# Patient Record
Sex: Female | Born: 1955 | Race: White | Hispanic: No | Smoking: Never smoker
Health system: Southern US, Community
[De-identification: ages and names within clinical notes are randomized; demographics above are authoritative.]

## PROBLEM LIST (undated history)

## (undated) DIAGNOSIS — R519 Headache, unspecified: Secondary | ICD-10-CM

## (undated) DIAGNOSIS — C569 Malignant neoplasm of unspecified ovary: Secondary | ICD-10-CM

## (undated) DIAGNOSIS — H33031 Retinal detachment with giant retinal tear, right eye: Secondary | ICD-10-CM

## (undated) DIAGNOSIS — Z8041 Family history of malignant neoplasm of ovary: Secondary | ICD-10-CM

## (undated) DIAGNOSIS — H269 Unspecified cataract: Secondary | ICD-10-CM

## (undated) DIAGNOSIS — T7840XA Allergy, unspecified, initial encounter: Secondary | ICD-10-CM

## (undated) DIAGNOSIS — G43909 Migraine, unspecified, not intractable, without status migrainosus: Secondary | ICD-10-CM

## (undated) DIAGNOSIS — E0590 Thyrotoxicosis, unspecified without thyrotoxic crisis or storm: Secondary | ICD-10-CM

## (undated) DIAGNOSIS — R188 Other ascites: Secondary | ICD-10-CM

## (undated) DIAGNOSIS — R51 Headache: Secondary | ICD-10-CM

## (undated) DIAGNOSIS — Z5189 Encounter for other specified aftercare: Secondary | ICD-10-CM

## (undated) DIAGNOSIS — C44622 Squamous cell carcinoma of skin of right upper limb, including shoulder: Secondary | ICD-10-CM

## (undated) HISTORY — DX: Malignant neoplasm of unspecified ovary: C56.9

## (undated) HISTORY — DX: Family history of malignant neoplasm of ovary: Z80.41

## (undated) HISTORY — DX: Squamous cell carcinoma of skin of right upper limb, including shoulder: C44.622

## (undated) HISTORY — DX: Migraine, unspecified, not intractable, without status migrainosus: G43.909

## (undated) HISTORY — DX: Allergy, unspecified, initial encounter: T78.40XA

## (undated) HISTORY — DX: Encounter for other specified aftercare: Z51.89

## (undated) HISTORY — PX: OTHER SURGICAL HISTORY: SHX169

## (undated) HISTORY — DX: Unspecified cataract: H26.9

## (undated) HISTORY — DX: Retinal detachment with giant retinal tear, right eye: H33.031

---

## 1998-03-27 HISTORY — PX: BREAST ENHANCEMENT SURGERY: SHX7

## 1999-03-28 HISTORY — PX: AUGMENTATION MAMMAPLASTY: SUR837

## 2004-03-27 DIAGNOSIS — C44622 Squamous cell carcinoma of skin of right upper limb, including shoulder: Secondary | ICD-10-CM

## 2004-03-27 HISTORY — DX: Squamous cell carcinoma of skin of right upper limb, including shoulder: C44.622

## 2008-03-27 DIAGNOSIS — H33031 Retinal detachment with giant retinal tear, right eye: Secondary | ICD-10-CM

## 2008-03-27 HISTORY — DX: Retinal detachment with giant retinal tear, right eye: H33.031

## 2012-03-04 ENCOUNTER — Ambulatory Visit: Payer: BC Managed Care – PPO | Admitting: Family Medicine

## 2012-03-04 ENCOUNTER — Ambulatory Visit: Payer: BC Managed Care – PPO

## 2012-03-04 VITALS — BP 106/71 | HR 103 | Temp 97.9°F | Resp 16 | Ht 68.0 in | Wt 144.0 lb

## 2012-03-04 DIAGNOSIS — R0600 Dyspnea, unspecified: Secondary | ICD-10-CM

## 2012-03-04 DIAGNOSIS — R059 Cough, unspecified: Secondary | ICD-10-CM

## 2012-03-04 DIAGNOSIS — J45909 Unspecified asthma, uncomplicated: Secondary | ICD-10-CM

## 2012-03-04 DIAGNOSIS — R0609 Other forms of dyspnea: Secondary | ICD-10-CM

## 2012-03-04 DIAGNOSIS — R0989 Other specified symptoms and signs involving the circulatory and respiratory systems: Secondary | ICD-10-CM

## 2012-03-04 DIAGNOSIS — R05 Cough: Secondary | ICD-10-CM

## 2012-03-04 MED ORDER — AZITHROMYCIN 250 MG PO TABS: ORAL_TABLET | ORAL | Status: DC

## 2012-03-04 MED ORDER — PREDNISONE 20 MG PO TABS: ORAL_TABLET | ORAL | Status: DC

## 2012-03-04 MED ORDER — ALBUTEROL SULFATE HFA 108 (90 BASE) MCG/ACT IN AERS: INHALATION_SPRAY | RESPIRATORY_TRACT | Status: DC

## 2012-03-04 MED ORDER — ALBUTEROL SULFATE (2.5 MG/3ML) 0.083% IN NEBU
2.5000 mg | INHALATION_SOLUTION | Freq: Once | RESPIRATORY_TRACT | Status: AC
  Administered 2012-03-04: 2.5 mg via RESPIRATORY_TRACT

## 2012-03-04 NOTE — Patient Instructions (Signed)
Drink plenty of fluids  Avoid smoky testing environments  Use the inhaler 2 puffs every 4-6 hours as directed. Try to remember how instructed you to use it.  Take the prednisone in the mornings in a tapered dose fashion as ordered  If abruptly worse at anytime go to the emergency room or return here

## 2012-03-04 NOTE — Progress Notes (Addendum)
Subjective: 56 year old lady with a history of a respiratory problem over the last 2 months. She's had a persistent cough and shortness of breath. She seems to do a little bit better than gets worse again. She's not been able to be very physically because of the shortness of breath. She coughs up phlegm, but it is usually not purulent looking. She does ride a bicycle, but has not been able to do so because she is not feeling well. Her roommate has not been ill. Her coworkers have had the problem have gotten over it. She has not. She is a nonsmoker, and has otherwise been pretty healthy.  Objective: O2 sat was 94%. She does keep coughing while she is sitting here in the exam room. Her TMs are normal. Throat clear. Neck supple without significant nodes. Chest has somewhat diminished air exchange. There is in expiratory wheeze. Heart was regular without any murmurs. No rhonchi or rales were noted.  Assessment: Asthmatic bronchitis  Plan: Peak flow on her and she did to 290, 300, 290. Albuterol treatment Chest x-ray  The nebulizer helped. She agreed to a peak flow of 350. Her chest is not wheezing.  Will treat for the asthmatic bronchitis.   03/07/12 Failed to view xray yesterday when I treated her.  [facility] reading (PRIMARY) by  Dr. Alwyn Ren Bilateral pleural effusions.  Await radiology reading..  I tried to call the patient several times tonight, and left a message on his machine. SHe needs to her about the abnormal chest x-ray. I think she needs to be referred to a pulmonologist.  .

## 2012-03-06 ENCOUNTER — Ambulatory Visit (INDEPENDENT_AMBULATORY_CARE_PROVIDER_SITE_OTHER): Payer: BC Managed Care – PPO | Admitting: Family Medicine

## 2012-03-06 ENCOUNTER — Ambulatory Visit: Payer: BC Managed Care – PPO

## 2012-03-06 VITALS — BP 108/76 | HR 92 | Temp 97.4°F | Resp 18 | Ht 68.0 in | Wt 143.6 lb

## 2012-03-06 DIAGNOSIS — J90 Pleural effusion, not elsewhere classified: Secondary | ICD-10-CM

## 2012-03-06 DIAGNOSIS — J40 Bronchitis, not specified as acute or chronic: Secondary | ICD-10-CM

## 2012-03-06 NOTE — Patient Instructions (Addendum)
See Dr. Sherene Sires tomorrow at his office as scheduled.

## 2012-03-06 NOTE — Progress Notes (Signed)
Subjective: Patient is in here for a followup from her visit 2 days ago. She came in with shortness of breath gradually worsening over the past 2 months. She is normally been a very healthy lady. Not any other major illnesses. No major surgery with bilateral breast implants. She is a nonsmoker, though she has lived with smokers. She's not had any chest pain, but when she bends forward she feels more short of breath. She feels like she's had a lot of mucus. I called her to inform her that a chest x-ray showed bilateral pleural effusions. I was tied up with an emergency at the time that I saw her 2 days ago, and has failed to look at her chest x-ray at the time that it was taken. She's felt a little bit better on the medications, which include Zithromax, prednisone, and albuterol inhaler.  Objective: Pleasant healthy-appearing constantly clearing her throat. Chest is not woozy today but has diminished breath sounds at the bases. Heart regular without murmurs.  [facility] reading (PRIMARY) by  Dr. Alwyn Ren Bilateral pleural effusion  .  Assessment: Bilateral pleural effusion etiology unclear  Plan: Spoke with Dr. Delford Field, pulmonologist, and he will arrange an appointment for her to see Dr. Sherene Sires tomorrow.

## 2012-03-07 ENCOUNTER — Ambulatory Visit (INDEPENDENT_AMBULATORY_CARE_PROVIDER_SITE_OTHER): Payer: BC Managed Care – PPO | Admitting: Internal Medicine

## 2012-03-07 ENCOUNTER — Other Ambulatory Visit (INDEPENDENT_AMBULATORY_CARE_PROVIDER_SITE_OTHER): Payer: BC Managed Care – PPO

## 2012-03-07 ENCOUNTER — Encounter: Payer: Self-pay | Admitting: Internal Medicine

## 2012-03-07 VITALS — BP 98/60 | HR 91 | Temp 98.0°F | Ht 67.0 in | Wt 148.0 lb

## 2012-03-07 DIAGNOSIS — J90 Pleural effusion, not elsewhere classified: Secondary | ICD-10-CM

## 2012-03-07 DIAGNOSIS — E039 Hypothyroidism, unspecified: Secondary | ICD-10-CM | POA: Insufficient documentation

## 2012-03-07 LAB — BASIC METABOLIC PANEL
BUN: 20 mg/dL (ref 6–23)
Chloride: 103 mEq/L (ref 96–112)
Glucose, Bld: 85 mg/dL (ref 70–99)
Potassium: 4.5 mEq/L (ref 3.5–5.1)

## 2012-03-07 LAB — CBC WITH DIFFERENTIAL/PLATELET
Basophils Absolute: 0 10*3/uL (ref 0.0–0.1)
Eosinophils Absolute: 0 10*3/uL (ref 0.0–0.7)
HCT: 38.8 % (ref 36.0–46.0)
Hemoglobin: 13.1 g/dL (ref 12.0–15.0)
Lymphs Abs: 2 10*3/uL (ref 0.7–4.0)
MCHC: 33.7 g/dL (ref 30.0–36.0)
Neutro Abs: 6.5 10*3/uL (ref 1.4–7.7)
RDW: 14.6 % (ref 11.5–14.6)

## 2012-03-07 LAB — HEPATIC FUNCTION PANEL
AST: 22 U/L (ref 0–37)
Albumin: 4.1 g/dL (ref 3.5–5.2)

## 2012-03-07 LAB — TSH: TSH: 5.96 u[IU]/mL — ABNORMAL HIGH (ref 0.35–5.50)

## 2012-03-07 MED ORDER — TRAMADOL HCL 50 MG PO TABS: ORAL_TABLET | ORAL | Status: DC

## 2012-03-07 NOTE — Assessment & Plan Note (Signed)
TSH 5.96 03/07/2012 > start synthroid 50 mcg daily

## 2012-03-07 NOTE — Patient Instructions (Addendum)
Take delsym two tsp every 12 hours and supplement if needed with  tramadol 50 mg up to 1 every 4 hours to suppress the urge to cough. Swallowing water or using ice chips/non mint and menthol containing candies (such as lifesavers or sugarless jolly ranchers) are also effective.  You should rest your voice and avoid activities that you know make you cough.  Once you have eliminated the cough for 3 straight days try reducing the tramadol first,  then the delsym as tolerated.    Please remember to go to the lab and x-ray department downstairs for your tests - we will call you with the results when they are available.     Late add tsh 5.96 > rx synthroid 50 mcg per day

## 2012-03-07 NOTE — Assessment & Plan Note (Signed)
-   ESR 15/ bnp 75/TSH 5.96 > doubt she is hypothyroid enough to cause bilateral effusions but it is in the ddx  Discussed in detail all the  indications, usual  risks and alternatives  relative to the benefits with patient who agrees to proceed with tap the larger side and in meantime start synthroid 50 mcg daily

## 2012-03-07 NOTE — Progress Notes (Signed)
  Subjective:    Patient ID: Shelley Sanchez, female    DOB: [birth year]  MRN: [id number]  HPI  72 yowf never smoker with excellent baseline activity tolerance able to cross biking in Sept of 2013  referred to pulmonary clinic 03/07/2012 by Dr Alwyn Ren at [facility] for eval of cough and abn cxr with bilateral effusions  03/07/2012 1st pulmonary eval cc new onset around 10th oct 2013  fatigue,  Severe cough > clear mucus then back and R side cp.  Symptoms have progressed minimally. No classic orthopnea or pnd or any leg swelling, min doe though not very active.  rx with albuterol, zmax and prednisone x 2 days prior to OV  > no better  Sleeping ok without nocturnal  or early am exacerbation  of respiratory  c/o's or need for noct saba. Also denies any obvious fluctuation of symptoms with weather or environmental changes or other aggravating or alleviating factors except as outlined above     Review of Systems  Constitutional: Negative for fever, chills and unexpected weight change.  HENT: Negative for ear pain, nosebleeds, congestion, sore throat, rhinorrhea, sneezing, trouble swallowing, dental problem, voice change, postnasal drip and sinus pressure.   Eyes: Negative for visual disturbance.  Respiratory: Positive for cough and shortness of breath. Negative for choking.   Cardiovascular: Positive for chest pain. Negative for leg swelling.  Gastrointestinal: Negative for vomiting, abdominal pain and diarrhea.  Genitourinary: Negative for difficulty urinating.  Musculoskeletal: Negative for arthralgias.  Skin: Negative for rash.  Neurological: Negative for tremors, syncope and headaches.  Hematological: Does not bruise/bleed easily.       Objective:   Physical Exam  amb wf nad   Wt Readings from Last 3 Encounters:  03/07/12 148 lb (67.132 kg)  03/06/12 143 lb 9.6 oz (65.137 kg)  03/04/12 144 lb (65.318 kg)    HEENT: nl dentition, turbinates, and orophanx. Nl external ear canals without cough  reflex   NECK :  without JVD/Nodes/TM/ nl carotid upstrokes bilaterally   LUNGS: no acc muscle use, clear to A and P bilaterally without cough on insp or exp maneuvers, min dullness in both bases, no wheeze at all   CV:  RRR  no s3 or murmur or increase in P2, no edema   ABD:  soft and nontender with nl excursion in the supine position. No bruits or organomegaly, bowel sounds nl  MS:  warm without deformities, calf tenderness, cyanosis or clubbing  SKIN: warm and dry without lesions    NEURO:  alert, approp, no deficits   cxr 03/06/12  ? Bilateral effusions     Assessment & Plan:

## 2012-03-08 ENCOUNTER — Other Ambulatory Visit: Payer: Self-pay | Admitting: Internal Medicine

## 2012-03-08 ENCOUNTER — Telehealth: Payer: Self-pay | Admitting: Internal Medicine

## 2012-03-08 DIAGNOSIS — J90 Pleural effusion, not elsewhere classified: Secondary | ICD-10-CM

## 2012-03-08 MED ORDER — LEVOTHYROXINE SODIUM 50 MCG PO TABS: 50.0000 ug | ORAL_TABLET | Freq: Every day | ORAL | Status: DC

## 2012-03-08 NOTE — Telephone Encounter (Signed)
Per 12.12.13 lab results:  Result Notes     Notes Recorded by Christen Butter, CMA on 03/07/2012 at 5:10 PM [facility] ------  Notes Recorded by Nyoka Cowden, MD on 03/07/2012 at 5:01 PM Call patient : Studies are unremarkable x tsh high so thyroid not adequate > rx synthroid 50 mcg per day and recheck in one month Also turned in request for IR to tap the larger of the two sides for relief of sob and to shed further light on cause of fluid   Called spoke with patient, advised of lab results/recs as stated by MW.  Pt okay with these recommendations and verbalized her understanding.  synthroid sent to verified pharmacy.  Pt also asked about her thoracentesis on 12.16.13 > where to check, arrival time, etc > PCCs unavailable to ask.  Per pt can call back and leave detailed message.  Spoke with Bjorn Loser [facility] > check in at [facility] dept; arrive at least 15 mins prior, may drive herself and can eat a light breakfast.  Detailed message left as per pt's request and advised her to please call if anything further is needed.

## 2012-03-11 ENCOUNTER — Ambulatory Visit (HOSPITAL_COMMUNITY)
Admission: RE | Admit: 2012-03-11 | Discharge: 2012-03-11 | Disposition: A | Discharge status: Home / Self Care | Payer: BC Managed Care – PPO | Source: Ambulatory Visit | Attending: Internal Medicine | Admitting: Internal Medicine

## 2012-03-11 ENCOUNTER — Encounter: Payer: Self-pay | Admitting: Internal Medicine

## 2012-03-11 ENCOUNTER — Ambulatory Visit (HOSPITAL_COMMUNITY)
Admission: RE | Admit: 2012-03-11 | Discharge: 2012-03-11 | Disposition: A | Discharge status: Home / Self Care | Payer: BC Managed Care – PPO | Source: Ambulatory Visit | Attending: Radiology | Admitting: Radiology

## 2012-03-11 VITALS — BP 97/68

## 2012-03-11 DIAGNOSIS — R0989 Other specified symptoms and signs involving the circulatory and respiratory systems: Secondary | ICD-10-CM | POA: Insufficient documentation

## 2012-03-11 DIAGNOSIS — J90 Pleural effusion, not elsewhere classified: Secondary | ICD-10-CM

## 2012-03-11 DIAGNOSIS — R0609 Other forms of dyspnea: Secondary | ICD-10-CM | POA: Insufficient documentation

## 2012-03-11 LAB — BODY FLUID CELL COUNT WITH DIFFERENTIAL
Eos, Fluid: 0 %
Lymphs, Fluid: 63 %
Neutrophil Count, Fluid: 4 % (ref 0–25)
Other Cells, Fluid: 0 %

## 2012-03-11 LAB — GLUCOSE, SEROUS FLUID: Glucose, Fluid: 108 mg/dL

## 2012-03-11 LAB — PROTEIN, BODY FLUID: Total protein, fluid: 5 g/dL

## 2012-03-11 NOTE — Procedures (Signed)
US guided diagnostic/therapeutic right thoracentesis performed yielding 1.6 liters turbid, amber fluid. A portion of the fluid was sent to the lab for preordered studies. F/u CXR pending. No immediate complications.  

## 2012-03-12 ENCOUNTER — Encounter: Payer: Self-pay | Admitting: Internal Medicine

## 2012-03-18 ENCOUNTER — Ambulatory Visit (INDEPENDENT_AMBULATORY_CARE_PROVIDER_SITE_OTHER)
Admission: RE | Admit: 2012-03-18 | Discharge: 2012-03-18 | Disposition: A | Discharge status: Home / Self Care | Payer: BC Managed Care – PPO | Source: Ambulatory Visit | Attending: Internal Medicine | Admitting: Internal Medicine

## 2012-03-18 ENCOUNTER — Ambulatory Visit (INDEPENDENT_AMBULATORY_CARE_PROVIDER_SITE_OTHER): Payer: BC Managed Care – PPO | Admitting: Internal Medicine

## 2012-03-18 ENCOUNTER — Encounter: Payer: Self-pay | Admitting: Internal Medicine

## 2012-03-18 VITALS — BP 100/60 | HR 92 | Temp 97.3°F | Ht 67.0 in | Wt 145.0 lb

## 2012-03-18 DIAGNOSIS — J90 Pleural effusion, not elsewhere classified: Secondary | ICD-10-CM

## 2012-03-18 DIAGNOSIS — E039 Hypothyroidism, unspecified: Secondary | ICD-10-CM

## 2012-03-18 NOTE — Patient Instructions (Addendum)
Please schedule a follow up office visit in 4 weeks, sooner if needed with cxr and recheck TSH - if breathing gets worse in meantime we can arrange for you to have the fluid in the right lung removed

## 2012-03-18 NOTE — Progress Notes (Signed)
  Subjective:    Patient ID: Shelley Sanchez, female    DOB: Apr 29, 1955  MRN: [id number]  HPI  24 yowf never smoker with excellent baseline activity tolerance able to cross biking in Sept of 2013  referred to pulmonary clinic 03/07/2012 by Dr Alwyn Ren at [facility] for eval of cough and abn cxr with bilateral effusions  03/07/2012 1st pulmonary eval cc new onset around 10th oct 2013  fatigue,  Severe cough > clear mucus then back and R side cp.  Symptoms have progressed minimally. No classic orthopnea or pnd or any leg swelling, min doe though not very active.  rx with albuterol, zmax and prednisone x 2 days prior to OV  > no better  rec R thoracentesis > 1.6 liter exudate/ monos/ neg cyt/ tsh slt elevated > rx synthoid 50 mcg per day  03/18/2012 f/u ov/Korion Cuevas cc still fatigue,  Cough much better, mostly in ams   Sleeping ok without nocturnal  or early am exacerbation  of respiratory  c/o's or need for noct saba. Also denies any obvious fluctuation of symptoms with weather or environmental changes or other aggravating or alleviating factors except as outlined above          Objective:   Physical Exam  amb wf nad   Wt 145 03/18/2012  Wt Readings from Last 3 Encounters:  03/07/12 148 lb (67.132 kg)  03/06/12 143 lb 9.6 oz (65.137 kg)  03/04/12 144 lb (65.318 kg)    HEENT: nl dentition, turbinates, and orophanx. Nl external ear canals without cough reflex   NECK :  without JVD/Nodes/TM/ nl carotid upstrokes bilaterally   LUNGS: no acc muscle use, clear to A and P bilaterally without cough on insp or exp maneuvers, min dullness in both bases, no wheeze at all   CV:  RRR  no s3 or murmur or increase in P2, no edema   ABD:  soft and nontender with nl excursion in the supine position. No bruits or organomegaly, bowel sounds nl  MS:  warm without deformities, calf tenderness, cyanosis or clubbing  SKIN: warm and dry without lesions    NEURO:  alert, approp, no deficits     CXR   03/18/2012 : Persistent bilateral pleural effusions with the left effusion  having decreased somewhat in volume. Bibasilar atelectasis.        Assessment & Plan:

## 2012-03-19 NOTE — Assessment & Plan Note (Signed)
TSH 5.96 03/07/2012 > started synthroid 50 mcg daily   Recheck due in 4 weeks

## 2012-03-19 NOTE — Assessment & Plan Note (Signed)
-   ESR 15/ bnp 75 03/07/2012  - Thoracentesis  R 03/07/2012 > 1.6 liters of turbid,amber fluid/ exudative/monos> cytology neg  cxr clearly improved even on L side with hypothyroidism all that turned up ? Related   Discussed in detail all the  indications, usual  risks and alternatives  relative to the benefits with patient who agrees to proceed with conservative f/u.

## 2012-03-27 DIAGNOSIS — R188 Other ascites: Secondary | ICD-10-CM

## 2012-03-27 HISTORY — DX: Other ascites: R18.8

## 2012-03-28 ENCOUNTER — Encounter: Payer: Self-pay | Admitting: Pulmonary Disease

## 2012-03-28 ENCOUNTER — Other Ambulatory Visit (INDEPENDENT_AMBULATORY_CARE_PROVIDER_SITE_OTHER): Payer: BC Managed Care – PPO

## 2012-03-28 ENCOUNTER — Ambulatory Visit (INDEPENDENT_AMBULATORY_CARE_PROVIDER_SITE_OTHER)
Admission: RE | Admit: 2012-03-28 | Discharge: 2012-03-28 | Disposition: A | Discharge status: Home / Self Care | Payer: BC Managed Care – PPO | Source: Ambulatory Visit | Attending: Pulmonary Disease | Admitting: Pulmonary Disease

## 2012-03-28 ENCOUNTER — Ambulatory Visit (INDEPENDENT_AMBULATORY_CARE_PROVIDER_SITE_OTHER): Payer: BC Managed Care – PPO | Admitting: Pulmonary Disease

## 2012-03-28 ENCOUNTER — Telehealth: Payer: Self-pay | Admitting: Internal Medicine

## 2012-03-28 VITALS — BP 110/62 | HR 124 | Temp 97.2°F | Ht 67.0 in | Wt 146.4 lb

## 2012-03-28 DIAGNOSIS — J90 Pleural effusion, not elsewhere classified: Secondary | ICD-10-CM

## 2012-03-28 LAB — COMPREHENSIVE METABOLIC PANEL
ALT: 10 U/L (ref 0–35)
CO2: 29 mEq/L (ref 19–32)
Creatinine, Ser: 0.8 mg/dL (ref 0.4–1.2)
GFR: 80.84 mL/min (ref >60.00)
Total Bilirubin: 0.7 mg/dL (ref 0.3–1.2)

## 2012-03-28 NOTE — Telephone Encounter (Signed)
Spoke with pt She reports having increased SOB and chest congestion since last visit Was unable to work today b/c so SOB OV with RA this am at 12 noon

## 2012-03-28 NOTE — Patient Instructions (Signed)
CT chest & abdomen Blood work

## 2012-03-28 NOTE — Assessment & Plan Note (Signed)
Recurrent BL effusions - lymphocytic exudate, no evidence of heart failure or systemic inflammation DD includes Serositis & ovarian tumor given her vague abdominal complaints. Will pursue CT chest/ abdomen-pelvis with PO contrast  & inflammatory markers ANA, RA factor - note no arthritis or ski rash to suggest lupus here. Imaging will also help to assess for pericarditis & ovarian pathology if any. FU in a few days or sooner,  if symptoms get worse

## 2012-03-28 NOTE — Progress Notes (Signed)
  Subjective:    Patient ID: Shelley Sanchez, female    DOB: 09-24-1955, 57 y.o.   MRN: [id number]  HPI 61 yowf never smoker with excellent baseline activity tolerance able to cross biking in Sept of 2013 referred to pulmonary clinic 03/07/2012 by Dr Alwyn Ren at [facility] for eval of cough and abn cxr with bilateral effusions  12/16 R thoracentesis > 1.6 liter exudate/ monos/ neg cyt/ tsh slt elevated > rx synthoid 50 mcg per day   03/28/2012 She reports having increased SOB and chest congestion since last visit 1 wk ago   c/o increase SOB, cough w/ white-clear phlem, chest congestion, back pain, "mucus" in bowel movements.  Clear mucus Stomach protrusion & distended Incontinent with occasional loose stools - no abdominal pain or fevers CXR shows recurrent effusion on rt & unchanged left   Past Medical History  Diagnosis Date  . Skin cancer     Review of Systems neg for any significant sore throat, dysphagia, itching, sneezing, nasal congestion or excess/ purulent secretions, fever, chills, sweats, unintended wt loss, pleuritic or exertional cp, hempoptysis, orthopnea pnd or change in chronic leg swelling. Also denies presyncope, palpitations, heartburn, abdominal pain, nausea, vomiting, diarrhea or change in bowel or urinary habits, dysuria,hematuria, rash, arthralgias, visual complaints, headache, numbness weakness or ataxia.     Objective:   Physical Exam  Gen. Pleasant, well-nourished, in no distress, normal affect ENT - no lesions, no post nasal drip Neck: No JVD, no thyromegaly, no carotid bruits Lungs: no use of accessory muscles, no dullness to percussion, decreased BL without rales or rhonchi  Cardiovascular: Rhythm regular, heart sounds  normal, no murmurs or gallops, no peripheral edema Abdomen: soft and non-tender,protuberant, tympanic, no hepatosplenomegaly, BS normal. Musculoskeletal: No deformities, no cyanosis or clubbing Neuro:  alert, non focal       Assessment & Plan:

## 2012-03-28 NOTE — Telephone Encounter (Signed)
[email or web address]  

## 2012-04-01 ENCOUNTER — Ambulatory Visit (INDEPENDENT_AMBULATORY_CARE_PROVIDER_SITE_OTHER)
Admission: RE | Admit: 2012-04-01 | Discharge: 2012-04-01 | Disposition: A | Discharge status: Home / Self Care | Payer: BC Managed Care – PPO | Source: Ambulatory Visit | Attending: Pulmonary Disease | Admitting: Pulmonary Disease

## 2012-04-01 ENCOUNTER — Encounter: Payer: Self-pay | Admitting: Internal Medicine

## 2012-04-01 ENCOUNTER — Telehealth: Payer: Self-pay | Admitting: Pulmonary Disease

## 2012-04-01 ENCOUNTER — Encounter (HOSPITAL_COMMUNITY): Payer: Self-pay | Admitting: Emergency Medicine

## 2012-04-01 ENCOUNTER — Ambulatory Visit
Admission: RE | Admit: 2012-04-01 | Discharge: 2012-04-01 | Disposition: A | Discharge status: Home / Self Care | Payer: BC Managed Care – PPO | Source: Ambulatory Visit | Attending: Pulmonary Disease | Admitting: Pulmonary Disease

## 2012-04-01 ENCOUNTER — Inpatient Hospital Stay (HOSPITAL_COMMUNITY)
Admission: EM | Admit: 2012-04-01 | Discharge: 2012-04-06 | DRG: 085 | Disposition: A | Discharge status: Home / Self Care | Payer: BC Managed Care – PPO | Attending: Internal Medicine | Admitting: Internal Medicine

## 2012-04-01 ENCOUNTER — Emergency Department (HOSPITAL_COMMUNITY): Payer: BC Managed Care – PPO

## 2012-04-01 DIAGNOSIS — G244 Idiopathic orofacial dystonia: Secondary | ICD-10-CM | POA: Diagnosis present

## 2012-04-01 DIAGNOSIS — R188 Other ascites: Secondary | ICD-10-CM | POA: Insufficient documentation

## 2012-04-01 DIAGNOSIS — E039 Hypothyroidism, unspecified: Secondary | ICD-10-CM | POA: Diagnosis present

## 2012-04-01 DIAGNOSIS — I498 Other specified cardiac arrhythmias: Secondary | ICD-10-CM | POA: Diagnosis present

## 2012-04-01 DIAGNOSIS — E876 Hypokalemia: Secondary | ICD-10-CM | POA: Diagnosis present

## 2012-04-01 DIAGNOSIS — C569 Malignant neoplasm of unspecified ovary: Secondary | ICD-10-CM | POA: Diagnosis present

## 2012-04-01 DIAGNOSIS — J90 Pleural effusion, not elsewhere classified: Secondary | ICD-10-CM

## 2012-04-01 DIAGNOSIS — R1900 Intra-abdominal and pelvic swelling, mass and lump, unspecified site: Secondary | ICD-10-CM

## 2012-04-01 DIAGNOSIS — R0600 Dyspnea, unspecified: Secondary | ICD-10-CM | POA: Diagnosis present

## 2012-04-01 DIAGNOSIS — J9819 Other pulmonary collapse: Secondary | ICD-10-CM | POA: Diagnosis present

## 2012-04-01 DIAGNOSIS — Z85828 Personal history of other malignant neoplasm of skin: Secondary | ICD-10-CM

## 2012-04-01 DIAGNOSIS — N838 Other noninflammatory disorders of ovary, fallopian tube and broad ligament: Secondary | ICD-10-CM

## 2012-04-01 DIAGNOSIS — K7689 Other specified diseases of liver: Secondary | ICD-10-CM | POA: Diagnosis present

## 2012-04-01 LAB — URINALYSIS, ROUTINE W REFLEX MICROSCOPIC
Bilirubin Urine: NEGATIVE
Glucose, UA: NEGATIVE mg/dL
Specific Gravity, Urine: >1.046 — ABNORMAL HIGH (ref 1.005–1.030)
pH: 5 (ref 5.0–8.0)

## 2012-04-01 LAB — COMPREHENSIVE METABOLIC PANEL
ALT: 6 U/L (ref 0–35)
AST: 20 U/L (ref 0–37)
Alkaline Phosphatase: 74 U/L (ref 39–117)
CO2: 25 mEq/L (ref 19–32)
Chloride: 94 mEq/L — ABNORMAL LOW (ref 96–112)
Creatinine, Ser: 0.82 mg/dL (ref 0.50–1.10)
GFR calc non Af Amer: 78 mL/min — ABNORMAL LOW (ref >90)
Potassium: 3.9 mEq/L (ref 3.5–5.1)
Sodium: 132 mEq/L — ABNORMAL LOW (ref 135–145)
Total Bilirubin: 0.4 mg/dL (ref 0.3–1.2)

## 2012-04-01 LAB — CBC
MCV: 87.7 fL (ref 78.0–100.0)
Platelets: 481 10*3/uL — ABNORMAL HIGH (ref 150–400)
RBC: 4.65 MIL/uL (ref 3.87–5.11)
WBC: 10 10*3/uL (ref 4.0–10.5)

## 2012-04-01 LAB — URINE MICROSCOPIC-ADD ON

## 2012-04-01 MED ORDER — MORPHINE SULFATE 2 MG/ML IJ SOLN: 2.0000 mg | INTRAMUSCULAR | Status: DC | PRN

## 2012-04-01 MED ORDER — ACETAMINOPHEN 650 MG RE SUPP: 650.0000 mg | Freq: Four times a day (QID) | RECTAL | Status: DC | PRN

## 2012-04-01 MED ORDER — OXYCODONE HCL 5 MG PO TABS: 5.0000 mg | ORAL_TABLET | ORAL | Status: DC | PRN

## 2012-04-01 MED ORDER — SODIUM CHLORIDE 0.9 % IJ SOLN
3.0000 mL | Freq: Two times a day (BID) | INTRAMUSCULAR | Status: DC
  Administered 2012-04-02 – 2012-04-05 (×5): 3 mL via INTRAVENOUS

## 2012-04-01 MED ORDER — IOHEXOL 350 MG/ML SOLN
100.0000 mL | Freq: Once | INTRAVENOUS | Status: AC | PRN
  Administered 2012-04-01: 100 mL via INTRAVENOUS

## 2012-04-01 MED ORDER — SODIUM CHLORIDE 0.9 % IV SOLN
250.0000 mL | INTRAVENOUS | Status: DC | PRN
  Administered 2012-04-01: 10 mL/h via INTRAVENOUS

## 2012-04-01 MED ORDER — SODIUM CHLORIDE 0.9 % IJ SOLN
3.0000 mL | Freq: Two times a day (BID) | INTRAMUSCULAR | Status: DC
  Administered 2012-04-01 – 2012-04-06 (×6): 3 mL via INTRAVENOUS

## 2012-04-01 MED ORDER — FUROSEMIDE 10 MG/ML IJ SOLN
40.0000 mg | Freq: Every day | INTRAMUSCULAR | Status: DC
  Administered 2012-04-01 – 2012-04-02 (×2): 40 mg via INTRAVENOUS
  Filled 2012-04-01 (×2): qty 4

## 2012-04-01 MED ORDER — HEPARIN SODIUM (PORCINE) 5000 UNIT/ML IJ SOLN
5000.0000 [IU] | Freq: Three times a day (TID) | INTRAMUSCULAR | Status: DC
  Administered 2012-04-01 – 2012-04-02 (×3): 5000 [IU] via SUBCUTANEOUS
  Filled 2012-04-01 (×5): qty 1

## 2012-04-01 MED ORDER — ONDANSETRON HCL 4 MG PO TABS: 4.0000 mg | ORAL_TABLET | Freq: Four times a day (QID) | ORAL | Status: DC | PRN

## 2012-04-01 MED ORDER — SODIUM CHLORIDE 0.9 % IJ SOLN: 3.0000 mL | INTRAMUSCULAR | Status: DC | PRN

## 2012-04-01 MED ORDER — ONDANSETRON HCL 4 MG/2ML IJ SOLN: 4.0000 mg | Freq: Four times a day (QID) | INTRAMUSCULAR | Status: DC | PRN

## 2012-04-01 MED ORDER — ACETAMINOPHEN 325 MG PO TABS: 650.0000 mg | ORAL_TABLET | Freq: Four times a day (QID) | ORAL | Status: DC | PRN

## 2012-04-01 MED ORDER — LEVOTHYROXINE SODIUM 50 MCG PO TABS
50.0000 ug | ORAL_TABLET | Freq: Every day | ORAL | Status: DC
  Administered 2012-04-02 – 2012-04-06 (×5): 50 ug via ORAL
  Filled 2012-04-01 (×6): qty 1

## 2012-04-01 NOTE — ED Provider Notes (Signed)
History     CSN: [id number]  Arrival date & time 04/01/12  1502   First MD Initiated Contact with Patient 04/01/12 1506      Chief Complaint  Patient presents with  . Shortness of Breath  . Abdominal Pain    (Consider location/radiation/quality/duration/timing/severity/associated sxs/prior treatment) HPI Comments: Shelley Sanchez presents for evaluation at the request of her primary physician.  She reports feeling tired and weak for greater than a month.  She has experienced progressively worsening dyspnea that is exacerbated by activity.  She denies fever or chills but also reports abdominal discomfort and distention that has greatly increased over the last week.  She had an outpatient CT performed this morning and was referred to the ER after the scan was interpreted.  Patient is a 57 y.o. female presenting with shortness of breath and abdominal pain. The history is provided by the patient. No language interpreter was used.  Shortness of Breath  The current episode started more than 2 weeks ago. The onset was gradual. The problem occurs continuously. The problem has been gradually worsening. The problem is moderate. The symptoms are relieved by rest. The symptoms are aggravated by activity. Associated symptoms include rhinorrhea, cough and shortness of breath. Pertinent negatives include no chest pain, no fever, no sore throat and no wheezing. There was no intake of a foreign body. She was not exposed to toxic fumes. She has not inhaled smoke recently. She has had no prior steroid use. She has had no prior hospitalizations. She has had no prior ICU admissions. She has had no prior intubations. Her past medical history does not include asthma, bronchiolitis, past wheezing, eczema or asthma in the family. She has been behaving normally. Urine output has been normal. The last void occurred less than 6 hours ago. There were no sick contacts. Recently, medical care has been given by the PCP.  Abdominal  Pain The primary symptoms of the illness include abdominal pain, fatigue, shortness of breath, nausea and diarrhea. The primary symptoms of the illness do not include fever, vomiting, dysuria, vaginal discharge or vaginal bleeding.  The patient's medical history does not include asthma.  Symptoms associated with the illness do not include chills, diaphoresis, urgency, hematuria or back pain.    Past Medical History  Diagnosis Date  . Skin cancer     Past Surgical History  Procedure Date  . Breast enhancement surgery 2000    Family History  Problem Relation Age of Onset  . Melanoma Mother   . Skin cancer Brother     History  Substance Use Topics  . Smoking status: Never Smoker   . Smokeless tobacco: Never Used  . Alcohol Use: No    OB History    Grav Para Term Preterm Abortions TAB SAB Ect Mult Living                  Review of Systems  Constitutional: Positive for appetite change and fatigue. Negative for fever, chills, diaphoresis and activity change.  HENT: Positive for congestion and rhinorrhea. Negative for nosebleeds, sore throat, facial swelling, trouble swallowing, neck stiffness and postnasal drip.   Eyes: Negative for pain and visual disturbance.  Respiratory: Positive for cough and shortness of breath. Negative for chest tightness and wheezing.   Cardiovascular: Negative for chest pain.  Gastrointestinal: Positive for nausea, abdominal pain, diarrhea and abdominal distention. Negative for vomiting.  Genitourinary: Negative for dysuria, urgency, hematuria, vaginal bleeding, vaginal discharge and enuresis.  Musculoskeletal: Negative for myalgias and  back pain.  Skin: Negative for pallor, rash and wound.  Neurological: Negative for dizziness, tremors, seizures, weakness, light-headedness and headaches.  Hematological: Negative for adenopathy. Does not bruise/bleed easily.    Allergies  Review of patient's allergies indicates no known allergies.  Home  Medications   Current Outpatient Rx  Name  Route  Sig  Dispense  Refill  . LEVOTHYROXINE SODIUM 50 MCG PO TABS   Oral   Take 1 tablet (50 mcg total) by mouth daily.   30 tablet   3     BP 142/70  Pulse 130  Temp 98.6 F (37 C) (Oral)  Resp 18  SpO2 93%  Physical Exam  Nursing note and vitals reviewed. Constitutional: She is oriented to person, place, and time. She appears well-developed and well-nourished. No distress. She is not intubated.  HENT:  Head: Normocephalic and atraumatic.  Right Ear: External ear normal.  Left Ear: External ear normal.  Nose: Nose normal.  Mouth/Throat: Oropharynx is clear and moist.  Eyes: Conjunctivae normal and EOM are normal. Pupils are equal, round, and reactive to light. Right eye exhibits no discharge. Left eye exhibits no discharge. No scleral icterus.  Neck: Normal range of motion. Neck supple. No JVD present. No tracheal deviation present.  Cardiovascular: S1 normal, S2 normal, normal heart sounds, intact distal pulses and normal pulses.  PMI is not displaced.  Exam reveals no gallop and no friction rub.   No murmur heard. Pulmonary/Chest: Effort normal. No stridor. No apnea and not bradypneic. She is not intubated. No respiratory distress. She has decreased breath sounds (greatly disminished at the bases ) in the right lower field and the left lower field. She has no wheezes. She has no rhonchi. She has no rales. She exhibits no tenderness.  Abdominal: Soft. She exhibits distension (mild). She exhibits no mass. There is tenderness (vague, diffuse). There is no rebound and no guarding.  Musculoskeletal: Normal range of motion. She exhibits no edema and no tenderness.  Lymphadenopathy:    She has no cervical adenopathy.  Neurological: She is alert and oriented to person, place, and time. No cranial nerve deficit.  Skin: Skin is warm and dry. No rash noted. She is not diaphoretic. No erythema. No pallor.  Psychiatric: She has a normal mood  and affect. Her behavior is normal.    ED Course  Procedures (including critical care time)   Labs Reviewed  CBC  COMPREHENSIVE METABOLIC PANEL  PROTIME-INR  APTT  URINALYSIS, ROUTINE W REFLEX MICROSCOPIC   Ct Abdomen Pelvis Wo Contrast  04/01/2012  *RADIOLOGY REPORT*  Clinical Data:  Pleural effusions.  Bloating.  Increasing shortness of breath.  Evaluate for ovarian pathology.  CT CHEST, ABDOMEN AND PELVIS WITHOUT CONTRAST  Technique:  Multidetector CT imaging of the chest, abdomen and pelvis was performed following the standard protocol without IV contrast.  Comparison:  No priors.  CT CHEST  Findings:  Mediastinum: Heart size is normal. There is no significant pericardial fluid, thickening or pericardial calcification. No pathologically enlarged mediastinal or hilar lymph nodes. Please note that accurate exclusion of hilar adenopathy is limited on noncontrast CT scans.  Esophagus is unremarkable in appearance.  Lungs/Pleura: Large bilateral pleural effusions.  On the noncontrast images obtained, there is no definite pleural nodule or mass, however, assessment is limited without IV contrast.  These effusions are associated with extensive passive atelectasis in the dependent portions of the lungs, including near complete atelectasis of both lower lobes.  There is also some atelectasis in  the right middle lobe.  Musculoskeletal: There are no aggressive appearing lytic or blastic lesions noted in the visualized portions of the skeleton. Bilateral breast implants are incidentally noted.  IMPRESSION:  1.  Large bilateral pleural effusions with extensive passive atelectasis in both lungs.  CT ABDOMEN AND PELVIS  Findings:  Abdomen/Pelvis: 3.7 x 3.1 cm low attenuation lesion in segment 7 of the liver.  Smaller subcentimeter low attenuation lesion is also noted in segment 7 of the liver (image 53 of series 2).  There is some high attenuation material layering dependently in the lumen of the gallbladder,  likely represent biliary sludge.  The appearance of the pancreas, spleen, bilateral adrenal glands and bilateral kidneys is unremarkable on this noncontrast CT examination.  There is a large volume of ascites throughout the peritoneal cavity.  In the pelvis there is a complex heterogeneous appearing soft tissue mass that appears to have multiple internal cystic components and some small amounts of calcium.  This lesion is irregular in shape and therefore difficult to accurately measure, however, it measures at least 12.5 x 12.7 x 11.8 cm. Although this comes in close proximity to the patient's uterus, the lesion is favored to be separate from the uterus, likely of ovarian origin. No pneumoperitoneum.  No pathologic distension of small bowel.  Musculoskeletal: There are no aggressive appearing lytic or blastic lesions noted in the visualized portions of the skeleton.  IMPRESSION:  1.  Large mass in the pelvis measuring approximately 12.5 x 12.7 x 1.8 cm, presumably a large ovarian malignancy.  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 2.  There are two liver lesions in the right lobe of the liver, as above, which are incompletely characterized on this noncontrast CT examination. 3.  Probable biliary sludge layering dependently in the gallbladder.   Original Report Authenticated By: Trudie Reed, M.D.    Ct Chest Wo Contrast  04/01/2012  *RADIOLOGY REPORT*  Clinical Data:  Pleural effusions.  Bloating.  Increasing shortness of breath.  Evaluate for ovarian pathology.  CT CHEST, ABDOMEN AND PELVIS WITHOUT CONTRAST  Technique:  Multidetector CT imaging of the chest, abdomen and pelvis was performed following the standard protocol without IV contrast.  Comparison:  No priors.  CT CHEST  Findings:  Mediastinum: Heart size is normal. There is no significant pericardial fluid, thickening or pericardial calcification. No pathologically enlarged mediastinal or hilar lymph nodes. Please  note that accurate exclusion of hilar adenopathy is limited on noncontrast CT scans.  Esophagus is unremarkable in appearance.  Lungs/Pleura: Large bilateral pleural effusions.  On the noncontrast images obtained, there is no definite pleural nodule or mass, however, assessment is limited without IV contrast.  These effusions are associated with extensive passive atelectasis in the dependent portions of the lungs, including near complete atelectasis of both lower lobes.  There is also some atelectasis in the right middle lobe.  Musculoskeletal: There are no aggressive appearing lytic or blastic lesions noted in the visualized portions of the skeleton. Bilateral breast implants are incidentally noted.  IMPRESSION:  1.  Large bilateral pleural effusions with extensive passive atelectasis in both lungs.  CT ABDOMEN AND PELVIS  Findings:  Abdomen/Pelvis: 3.7 x 3.1 cm low attenuation lesion in segment 7 of the liver.  Smaller subcentimeter low attenuation lesion is also noted in segment 7 of the liver (image 53 of series 2).  There is some high attenuation material layering dependently in the lumen of the gallbladder, likely represent biliary sludge.  The appearance of the pancreas, spleen, bilateral adrenal glands and bilateral kidneys is unremarkable on this noncontrast CT examination.  There is a large volume of ascites throughout the peritoneal cavity.  In the pelvis there is a complex heterogeneous appearing soft tissue mass that appears to have multiple internal cystic components and some small amounts of calcium.  This lesion is irregular in shape and therefore difficult to accurately measure, however, it measures at least 12.5 x 12.7 x 11.8 cm. Although this comes in close proximity to the patient's uterus, the lesion is favored to be separate from the uterus, likely of ovarian origin. No pneumoperitoneum.  No pathologic distension of small bowel.  Musculoskeletal: There are no aggressive appearing lytic or  blastic lesions noted in the visualized portions of the skeleton.  IMPRESSION:  1.  Large mass in the pelvis measuring approximately 12.5 x 12.7 x 1.8 cm, presumably a large ovarian malignancy.  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 2.  There are two liver lesions in the right lobe of the liver, as above, which are incompletely characterized on this noncontrast CT examination. 3.  Probable biliary sludge layering dependently in the gallbladder.   Original Report Authenticated By: Trudie Reed, M.D.      No diagnosis found.   Date: 04/01/2012  Rate: 122 bpm  Rhythm: sinus tachycardia  QRS Axis: normal  Intervals: normal  ST/T Wave abnormalities: nonspecific T wave changes  Conduction Disutrbances:note diffusely diminished voltage  Narrative Interpretation:   Old EKG Reviewed: none available      MDM  Pt presents for evaluation of SOB and abdominal fullness.  Note depressed O2 sat and elevated HR.  She is afebrile and appears nontoxic.  A CT scan performed prior to arrival demonstrates significant bilateral pleural effusions with some lung collapse, abdominal ascites, and a large pelvic mass concerning for malignancy.  Will obtain basic labs and place supplemental O2.  At this point, I am concerned that her abnormal vital signs may be secondary to the loss of lung volume secondary to the large effusions, but it may also be because of a PE.  Will obtain the appropriate study (CTA).  As more results become available, will contact the hospitalist group for admission.  1544.  Note low voltage on the EKG.  Given the large amt of ascites and pleural effusion, it is possible she may also have a pericardial effusion that may be contributing to her feeling of dyspnea.  Echocardiogram has been ordered and a consult placed to cardiology for interpretation.  1625.  Discussed her evaluation with the on-call hospitalist.  Plan admit for further mgmnt.  Supplemental O2  has been placed.  CRITICAL CARE Performed by: Dana Allan T   Total critical care time: 30  Critical care time was exclusive of separately billable procedures and treating other patients.  Critical care was necessary to treat or prevent imminent or life-threatening deterioration.  Critical care was time spent personally by me on the following activities: development of treatment plan with patient and/or surrogate as well as nursing, discussions with consultants, evaluation of patient's response to treatment, examination of patient, obtaining history from patient or surrogate, ordering and performing treatments and interventions, ordering and review of laboratory studies, ordering and review of radiographic studies, pulse oximetry and re-evaluation of patient's condition.       Tobin Chad, MD 04/01/12 [phone]

## 2012-04-01 NOTE — ED Notes (Signed)
Patient states that she is still unable to go to the bathroom

## 2012-04-01 NOTE — H&P (Signed)
PCP:   Pcp Not In System   Chief Complaint:  Progressive shortness of breath and abdominal swelling.  HPI: This is a 57 year old female, with know history of previous skin cancer s/p excision over a year ago, s/p previous augumentation mammoplasty, hypothyroidism diagnosed 02/2012, who started experiencing progressive shortness of breath on exertion, as well as painless abdominal swelling in October 2013. She was referred to pulmonary clinic 03/07/2012 by Dr Alwyn Ren at [facility] for evaluation of cough and abnormal CXR with bilateral effusions, underwent right thoracocentesis on 03/11/12, with evacuation of > 1.6 liter exudate/monos/neg cytology. She was seen again on 03/28/2012, by Dr Cyril Mourning, with worsening SOB and increasing abdominal girth. CXR revealed recurrent bilateral effusions. Dr Vassie Loll arranged CT chest/ abdomen-pelvis with PO contrast & inflammatory markers ANA, RA factor, with plans for close follow up. Patient had her Chest CT /Abdomional CT on 04/01/2012, and is being admitted for work up and management of abnormal findings. She denies fever or chills, has had a long-standing cough with clear phlegm, and although appetite is preserved, she has not been able to eat much because of abdominal discomfort and has lost about 5 lbs in the past 3 months.      Allergies:  No Known Allergies    Past Medical History  Diagnosis Date  . Skin cancer     Past Surgical History  Procedure Date  . Breast enhancement surgery 2000    Prior to Admission medications   Medication Sig Start Date End Date Taking? Authorizing Provider  ibuprofen (ADVIL,MOTRIN) 200 MG tablet Take 400 mg by mouth every 6 (six) hours as needed. For pain   Yes Historical Provider, MD  levothyroxine (SYNTHROID) 50 MCG tablet Take 1 tablet (50 mcg total) by mouth daily. 03/08/12  Yes Nyoka Cowden, MD    Social History: Patient reports that she has never smoked. She has never used smokeless tobacco. She reports that she does  not drink alcohol or use illicit drugs.  Family History  Problem Relation Age of Onset  . Melanoma Mother   . Skin cancer Brother     Review of Systems:  As per HPI and chief complaint. Patent denies fatigue, diminished appetite, has weight loss, but no fever, chills, headache, blurred vision, difficulty in speaking, dysphagia, chest pain. She has a persistent cough, progressive shortness of breath, but no orthopnea, paroxysmal nocturnal dyspnea, nausea, diaphoresis, abdominal pain, vomiting, diarrhea, belching, heartburn, hematemesis, melena, dysuria, nocturia, urinary frequency, hematochezia, lower extremity swelling, pain, or redness. The rest of the systems review is negative.  Physical Exam:  General:  Patient does not appear to be in obvious acute distress. Alert, communicative, fully oriented, talking in complete sentences, not short of breath at rest.  HEENT:  No clinical pallor, no jaundice, no conjunctival injection or discharge. Hydration status is fair.  NECK:  Supple, JVP not seen, no carotid bruits, no palpable lymphadenopathy, no palpable goiter. CHEST:  Clinically clear to auscultation, no wheezes, no crackles. HEART:  Sounds 1 and 2 heard, normal, regular, no murmurs. ABDOMEN:  Distended, tense, with a positve fluid thrill, small umbilical hernia, non-tender. Unable to palpate organs. Bowel sounds heard. GENITALIA:  Not examined. LOWER EXTREMITIES:  No pitting edema, palpable peripheral pulses. MUSCULOSKELETAL SYSTEM:  Unremarkable. CENTRAL NERVOUS SYSTEM:  No focal neurologic deficit on gross examination.  Labs on Admission:  Results for orders placed during the hospital encounter of 04/01/12 (from the past 48 hour(s))  CBC     Status: Abnormal  Collection Time   04/01/12  3:27 PM      Component Value Range Comment   WBC 10.0  4.0 - 10.5 K/uL    RBC 4.65  3.87 - 5.11 MIL/uL    Hemoglobin 13.6  12.0 - 15.0 g/dL    HCT 16.1  09.6 - 04.5 %    MCV 87.7  78.0 - 100.0 fL     MCH 29.2  26.0 - 34.0 pg    MCHC 33.3  30.0 - 36.0 g/dL    RDW 40.9  81.1 - 91.4 %    Platelets 481 (*) 150 - 400 K/uL   COMPREHENSIVE METABOLIC PANEL     Status: Abnormal   Collection Time   04/01/12  3:27 PM      Component Value Range Comment   Sodium 132 (*) 135 - 145 mEq/L    Potassium 3.9  3.5 - 5.1 mEq/L    Chloride 94 (*) 96 - 112 mEq/L    CO2 25  19 - 32 mEq/L    Glucose, Bld 100 (*) 70 - 99 mg/dL    BUN 18  6 - 23 mg/dL    Creatinine, Ser 7.82  0.50 - 1.10 mg/dL    Calcium 8.9  8.4 - 95.6 mg/dL    Total Protein 7.5  6.0 - 8.3 g/dL    Albumin 3.0 (*) 3.5 - 5.2 g/dL    AST 20  0 - 37 U/L    ALT 6  0 - 35 U/L    Alkaline Phosphatase 74  39 - 117 U/L    Total Bilirubin 0.4  0.3 - 1.2 mg/dL    GFR calc non Af Amer 78 (*) >90 mL/min    GFR calc Af Amer >90  >90 mL/min   PROTIME-INR     Status: Normal   Collection Time   04/01/12  3:27 PM      Component Value Range Comment   Prothrombin Time 14.3  11.6 - 15.2 seconds    INR 1.13  0.00 - 1.49   APTT     Status: Abnormal   Collection Time   04/01/12  3:27 PM      Component Value Range Comment   aPTT 38 (*) 24 - 37 seconds     Radiological Exams on Admission: *RADIOLOGY REPORT*  Clinical Data: Pleural effusions. Bloating. Increasing shortness  of breath. Evaluate for ovarian pathology.  CT CHEST, ABDOMEN AND PELVIS WITHOUT CONTRAST  Technique: Multidetector CT imaging of the chest, abdomen and  pelvis was performed following the standard protocol without IV  contrast.  Comparison: No priors.  CT CHEST  Findings:  Mediastinum: Heart size is normal. There is no significant  pericardial fluid, thickening or pericardial calcification. No  pathologically enlarged mediastinal or hilar lymph nodes. Please  note that accurate exclusion of hilar adenopathy is limited on  noncontrast CT scans. Esophagus is unremarkable in appearance.  Lungs/Pleura: Large bilateral pleural effusions. On the  noncontrast images obtained, there  is no definite pleural nodule or  mass, however, assessment is limited without IV contrast. These  effusions are associated with extensive passive atelectasis in the  dependent portions of the lungs, including near complete  atelectasis of both lower lobes. There is also some atelectasis in  the right middle lobe.  Musculoskeletal: There are no aggressive appearing lytic or blastic  lesions noted in the visualized portions of the skeleton.  Bilateral breast implants are incidentally noted.  IMPRESSION:  1. Large bilateral pleural effusions  with extensive passive  atelectasis in both lungs.  CT ABDOMEN AND PELVIS  Findings:  Abdomen/Pelvis: 3.7 x 3.1 cm low attenuation lesion in segment 7 of  the liver. Smaller subcentimeter low attenuation lesion is also  noted in segment 7 of the liver (image 53 of series 2). There is  some high attenuation material layering dependently in the lumen of  the gallbladder, likely represent biliary sludge. The appearance  of the pancreas, spleen, bilateral adrenal glands and bilateral  kidneys is unremarkable on this noncontrast CT examination.  There is a large volume of ascites throughout the peritoneal  cavity. In the pelvis there is a complex heterogeneous appearing  soft tissue mass that appears to have multiple internal cystic  components and some small amounts of calcium. This lesion is  irregular in shape and therefore difficult to accurately measure,  however, it measures at least 12.5 x 12.7 x 11.8 cm. Although this  comes in close proximity to the patient's uterus, the lesion is  favored to be separate from the uterus, likely of ovarian origin.  No pneumoperitoneum. No pathologic distension of small bowel.  Musculoskeletal: There are no aggressive appearing lytic or blastic  lesions noted in the visualized portions of the skeleton.  IMPRESSION:  1. Large mass in the pelvis measuring approximately 12.5 x 12.7 x  1.8 cm, presumably a large  ovarian malignancy. 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  2. There are two liver lesions in the right lobe of the liver, as  above, which are incompletely characterized on this noncontrast CT  examination.  3. Probable biliary sludge layering dependently in the  gallbladder.  Original Report Authenticated By: Trudie Reed, M.D.    Assessment/Plan Active Problems:    1. Bilateral pleural effusion: Patient initially presented to [facility] in 12/20013, was referred to Pulmonologist for abnormal CXR with bilateral pleural effusion, and is s/p right thoracocentesis with evacuation of >1.6 L fluid on 03/11/2012. Cytology was negative at that time. She now has a recurrence, shown on CXR 03/28/12, and confirmed by Chest CT scan today. As effusions are quite large, repeat thoracocentesis will likely be needed. Fortunately, there were no pericardial abnormalities. CTA is negative for PE. Will consult pulmonary,,as patient is already well known to the service.  2. Ascties/Pelvic mass: Abdominal/Pelvic CT scan, done to evaluate progressive increase in abdominal girth and clinical ascites, revealed a large mass in the pelvis measuring approximately 12.5 x 12.7 x  1.8 cm, presumably a large ovarian malignancy. 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 Also, there are two liver lesions in the right lobe of the liver, which are incompletely characterized on noncontrast CT examination. We shall arrange a diagnostic/therapeutic paracentesis by IR, check CA-125 tumor marker, and arrange consultation by GYN-Oncologist.  3. Dyspnea: This likely due to large bilateral pleural effusions and tense ascites, and will likely improve with thoracocentesis/paracentesis. Will commence diuretics for now. 2D echocardiogram has been ordered to assess LV function.  4. Hypothyroidism: Will continue pre-admission thyroxine replacement therapy.   Further  management will depend on clinical course.    Comment: Patient is FULL CODE.   Time Spent on Admission: 45 mins.   Lequan Dobratz,CHRISTOPHER 04/01/2012, 5:24 PM

## 2012-04-01 NOTE — Telephone Encounter (Signed)
I tried reaching her. Can you please let her know that CT abdomen shows fluid collection & mass in ovary. Referral has been placed to Gyn-Onc

## 2012-04-01 NOTE — ED Notes (Signed)
Patient transported to CT, aware that this nurse unable to obtain IV access, will monitor.

## 2012-04-01 NOTE — ED Notes (Signed)
Patient returned from CT

## 2012-04-01 NOTE — ED Notes (Signed)
Pt was sent by doctor for fluid on lungs.  C/o abd pain/sob/coughing

## 2012-04-01 NOTE — ED Notes (Signed)
Pt reports being unable to urinate, will monitor.

## 2012-04-01 NOTE — ED Notes (Signed)
Patient stated they can not give urine sample at this time just came back from the bathroom

## 2012-04-02 ENCOUNTER — Encounter: Payer: Self-pay | Admitting: Internal Medicine

## 2012-04-02 ENCOUNTER — Inpatient Hospital Stay (HOSPITAL_COMMUNITY): Payer: BC Managed Care – PPO

## 2012-04-02 DIAGNOSIS — J90 Pleural effusion, not elsewhere classified: Principal | ICD-10-CM

## 2012-04-02 DIAGNOSIS — R1900 Intra-abdominal and pelvic swelling, mass and lump, unspecified site: Secondary | ICD-10-CM

## 2012-04-02 DIAGNOSIS — E039 Hypothyroidism, unspecified: Secondary | ICD-10-CM

## 2012-04-02 LAB — CBC
MCH: 28.7 pg (ref 26.0–34.0)
MCHC: 32.7 g/dL (ref 30.0–36.0)
MCV: 87.9 fL (ref 78.0–100.0)
Platelets: 486 10*3/uL — ABNORMAL HIGH (ref 150–400)
RBC: 4.56 MIL/uL (ref 3.87–5.11)

## 2012-04-02 LAB — BODY FLUID CELL COUNT WITH DIFFERENTIAL: Other Cells, Fluid: 0 %

## 2012-04-02 LAB — COMPREHENSIVE METABOLIC PANEL
AST: 18 U/L (ref 0–37)
CO2: 26 mEq/L (ref 19–32)
Calcium: 9 mg/dL (ref 8.4–10.5)
Creatinine, Ser: 0.85 mg/dL (ref 0.50–1.10)
GFR calc Af Amer: 86 mL/min — ABNORMAL LOW (ref >90)
GFR calc non Af Amer: 75 mL/min — ABNORMAL LOW (ref >90)
Glucose, Bld: 103 mg/dL — ABNORMAL HIGH (ref 70–99)
Total Protein: 7.1 g/dL (ref 6.0–8.3)

## 2012-04-02 LAB — LACTATE DEHYDROGENASE, PLEURAL OR PERITONEAL FLUID

## 2012-04-02 MED ORDER — FUROSEMIDE 40 MG PO TABS
40.0000 mg | ORAL_TABLET | Freq: Every day | ORAL | Status: DC
  Administered 2012-04-03 – 2012-04-06 (×4): 40 mg via ORAL
  Filled 2012-04-02 (×5): qty 1

## 2012-04-02 NOTE — Progress Notes (Signed)
[facility] PROGRESS NOTE  Shelley Sanchez XLK:[id number] DOB: 1955-11-04 DOA: 04/01/2012 PCP: Pcp Not In System  Assessment/Plan: Active Problems:  Bilateral pleural effusion  Pelvic mass  Dyspnea  Hypothyroidism    1. Bilateral pleural effusion: Patient initially presented to [facility] in 12/[address]13, was referred to Pulmonologist for abnormal CXR with bilateral pleural effusion, and is s/p right thoracocentesis with evacuation of >1.6 L fluid on 03/11/2012. Cytology was negative at that time. She now has a recurrence, shown on CXR 03/28/12, and confirmed by Chest CT scan on 04/01/12. As effusions are quite large, repeat thoracocentesis will likely be needed. Fortunately, there were no pericardial abnormalities. CTA is negative for PE. Patient has already been seen by pulmonary team this AM, and recommendations are noted.  2. Ascties/Pelvic mass: Abdominal/Pelvic CT scan, done to evaluate progressive increase in abdominal girth and clinical ascites, revealed a large mass in the pelvis measuring approximately 12.5 x 12.7 x  1.8 cm, presumably a large ovarian malignancy. There is a large volume of ascites, which is presumably malignant. Also, there are two liver lesions in the right lobe of the liver, which are incompletely characterized on noncontrast CT examination. Diagnostic/therapeutic paracentesis was done by IR on 04/02/12, with evacuation of 1.4 L fluid.  Studies are pending. CA-125 tumor marker is 2418.2. Have requested consultation by GYN-Oncologist.  3. Dyspnea: This likely due to large bilateral pleural effusions and tense ascites, and will likely improve with thoracocentesis/paracentesis. On diuretics for now. 2D echocardiogram has been ordered to assess LV function and is pending. Dyspnea has improved after paracentesis.  4. Hypothyroidism: Continued on pre-admission thyroxine replacement therapy.    Code Status: Full Code.  Family Communication:  Disposition Plan: To be determined.     Brief narrative: 57 year old female, with know history of previous skin cancer s/p excision over a year ago, s/p previous augumentation mammoplasty, hypothyroidism diagnosed 02/2012, who started experiencing progressive shortness of breath on exertion, as well as painless abdominal swelling in October 2013. She was referred to pulmonary clinic 03/07/2012 by Dr Alwyn Ren at [facility] for evaluation of cough and abnormal CXR with bilateral effusions, underwent right thoracocentesis on 03/11/12, with evacuation of > 1.6 liter exudate/monos/neg cytology. She was seen again on 03/28/2012, by Dr Cyril Mourning, with worsening SOB and increasing abdominal girth. CXR revealed recurrent bilateral effusions. Dr Vassie Loll arranged CT chest/ abdomen-pelvis with PO contrast & inflammatory markers ANA, RA factor, with plans for close follow up. Patient had her Chest CT /Abdomional CT on 04/01/2012, and is being admitted for work up and management of abnormal findings. She denies fever or chills, has had a long-standing cough with clear phlegm, and although appetite is preserved, she has not been able to eat much because of abdominal discomfort and has lost about 5 lbs in the past 3 months.    Consultants:  Pulmonary.  Interventional radiology.  GYN-Oncology.   Procedures:  Chest CT san.  Abdominal/Pelvic CT scan.  Chest CT angiogram.  Antibiotics:  N/A.   HPI/Subjective: Feels better this AM, following abdominal paracentesis.   Objective: Vital signs in last 24 hours: Temp:  [98.4 F (36.9 C)-98.6 F (37 C)] 98.4 F (36.9 C) (01/07 0508) Pulse Rate:  [118-130] 118  (01/07 0508) Resp:  [18-26] 24  (01/07 0508) BP: (110-142)/(45-72) 117/68 mmHg (01/07 1110) SpO2:  [88 %-98 %] 90 % (01/07 1053) Weight:  [64 kg (141 lb 1.5 oz)-64.32 kg (141 lb 12.8 oz)] 64 kg (141 lb 1.5 oz) (01/07 0500) Weight change:  Last BM Date: 04/01/12  Intake/Output from previous day: 01/06 0701 - 01/07 0700 In: -  Out: 1  [Urine:1]     Physical Exam: General: Patient does not appear to be in obvious acute distress. Alert, communicative, fully oriented, talking in complete sentences, not short of breath at rest.  HEENT: No clinical pallor, no jaundice, no conjunctival injection or discharge. Hydration status is fair.  NECK: Supple, JVP not seen, no carotid bruits, no palpable lymphadenopathy, no palpable goiter.  CHEST: Clinically clear to auscultation, no wheezes, no crackles.  HEART: Sounds 1 and 2 heard, normal, regular, no murmurs.  ABDOMEN: Softly distended abdomen, post paracentesis.Non-tender. Unable to palpate organs. Bowel sounds heard.  GENITALIA: Not examined.  LOWER EXTREMITIES: No pitting edema, palpable peripheral pulses.  MUSCULOSKELETAL SYSTEM: Unremarkable.  CENTRAL NERVOUS SYSTEM: No focal neurologic deficit on gross examination.  Lab Results:  Basename 04/02/12 0525 04/01/12 1527  WBC 8.9 10.0  HGB 13.1 13.6  HCT 40.1 40.8  PLT 486* 481*    Basename 04/02/12 0525 04/01/12 1527  NA 134* 132*  K 4.0 3.9  CL 93* 94*  CO2 26 25  GLUCOSE 103* 100*  BUN 17 18  CREATININE 0.85 0.82  CALCIUM 9.0 8.9   No results found for this or any previous visit (from the past 240 hour(s)).   Studies/Results: Ct Abdomen Pelvis Wo Contrast  04/01/2012  *RADIOLOGY REPORT*  Clinical Data:  Pleural effusions.  Bloating.  Increasing shortness of breath.  Evaluate for ovarian pathology.  CT CHEST, ABDOMEN AND PELVIS WITHOUT CONTRAST  Technique:  Multidetector CT imaging of the chest, abdomen and pelvis was performed following the standard protocol without IV contrast.  Comparison:  No priors.  CT CHEST  Findings:  Mediastinum: Heart size is normal. There is no significant pericardial fluid, thickening or pericardial calcification. No pathologically enlarged mediastinal or hilar lymph nodes. Please note that accurate exclusion of hilar adenopathy is limited on noncontrast CT scans.  Esophagus is  unremarkable in appearance.  Lungs/Pleura: Large bilateral pleural effusions.  On the noncontrast images obtained, there is no definite pleural nodule or mass, however, assessment is limited without IV contrast.  These effusions are associated with extensive passive atelectasis in the dependent portions of the lungs, including near complete atelectasis of both lower lobes.  There is also some atelectasis in the right middle lobe.  Musculoskeletal: There are no aggressive appearing lytic or blastic lesions noted in the visualized portions of the skeleton. Bilateral breast implants are incidentally noted.  IMPRESSION:  1.  Large bilateral pleural effusions with extensive passive atelectasis in both lungs.  CT ABDOMEN AND PELVIS  Findings:  Abdomen/Pelvis: 3.7 x 3.1 cm low attenuation lesion in segment 7 of the liver.  Smaller subcentimeter low attenuation lesion is also noted in segment 7 of the liver (image 53 of series 2).  There is some high attenuation material layering dependently in the lumen of the gallbladder, likely represent biliary sludge.  The appearance of the pancreas, spleen, bilateral adrenal glands and bilateral kidneys is unremarkable on this noncontrast CT examination.  There is a large volume of ascites throughout the peritoneal cavity.  In the pelvis there is a complex heterogeneous appearing soft tissue mass that appears to have multiple internal cystic components and some small amounts of calcium.  This lesion is irregular in shape and therefore difficult to accurately measure, however, it measures at least 12.5 x 12.7 x 11.8 cm. Although this comes in close proximity to the patient's uterus, the  lesion is favored to be separate from the uterus, likely of ovarian origin. No pneumoperitoneum.  No pathologic distension of small bowel.  Musculoskeletal: There are no aggressive appearing lytic or blastic lesions noted in the visualized portions of the skeleton.  IMPRESSION:  1.  Large mass in the  pelvis measuring approximately 12.5 x 12.7 x 1.8 cm, presumably a large ovarian malignancy.  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 2.  There are two liver lesions in the right lobe of the liver, as above, which are incompletely characterized on this noncontrast CT examination. 3.  Probable biliary sludge layering dependently in the gallbladder.   Original Report Authenticated By: Trudie Reed, M.D.    Ct Chest Wo Contrast  04/01/2012  *RADIOLOGY REPORT*  Clinical Data:  Pleural effusions.  Bloating.  Increasing shortness of breath.  Evaluate for ovarian pathology.  CT CHEST, ABDOMEN AND PELVIS WITHOUT CONTRAST  Technique:  Multidetector CT imaging of the chest, abdomen and pelvis was performed following the standard protocol without IV contrast.  Comparison:  No priors.  CT CHEST  Findings:  Mediastinum: Heart size is normal. There is no significant pericardial fluid, thickening or pericardial calcification. No pathologically enlarged mediastinal or hilar lymph nodes. Please note that accurate exclusion of hilar adenopathy is limited on noncontrast CT scans.  Esophagus is unremarkable in appearance.  Lungs/Pleura: Large bilateral pleural effusions.  On the noncontrast images obtained, there is no definite pleural nodule or mass, however, assessment is limited without IV contrast.  These effusions are associated with extensive passive atelectasis in the dependent portions of the lungs, including near complete atelectasis of both lower lobes.  There is also some atelectasis in the right middle lobe.  Musculoskeletal: There are no aggressive appearing lytic or blastic lesions noted in the visualized portions of the skeleton. Bilateral breast implants are incidentally noted.  IMPRESSION:  1.  Large bilateral pleural effusions with extensive passive atelectasis in both lungs.  CT ABDOMEN AND PELVIS  Findings:  Abdomen/Pelvis: 3.7 x 3.1 cm low attenuation lesion in segment 7 of  the liver.  Smaller subcentimeter low attenuation lesion is also noted in segment 7 of the liver (image 53 of series 2).  There is some high attenuation material layering dependently in the lumen of the gallbladder, likely represent biliary sludge.  The appearance of the pancreas, spleen, bilateral adrenal glands and bilateral kidneys is unremarkable on this noncontrast CT examination.  There is a large volume of ascites throughout the peritoneal cavity.  In the pelvis there is a complex heterogeneous appearing soft tissue mass that appears to have multiple internal cystic components and some small amounts of calcium.  This lesion is irregular in shape and therefore difficult to accurately measure, however, it measures at least 12.5 x 12.7 x 11.8 cm. Although this comes in close proximity to the patient's uterus, the lesion is favored to be separate from the uterus, likely of ovarian origin. No pneumoperitoneum.  No pathologic distension of small bowel.  Musculoskeletal: There are no aggressive appearing lytic or blastic lesions noted in the visualized portions of the skeleton.  IMPRESSION:  1.  Large mass in the pelvis measuring approximately 12.5 x 12.7 x 1.8 cm, presumably a large ovarian malignancy.  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 2.  There are two liver lesions in the right lobe of the liver, as above, which are incompletely characterized on this noncontrast CT examination. 3.  Probable biliary  sludge layering dependently in the gallbladder.   Original Report Authenticated By: Trudie Reed, M.D.    Ct Angio Chest Pe W/cm &/or Wo Cm  04/01/2012  *RADIOLOGY REPORT*  Clinical Data: Fluid in the lungs.  Abdominal pain.  Short of breath.  Coughing.  Abdominal mass.  Pulmonary embolism.  CT ANGIOGRAPHY CHEST  Technique:  Multidetector CT imaging of the chest using the standard protocol during bolus administration of intravenous contrast. Multiplanar reconstructed  images including MIPs were obtained and reviewed to evaluate the vascular anatomy.  Contrast: OMNIPAQUE IOHEXOL 350 MG/ML SOLN  Comparison: CT abdomen 04/01/2012.  Findings: Large bilateral pleural effusions are present.  Negative for pulmonary embolus.  Technically adequate study.  Aorta and branch vessels appear within normal limits.  Relaxation atelectasis affecting all lobes, most prominently involving the lower lobes bilaterally.  No definite pericardial effusion.  No axillary adenopathy.  Trachea and mainstem bronchi appear patent. Calcification is present and atelectatic left upper lobe adjacent to the fissure, probably old granulomatous calcification.  No aggressive osseous lesions are present.  Incidental imaging of the upper abdomen demonstrates a cystic lesion in the right hepatic lobe previously described on CT and probably representing a cyst. Bilateral breast implants are noted.  IMPRESSION: 1.  No pulmonary embolus.  No acute vascular abnormality. 2.  Large left greater than right bilateral pleural effusions with Relaxation/compressive atelectasis.   Original Report Authenticated By: Andreas Newport, M.D.    US Paracentesis  04/02/2012  *RADIOLOGY REPORT*  Clinical Data: Abdominal distension, shortness of breath, ascites noted on recent CT  ULTRASOUND GUIDED PARACENTESIS  Comparison:  None  An ultrasound guided paracentesis was thoroughly discussed with the patient and questions answered.  The benefits, risks, alternatives and complications were also discussed.  The patient understands and wishes to proceed with the procedure.  Written consent was obtained.  Ultrasound was performed to localize and mark an adequate pocket of fluid in the left lower quadrant of the abdomen.  The area was then prepped and draped in the normal sterile fashion.  1% Lidocaine was used for local anesthesia.  Under ultrasound guidance a 19 gauge Yueh catheter was introduced.  Paracentesis was performed.  The catheter  was removed and a dressing applied.  Complications:  None  Findings:  A total of approximately 1.4 liters of amber colored fluid was removed.  A fluid sample was sent for laboratory analysis.  IMPRESSION: Successful ultrasound guided paracentesis yielding 1.4 liters of ascites.  Read by Brayton El PA-C   Original Report Authenticated By: Tacey Ruiz, MD     Medications: Scheduled Meds:   . furosemide  40 mg Intravenous Daily  . heparin  5,000 Units Subcutaneous Q8H  . levothyroxine  50 mcg Oral QAC breakfast  . sodium chloride  3 mL Intravenous Q12H  . sodium chloride  3 mL Intravenous Q12H   Continuous Infusions:  PRN Meds:.sodium chloride, acetaminophen, acetaminophen, morphine injection, ondansetron (ZOFRAN) IV, ondansetron, oxyCODONE, sodium chloride    LOS: 1 day   Shelley Sanchez,CHRISTOPHER  [facility] Pager [phone]. If 8PM-8AM, please contact night-coverage at [email or web address], password [facility] 04/02/2012, 1:03 PM  LOS: 1 day

## 2012-04-02 NOTE — Care Management Note (Signed)
    Page 1 of 1   04/06/2012     5:46:01 PM   CARE MANAGEMENT NOTE 04/06/2012  Patient:  Shelley Sanchez, Shelley Sanchez   Account Number:  [id number]  Date Initiated:  04/02/2012  Documentation initiated by:  Lanier Clam  Subjective/Objective Assessment:   ADMITTED W/SOB.PLEURAL EFFUSION.     Action/Plan:   FROM HOME W/SPOUSE.   Anticipated DC Date:  04/06/2012   Anticipated DC Plan:  HOME/SELF CARE      DC Planning Services  CM consult      Choice offered to / List presented to:             Status of service:  Completed, signed off Medicare Important Message given?   (If response is "NO", the following Medicare IM given date fields will be blank) Date Medicare IM given:   Date Additional Medicare IM given:    Discharge Disposition:  HOME/SELF CARE  Per UR Regulation:  Reviewed for med. necessity/level of care/duration of stay  If discussed at Long Length of Stay Meetings, dates discussed:    Comments:  04/05/12 Rayshell Goecke RN,BSN NCM [phone] S/P 2ND THORACENTESIS.  04/03/12 Judianne Seiple RN,BSN NCM [phone] SPOKE TO PATIENT ABOUT PCP LISTING AS RESOURCE,.INFORMED PATIENT THAT IF NEEDING A NOTE FOR HER EMPLOYER FOR DISABILITY SHE MAY ASK HER ONCOLOGIST SINCE THEY WOULD HAVE A BETTER IDEA OF ONGOING TREATMENT,& TIME NEEDED TO RECOVER. 04/02/12 Rashada Klontz RN,BSN NCM [phone] FOR THORACENTESIS.

## 2012-04-02 NOTE — Progress Notes (Signed)
INITIAL NUTRITION ASSESSMENT  DOCUMENTATION CODES Per approved criteria  -Not Applicable   INTERVENTION: 1.  General healthful diet; encourage intake as able.  Supplements to be considered as adequacy is assessed.  NUTRITION DIAGNOSIS: Unintended wt loss related to shortness of breath as evidenced by 3 lbs wt change.   Monitor:  1.  Food/Beverage; pt consuming >50% of meals to meet >/=90% of estimated needs.  Reason for Assessment: MST  57 y.o. female  Admitting Dx: shortness of breath  ASSESSMENT: Pt admitted with bilateral pleural effusions.  Also with pelvic mass with ascites.  Pt is s/p thoracentesis.  Pt reported good appetite with decreased intake r/t abdominal pain with ~5 lbs wt loss in 3 months. Currently on Regular diet.  Pt states it takes her a great deal of time to complete a meal, but that this is improving as respiratory status improves. She reports she was eating poor at home and is 'trying to make up for lost time.'  Pt reports improvement in intake.  RD offered supplements which pt declines.  Discussed nutrition and wt status.  Stressed the importance of nutrition for healing.  Pt states agreement.    Height: Ht Readings from Last 1 Encounters:  04/01/12 5\' 7"  (1.702 m)    Weight: Wt Readings from Last 1 Encounters:  04/02/12 141 lb 1.5 oz (64 kg)    Ideal Body Weight: 135 lbs  % Ideal Body Weight: 104%  Wt Readings from Last 10 Encounters:  04/02/12 141 lb 1.5 oz (64 kg)  03/28/12 146 lb 6.4 oz (66.407 kg)  03/18/12 145 lb (65.772 kg)  03/07/12 148 lb (67.132 kg)  03/06/12 143 lb 9.6 oz (65.137 kg)  03/04/12 144 lb (65.318 kg)    Usual Body Weight: 145 lbs  % Usual Body Weight: 97%  BMI:  Body mass index is 22.10 kg/(m^2).  Estimated Nutritional Needs: Kcal: 1800-2050 Protein: 62-75g Fluid: ~2.0 L/day  Skin: wnl  Diet Order: General  EDUCATION NEEDS: -Education not appropriate at this time   Intake/Output Summary (Last 24 hours)  at 04/02/12 1614 Last data filed at 04/01/12 1647  Gross per 24 hour  Intake      0 ml  Output      1 ml  Net     -1 ml    Last BM: 1/6  Labs:   Lab 04/02/12 0525 04/01/12 1527 03/28/12 1309  NA 134* 132* 136  K 4.0 3.9 4.6  CL 93* 94* 99  CO2 26 25 29   BUN 17 18 12   CREATININE 0.85 0.82 0.8  CALCIUM 9.0 8.9 9.1  MG -- -- --  PHOS -- -- --  GLUCOSE 103* 100* 94    CBG (last 3)  No results found for this basename: GLUCAP:3 in the last 72 hours  Scheduled Meds:   . furosemide  40 mg Oral Daily  . heparin  5,000 Units Subcutaneous Q8H  . levothyroxine  50 mcg Oral QAC breakfast  . sodium chloride  3 mL Intravenous Q12H  . sodium chloride  3 mL Intravenous Q12H    Continuous Infusions:   Past Medical History  Diagnosis Date  . Skin cancer     Past Surgical History  Procedure Date  . Breast enhancement surgery 2000    Loyce Dys, MS RD LDN Clinical Inpatient Dietitian Pager: [phone] Weekend/After hours pager: [phone]

## 2012-04-02 NOTE — Progress Notes (Signed)
  Echocardiogram 2D Echocardiogram has been performed.  Cathie Beams 04/02/2012, 3:33 PM

## 2012-04-02 NOTE — Telephone Encounter (Signed)
Pt aware of referral. Will forward to PCC's to ensure they receive this. Nothing further was needed

## 2012-04-02 NOTE — Consult Note (Signed)
Consult Note: Gyn-Onc  Consult was requested by Dr. Brien Few for the evaluation of Shelley Sanchez 57 y.o. female  CC:  Chief Complaint  Patient presents with  . Shortness of Breath  . Abdominal Pain  Pelvic mass.  HPI: 57 y.o. year old G3 P3 LNMP in early 37's.  In October 2013 Shelley Sanchez  noted fatigue, and fecal urge incontinence. Also noted increasing abdominal girth.  No change in appetite, no nausea or vomiting.  Reports  SOB onset in November 2013.    Denies uterine bleeding, no abdominal pain just discomfort. Was evaluated in urgent care care in December 2013 for malaise and referred to Dr. Sherene Sires (Pulmonology)  Thoracocentesis performed prior to Christmas and the cytology was negative for malignancy.  Reported labored breathing 03/26/2012 and return of pleural effusions was noted.  CT Chest/Abd/Pelvis 04/01/2012 notable for ascites so patient was admitted for evaluation.  Paracentesis performed 04/01/2012.  Results pending.  Patient reports discomfort chest heaviness  And cough when lying flat.  Reports 20 pound weight loss in the past year.  Was able to ride 50 miles, and swam regularly until September 2013.   CT Chest Abd/Pelvis: 04/01/2012 IMPRESSION:  Large bilateral pleural effusions with extensive passive atelectasis in both lungs.  Abdomen/Pelvis: 3.7 x 3.1 cm low attenuation lesion in segment 7 of the liver. Smaller subcentimeter low attenuation lesion is also noted in segment 7 of the liver (image 53 of series 2)  There is a large volume of ascites throughout the peritoneal cavity. In the pelvis there is a complex heterogeneous appearing soft tissue mass that appears to have multiple internal cystic components and some small amounts of calcium that measures at least 12.5 x 12.7 x 11.8 cm.   Allergy: No Known Allergies  Social Hx:   History   Social History  . Marital Status: Widowed    Spouse Name: N/A    Number of Children: 3  . Years of Education: N/A   Occupational History  .  Not on file.   Social History Main Topics  . Smoking status: Never Smoker   . Smokeless tobacco: Never Used  . Alcohol Use: No  . Drug Use: No  . Sexually Active: Yes   Other Topics Concern  . Not on file   Social History Narrative  . No narrative on file    Past Surgical Hx:  Past Surgical History  Procedure Date  . Breast enhancement surgery 2000  Mammogram 5 years ago No h/o colonoscopy Last Pap with dysplasia for the past few years. No f/u for colposcopy  Past Medical Hx:  Past Medical History  Diagnosis Date  . Skin cancer     Past Gynecological History: G3P3 Menarche 14 OCP x20 years   BTL 24 LMP early 72's  Family Hx:  Family History  Problem Relation Age of Onset  . Melanoma Mother   . Skin cancer Brother   Mat aunt unknown gyn cancer  Review of Systems:  Constitutional  Feels well, fatigued/exhausted.  Minimal exertion causes fatigue Cardiovascular  No chest pain,  Pulmonary  Chest discomfort, unable to lie flat, cough with lying.  Reports clear phlehmn Gastro Intestinal  No nausea, vomitting, or diarrhoea. No bright red blood per rectum, no abdominal pain, reports mucous from the rectum. Genito Urinary  No frequency, urgency, dysuria, no vaginal bleeding or discharge Musculo Skeletal  No myalgia, arthralgia, joint swelling or pain  Neurologic  No weakness, numbness, change in gait,  Psychology  No depression, anxiety,  insomnia.   Vitals:  Blood pressure 109/61, pulse 108, temperature 98.4 F (36.9 C), temperature source Oral, resp. rate 18, height 5\' 7"  (1.702 m), weight 141 lb 1.5 oz (64 kg), SpO2 91.00%.  Physical Exam: WD in NAD, significant coughing with attempt to lie flat Neck  Supple NROM, without any enlargements.  Lymph Node Survey No cervical supraclavicular or inguinal adenopathy Cardiovascular  Pulse normal regularity and rhythm. S1 and S2 normal.  Lungs  Breath sounds absent 2/3 of left lung and 1/2 of right lung. Skin    No rash/lesions/breakdown  Psychiatry  Alert and oriented to person, place, and time  Abdomen  Normoactive bowel sounds, abdomen soft, non-tender No palpable omental cake.  Pelvic mass is not palpable. Back No CVA tenderness Genito Urinary  Vulva/vagina: Normal external female genitalia.  No lesions. No discharge or bleeding.  Bladder/urethra:  No lesions or masses  Vagina:no palpable masses  Cervix: Small smooth to palpation.   Uterus: Small, mobile, no parametrial involvement or nodularity.  Adnexa: Smooth 12 cm pelvic mass, no nodularity Rectal  Good tone, no masses no cul de sac nodularity. Smooth pelvic mass palpable. Extremities  No bilateral cyanosis, clubbing or edema.   Assessment/Plan:  Shelley Sanchez  is a 57 y.o.  year old with bilateral large pulmonary effusions large volume ascites and pelvic mass and a CA 125 2418.2.  There is a 3.7 cm lesion within the liver that may represent metastatic disease.  Patient with long standing history of cervical dysplasia without colposcopy.  Overall presentation concerning for a metastatic gyn malignancy.  Rec biopsy of the liver and CEA.  If liver biopsy or paracentesis c/w gyn primary would proceed with dose dense taxol/carboplatin for three cycles followed by interval debulking.  Patient would be well served with immediate chemotherapy given the recurrent pulmonary effusions.    If liver bx and paracentesis are negative [name]  will need immediate cervical colposcopy and exp lap with BSO and other indicated procedures.   Gyn Onc service will follow.  Plan d/w [name].   All of her questions were answered to her satisfaction.      Laurette Schimke, MD, PhD 04/02/2012, 2:33 PM

## 2012-04-02 NOTE — Procedures (Signed)
Successful US guided paracentesis from LLQ.  Yielded 1.4L of amber colored fluid.  No immediate complications.  Pt tolerated well.   Specimen was sent for labs.  Brayton El PA-C 04/02/2012 12:13 PM

## 2012-04-02 NOTE — Consult Note (Addendum)
PULMONARY  / CRITICAL CARE MEDICINE  Name: Shelley Sanchez MRN: [id number] DOB: 09/02/1955    LOS: 1  REFERRING PROVIDER:  [facility]  CHIEF COMPLAINT:  SOB HISTORY OF PRESENT ILLNESS:   9 yowf never smoker with excellent baseline activity tolerance able to cross biking in Sept of 2013 referred to pulmonary clinic 03/07/2012 by Dr Alwyn Ren at [facility] for eval of cough and abn cxr with bilateral effusions  12/16 R thoracentesis > 1.6 liter exudate/ monos/ neg cyt/ tsh slt elevated > rx synthoid 50 mcg per day  Admitted to [facility] 1/6 with increasing SOB, bilat pleural effusion and abd ct ? Ovarian mass. 1-7 scheduled for paracentesis per IR. PCCM asked to evaluate   Sleeping ok without nocturnal  or early am exacerbation  of respiratory  c/o's or need for noct saba. Also denies any obvious fluctuation of symptoms with weather or environmental changes or other aggravating or alleviating factors except as outlined above   ROS  The following are not active complaints unless bolded sore throat, dysphagia, dental problems, itching, sneezing,  nasal congestion or excess/ purulent secretions, ear ache,   fever, chills, sweats, unintended wt loss, pleuritic or exertional cp, hemoptysis,  orthopnea pnd or leg swelling, presyncope, palpitations, heartburn, abdominal pain assoc with  progressive distention x sev weeks, anorexia, nausea, vomiting, diarrhea  or change in bowel or urinary habits, change in stools or urine, dysuria,hematuria,  rash, arthralgias, visual complaints, headache, numbness weakness or ataxia or problems with walking or coordination,  change in mood/affect or memory.      PAST MEDICAL HISTORY :  Past Medical History  Diagnosis Date  . Skin cancer    Past Surgical History  Procedure Date  . Breast enhancement surgery 2000   Prior to Admission medications   Medication Sig Start Date End Date Taking? Authorizing Provider  ibuprofen (ADVIL,MOTRIN) 200 MG tablet Take 400 mg by mouth every 6 (six)  hours as needed. For pain   Yes Historical Provider, MD  levothyroxine (SYNTHROID) 50 MCG tablet Take 1 tablet (50 mcg total) by mouth daily. 03/08/12  Yes Nyoka Cowden, MD   No Known Allergies  FAMILY HISTORY:  Family History  Problem Relation Age of Onset  . Melanoma Mother   . Skin cancer Brother    SOCIAL HISTORY:  reports that she has never smoked. She has never used smokeless tobacco. She reports that she does not drink alcohol or use illicit drugs.  REVIEW OF SYSTEMS:   See HPI    VITAL SIGNS: Temp:  [98.4 F (36.9 C)-98.6 F (37 C)] 98.4 F (36.9 C) (01/07 0508) Pulse Rate:  [118-130] 118  (01/07 0508) Resp:  [18-26] 24  (01/07 0508) BP: (110-142)/(45-72) 117/68 mmHg (01/07 1110) SpO2:  [88 %-98 %] 90 % (01/07 1053) Weight:  [64 kg (141 lb 1.5 oz)-64.32 kg (141 lb 12.8 oz)] 64 kg (141 lb 1.5 oz) (01/07 0500)  PHYSICAL EXAMINATION: General: WNWDWF sob with any movement Neuro:  Intact HEENT:  No LAN Neck:  No JVD Cardiovascular:  HSR RRR Lungs:  decreased bs 1/2 down bilat Abdomen:  Distended + fuid wave Musculoskeletal:  intact Skin:  warm   Lab 04/02/12 0525 04/01/12 1527 03/28/12 1309  NA 134* 132* 136  K 4.0 3.9 4.6  CL 93* 94* 99  CO2 26 25 29   BUN 17 18 12   CREATININE 0.85 0.82 0.8  GLUCOSE 103* 100* 94    Lab 04/02/12 0525 04/01/12 1527  HGB 13.1 13.6  HCT  40.1 40.8  WBC 8.9 10.0  PLT 486* 481*   Ct Abdomen Pelvis Wo Contrast  04/01/2012  *RADIOLOGY REPORT*  Clinical Data:  Pleural effusions.  Bloating.  Increasing shortness of breath.  Evaluate for ovarian pathology.  CT CHEST, ABDOMEN AND PELVIS WITHOUT CONTRAST  Technique:  Multidetector CT imaging of the chest, abdomen and pelvis was performed following the standard protocol without IV contrast.  Comparison:  No priors.  CT CHEST  Findings:  Mediastinum: Heart size is normal. There is no significant pericardial fluid, thickening or pericardial calcification. No pathologically enlarged  mediastinal or hilar lymph nodes. Please note that accurate exclusion of hilar adenopathy is limited on noncontrast CT scans.  Esophagus is unremarkable in appearance.  Lungs/Pleura: Large bilateral pleural effusions.  On the noncontrast images obtained, there is no definite pleural nodule or mass, however, assessment is limited without IV contrast.  These effusions are associated with extensive passive atelectasis in the dependent portions of the lungs, including near complete atelectasis of both lower lobes.  There is also some atelectasis in the right middle lobe.  Musculoskeletal: There are no aggressive appearing lytic or blastic lesions noted in the visualized portions of the skeleton. Bilateral breast implants are incidentally noted.  IMPRESSION:  1.  Large bilateral pleural effusions with extensive passive atelectasis in both lungs.  CT ABDOMEN AND PELVIS  Findings:  Abdomen/Pelvis: 3.7 x 3.1 cm low attenuation lesion in segment 7 of the liver.  Smaller subcentimeter low attenuation lesion is also noted in segment 7 of the liver (image 53 of series 2).  There is some high attenuation material layering dependently in the lumen of the gallbladder, likely represent biliary sludge.  The appearance of the pancreas, spleen, bilateral adrenal glands and bilateral kidneys is unremarkable on this noncontrast CT examination.  There is a large volume of ascites throughout the peritoneal cavity.  In the pelvis there is a complex heterogeneous appearing soft tissue mass that appears to have multiple internal cystic components and some small amounts of calcium.  This lesion is irregular in shape and therefore difficult to accurately measure, however, it measures at least 12.5 x 12.7 x 11.8 cm. Although this comes in close proximity to the patient's uterus, the lesion is favored to be separate from the uterus, likely of ovarian origin. No pneumoperitoneum.  No pathologic distension of small bowel.  Musculoskeletal: There  are no aggressive appearing lytic or blastic lesions noted in the visualized portions of the skeleton.  IMPRESSION:  1.  Large mass in the pelvis measuring approximately 12.5 x 12.7 x 1.8 cm, presumably a large ovarian malignancy.  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 2.  There are two liver lesions in the right lobe of the liver, as above, which are incompletely characterized on this noncontrast CT examination. 3.  Probable biliary sludge layering dependently in the gallbladder.   Original Report Authenticated By: Trudie Reed, M.D.    Ct Chest Wo Contrast  04/01/2012  *RADIOLOGY REPORT*  Clinical Data:  Pleural effusions.  Bloating.  Increasing shortness of breath.  Evaluate for ovarian pathology.  CT CHEST, ABDOMEN AND PELVIS WITHOUT CONTRAST  Technique:  Multidetector CT imaging of the chest, abdomen and pelvis was performed following the standard protocol without IV contrast.  Comparison:  No priors.  CT CHEST  Findings:  Mediastinum: Heart size is normal. There is no significant pericardial fluid, thickening or pericardial calcification. No pathologically enlarged mediastinal or hilar lymph nodes. Please note that  accurate exclusion of hilar adenopathy is limited on noncontrast CT scans.  Esophagus is unremarkable in appearance.  Lungs/Pleura: Large bilateral pleural effusions.  On the noncontrast images obtained, there is no definite pleural nodule or mass, however, assessment is limited without IV contrast.  These effusions are associated with extensive passive atelectasis in the dependent portions of the lungs, including near complete atelectasis of both lower lobes.  There is also some atelectasis in the right middle lobe.  Musculoskeletal: There are no aggressive appearing lytic or blastic lesions noted in the visualized portions of the skeleton. Bilateral breast implants are incidentally noted.  IMPRESSION:  1.  Large bilateral pleural effusions with extensive  passive atelectasis in both lungs.  CT ABDOMEN AND PELVIS  Findings:  Abdomen/Pelvis: 3.7 x 3.1 cm low attenuation lesion in segment 7 of the liver.  Smaller subcentimeter low attenuation lesion is also noted in segment 7 of the liver (image 53 of series 2).  There is some high attenuation material layering dependently in the lumen of the gallbladder, likely represent biliary sludge.  The appearance of the pancreas, spleen, bilateral adrenal glands and bilateral kidneys is unremarkable on this noncontrast CT examination.  There is a large volume of ascites throughout the peritoneal cavity.  In the pelvis there is a complex heterogeneous appearing soft tissue mass that appears to have multiple internal cystic components and some small amounts of calcium.  This lesion is irregular in shape and therefore difficult to accurately measure, however, it measures at least 12.5 x 12.7 x 11.8 cm. Although this comes in close proximity to the patient's uterus, the lesion is favored to be separate from the uterus, likely of ovarian origin. No pneumoperitoneum.  No pathologic distension of small bowel.  Musculoskeletal: There are no aggressive appearing lytic or blastic lesions noted in the visualized portions of the skeleton.  IMPRESSION:  1.  Large mass in the pelvis measuring approximately 12.5 x 12.7 x 1.8 cm, presumably a large ovarian malignancy.  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 2.  There are two liver lesions in the right lobe of the liver, as above, which are incompletely characterized on this noncontrast CT examination. 3.  Probable biliary sludge layering dependently in the gallbladder.   Original Report Authenticated By: Trudie Reed, M.D.    Ct Angio Chest Pe W/cm &/or Wo Cm  04/01/2012  *RADIOLOGY REPORT*  Clinical Data: Fluid in the lungs.  Abdominal pain.  Short of breath.  Coughing.  Abdominal mass.  Pulmonary embolism.  CT ANGIOGRAPHY CHEST  Technique:   Multidetector CT imaging of the chest using the standard protocol during bolus administration of intravenous contrast. Multiplanar reconstructed images including MIPs were obtained and reviewed to evaluate the vascular anatomy.  Contrast: OMNIPAQUE IOHEXOL 350 MG/ML SOLN  Comparison: CT abdomen 04/01/2012.  Findings: Large bilateral pleural effusions are present.  Negative for pulmonary embolus.  Technically adequate study.  Aorta and branch vessels appear within normal limits.  Relaxation atelectasis affecting all lobes, most prominently involving the lower lobes bilaterally.  No definite pericardial effusion.  No axillary adenopathy.  Trachea and mainstem bronchi appear patent. Calcification is present and atelectatic left upper lobe adjacent to the fissure, probably old granulomatous calcification.  No aggressive osseous lesions are present.  Incidental imaging of the upper abdomen demonstrates a cystic lesion in the right hepatic lobe previously described on CT and probably representing a cyst. Bilateral breast implants are noted.  IMPRESSION: 1.  No pulmonary  embolus.  No acute vascular abnormality. 2.  Large left greater than right bilateral pleural effusions with Relaxation/compressive atelectasis.   Original Report Authenticated By: Andreas Newport, M.D.     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   Brett Canales Minor ACNP [facility] PCCM Pager [phone] till 3 pm If no answer page [phone] 04/02/2012, 11:15 AM  Pt seen and examined and chart/ xrays reviewed - for now does not need additional thoracentesis.  Sandrea Hughs, MD Pulmonary and Critical Care Medicine [facility] Cell [phone] After 5:30 PM or weekends, call [phone]

## 2012-04-03 ENCOUNTER — Encounter: Payer: Self-pay | Admitting: *Deleted

## 2012-04-03 DIAGNOSIS — R0609 Other forms of dyspnea: Secondary | ICD-10-CM

## 2012-04-03 DIAGNOSIS — R188 Other ascites: Secondary | ICD-10-CM

## 2012-04-03 LAB — COMPREHENSIVE METABOLIC PANEL
ALT: 7 U/L (ref 0–35)
Alkaline Phosphatase: 63 U/L (ref 39–117)
BUN: 18 mg/dL (ref 6–23)
CO2: 27 mEq/L (ref 19–32)
Chloride: 98 mEq/L (ref 96–112)
GFR calc Af Amer: >90 mL/min (ref >90)
GFR calc non Af Amer: 79 mL/min — ABNORMAL LOW (ref >90)
Glucose, Bld: 128 mg/dL — ABNORMAL HIGH (ref 70–99)
Potassium: 3.5 mEq/L (ref 3.5–5.1)
Total Bilirubin: 0.3 mg/dL (ref 0.3–1.2)
Total Protein: 6.4 g/dL (ref 6.0–8.3)

## 2012-04-03 LAB — CBC
HCT: 37.7 % (ref 36.0–46.0)
Hemoglobin: 12.6 g/dL (ref 12.0–15.0)
MCHC: 33.4 g/dL (ref 30.0–36.0)
RBC: 4.35 MIL/uL (ref 3.87–5.11)

## 2012-04-03 LAB — CEA: CEA: <0.5 ng/mL (ref 0.0–5.0)

## 2012-04-03 NOTE — Progress Notes (Signed)
[facility] PROGRESS NOTE  Shelley Sanchez WUJ:[id number] DOB: 27-Dec-1955 DOA: 04/01/2012 PCP: Pcp Not In System  Assessment/Plan: Active Problems:  Bilateral pleural effusion  Pelvic mass  Dyspnea  Hypothyroidism    1. Bilateral pleural effusion: Patient initially presented to [facility] in 12/20013, was referred to Pulmonologist for abnormal CXR with bilateral pleural effusion, and is s/p right thoracocentesis with evacuation of >1.6 L fluid on 03/11/2012. Cytology was negative at that time. She now has a recurrence, shown on CXR 03/28/12, and confirmed by Chest CT scan on 04/01/12. As effusions are quite large. Fortunately, there were no pericardial abnormalities. CTA is negative for PE.  - pleural fluid cytology neg, per ccm repeat thoracocentesis not Needed today -appreciate CCM in put, recommending prn thoracentesis on outpt follow up  2. Asctes/Pelvic mass: Abdominal/Pelvic CT scan, done to evaluate progressive increase in abdominal girth and clinical ascites, revealed a large mass in the pelvis measuring approximately 12.5 x 12.7 x  1.8 cm, presumably a large ovarian malignancy. There is a large volume of ascites, which is presumably malignant. Also, there are two liver lesions in the right lobe of the liver, which are incompletely characterized on noncontrast CT examination. Diagnostic/therapeutic paracentesis was done by IR on 04/02/12, with evacuation of 1.4 L fluid.  Studies are pending. CA-125 tumor marker is 2418.2.  -appreciate GYN-Oncology assisstance.  -I have consulted medical oncology/Dr Mohamed, await eval -per radiologist the liver lesion that was targeted for bx is more likely a fluid density and will be low yield if bx, so he recommends waiting on the cytology on peritoneal fluid. -notified surgical onc NP of above, I also called Dr Curly Shores but no response 3. Dyspnea: This likely due to large bilateral pleural effusions and tense ascites, much mprove with  thoracocentesis/paracentesis. On diuretics for now.  -echo with EF 60-65%, trivial pericardial effusion with no tamponade physiology 4. Hypothyroidism: Continued on pre-admission thyroxine replacement therapy.    Code Status: Full Code.  Family Communication: directly with pt at bedside Disposition Plan: To be determined.    Brief narrative: 57 year old female, with know history of previous skin cancer s/p excision over a year ago, s/p previous augumentation mammoplasty, hypothyroidism diagnosed 02/2012, who started experiencing progressive shortness of breath on exertion, as well as painless abdominal swelling in October 2013. She was referred to pulmonary clinic 03/07/2012 by Dr Alwyn Ren at [facility] for evaluation of cough and abnormal CXR with bilateral effusions, underwent right thoracocentesis on 03/11/12, with evacuation of > 1.6 liter exudate/monos/neg cytology. She was seen again on 03/28/2012, by Dr Cyril Mourning, with worsening SOB and increasing abdominal girth. CXR revealed recurrent bilateral effusions. Dr Vassie Loll arranged CT chest/ abdomen-pelvis with PO contrast & inflammatory markers ANA, RA factor, with plans for close follow up. Patient had her Chest CT /Abdomional CT on 04/01/2012, and is being admitted for work up and management of abnormal findings. She denies fever or chills, has had a long-standing cough with clear phlegm, and although appetite is preserved, she has not been able to eat much because of abdominal discomfort and has lost about 5 lbs in the past 3 months.    Consultants:  Pulmonary.  Interventional radiology.  GYN-Oncology.   Procedures:  Chest CT san.  Abdominal/Pelvic CT scan.  Chest CT angiogram.  Antibiotics:  N/A.   HPI/Subjective: States she slept much better last pm, and feels much better overall  Objective: Vital signs in last 24 hours: Temp:  [98.2 F (36.8 C)-98.4 F (36.9 C)] 98.4 F (  36.9 C) (01/08 0455) Pulse Rate:  [94-102] 102  (01/08  0455) Resp:  [20] 20  (01/08 0455) BP: (96-114)/(61-62) 96/61 mmHg (01/08 0455) SpO2:  [91 %-96 %] 91 % (01/08 0455) Weight:  [61.1 kg (134 lb 11.2 oz)] 61.1 kg (134 lb 11.2 oz) (01/08 0500) Weight change: -3.221 kg (-7 lb 1.6 oz) Last BM Date: 04/01/12  Intake/Output from previous day: 01/07 0701 - 01/08 0700 In: 360 [P.O.:360] Out: -      Physical Exam: General: Patient does not appear to be in obvious acute distress. Alert, communicative, fully oriented, talking in complete sentences, not short of breath at rest.  HEENT: No clinical pallor, no jaundice, no conjunctival injection or discharge.   NECK: Supple, JVP not seen, no carotid bruits, no palpable lymphadenopathy, no palpable goiter.  CHEST: clear to auscultation, no wheezes, no crackles.  HEART: Sounds 1 and 2 heard, normal, regular, no murmurs.  ABDOMEN: Soft, mildly distended abdomen.Non-tender. No masses palpable. Bowel sounds heard.  GENITALIA: Not examined.  LOWER EXTREMITIES: No edema, palpable peripheral pulses.  MUSCULOSKELETAL SYSTEM: Unremarkable.  CENTRAL NERVOUS SYSTEM: No focal neurologic deficit on gross examination.  Lab Results:  Basename 04/03/12 0513 04/02/12 0525  WBC 7.4 8.9  HGB 12.6 13.1  HCT 37.7 40.1  PLT 403* 486*    Basename 04/03/12 0513 04/02/12 0525  NA 134* 134*  K 3.5 4.0  CL 98 93*  CO2 27 26  GLUCOSE 128* 103*  BUN 18 17  CREATININE 0.81 0.85  CALCIUM 8.4 9.0   Recent Results (from the past 240 hour(s))  BODY FLUID CULTURE     Status: Normal (Preliminary result)   Collection Time   04/02/12 11:31 AM      Component Value Range Status Comment   Specimen Description PLEURAL   Final    Special Requests NONE   Final    Gram Stain     Final    Value: FEW WBC PRESENT,BOTH PMN AND MONONUCLEAR     NO ORGANISMS SEEN   Culture NO GROWTH 1 DAY   Final    Report Status PENDING   Incomplete      Studies/Results: Ct Angio Chest Pe W/cm &/or Wo Cm  04/01/2012  *RADIOLOGY REPORT*   Clinical Data: Fluid in the lungs.  Abdominal pain.  Short of breath.  Coughing.  Abdominal mass.  Pulmonary embolism.  CT ANGIOGRAPHY CHEST  Technique:  Multidetector CT imaging of the chest using the standard protocol during bolus administration of intravenous contrast. Multiplanar reconstructed images including MIPs were obtained and reviewed to evaluate the vascular anatomy.  Contrast: OMNIPAQUE IOHEXOL 350 MG/ML SOLN  Comparison: CT abdomen 04/01/2012.  Findings: Large bilateral pleural effusions are present.  Negative for pulmonary embolus.  Technically adequate study.  Aorta and branch vessels appear within normal limits.  Relaxation atelectasis affecting all lobes, most prominently involving the lower lobes bilaterally.  No definite pericardial effusion.  No axillary adenopathy.  Trachea and mainstem bronchi appear patent. Calcification is present and atelectatic left upper lobe adjacent to the fissure, probably old granulomatous calcification.  No aggressive osseous lesions are present.  Incidental imaging of the upper abdomen demonstrates a cystic lesion in the right hepatic lobe previously described on CT and probably representing a cyst. Bilateral breast implants are noted.  IMPRESSION: 1.  No pulmonary embolus.  No acute vascular abnormality. 2.  Large left greater than right bilateral pleural effusions with Relaxation/compressive atelectasis.   Original Report Authenticated By: Andreas Newport, M.D.  US Paracentesis  04/02/2012  *RADIOLOGY REPORT*  Clinical Data: Abdominal distension, shortness of breath, ascites noted on recent CT  ULTRASOUND GUIDED PARACENTESIS  Comparison:  None  An ultrasound guided paracentesis was thoroughly discussed with the patient and questions answered.  The benefits, risks, alternatives and complications were also discussed.  The patient understands and wishes to proceed with the procedure.  Written consent was obtained.  Ultrasound was performed to localize and  mark an adequate pocket of fluid in the left lower quadrant of the abdomen.  The area was then prepped and draped in the normal sterile fashion.  1% Lidocaine was used for local anesthesia.  Under ultrasound guidance a 19 gauge Yueh catheter was introduced.  Paracentesis was performed.  The catheter was removed and a dressing applied.  Complications:  None  Findings:  A total of approximately 1.4 liters of amber colored fluid was removed.  A fluid sample was sent for laboratory analysis.  IMPRESSION: Successful ultrasound guided paracentesis yielding 1.4 liters of ascites.  Read by Brayton El PA-C   Original Report Authenticated By: Tacey Ruiz, MD     Medications: Scheduled Meds:    . furosemide  40 mg Oral Daily  . levothyroxine  50 mcg Oral QAC breakfast  . sodium chloride  3 mL Intravenous Q12H  . sodium chloride  3 mL Intravenous Q12H   Continuous Infusions:  PRN Meds:.sodium chloride, acetaminophen, acetaminophen, morphine injection, ondansetron (ZOFRAN) IV, ondansetron, oxyCODONE, sodium chloride    LOS: 2 days   [facility]  [facility] Pager [phone]. If 8PM-8AM, please contact night-coverage at [email or web address], password [facility] 04/03/2012, 1:56 PM  LOS: 2 days

## 2012-04-03 NOTE — Progress Notes (Addendum)
PULMONARY  / CRITICAL CARE MEDICINE  Name: Shelley Sanchez MRN: [id number] DOB: Oct 20, 1955    LOS: 2  REFERRING PROVIDER:  [facility]  CHIEF COMPLAINT:  SOB HISTORY OF PRESENT ILLNESS:   59 yowf never smoker with excellent baseline activity tolerance able to cross biking in Sept of 2013 referred to pulmonary clinic 03/07/2012 by Dr Alwyn Ren at [facility] for eval of cough and abn cxr with bilateral effusions  12/16 R thoracentesis > 1.6 liter exudate/ monos/ neg cyt/ tsh slt elevated > rx synthoid 50 mcg per day  Admitted to [facility] 1/6 with increasing SOB, bilat pleural effusion and abd ct ? Ovarian mass. 1-7  paracentesis per IR. PCCM asked to evaluate      VITAL SIGNS: Temp:  [98.2 F (36.8 C)-98.4 F (36.9 C)] 98.4 F (36.9 C) (01/08 0455) Pulse Rate:  [94-108] 102  (01/08 0455) Resp:  [18-20] 20  (01/08 0455) BP: (96-117)/(61-72) 96/61 mmHg (01/08 0455) SpO2:  [90 %-96 %] 91 % (01/08 0455) Weight:  [61.1 kg (134 lb 11.2 oz)] 61.1 kg (134 lb 11.2 oz) (01/08 0500)  PHYSICAL EXAMINATION: General: WNWDWF , less sob 1-8 Neuro:  Intact HEENT:  No LAN Neck:  No JVD Cardiovascular:  HSR RRR Lungs:  decreased bs 1/2 down bilat Abdomen:  Less  distended no  fuid wave Musculoskeletal:  intact Skin:  warm   Lab 04/03/12 0513 04/02/12 0525 04/01/12 1527  NA 134* 134* 132*  K 3.5 4.0 3.9  CL 98 93* 94*  CO2 27 26 25   BUN 18 17 18   CREATININE 0.81 0.85 0.82  GLUCOSE 128* 103* 100*    Lab 04/03/12 0513 04/02/12 0525 04/01/12 1527  HGB 12.6 13.1 13.6  HCT 37.7 40.1 40.8  WBC 7.4 8.9 10.0  PLT 403* 486* 481*   Ct Abdomen Pelvis Wo Contrast  04/01/2012  *RADIOLOGY REPORT*  Clinical Data:  Pleural effusions.  Bloating.  Increasing shortness of breath.  Evaluate for ovarian pathology.  CT CHEST, ABDOMEN AND PELVIS WITHOUT CONTRAST  Technique:  Multidetector CT imaging of the chest, abdomen and pelvis was performed following the standard protocol without IV contrast.  Comparison:  No priors.  CT  CHEST  Findings:  Mediastinum: Heart size is normal. There is no significant pericardial fluid, thickening or pericardial calcification. No pathologically enlarged mediastinal or hilar lymph nodes. Please note that accurate exclusion of hilar adenopathy is limited on noncontrast CT scans.  Esophagus is unremarkable in appearance.  Lungs/Pleura: Large bilateral pleural effusions.  On the noncontrast images obtained, there is no definite pleural nodule or mass, however, assessment is limited without IV contrast.  These effusions are associated with extensive passive atelectasis in the dependent portions of the lungs, including near complete atelectasis of both lower lobes.  There is also some atelectasis in the right middle lobe.  Musculoskeletal: There are no aggressive appearing lytic or blastic lesions noted in the visualized portions of the skeleton. Bilateral breast implants are incidentally noted.  IMPRESSION:  1.  Large bilateral pleural effusions with extensive passive atelectasis in both lungs.  CT ABDOMEN AND PELVIS  Findings:  Abdomen/Pelvis: 3.7 x 3.1 cm low attenuation lesion in segment 7 of the liver.  Smaller subcentimeter low attenuation lesion is also noted in segment 7 of the liver (image 53 of series 2).  There is some high attenuation material layering dependently in the lumen of the gallbladder, likely represent biliary sludge.  The appearance of the pancreas, spleen, bilateral adrenal glands and bilateral kidneys is unremarkable  on this noncontrast CT examination.  There is a large volume of ascites throughout the peritoneal cavity.  In the pelvis there is a complex heterogeneous appearing soft tissue mass that appears to have multiple internal cystic components and some small amounts of calcium.  This lesion is irregular in shape and therefore difficult to accurately measure, however, it measures at least 12.5 x 12.7 x 11.8 cm. Although this comes in close proximity to the patient's uterus, the  lesion is favored to be separate from the uterus, likely of ovarian origin. No pneumoperitoneum.  No pathologic distension of small bowel.  Musculoskeletal: There are no aggressive appearing lytic or blastic lesions noted in the visualized portions of the skeleton.  IMPRESSION:  1.  Large mass in the pelvis measuring approximately 12.5 x 12.7 x 1.8 cm, presumably a large ovarian malignancy.  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 2.  There are two liver lesions in the right lobe of the liver, as above, which are incompletely characterized on this noncontrast CT examination. 3.  Probable biliary sludge layering dependently in the gallbladder.   Original Report Authenticated By: Trudie Reed, M.D.    Ct Chest Wo Contrast  04/01/2012  *RADIOLOGY REPORT*  Clinical Data:  Pleural effusions.  Bloating.  Increasing shortness of breath.  Evaluate for ovarian pathology.  CT CHEST, ABDOMEN AND PELVIS WITHOUT CONTRAST  Technique:  Multidetector CT imaging of the chest, abdomen and pelvis was performed following the standard protocol without IV contrast.  Comparison:  No priors.  CT CHEST  Findings:  Mediastinum: Heart size is normal. There is no significant pericardial fluid, thickening or pericardial calcification. No pathologically enlarged mediastinal or hilar lymph nodes. Please note that accurate exclusion of hilar adenopathy is limited on noncontrast CT scans.  Esophagus is unremarkable in appearance.  Lungs/Pleura: Large bilateral pleural effusions.  On the noncontrast images obtained, there is no definite pleural nodule or mass, however, assessment is limited without IV contrast.  These effusions are associated with extensive passive atelectasis in the dependent portions of the lungs, including near complete atelectasis of both lower lobes.  There is also some atelectasis in the right middle lobe.  Musculoskeletal: There are no aggressive appearing lytic or blastic lesions noted  in the visualized portions of the skeleton. Bilateral breast implants are incidentally noted.  IMPRESSION:  1.  Large bilateral pleural effusions with extensive passive atelectasis in both lungs.  CT ABDOMEN AND PELVIS  Findings:  Abdomen/Pelvis: 3.7 x 3.1 cm low attenuation lesion in segment 7 of the liver.  Smaller subcentimeter low attenuation lesion is also noted in segment 7 of the liver (image 53 of series 2).  There is some high attenuation material layering dependently in the lumen of the gallbladder, likely represent biliary sludge.  The appearance of the pancreas, spleen, bilateral adrenal glands and bilateral kidneys is unremarkable on this noncontrast CT examination.  There is a large volume of ascites throughout the peritoneal cavity.  In the pelvis there is a complex heterogeneous appearing soft tissue mass that appears to have multiple internal cystic components and some small amounts of calcium.  This lesion is irregular in shape and therefore difficult to accurately measure, however, it measures at least 12.5 x 12.7 x 11.8 cm. Although this comes in close proximity to the patient's uterus, the lesion is favored to be separate from the uterus, likely of ovarian origin. No pneumoperitoneum.  No pathologic distension of small bowel.  Musculoskeletal: There are no  aggressive appearing lytic or blastic lesions noted in the visualized portions of the skeleton.  IMPRESSION:  1.  Large mass in the pelvis measuring approximately 12.5 x 12.7 x 1.8 cm, presumably a large ovarian malignancy.  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 2.  There are two liver lesions in the right lobe of the liver, as above, which are incompletely characterized on this noncontrast CT examination. 3.  Probable biliary sludge layering dependently in the gallbladder.   Original Report Authenticated By: Trudie Reed, M.D.    Ct Angio Chest Pe W/cm &/or Wo Cm  04/01/2012  *RADIOLOGY REPORT*   Clinical Data: Fluid in the lungs.  Abdominal pain.  Short of breath.  Coughing.  Abdominal mass.  Pulmonary embolism.  CT ANGIOGRAPHY CHEST  Technique:  Multidetector CT imaging of the chest using the standard protocol during bolus administration of intravenous contrast. Multiplanar reconstructed images including MIPs were obtained and reviewed to evaluate the vascular anatomy.  Contrast: OMNIPAQUE IOHEXOL 350 MG/ML SOLN  Comparison: CT abdomen 04/01/2012.  Findings: Large bilateral pleural effusions are present.  Negative for pulmonary embolus.  Technically adequate study.  Aorta and branch vessels appear within normal limits.  Relaxation atelectasis affecting all lobes, most prominently involving the lower lobes bilaterally.  No definite pericardial effusion.  No axillary adenopathy.  Trachea and mainstem bronchi appear patent. Calcification is present and atelectatic left upper lobe adjacent to the fissure, probably old granulomatous calcification.  No aggressive osseous lesions are present.  Incidental imaging of the upper abdomen demonstrates a cystic lesion in the right hepatic lobe previously described on CT and probably representing a cyst. Bilateral breast implants are noted.  IMPRESSION: 1.  No pulmonary embolus.  No acute vascular abnormality. 2.  Large left greater than right bilateral pleural effusions with Relaxation/compressive atelectasis.   Original Report Authenticated By: Andreas Newport, M.D.    US Paracentesis  04/02/2012  *RADIOLOGY REPORT*  Clinical Data: Abdominal distension, shortness of breath, ascites noted on recent CT  ULTRASOUND GUIDED PARACENTESIS  Comparison:  None  An ultrasound guided paracentesis was thoroughly discussed with the patient and questions answered.  The benefits, risks, alternatives and complications were also discussed.  The patient understands and wishes to proceed with the procedure.  Written consent was obtained.  Ultrasound was performed to localize and  mark an adequate pocket of fluid in the left lower quadrant of the abdomen.  The area was then prepped and draped in the normal sterile fashion.  1% Lidocaine was used for local anesthesia.  Under ultrasound guidance a 19 gauge Yueh catheter was introduced.  Paracentesis was performed.  The catheter was removed and a dressing applied.  Complications:  None  Findings:  A total of approximately 1.4 liters of amber colored fluid was removed.  A fluid sample was sent for laboratory analysis.  IMPRESSION: Successful ultrasound guided paracentesis yielding 1.4 liters of ascites.  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  We can see prn thoracentesis as out pt - call again if needs as inpt.  Sandrea Hughs, MD Pulmonary and Critical Care Medicine [facility] Cell [phone] After 5:30 PM or weekends, call [phone]

## 2012-04-03 NOTE — Progress Notes (Signed)
The patient is seen briefly today based on the request of Dr. Suanne Marker. She is currently under evaluation for questionable ovarian cancer. The patient underwent ultrasound-guided paracentesis. The final pathology still pending. She is already seen by gynecologic oncology, Dr. Nelly Rout. The plan is dictated in the consult note. They will arrange for the patient followup visit next week after the final diagnosis is available. If she needs chemotherapy before the debulking surgery, Dr. Darrold Span would be happy to see the patient on an outpatient basis for more detailed discussion of her treatment options. Please call if you have any further questions.

## 2012-04-03 NOTE — Progress Notes (Signed)
Arranged for pt to be seen tomorrow, 04/04/12 at 1pm in the GYN Oncology clinic for complete GYN exam by Dr Cleda Mccreedy.  Pt accepts and is glad this can be arranged prior to discharge.  Her RN is present for this visit and will assure pt is on time for appointment.

## 2012-04-04 ENCOUNTER — Inpatient Hospital Stay (HOSPITAL_COMMUNITY): Payer: BC Managed Care – PPO

## 2012-04-04 ENCOUNTER — Encounter: Payer: Self-pay | Admitting: Gynecologic Oncology

## 2012-04-04 LAB — BASIC METABOLIC PANEL
Calcium: 8.2 mg/dL — ABNORMAL LOW (ref 8.4–10.5)
GFR calc Af Amer: >90 mL/min (ref >90)
GFR calc non Af Amer: >90 mL/min (ref >90)
Potassium: 3.3 mEq/L — ABNORMAL LOW (ref 3.5–5.1)
Sodium: 135 mEq/L (ref 135–145)

## 2012-04-04 LAB — CBC
MCH: 28.4 pg (ref 26.0–34.0)
MCHC: 32.3 g/dL (ref 30.0–36.0)
Platelets: 372 10*3/uL (ref 150–400)
RDW: 14 % (ref 11.5–15.5)

## 2012-04-04 LAB — MAGNESIUM: Magnesium: 2 mg/dL (ref 1.5–2.5)

## 2012-04-04 NOTE — Progress Notes (Signed)
[facility] PROGRESS NOTE  Shelley Sanchez JXB:[id number] DOB: [birth year] DOA: 04/01/2012 PCP: Pcp Not In System  Assessment/Plan: Active Problems:  Ascites assoc with Ovarian mass  Bilateral pleural effusion  Pelvic mass  Dyspnea  Hypothyroidism    1. Bilateral pleural effusion: Patient initially presented to [facility] in 12/20013, was referred to Pulmonologist for abnormal CXR with bilateral pleural effusion, and is s/p right thoracocentesis with evacuation of >1.6 L fluid on 03/11/2012. Cytology was negative at that time. She now has a recurrence, shown on CXR 03/28/12, and confirmed by Chest CT scan on 04/01/12. As effusions are quite large. Fortunately, there were no pericardial abnormalities. CTA is negative for PE.  - pleural fluid cytology neg, per ccm repeat thoracocentesis not Needed 1/8 -on 1/8 CCM recommending prn thoracentesis on outpt follow up  2. Asctes/Pelvic mass: Abdominal/Pelvic CT scan, done to evaluate progressive increase in abdominal girth and clinical ascites, revealed a large mass in the pelvis measuring approximately 12.5 x 12.7 x 1.8 cm, presumably a large ovarian malignancy. There is a large volume of ascites, which is presumably malignant. Also, there are two liver lesions in the right lobe of the liver, which are incompletely characterized on noncontrast CT examination. Diagnostic/therapeutic paracentesis was done by IR on 04/02/12, with evacuation of 1.4 L fluid.  Studies are pending. CA-125 tumor marker is 2418.2.  -appreciate GYN-Oncology assisstance.  -Dr Arbutus Ped to see out pt when diagnosis finalized -per radiologist the liver lesion that was targeted for bx is more likely a fluid density and will be low yield if bx, so he recommended waiting on the cytology on peritoneal fluid. -notified surgical onc NP of above, I also called Dr Curly Shores but no response on 1/8 -awaiting cytology on peritoneal fluid -plan for pap smear at cancer center per GYN onc today 1/9,  follow 3. Dyspnea: This likely due to large bilateral pleural effusions and tense ascites, much mprove with thoracocentesis/paracentesis. -continue lasix  -echo with EF 60-65%, trivial pericardial effusion with no tamponade physiology 4. Hypothyroidism: Continued on pre-admission thyroxine replacement therapy.  5.Hypokalemia -replace k   Code Status: Full Code.  Family Communication: directly with pt at bedside Disposition Plan: To be determined.    Brief narrative: 57 year old female, with know history of previous skin cancer s/p excision over a year ago, s/p previous augumentation mammoplasty, hypothyroidism diagnosed 02/2012, who started experiencing progressive shortness of breath on exertion, as well as painless abdominal swelling in October 2013. She was referred to pulmonary clinic 03/07/2012 by Dr Alwyn Ren at [facility] for evaluation of cough and abnormal CXR with bilateral effusions, underwent right thoracocentesis on 03/11/12, with evacuation of > 1.6 liter exudate/monos/neg cytology. She was seen again on 03/28/2012, by Dr Cyril Mourning, with worsening SOB and increasing abdominal girth. CXR revealed recurrent bilateral effusions. Dr Vassie Loll arranged CT chest/ abdomen-pelvis with PO contrast & inflammatory markers ANA, RA factor, with plans for close follow up. Patient had her Chest CT /Abdomional CT on 04/01/2012, and is being admitted for work up and management of abnormal findings. She denies fever or chills, has had a long-standing cough with clear phlegm, and although appetite is preserved, she has not been able to eat much because of abdominal discomfort and has lost about 5 lbs in the past 3 months.    Consultants:  Pulmonary.  Interventional radiology.  GYN-Oncology.   Procedures:  Chest CT san.  Abdominal/Pelvic CT scan.  Chest CT angiogram.  Antibiotics:  N/A.   HPI/Subjective: States  feels much better  today, denies SOB, aslo denies abd pain  Objective: Vital signs in  last 24 hours: Temp:  [98.1 F (36.7 C)-98.7 F (37.1 C)] 98.1 F (36.7 C) (01/09 0557) Pulse Rate:  [85-114] 97  (01/09 0557) Resp:  [18-20] 20  (01/09 0557) BP: (93-106)/(60-66) 106/66 mmHg (01/09 0557) SpO2:  [90 %-97 %] 90 % (01/09 0557) Weight:  [61.4 kg (135 lb 5.8 oz)] 61.4 kg (135 lb 5.8 oz) (01/09 0557) Weight change: 0.301 kg (10.6 oz) Last BM Date: 04/03/12 (small per pt)  Intake/Output from previous day: 01/08 0701 - 01/09 0700 In: 240 [P.O.:240] Out: -      Physical Exam: General: Patient does not appear to be in obvious acute distress. Alert, communicative, fully oriented, talking in complete sentences, not short of breath at rest.  HEENT: No clinical pallor, no jaundice, no conjunctival injection or discharge.   NECK: Supple, JVP not seen, no carotid bruits, no palpable lymphadenopathy, no palpable goiter.  CHEST: clear to auscultation, no wheezes, no crackles.  HEART: Sounds 1 and 2 heard, normal, regular, no murmurs.  ABDOMEN: Soft, mildly distended abdomen.Non-tender. No masses palpable. Bowel sounds heard.  LOWER EXTREMITIES: No edema, palpable peripheral pulses.  MUSCULOSKELETAL SYSTEM: Unremarkable.  CENTRAL NERVOUS SYSTEM: No focal neurologic deficit on gross examination.  Lab Results:  Basename 04/04/12 0504 04/03/12 0513  WBC 7.5 7.4  HGB 12.0 12.6  HCT 37.2 37.7  PLT 372 403*    Basename 04/04/12 0504 04/03/12 0513  NA 135 134*  K 3.3* 3.5  CL 98 98  CO2 29 27  GLUCOSE 105* 128*  BUN 19 18  CREATININE 0.79 0.81  CALCIUM 8.2* 8.4   Recent Results (from the past 240 hour(s))  BODY FLUID CULTURE     Status: Normal (Preliminary result)   Collection Time   04/02/12 11:31 AM      Component Value Range Status Comment   Specimen Description PLEURAL   Final    Special Requests NONE   Final    Gram Stain     Final    Value: FEW WBC PRESENT,BOTH PMN AND MONONUCLEAR     NO ORGANISMS SEEN   Culture NO GROWTH 2 DAYS   Final    Report Status  PENDING   Incomplete      Studies/Results: Dg Chest 2 View  04/04/2012  *RADIOLOGY REPORT*  Clinical Data: Pleural effusions  CHEST - 2 VIEW  Comparison: 03/28/2012.  Findings: Bilateral pleural effusions are stable.  Cardiac silhouette obscured.  Bibasilar volume loss noted.  No pneumothorax.  IMPRESSION: Stable bilateral pleural effusions.   Original Report Authenticated By: Jolaine Click, M.D.    US Paracentesis  04/02/2012  *RADIOLOGY REPORT*  Clinical Data: Abdominal distension, shortness of breath, ascites noted on recent CT  ULTRASOUND GUIDED PARACENTESIS  Comparison:  None  An ultrasound guided paracentesis was thoroughly discussed with the patient and questions answered.  The benefits, risks, alternatives and complications were also discussed.  The patient understands and wishes to proceed with the procedure.  Written consent was obtained.  Ultrasound was performed to localize and mark an adequate pocket of fluid in the left lower quadrant of the abdomen.  The area was then prepped and draped in the normal sterile fashion.  1% Lidocaine was used for local anesthesia.  Under ultrasound guidance a 19 gauge Yueh catheter was introduced.  Paracentesis was performed.  The catheter was removed and a dressing applied.  Complications:  None  Findings:  A total of approximately 1.4  liters of amber colored fluid was removed.  A fluid sample was sent for laboratory analysis.  IMPRESSION: Successful ultrasound guided paracentesis yielding 1.4 liters of ascites.  Read by Brayton El PA-C   Original Report Authenticated By: Tacey Ruiz, MD     Medications: Scheduled Meds:    . furosemide  40 mg Oral Daily  . levothyroxine  50 mcg Oral QAC breakfast  . sodium chloride  3 mL Intravenous Q12H  . sodium chloride  3 mL Intravenous Q12H   Continuous Infusions:  PRN Meds:.sodium chloride, acetaminophen, acetaminophen, morphine injection, ondansetron (ZOFRAN) IV, ondansetron, oxyCODONE, sodium chloride     LOS: 3 days   [facility]  [facility] Pager [phone]. If 8PM-8AM, please contact night-coverage at [email or web address], password [facility] 04/04/2012, 11:25 AM  LOS: 3 days

## 2012-04-04 NOTE — Progress Notes (Signed)
PULMONARY  / CRITICAL CARE MEDICINE  Name: Shelley Sanchez MRN: [id number] DOB: 1956-01-06    LOS: 3  REFERRING PROVIDER:  [facility]  CHIEF COMPLAINT:  SOB HISTORY OF PRESENT ILLNESS:   57 yowf never smoker with excellent baseline activity tolerance able to cross biking in Sept of 2013 referred to pulmonary clinic 03/07/2012 by Dr Alwyn Ren at [facility] for eval of cough and abn cxr with bilateral effusions.  12/16 R thoracentesis > 1.6 liter exudate/ monos/ neg cyt/ tsh slt elevated > rx synthoid 50 mcg per day.  Admitted to [facility] 1/6 with increasing SOB, bilat pleural effusion and abd ct ? Ovarian mass. 1-7  paracentesis per IR. PCCM asked to evaluate.     VITAL SIGNS: Temp:  [98.1 F (36.7 C)-98.7 F (37.1 C)] 98.1 F (36.7 C) (01/09 0557) Pulse Rate:  [97-114] 97  (01/09 0557) Resp:  [18-20] 20  (01/09 0557) BP: (93-106)/(60-66) 106/66 mmHg (01/09 0557) SpO2:  [90 %-97 %] 90 % (01/09 0557) Weight:  [135 lb 5.8 oz (61.4 kg)] 135 lb 5.8 oz (61.4 kg) (01/09 0557)  PHYSICAL EXAMINATION: General: WDWN in NAD Neuro:  Intact HEENT:  No LAN Neck:  No JVD Cardiovascular:  HSR RRR Lungs: resp's even/non-labored, lungs bilaterally diminished posterior lower 1/3 Abdomen:  Less distended no fuid wave Musculoskeletal:  intact Skin:  warm   Lab 04/04/12 0504 04/03/12 0513 04/02/12 0525  NA 135 134* 134*  K 3.3* 3.5 4.0  CL 98 98 93*  CO2 29 27 26   BUN 19 18 17   CREATININE 0.79 0.81 0.85  GLUCOSE 105* 128* 103*    Lab 04/04/12 0504 04/03/12 0513 04/02/12 0525  HGB 12.0 12.6 13.1  HCT 37.2 37.7 40.1  WBC 7.5 7.4 8.9  PLT 372 403* 486*   Dg Chest 2 View  04/04/2012  *RADIOLOGY REPORT*  Clinical Data: Pleural effusions  CHEST - 2 VIEW  Comparison: 03/28/2012.  Findings: Bilateral pleural effusions are stable.  Cardiac silhouette obscured.  Bibasilar volume loss noted.  No pneumothorax.  IMPRESSION: Stable bilateral pleural effusions.   Original Report Authenticated By: Jolaine Click, M.D.      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  Please call if can be of assistance.      Sandrea Hughs, MD Pulmonary and Critical Care Medicine [facility] Cell [phone] After 5:30 PM or weekends, call [phone]

## 2012-04-04 NOTE — Patient Instructions (Signed)
Follow up for surgery on 04/09/12.

## 2012-04-04 NOTE — Progress Notes (Signed)
Consult Note: Gyn-Onc  Shelley Sanchez 57 y.o. female  CC: No chief complaint on file.   HPI: 56 y.o. year old G3 P3 LNMP in early 52's. In October 2013 [name] noted fatigue, and fecal urge incontinence. Also noted increasing abdominal girth. No change in appetite, no nausea or vomiting. Reports SOB onset in November 2013. Denies uterine bleeding, no abdominal pain just discomfort. Was evaluated in urgent care care in December 2013 for malaise and referred to Dr. Sherene Sires (Pulmonology) Thoracocentesis performed prior to Christmas and the cytology was negative for malignancy. Reported labored breathing 03/26/2012 and return of pleural effusions was noted. CT Chest/Abd/Pelvis 04/01/2012 notable for ascites so patient was admitted for evaluation. Paracentesis performed 04/01/2012. Results pending.   Patient reports discomfort chest heaviness And cough when lying flat. Reports 20 pound weight loss in the past year. Was able to ride 50 miles, and swam regularly until September 2013.   CT Chest Abd/Pelvis: 04/01/2012  IMPRESSION:  Large bilateral pleural effusions with extensive passive atelectasis in both lungs.  Abdomen/Pelvis: 3.7 x 3.1 cm low attenuation lesion in segment 7 of the liver. Smaller subcentimeter low attenuation lesion is also noted in segment 7 of the liver (image 53 of series 2) There is a large volume of ascites throughout the peritoneal cavity. In the pelvis there is a complex heterogeneous appearing soft tissue mass that appears to have multiple internal cystic components and some small amounts of calcium that measures at least 12.5 x 12.7 x 11.8 cm.   Interval History:  Patient has undergone a paracentesis which was negative as well as a thoracentesis. CEA is 0.5 and CA-125 2400. She has a history of cervical dysplasia and prior to considering treatment options he did come in today for colposcopy.  Review of Systems  Current Meds:  Facility-Administered Encounter Medications as of  04/04/2012  Medication Dose Route Frequency Provider Last Rate Last Dose  . 0.9 %  sodium chloride infusion  250 mL Intravenous PRN Laveda Norman, MD 10 mL/hr at 04/01/12 1845 10 mL/hr at 04/01/12 1845  . acetaminophen (TYLENOL) tablet 650 mg  650 mg Oral Q6H PRN Laveda Norman, MD       Or  . acetaminophen (TYLENOL) suppository 650 mg  650 mg Rectal Q6H PRN Laveda Norman, MD      . furosemide (LASIX) tablet 40 mg  40 mg Oral Daily Laveda Norman, MD   40 mg at 04/04/12 1030  . levothyroxine (SYNTHROID, LEVOTHROID) tablet 50 mcg  50 mcg Oral QAC breakfast Laveda Norman, MD   50 mcg at 04/04/12 0815  . morphine 2 MG/ML injection 2 mg  2 mg Intravenous Q4H PRN Laveda Norman, MD      . ondansetron [facility]) tablet 4 mg  4 mg Oral Q6H PRN Laveda Norman, MD       Or  . ondansetron [facility]) injection 4 mg  4 mg Intravenous Q6H PRN Laveda Norman, MD      . oxyCODONE (Oxy IR/ROXICODONE) immediate release tablet 5 mg  5 mg Oral Q4H PRN Laveda Norman, MD      . sodium chloride 0.9 % injection 3 mL  3 mL Intravenous Q12H Laveda Norman, MD   3 mL at 04/03/12 2102  . sodium chloride 0.9 % injection 3 mL  3 mL Intravenous Q12H Laveda Norman, MD   3 mL at 04/03/12 1000  . sodium chloride 0.9 % injection 3 mL  3 mL  Intravenous PRN Laveda Norman, MD       Outpatient Encounter Prescriptions as of 04/04/2012  Medication Sig Dispense Refill  . ibuprofen (ADVIL,MOTRIN) 200 MG tablet Take 400 mg by mouth every 6 (six) hours as needed. For pain      . levothyroxine (SYNTHROID) 50 MCG tablet Take 1 tablet (50 mcg total) by mouth daily.  30 tablet  3    Allergy: No Known Allergies  Social Hx:   History   Social History  . Marital Status: Widowed    Spouse Name: N/A    Number of Children: 3  . Years of Education: N/A   Occupational History  . Not on file.   Social History Main Topics  . Smoking status: Never Smoker   . Smokeless tobacco: Never Used  . Alcohol Use: No  . Drug Use: No  . Sexually Active: Yes   Other Topics Concern   . Not on file   Social History Narrative  . No narrative on file    Past Surgical Hx:  Past Surgical History  Procedure Date  . Breast enhancement surgery 2000    Past Medical Hx:  Past Medical History  Diagnosis Date  . Skin cancer     Family Hx:  Family History  Problem Relation Age of Onset  . Melanoma Mother   . Skin cancer Brother     Vitals:  There were no vitals taken for this visit.  Physical Exam:  Thin female in no acute distress.  Abdomen: Abdominal pelvic mass at approximately 14 cm in size. Abdomen soft, nontender and nondistended.  Pelvic: Normal external female genitalia. The patient has fair amount of lateral vaginal wall to send this. The cervix is visualized. Colposcopic by which was performed after the application of acetic acid. No acetowhite epithelial changes. Bimanual examination reveals about a 16 cm abdominal pelvic mass is mobile. There is no nodularity.  Assessment/Plan:  57 year old with abdominal pelvic mass, ascites, and pleural effusions. CA 125 is elevated to about 2400 and CEA is normal. 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 She is scheduled for surgery for next Tuesday. She'll undergo exploratory laparotomy TAH/BSO. The mass be sent for frozen section. If the mass is benign no additional surgical be performed however, if it is consistent with a malignancy she'll need to undergo appropriate staging procedures.   Risks and benefits of surgery including but not limited to bleeding, infection, injury to surrounding organs, and thromboembolic disease were discussed with the patient. At this point I do not know if she'll be an inpatient until the day of surgery of she'll be discharged by her current service. She's not yet been consented for surgery this will need to be done. Surgery will be done next Tuesday with Dr. Nelly Rout who is been made aware focal possibly findings. We spoke to Dr. Sandrea Hughs who feels that from a pulmonary  perspective the patient can undergo intubation for surgery. [name], MD 04/04/2012, 1:42 PM

## 2012-04-05 ENCOUNTER — Inpatient Hospital Stay (HOSPITAL_COMMUNITY): Payer: BC Managed Care – PPO

## 2012-04-05 ENCOUNTER — Encounter (HOSPITAL_COMMUNITY): Payer: Self-pay | Admitting: *Deleted

## 2012-04-05 LAB — BASIC METABOLIC PANEL
CO2: 28 mEq/L (ref 19–32)
Calcium: 8.4 mg/dL (ref 8.4–10.5)
Creatinine, Ser: 0.75 mg/dL (ref 0.50–1.10)
GFR calc non Af Amer: >90 mL/min (ref >90)
Sodium: 131 mEq/L — ABNORMAL LOW (ref 135–145)

## 2012-04-05 LAB — BODY FLUID CULTURE: Culture: NO GROWTH

## 2012-04-05 MED ORDER — POTASSIUM CHLORIDE CRYS ER 20 MEQ PO TBCR
60.0000 meq | EXTENDED_RELEASE_TABLET | Freq: Once | ORAL | Status: AC
  Administered 2012-04-05: 60 meq via ORAL
  Filled 2012-04-05: qty 3

## 2012-04-05 NOTE — Progress Notes (Signed)
[facility] PROGRESS NOTE  Shelley Sanchez ZOX:[id number] DOB: 05/12/1955 DOA: 04/01/2012 PCP: Pcp Not In System  Assessment/Plan: Active Problems:  Ascites assoc with Ovarian mass  Bilateral pleural effusion  Pelvic mass  Dyspnea  Hypothyroidism    1. Bilateral pleural effusion: Patient initially presented to [facility] in 12/[address]13, was referred to Pulmonologist for abnormal CXR with bilateral pleural effusion, and is s/p right thoracocentesis with evacuation of >1.6 L fluid on 03/11/2012. Cytology was negative at that time. She now has a recurrence, shown on CXR 03/28/12, and confirmed by Chest CT scan on 04/01/12. As effusions are quite large. Fortunately, there were no pericardial abnormalities. CTA is negative for PE.  - pleural fluid cytology neg, per ccm repeat thoracocentesis not Needed 1/8 -plan is for repeat thoracentesis per IR today - to tap dry as per ccm recs, then outpt follow up 2. 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 There is a large volume of ascites, which is presumably malignant. Also, there are two liver lesions in the right lobe of the liver, which are incompletely characterized on noncontrast CT examination. Diagnostic/therapeutic paracentesis was done by IR on 04/02/12, with evacuation of 1.4 L fluid.  Studies are pending. CA-125 tumor marker is 2418.2.  -appreciate GYN-Oncology assisstance.  -Dr Arbutus Ped to see out pt when diagnosis finalized -per radiologist the liver lesion that was targeted for bx is more likely a fluid density and will be low yield if bx, so he recommended waiting on the cytology on peritoneal fluid. -notified surgical onc NP of above, I also called Dr Curly Shores but no response on 1/8 -cytology on peritoneal fluid  neg -s/pColposcopy at cancer center per GYN onc 1/9, and plan is for her to have surgery on 1/14>>ex lap - TAH/BSO 3. Dyspnea: This likely due to large bilateral pleural effusions and tense ascites, much mprove with thoracocentesis/paracentesis. -continue lasix  -echo with EF 60-65%, trivial pericardial effusion with no tamponade physiology 4. Hypothyroidism: Continued on pre-admission thyroxine replacement therapy.  5.Hypokalemia -resolved   Code Status: Full Code.  Family Communication: directly with pt at bedside Disposition Plan: lan dc today if remains stable after thoracentesis   Brief narrative: 57 year old female, with know history of previous skin cancer s/p excision over a year ago, s/p previous augumentation mammoplasty, hypothyroidism diagnosed 02/2012, who started experiencing progressive shortness of breath on exertion, as well as painless abdominal swelling in October 2013. She was referred to pulmonary clinic 03/07/2012 by Dr Alwyn Ren at [facility] for evaluation of cough and abnormal CXR with bilateral effusions, underwent right thoracocentesis on 03/11/12, with evacuation of > 1.6 liter exudate/monos/neg cytology. She was seen again on 03/28/2012, by Dr Cyril Mourning, with worsening SOB and increasing abdominal girth. CXR revealed recurrent bilateral effusions. Dr Vassie Loll arranged CT chest/ abdomen-pelvis with PO contrast & inflammatory markers ANA, RA factor, with plans for close follow up. Patient had her Chest CT /Abdomional CT on 04/01/2012, and is being admitted for work up and management of abnormal findings. She denies fever or chills, has had a long-standing cough with clear phlegm, and although appetite is preserved, she has not been able to eat much because of abdominal discomfort and has lost about 5 lbs in the past 3 months.    Consultants:  Pulmonary.  Interventional radiology.  GYN-Oncology.   Procedures:  Chest  CT san.  Abdominal/Pelvic CT scan.  Chest CT  angiogram.  Antibiotics:  N/A.   HPI/Subjective: States  feels much better today, denies SOB, aslo denies abd pain  Objective: Vital signs in last 24 hours: Temp:  [98 F (36.7 C)-98.6 F (37 C)] 98 F (36.7 C) (01/10 1514) Pulse Rate:  [100-105] 105  (01/10 1514) Resp:  [16-20] 18  (01/10 1514) BP: (91-139)/(59-79) 91/75 mmHg (01/10 1514) SpO2:  [93 %-98 %] 98 % (01/10 1514) Weight:  [62.4 kg (137 lb 9.1 oz)] 62.4 kg (137 lb 9.1 oz) (01/10 0624) Weight change: 1 kg (2 lb 3.3 oz) Last BM Date: 04/05/12  Intake/Output from previous day: 01/09 0701 - 01/10 0700 In: 240 [P.O.:240] Out: -  Total I/O In: 360 [P.O.:360] Out: -    Physical Exam: General: Patient does not appear to be in obvious acute distress. Alert, communicative, fully oriented, talking in complete sentences, not short of breath at rest.  HEENT: No clinical pallor, no jaundice, no conjunctival injection or discharge.   NECK: Supple, JVP not seen, no carotid bruits, no palpable lymphadenopathy, no palpable goiter.  CHEST: improved L. Lung field aeration , no wheezes, no crackles.  HEART: Sounds 1 and 2 heard, normal, regular, no murmurs.  ABDOMEN: Soft, mildly distended abdomen.Non-tender. No masses palpable. Bowel sounds heard.  LOWER EXTREMITIES: No edema, palpable peripheral pulses.  MUSCULOSKELETAL SYSTEM: Unremarkable.  CENTRAL NERVOUS SYSTEM: No focal neurologic deficit on gross examination.  Lab Results:  Basename 04/04/12 0504 04/03/12 0513  WBC 7.5 7.4  HGB 12.0 12.6  HCT 37.2 37.7  PLT 372 403*    Basename 04/05/12 0540 04/04/12 0504  NA 131* 135  K 3.9 3.3*  CL 94* 98  CO2 28 29  GLUCOSE 104* 105*  BUN 16 19  CREATININE 0.75 0.79  CALCIUM 8.4 8.2*   Recent Results (from the past 240 hour(s))  BODY FLUID CULTURE     Status: Normal   Collection Time   04/02/12 11:31 AM      Component Value Range Status Comment   Specimen Description PLEURAL   Final    Special Requests NONE   Final     Gram Stain     Final    Value: FEW WBC PRESENT,BOTH PMN AND MONONUCLEAR     NO ORGANISMS SEEN   Culture NO GROWTH 3 DAYS   Final    Report Status 04/05/2012 FINAL   Final      Studies/Results: Dg Chest 1 View  04/05/2012  *RADIOLOGY REPORT*  Clinical Data: History of left pleural effusion post left thoracentesis.  History of coughing after thoracentesis.  CHEST - 1 VIEW  Comparison: 04/04/2012.  Findings: There is marked decrease in the left pleural effusion. History given of left thoracentesis.  There is basilar atelectasis and hazy density.  No pneumothorax is evident.  There is continued right pleural effusion with associated basilar pulmonary opacity and atelectasis.  Cardiac silhouette is not enlarged.  Mediastinal and hilar contours appear stable.  IMPRESSION: Interval decrease in left pleural effusion with residual left basilar atelectasis and hazy opacity. No evidence of left pneumothorax.  No significant change seen in the right pleural effusion with associated basilar pulmonary opacity and atelectasis.   Original Report Authenticated By: Onalee Hua Call    Dg Chest 2 View  04/04/2012  *RADIOLOGY REPORT*  Clinical Data: Pleural effusions  CHEST - 2 VIEW  Comparison: 03/28/2012.  Findings: Bilateral pleural effusions are stable.  Cardiac silhouette obscured.  Bibasilar volume loss  noted.  No pneumothorax.  IMPRESSION: Stable bilateral pleural effusions.   Original Report Authenticated By: Jolaine Click, M.D.    US Thoracentesis Asp Pleural Space W/img Guide  04/05/2012  *RADIOLOGY REPORT*  Clinical Data:  Left pleural effusion  ULTRASOUND GUIDED left THORACENTESIS  Comparison:  Previous thoracentesis  An ultrasound guided thoracentesis was thoroughly discussed with the patient and questions answered.  The benefits, risks, alternatives and complications were also discussed.  The patient understands and wishes to proceed with the procedure.  Written consent was obtained.  Ultrasound was performed to  localize and mark an adequate pocket of fluid in the left chest.  The area was then prepped and draped in the normal sterile fashion.  1% Lidocaine was used for local anesthesia.  Under ultrasound guidance a 19 gauge Yueh catheter was introduced.  Thoracentesis was performed.  The catheter was removed and a dressing applied.  Complications:  None  Findings: A total of approximately 2 liters of dark yellow fluid was removed. A fluid sample was not sent for laboratory analysis.  IMPRESSION: Successful ultrasound guided left thoracentesis yielding 2 liters of of pleural fluid.  Read by: Ralene Muskrat, P.A.-C   Original Report Authenticated By: Irish Lack, M.D.     Medications: Scheduled Meds:    . furosemide  40 mg Oral Daily  . levothyroxine  50 mcg Oral QAC breakfast  . sodium chloride  3 mL Intravenous Q12H  . sodium chloride  3 mL Intravenous Q12H   Continuous Infusions:  PRN Meds:.sodium chloride, acetaminophen, acetaminophen, morphine injection, ondansetron (ZOFRAN) IV, ondansetron, oxyCODONE, sodium chloride    LOS: 4 days   [facility]  [facility] Pager [phone]. If 8PM-8AM, please contact night-coverage at [email or web address], password [email or web address] 04/05/2012, 3:34 PM  LOS: 4 days

## 2012-04-05 NOTE — Procedures (Signed)
US guided L thora  2 L dark yellow fluid Pt did well  cxr Neg for ptx

## 2012-04-05 NOTE — Progress Notes (Signed)
SPOKE WITH ANESTHESIOLOGIST DR. CARIGNAN REGARDING PT'S SURGERY ON Tuesday- HE HAS ALREADY TALKED WITH NANCY WILKINSON RN AND AWARE OF PT'S HISTORY AND SAYS NOT TO REPEAT ANY LABS, CXR OR EKG--ALL REPORTS IN EPIC. SPOKE WITH PT BY PHONE--SHE STATES SHE HAS NOT YET BEEN DISCHARGED FROM [facility]--NOT FEELING WELL AFTER HER PROCEDURE TODAY TO DRAW OFF FLUID--BUT SHE ANTICIPATES BEING DISCHARGED.  PT AWARE OF HER SURGERY BEING SCHEDULED FOR Tuesday 1/14--PREOP INSTRUCTIONS REVIEWED--CHLORHEXIDENE INSTRUCTIONS AND PRECAUTIONS GIVEN.

## 2012-04-06 MED ORDER — OXYCODONE HCL 5 MG PO TABS: 5.0000 mg | ORAL_TABLET | ORAL | Status: DC | PRN

## 2012-04-06 MED ORDER — POTASSIUM CHLORIDE ER 10 MEQ PO TBCR: 20.0000 meq | EXTENDED_RELEASE_TABLET | Freq: Two times a day (BID) | ORAL | Status: DC

## 2012-04-06 MED ORDER — FUROSEMIDE 40 MG PO TABS: 40.0000 mg | ORAL_TABLET | Freq: Every day | ORAL | Status: DC

## 2012-04-06 NOTE — Discharge Summary (Signed)
Physician Discharge Summary  Esly Selvage AVW:[id number] DOB: [birth year] DOA: 04/01/2012  PCP: Pcp Not In System  Admit date: 04/01/2012 Discharge date: 04/06/2012  Time spent: >77minutes  Recommendations for Outpatient Follow-up:      Follow-up Information    Please follow up. (Followup for surgery on 1/14 per Dr. Nelly Rout as directed)       Follow up with Sandrea Hughs, MD. (Call for appointment upon discharge)    Contact information:   [address] [place] [address] [phone]        Dr. Darrold Span -as needed only, pending biopsy   Discharge Diagnoses:  Active Problems:  Ascites assoc with Ovarian mass  Bilateral pleural effusion  Pelvic mass  Dyspnea  Hypothyroidism   Discharge Condition: Improved/stable  Diet recommendation: Regular  Filed Weights   04/04/12 0557 04/05/12 0624 04/06/12 0700  Weight: 61.4 kg (135 lb 5.8 oz) 62.4 kg (137 lb 9.1 oz) 59.4 kg (130 lb 15.3 oz)    History of present illness:  57 year old female, with know history of previous skin cancer s/p excision over a year ago, s/p previous augumentation mammoplasty, hypothyroidism diagnosed 02/2012, who started experiencing progressive shortness of breath on exertion, as well as painless abdominal swelling in October 2013. She was referred to pulmonary clinic 03/07/2012 by Dr Alwyn Ren at [facility] for evaluation of cough and abnormal CXR with bilateral effusions, underwent right thoracocentesis on 03/11/12, with evacuation of > 1.6 liter exudate/monos/neg cytology. She was seen again on 03/28/2012, by Dr Cyril Mourning, with worsening SOB and increasing abdominal girth. CXR revealed recurrent bilateral effusions. Dr Vassie Loll arranged CT chest/ abdomen-pelvis with PO contrast & inflammatory markers ANA, RA factor, with plans for close follow up. Patient had her Chest CT /Abdomional CT on 04/01/2012, and is being admitted for work up and management of abnormal findings. She denies fever or  chills, has had a long-standing cough with clear phlegm, and although appetite is preserved, she has not been able to eat much because of abdominal discomfort and has lost about 5 lbs in the past 3 months   Hospital Course:  1. Bilateral pleural effusion: As discussed above Patient initially presented to [facility] in 12/[address]13, was referred to Pulmonologist for abnormal CXR with bilateral pleural effusion, and she followed up and had right thoracocentesis with evacuation of >1.6 L fluid on 03/11/2012. Cytology was negative at that time.  -She subsequently presented on 1/6 with progressive shortness of breath and abdominal swelling>>> CT scan chest revealed large bilateral pleural effusions with extensive passive atelectasis in both lungs.  Fortunately, there were no pericardial abnormalities. CTA is negative for PE. Pulmonology was consulted and saw patient in the hospital.  - pleural fluid cytology from 12/16 came back negative -Left thoracentesis was done per IR on 1/10, 2 L of pleural fluid removed- to tap dry as per ccm recs,  -She improved clinically, pulmonary recommended outpatient followup. 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 2. 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 There is a large volume of ascites, was noted. Also, there are two liver lesions in the right lobe of the liver, which are incompletely characterized on noncontrast CT examination. Diagnostic/therapeutic paracentesis was done by IR on 04/02/12,  with evacuation of 1.4 L fluid. Studies are pending. CA-125 tumor marker is 2418.2.  -per radiologist the liver lesion that was targeted for bx is more likely a fluid density and will be low yield if  bx, so he recommended waiting on the cytology on peritoneal fluid.  -Peritoneal fluid cytology came back negative. -She was set up for Colposcopy at cancer center per GYN onc 1/9 and this was done, and plan is for her to have surgery on 1/14>>ex lap - TAH/BSO  -GYN oncology was consulted and Dr. Nelly Rout and saw patient in the hospital -Dr Arbutus Ped was consulted and recommended for patient to follow up outpatient with oncology-Dr. Darrold Span if a diagnosis of cancer was made. 3. Dyspnea: This likely due to large bilateral pleural effusions and tense ascites, much mprove with thoracocentesis/paracentesis. -continue lasix  -echo with EF 60-65%, trivial pericardial effusion with no tamponade physiology  4. Hypothyroidism: Continued on pre-admission thyroxine replacement therapy.  5.Hypokalemia  -resolved, her potassium was replaced to the hospital.   Procedures:  Colposcopy at cancer Center on 1/9  Left Thoracentesis-on 1/10   Consultations:  GYN oncology-Dr. Nelly Rout  Pulmonology-Dr. Wert  Discharge Exam: Filed Vitals:   04/05/12 1514 04/05/12 2255 04/06/12 0556 04/06/12 0700  BP: 91/75 99/55 97/67    Pulse: 105 98 100   Temp: 98 F (36.7 C) 98.6 F (37 C) 98.1 F (36.7 C)   TempSrc: Oral Oral Oral   Resp: 18 16 16    Height:      Weight:    59.4 kg (130 lb 15.3 oz)  SpO2: 98% 92% 93%    Physical Exam:  General: Patient does not appear to be in obvious acute distress. Alert, communicative, fully oriented, talking in complete sentences, not short of breath at rest.  HEENT: No clinical pallor, no jaundice, no conjunctival injection or discharge.  NECK: Supple, JVP not seen, no carotid bruits, no palpable lymphadenopathy, no palpable goiter.  CHEST: improved L. Lung field aeration , no wheezes, no crackles.  HEART: Sounds 1 and 2 heard, normal, regular, no murmurs.  ABDOMEN: Soft, mildly distended abdomen.Non-tender. No masses palpable. Bowel sounds heard.  LOWER EXTREMITIES: No  edema, palpable peripheral pulses.  MUSCULOSKELETAL SYSTEM: Unremarkable.  CENTRAL NERVOUS SYSTEM: No focal neurologic deficit on gross examination.       Discharge Instructions  Discharge Orders    Future Appointments: Provider: Department: Dept Phone: Center:   04/15/2012 11:45 AM Nyoka Cowden, MD [facility] [phone] None     Future Orders Please Complete By Expires   Diet general      Increase activity slowly          Medication List     As of 04/06/2012  1:34 PM    TAKE these medications         furosemide 40 MG tablet   Commonly known as: LASIX   Take 1 tablet (40 mg total) by mouth daily.      ibuprofen 200 MG tablet   Commonly known as: ADVIL,MOTRIN   Take 400 mg by mouth every 6 (six) hours as needed. For pain      levothyroxine 50 MCG tablet   Commonly known as: SYNTHROID, LEVOTHROID   Take 1 tablet (50 mcg total) by mouth daily.      oxyCODONE 5 MG immediate release tablet   Commonly known as: Oxy IR/ROXICODONE   Take 1 tablet (5 mg total) by mouth every 4 (four) hours as needed.      potassium  chloride 10 MEQ tablet   Commonly known as: K-DUR   Take 2 tablets (20 mEq total) by mouth 2 (two) times daily.           Follow-up Information    Please follow up. (Followup for surgery on 1/14 per Dr. Nelly Rout as directed)       Follow up with Sandrea Hughs, MD. (Call for appointment upon discharge)    Contact information:   [address] [place] [address] [phone]           The results of significant diagnostics from this hospitalization (including imaging, microbiology, ancillary and laboratory) are listed below for reference.    Significant Diagnostic Studies: Ct Abdomen Pelvis Wo Contrast  04/01/2012  *RADIOLOGY REPORT*  Clinical Data:  Pleural effusions.  Bloating.  Increasing shortness of breath.  Evaluate for ovarian pathology.  CT CHEST, ABDOMEN AND PELVIS WITHOUT CONTRAST  Technique:   Multidetector CT imaging of the chest, abdomen and pelvis was performed following the standard protocol without IV contrast.  Comparison:  No priors.  CT CHEST  Findings:  Mediastinum: Heart size is normal. There is no significant pericardial fluid, thickening or pericardial calcification. No pathologically enlarged mediastinal or hilar lymph nodes. Please note that accurate exclusion of hilar adenopathy is limited on noncontrast CT scans.  Esophagus is unremarkable in appearance.  Lungs/Pleura: Large bilateral pleural effusions.  On the noncontrast images obtained, there is no definite pleural nodule or mass, however, assessment is limited without IV contrast.  These effusions are associated with extensive passive atelectasis in the dependent portions of the lungs, including near complete atelectasis of both lower lobes.  There is also some atelectasis in the right middle lobe.  Musculoskeletal: There are no aggressive appearing lytic or blastic lesions noted in the visualized portions of the skeleton. Bilateral breast implants are incidentally noted.  IMPRESSION:  1.  Large bilateral pleural effusions with extensive passive atelectasis in both lungs.  CT ABDOMEN AND PELVIS  Findings:  Abdomen/Pelvis: 3.7 x 3.1 cm low attenuation lesion in segment 7 of the liver.  Smaller subcentimeter low attenuation lesion is also noted in segment 7 of the liver (image 53 of series 2).  There is some high attenuation material layering dependently in the lumen of the gallbladder, likely represent biliary sludge.  The appearance of the pancreas, spleen, bilateral adrenal glands and bilateral kidneys is unremarkable on this noncontrast CT examination.  There is a large volume of ascites throughout the peritoneal cavity.  In the pelvis there is a complex heterogeneous appearing soft tissue mass that appears to have multiple internal cystic components and some small amounts of calcium.  This lesion is irregular in shape and therefore  difficult to accurately measure, however, it measures at least 12.5 x 12.7 x 11.8 cm. Although this comes in close proximity to the patient's uterus, the lesion is favored to be separate from the uterus, likely of ovarian origin. No pneumoperitoneum.  No pathologic distension of small bowel.  Musculoskeletal: There are no aggressive appearing lytic or blastic lesions noted in the visualized portions of the skeleton.  IMPRESSION:  1.  Large mass in the pelvis measuring approximately 12.5 x 12.7 x 1.8 cm, presumably a large ovarian malignancy.  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 2.  There are two liver lesions in the right lobe of the liver, as above, which are incompletely characterized on this noncontrast CT examination. 3.  Probable biliary sludge layering dependently in the gallbladder.   Original Report Authenticated By: Trudie Reed, M.D.    Dg Chest 1 View  04/05/2012  *RADIOLOGY REPORT*  Clinical Data: History of left pleural effusion post left thoracentesis.  History of coughing after thoracentesis.  CHEST - 1 VIEW  Comparison: 04/04/2012.  Findings: There is marked decrease in the left pleural effusion. History given of left thoracentesis.  There is basilar atelectasis and hazy density.  No pneumothorax is evident.  There is continued right pleural effusion with associated basilar pulmonary opacity and atelectasis.  Cardiac silhouette is not enlarged.  Mediastinal and hilar contours appear stable.  IMPRESSION: Interval decrease in left pleural effusion with residual left basilar atelectasis and hazy opacity. No evidence of left pneumothorax.  No significant change seen in the right pleural effusion with associated basilar pulmonary opacity and atelectasis.   Original Report Authenticated By: Onalee Hua Call    Dg Chest 1 View  03/11/2012  *RADIOLOGY REPORT*  Clinical Data: Status post right thoracentesis.  CHEST - 1 VIEW  Comparison: Radiographs 03/06/2012.   Findings: The right-sided pleural effusion has decreased in volume. The left-sided pleural effusion has increased in volume.  There is no pneumothorax.  Bibasilar pulmonary opacities persist.  The visualized heart size and mediastinal contours are stable.  IMPRESSION:  1.  No evidence of pneumothorax following right-sided thoracentesis.  The right-sided pleural effusion has decreased in volume. 2.  Increased left pleural effusion.   Original Report Authenticated By: Carey Bullocks, M.D.    Dg Chest 2 View  04/04/2012  *RADIOLOGY REPORT*  Clinical Data: Pleural effusions  CHEST - 2 VIEW  Comparison: 03/28/2012.  Findings: Bilateral pleural effusions are stable.  Cardiac silhouette obscured.  Bibasilar volume loss noted.  No pneumothorax.  IMPRESSION: Stable bilateral pleural effusions.   Original Report Authenticated By: Jolaine Click, M.D.    Dg Chest 2 View  03/28/2012  *RADIOLOGY REPORT*  Clinical Data: Bilateral pleural effusions.  CHEST - 2 VIEW  Comparison: Chest x-ray 03/18/2012.  Findings: Moderate bilateral pleural effusions (left greater than right) are similar to the prior examination.  There are associated bibasilar opacities which may reflect areas of atelectasis and/or consolidation.  No evidence of pulmonary edema.  Cardiac silhouette is largely obscured.  Upper mediastinal contours are within normal limits.  IMPRESSION: 1.  Persistent moderate bilateral pleural effusions with probable bibasilar atelectasis.   Original Report Authenticated By: Trudie Reed, M.D.    Dg Chest 2 View  03/18/2012  *RADIOLOGY REPORT*  Clinical Data: Follow up pleural effusion, shortness of breath  CHEST - 2 VIEW  Comparison: Portable chest x-ray of 03/11/2012  Findings: There are still bilateral pleural effusions present, but the left effusion may have decreased somewhat in volume.  Bibasilar linear atelectasis remains.  Heart size is stable.  No bony abnormality is seen.  IMPRESSION: Persistent bilateral pleural  effusions with the left effusion having decreased somewhat in volume.  Bibasilar atelectasis.   Original Report Authenticated By: Dwyane Dee, M.D.    Ct Chest Wo Contrast  04/01/2012  *RADIOLOGY REPORT*  Clinical Data:  Pleural effusions.  Bloating.  Increasing shortness of breath.  Evaluate for ovarian pathology.  CT CHEST, ABDOMEN AND PELVIS WITHOUT CONTRAST  Technique:  Multidetector CT imaging of the chest, abdomen and pelvis was performed following the standard protocol without IV contrast.  Comparison:  No priors.  CT CHEST  Findings:  Mediastinum: Heart size is normal. There is no significant pericardial fluid, thickening or pericardial calcification.  No pathologically enlarged mediastinal or hilar lymph nodes. Please note that accurate exclusion of hilar adenopathy is limited on noncontrast CT scans.  Esophagus is unremarkable in appearance.  Lungs/Pleura: Large bilateral pleural effusions.  On the noncontrast images obtained, there is no definite pleural nodule or mass, however, assessment is limited without IV contrast.  These effusions are associated with extensive passive atelectasis in the dependent portions of the lungs, including near complete atelectasis of both lower lobes.  There is also some atelectasis in the right middle lobe.  Musculoskeletal: There are no aggressive appearing lytic or blastic lesions noted in the visualized portions of the skeleton. Bilateral breast implants are incidentally noted.  IMPRESSION:  1.  Large bilateral pleural effusions with extensive passive atelectasis in both lungs.  CT ABDOMEN AND PELVIS  Findings:  Abdomen/Pelvis: 3.7 x 3.1 cm low attenuation lesion in segment 7 of the liver.  Smaller subcentimeter low attenuation lesion is also noted in segment 7 of the liver (image 53 of series 2).  There is some high attenuation material layering dependently in the lumen of the gallbladder, likely represent biliary sludge.  The appearance of the pancreas, spleen,  bilateral adrenal glands and bilateral kidneys is unremarkable on this noncontrast CT examination.  There is a large volume of ascites throughout the peritoneal cavity.  In the pelvis there is a complex heterogeneous appearing soft tissue mass that appears to have multiple internal cystic components and some small amounts of calcium.  This lesion is irregular in shape and therefore difficult to accurately measure, however, it measures at least 12.5 x 12.7 x 11.8 cm. Although this comes in close proximity to the patient's uterus, the lesion is favored to be separate from the uterus, likely of ovarian origin. No pneumoperitoneum.  No pathologic distension of small bowel.  Musculoskeletal: There are no aggressive appearing lytic or blastic lesions noted in the visualized portions of the skeleton.  IMPRESSION:  1.  Large mass in the pelvis measuring approximately 12.5 x 12.7 x 1.8 cm, presumably a large ovarian malignancy.  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 2.  There are two liver lesions in the right lobe of the liver, as above, which are incompletely characterized on this noncontrast CT examination. 3.  Probable biliary sludge layering dependently in the gallbladder.   Original Report Authenticated By: Trudie Reed, M.D.    Ct Angio Chest Pe W/cm &/or Wo Cm  04/01/2012  *RADIOLOGY REPORT*  Clinical Data: Fluid in the lungs.  Abdominal pain.  Short of breath.  Coughing.  Abdominal mass.  Pulmonary embolism.  CT ANGIOGRAPHY CHEST  Technique:  Multidetector CT imaging of the chest using the standard protocol during bolus administration of intravenous contrast. Multiplanar reconstructed images including MIPs were obtained and reviewed to evaluate the vascular anatomy.  Contrast: OMNIPAQUE IOHEXOL 350 MG/ML SOLN  Comparison: CT abdomen 04/01/2012.  Findings: Large bilateral pleural effusions are present.  Negative for pulmonary embolus.  Technically adequate study.  Aorta  and branch vessels appear within normal limits.  Relaxation atelectasis affecting all lobes, most prominently involving the lower lobes bilaterally.  No definite pericardial effusion.  No axillary adenopathy.  Trachea and mainstem bronchi appear patent. Calcification is present and atelectatic left upper lobe adjacent to the fissure, probably old granulomatous calcification.  No aggressive osseous lesions are present.  Incidental imaging of the upper abdomen demonstrates a cystic lesion in the right hepatic lobe previously described on CT and probably representing a cyst.  Bilateral breast implants are noted.  IMPRESSION: 1.  No pulmonary embolus.  No acute vascular abnormality. 2.  Large left greater than right bilateral pleural effusions with Relaxation/compressive atelectasis.   Original Report Authenticated By: Andreas Newport, M.D.    US Paracentesis  04/02/2012  *RADIOLOGY REPORT*  Clinical Data: Abdominal distension, shortness of breath, ascites noted on recent CT  ULTRASOUND GUIDED PARACENTESIS  Comparison:  None  An ultrasound guided paracentesis was thoroughly discussed with the patient and questions answered.  The benefits, risks, alternatives and complications were also discussed.  The patient understands and wishes to proceed with the procedure.  Written consent was obtained.  Ultrasound was performed to localize and mark an adequate pocket of fluid in the left lower quadrant of the abdomen.  The area was then prepped and draped in the normal sterile fashion.  1% Lidocaine was used for local anesthesia.  Under ultrasound guidance a 19 gauge Yueh catheter was introduced.  Paracentesis was performed.  The catheter was removed and a dressing applied.  Complications:  None  Findings:  A total of approximately 1.4 liters of amber colored fluid was removed.  A fluid sample was sent for laboratory analysis.  IMPRESSION: Successful ultrasound guided paracentesis yielding 1.4 liters of ascites.  Read by Brayton El PA-C   Original Report Authenticated By: Tacey Ruiz, MD    US Thoracentesis Asp Pleural Space W/img Guide  04/05/2012  *RADIOLOGY REPORT*  Clinical Data:  Left pleural effusion  ULTRASOUND GUIDED left THORACENTESIS  Comparison:  Previous thoracentesis  An ultrasound guided thoracentesis was thoroughly discussed with the patient and questions answered.  The benefits, risks, alternatives and complications were also discussed.  The patient understands and wishes to proceed with the procedure.  Written consent was obtained.  Ultrasound was performed to localize and mark an adequate pocket of fluid in the left chest.  The area was then prepped and draped in the normal sterile fashion.  1% Lidocaine was used for local anesthesia.  Under ultrasound guidance a 19 gauge Yueh catheter was introduced.  Thoracentesis was performed.  The catheter was removed and a dressing applied.  Complications:  None  Findings: A total of approximately 2 liters of dark yellow fluid was removed. A fluid sample was not sent for laboratory analysis.  IMPRESSION: Successful ultrasound guided left thoracentesis yielding 2 liters of of pleural fluid.  Read by: Ralene Muskrat, P.A.-C   Original Report Authenticated By: Irish Lack, M.D.    US Thoracentesis Asp Pleural Space W/img Guide  03/11/2012  *RADIOLOGY REPORT*  Clinical Data:  Bilateral pleural effusions, dyspnea.  Request is made for diagnostic and therapeutic right thoracentesis.  ULTRASOUND GUIDED DIAGNOSTIC AND THERAPEUTIC RIGHT  THORACENTESIS  An ultrasound guided thoracentesis was thoroughly discussed with the patient and questions answered.  The benefits, risks, alternatives and complications were also discussed.  The patient understands and wishes to proceed with the procedure.  Written consent was obtained.  Ultrasound was performed to localize and mark an adequate pocket of fluid in the right chest.  The area was then prepped and draped in the normal sterile  fashion.  1% Lidocaine was used for local anesthesia.  Under ultrasound guidance a 19 gauge Yueh catheter was introduced.  Thoracentesis was performed.  The catheter was removed and a dressing applied.  Complications:  none  Findings: A total of approximately 1.6 liters of turbid,amber fluid was removed. A fluid sample was sent for laboratory analysis.  IMPRESSION: Successful ultrasound guided diagnostic and  therapeutic right thoracentesis yielding 1.6 liters of pleural fluid.  Read by: Jeananne Rama, P.A.-C   Original Report Authenticated By: D. Andria Rhein, MD     Microbiology: Recent Results (from the past 240 hour(s))  BODY FLUID CULTURE     Status: Normal   Collection Time   04/02/12 11:31 AM      Component Value Range Status Comment   Specimen Description PLEURAL   Final    Special Requests NONE   Final    Gram Stain     Final    Value: FEW WBC PRESENT,BOTH PMN AND MONONUCLEAR     NO ORGANISMS SEEN   Culture NO GROWTH 3 DAYS   Final    Report Status 04/05/2012 FINAL   Final      Labs: Basic Metabolic Panel:  Lab 04/05/12 5784 04/04/12 0504 04/03/12 0513 04/02/12 0525 04/01/12 1527  NA 131* 135 134* 134* 132*  K 3.9 3.3* 3.5 4.0 3.9  CL 94* 98 98 93* 94*  CO2 28 29 27 26 25   GLUCOSE 104* 105* 128* 103* 100*  BUN 16 19 18 17 18   CREATININE 0.75 0.79 0.81 0.85 0.82  CALCIUM 8.4 8.2* 8.4 9.0 8.9  MG -- 2.0 -- -- --  PHOS -- 4.0 -- -- --   Liver Function Tests:  Lab 04/03/12 0513 04/02/12 0525 04/01/12 1527  AST 16 18 20   ALT 7 7 6   ALKPHOS 63 70 74  BILITOT 0.3 0.4 0.4  PROT 6.4 7.1 7.5  ALBUMIN 2.5* 2.9* 3.0*   No results found for this basename: LIPASE:5,AMYLASE:5 in the last 168 hours No results found for this basename: AMMONIA:5 in the last 168 hours CBC:  Lab 04/04/12 0504 04/03/12 0513 04/02/12 0525 04/01/12 1527  WBC 7.5 7.4 8.9 10.0  NEUTROABS -- -- -- --  HGB 12.0 12.6 13.1 13.6  HCT 37.2 37.7 40.1 40.8  MCV 88.2 86.7 87.9 87.7  PLT 372 403* 486* 481*    Cardiac Enzymes: No results found for this basename: CKTOTAL:5,CKMB:5,CKMBINDEX:5,TROPONINI:5 in the last 168 hours BNP: BNP (last 3 results)  Basename 03/07/12 1138  PROBNP 75.0   CBG: No results found for this basename: GLUCAP:5 in the last 168 hours     Signed:  Raetta Agostinelli C  [facility] 04/06/2012, 1:34 PM

## 2012-04-08 ENCOUNTER — Other Ambulatory Visit (HOSPITAL_COMMUNITY): Payer: Self-pay | Admitting: Gynecologic Oncology

## 2012-04-08 ENCOUNTER — Encounter (HOSPITAL_COMMUNITY): Payer: Self-pay | Admitting: Pharmacy Technician

## 2012-04-09 ENCOUNTER — Inpatient Hospital Stay (HOSPITAL_COMMUNITY)
Admission: RE | Admit: 2012-04-09 | Discharge: 2012-04-13 | DRG: 357 | Disposition: A | Discharge status: Home / Self Care | Payer: BC Managed Care – PPO | Source: Ambulatory Visit | Attending: Obstetrics & Gynecology | Admitting: Obstetrics & Gynecology

## 2012-04-09 ENCOUNTER — Encounter (HOSPITAL_COMMUNITY)
Admission: RE | Disposition: A | Discharge status: Home / Self Care | Payer: Self-pay | Source: Ambulatory Visit | Attending: Obstetrics & Gynecology

## 2012-04-09 ENCOUNTER — Encounter (HOSPITAL_COMMUNITY): Payer: Self-pay | Admitting: Anesthesiology

## 2012-04-09 ENCOUNTER — Inpatient Hospital Stay (HOSPITAL_COMMUNITY): Payer: BC Managed Care – PPO | Admitting: Anesthesiology

## 2012-04-09 ENCOUNTER — Encounter (HOSPITAL_COMMUNITY): Payer: Self-pay | Admitting: *Deleted

## 2012-04-09 DIAGNOSIS — J9819 Other pulmonary collapse: Secondary | ICD-10-CM | POA: Diagnosis present

## 2012-04-09 DIAGNOSIS — D62 Acute posthemorrhagic anemia: Secondary | ICD-10-CM | POA: Diagnosis not present

## 2012-04-09 DIAGNOSIS — K7689 Other specified diseases of liver: Secondary | ICD-10-CM | POA: Diagnosis present

## 2012-04-09 DIAGNOSIS — C50919 Malignant neoplasm of unspecified site of unspecified female breast: Secondary | ICD-10-CM | POA: Diagnosis present

## 2012-04-09 DIAGNOSIS — Z85828 Personal history of other malignant neoplasm of skin: Secondary | ICD-10-CM

## 2012-04-09 DIAGNOSIS — C569 Malignant neoplasm of unspecified ovary: Principal | ICD-10-CM | POA: Diagnosis present

## 2012-04-09 DIAGNOSIS — J90 Pleural effusion, not elsewhere classified: Secondary | ICD-10-CM | POA: Diagnosis present

## 2012-04-09 DIAGNOSIS — C786 Secondary malignant neoplasm of retroperitoneum and peritoneum: Secondary | ICD-10-CM | POA: Diagnosis present

## 2012-04-09 DIAGNOSIS — R188 Other ascites: Secondary | ICD-10-CM | POA: Diagnosis present

## 2012-04-09 DIAGNOSIS — C7982 Secondary malignant neoplasm of genital organs: Secondary | ICD-10-CM | POA: Diagnosis present

## 2012-04-09 DIAGNOSIS — N1330 Unspecified hydronephrosis: Secondary | ICD-10-CM | POA: Diagnosis present

## 2012-04-09 DIAGNOSIS — C779 Secondary and unspecified malignant neoplasm of lymph node, unspecified: Secondary | ICD-10-CM | POA: Diagnosis present

## 2012-04-09 DIAGNOSIS — Z87410 Personal history of cervical dysplasia: Secondary | ICD-10-CM

## 2012-04-09 DIAGNOSIS — R152 Fecal urgency: Secondary | ICD-10-CM | POA: Diagnosis present

## 2012-04-09 DIAGNOSIS — R1900 Intra-abdominal and pelvic swelling, mass and lump, unspecified site: Secondary | ICD-10-CM

## 2012-04-09 HISTORY — PX: ABDOMINAL HYSTERECTOMY: SHX81

## 2012-04-09 HISTORY — PX: LAPAROTOMY: SHX154

## 2012-04-09 HISTORY — DX: Other ascites: R18.8

## 2012-04-09 LAB — SURGICAL PCR SCREEN: Staphylococcus aureus: NEGATIVE

## 2012-04-09 LAB — ABO/RH: ABO/RH(D): B NEG

## 2012-04-09 SURGERY — LAPAROTOMY, EXPLORATORY
Anesthesia: General | Site: Abdomen | Wound class: Clean

## 2012-04-09 MED ORDER — CEFAZOLIN SODIUM-DEXTROSE 2-3 GM-% IV SOLR
2.0000 g | INTRAVENOUS | Status: AC
  Administered 2012-04-09: 2 g via INTRAVENOUS

## 2012-04-09 MED ORDER — LACTATED RINGERS IV SOLN
INTRAVENOUS | Status: DC
  Administered 2012-04-09: 1000 mL via INTRAVENOUS

## 2012-04-09 MED ORDER — ACETAMINOPHEN 10 MG/ML IV SOLN
INTRAVENOUS | Status: DC | PRN
  Administered 2012-04-09: 1000 mg via INTRAVENOUS

## 2012-04-09 MED ORDER — OXYCODONE-ACETAMINOPHEN 5-325 MG PO TABS
1.0000 | ORAL_TABLET | ORAL | Status: DC | PRN
  Administered 2012-04-11 – 2012-04-13 (×7): 2 via ORAL
  Filled 2012-04-09 (×8): qty 2

## 2012-04-09 MED ORDER — METOCLOPRAMIDE HCL 5 MG/ML IJ SOLN
INTRAMUSCULAR | Status: DC | PRN
  Administered 2012-04-09: 10 mg via INTRAVENOUS

## 2012-04-09 MED ORDER — DEXAMETHASONE SODIUM PHOSPHATE 10 MG/ML IJ SOLN
INTRAMUSCULAR | Status: DC | PRN
  Administered 2012-04-09: 5 mg via INTRAVENOUS

## 2012-04-09 MED ORDER — MEPERIDINE HCL 50 MG/ML IJ SOLN: 6.2500 mg | INTRAMUSCULAR | Status: DC | PRN

## 2012-04-09 MED ORDER — PROPOFOL 10 MG/ML IV BOLUS
INTRAVENOUS | Status: DC | PRN
  Administered 2012-04-09: 110 mg via INTRAVENOUS

## 2012-04-09 MED ORDER — ROCURONIUM BROMIDE 100 MG/10ML IV SOLN
INTRAVENOUS | Status: DC | PRN
  Administered 2012-04-09 (×2): 10 mg via INTRAVENOUS
  Administered 2012-04-09: 5 mg via INTRAVENOUS
  Administered 2012-04-09: 10 mg via INTRAVENOUS
  Administered 2012-04-09: 40 mg via INTRAVENOUS

## 2012-04-09 MED ORDER — MUPIROCIN 2 % EX OINT
TOPICAL_OINTMENT | Freq: Once | CUTANEOUS | Status: AC
  Administered 2012-04-09: 1 via NASAL
  Filled 2012-04-09: qty 22

## 2012-04-09 MED ORDER — SODIUM CHLORIDE 0.9 % IJ SOLN
INTRAMUSCULAR | Status: DC | PRN
  Administered 2012-04-09: 20 mL via INTRAVENOUS

## 2012-04-09 MED ORDER — ONDANSETRON HCL 4 MG/2ML IJ SOLN
INTRAMUSCULAR | Status: DC | PRN
  Administered 2012-04-09: 4 mg via INTRAVENOUS

## 2012-04-09 MED ORDER — KETOROLAC TROMETHAMINE 30 MG/ML IJ SOLN
30.0000 mg | Freq: Four times a day (QID) | INTRAMUSCULAR | Status: AC
  Administered 2012-04-09 – 2012-04-10 (×2): 30 mg via INTRAVENOUS
  Filled 2012-04-09 (×2): qty 1

## 2012-04-09 MED ORDER — LIDOCAINE HCL (CARDIAC) 20 MG/ML IV SOLN
INTRAVENOUS | Status: DC | PRN
  Administered 2012-04-09: 50 mg via INTRAVENOUS

## 2012-04-09 MED ORDER — HETASTARCH-ELECTROLYTES 6 % IV SOLN
INTRAVENOUS | Status: DC | PRN
  Administered 2012-04-09: 15:00:00 via INTRAVENOUS

## 2012-04-09 MED ORDER — LACTATED RINGERS IV SOLN
INTRAVENOUS | Status: DC | PRN
  Administered 2012-04-09: 13:00:00 via INTRAVENOUS

## 2012-04-09 MED ORDER — MIDAZOLAM HCL 5 MG/5ML IJ SOLN
INTRAMUSCULAR | Status: DC | PRN
  Administered 2012-04-09 (×2): 1 mg via INTRAVENOUS

## 2012-04-09 MED ORDER — STERILE WATER FOR IRRIGATION IR SOLN
Status: DC | PRN
  Administered 2012-04-09: 1500 mL

## 2012-04-09 MED ORDER — GLYCOPYRROLATE 0.2 MG/ML IJ SOLN
INTRAMUSCULAR | Status: DC | PRN
  Administered 2012-04-09: .4 mg via INTRAVENOUS

## 2012-04-09 MED ORDER — DIPHENHYDRAMINE HCL 50 MG/ML IJ SOLN: 12.5000 mg | Freq: Four times a day (QID) | INTRAMUSCULAR | Status: DC | PRN

## 2012-04-09 MED ORDER — KCL IN DEXTROSE-NACL 20-5-0.45 MEQ/L-%-% IV SOLN
INTRAVENOUS | Status: DC
  Administered 2012-04-09: 19:00:00 via INTRAVENOUS
  Administered 2012-04-10: 125 mL/h via INTRAVENOUS
  Administered 2012-04-10: 17:00:00 via INTRAVENOUS
  Administered 2012-04-11 (×2): 125 mL/h via INTRAVENOUS
  Filled 2012-04-09 (×6): qty 1000

## 2012-04-09 MED ORDER — FENTANYL CITRATE 0.05 MG/ML IJ SOLN
INTRAMUSCULAR | Status: DC | PRN
  Administered 2012-04-09 (×4): 50 ug via INTRAVENOUS

## 2012-04-09 MED ORDER — HYDROMORPHONE 0.3 MG/ML IV SOLN
INTRAVENOUS | Status: AC
  Filled 2012-04-09: qty 25

## 2012-04-09 MED ORDER — HEPARIN SODIUM (PORCINE) 1000 UNIT/ML IJ SOLN
INTRAMUSCULAR | Status: DC | PRN
  Administered 2012-04-09: 1000 [IU] via INTRAVENOUS

## 2012-04-09 MED ORDER — SODIUM CHLORIDE 0.9 % IV SOLN
10.0000 mg | INTRAVENOUS | Status: DC | PRN
  Administered 2012-04-09: 40 ug/min via INTRAVENOUS

## 2012-04-09 MED ORDER — ALBUMIN HUMAN 25 % IV SOLN
12.5000 g | Freq: Once | INTRAVENOUS | Status: DC
  Filled 2012-04-09: qty 50

## 2012-04-09 MED ORDER — KETOROLAC TROMETHAMINE 30 MG/ML IJ SOLN: 30.0000 mg | Freq: Four times a day (QID) | INTRAMUSCULAR | Status: AC

## 2012-04-09 MED ORDER — PHENYLEPHRINE HCL 10 MG/ML IJ SOLN
INTRAMUSCULAR | Status: DC | PRN
  Administered 2012-04-09: 40 ug via INTRAVENOUS
  Administered 2012-04-09: 80 ug via INTRAVENOUS
  Administered 2012-04-09: 40 ug via INTRAVENOUS

## 2012-04-09 MED ORDER — CHLORHEXIDINE GLUCONATE 0.12 % MT SOLN
15.0000 mL | Freq: Two times a day (BID) | OROMUCOSAL | Status: DC
  Administered 2012-04-10 – 2012-04-12 (×6): 15 mL via OROMUCOSAL
  Filled 2012-04-09: qty 195
  Filled 2012-04-09 (×8): qty 15

## 2012-04-09 MED ORDER — NALOXONE HCL 0.4 MG/ML IJ SOLN: 0.4000 mg | INTRAMUSCULAR | Status: DC | PRN

## 2012-04-09 MED ORDER — ALBUMIN HUMAN 25 % IV SOLN
INTRAVENOUS | Status: DC | PRN
  Administered 2012-04-09: 15:00:00 via INTRAVENOUS

## 2012-04-09 MED ORDER — BIOTENE DRY MOUTH MT LIQD
15.0000 mL | Freq: Two times a day (BID) | OROMUCOSAL | Status: DC
  Administered 2012-04-10 – 2012-04-12 (×6): 15 mL via OROMUCOSAL

## 2012-04-09 MED ORDER — ONDANSETRON HCL 4 MG PO TABS: 4.0000 mg | ORAL_TABLET | Freq: Four times a day (QID) | ORAL | Status: DC | PRN

## 2012-04-09 MED ORDER — INDIGOTINDISULFONATE SODIUM 8 MG/ML IJ SOLN
INTRAMUSCULAR | Status: DC | PRN
  Administered 2012-04-09: 5 mL via INTRAVENOUS

## 2012-04-09 MED ORDER — ZOLPIDEM TARTRATE 5 MG PO TABS: 5.0000 mg | ORAL_TABLET | Freq: Every evening | ORAL | Status: DC | PRN

## 2012-04-09 MED ORDER — DIPHENHYDRAMINE HCL 12.5 MG/5ML PO ELIX
12.5000 mg | ORAL_SOLUTION | Freq: Four times a day (QID) | ORAL | Status: DC | PRN
  Filled 2012-04-09: qty 5

## 2012-04-09 MED ORDER — PROMETHAZINE HCL 25 MG/ML IJ SOLN: 6.2500 mg | INTRAMUSCULAR | Status: DC | PRN

## 2012-04-09 MED ORDER — HYDROMORPHONE HCL PF 1 MG/ML IJ SOLN
0.2500 mg | INTRAMUSCULAR | Status: DC | PRN
  Administered 2012-04-09: 0.5 mg via INTRAVENOUS
  Administered 2012-04-09 (×2): 0.25 mg via INTRAVENOUS

## 2012-04-09 MED ORDER — SODIUM CHLORIDE 0.9 % IV SOLN
1.0000 g | INTRAVENOUS | Status: DC | PRN
  Administered 2012-04-09: 1 g via INTRAVENOUS

## 2012-04-09 MED ORDER — SODIUM CHLORIDE 0.9 % IJ SOLN: 9.0000 mL | INTRAMUSCULAR | Status: DC | PRN

## 2012-04-09 MED ORDER — HYDROMORPHONE 0.3 MG/ML IV SOLN
INTRAVENOUS | Status: DC
  Administered 2012-04-09: 17:00:00 via INTRAVENOUS

## 2012-04-09 MED ORDER — ONDANSETRON HCL 4 MG/2ML IJ SOLN: 4.0000 mg | Freq: Four times a day (QID) | INTRAMUSCULAR | Status: DC | PRN

## 2012-04-09 MED ORDER — 0.9 % SODIUM CHLORIDE (POUR BTL) OPTIME
TOPICAL | Status: DC | PRN
  Administered 2012-04-09: 2000 mL

## 2012-04-09 MED ORDER — NEOSTIGMINE METHYLSULFATE 1 MG/ML IJ SOLN
INTRAMUSCULAR | Status: DC | PRN
  Administered 2012-04-09: 3 mg via INTRAVENOUS

## 2012-04-09 MED ORDER — BUPIVACAINE LIPOSOME 1.3 % IJ SUSP
20.0000 mL | Freq: Once | INTRAMUSCULAR | Status: AC
  Administered 2012-04-09: 20 mL
  Filled 2012-04-09: qty 20

## 2012-04-09 SURGICAL SUPPLY — 42 items
ATTRACTOMAT 16X20 MAGNETIC DRP (DRAPES) ×2 IMPLANT
BAG URINE DRAINAGE (UROLOGICAL SUPPLIES) IMPLANT
CANISTER SUCTION 2500CC (MISCELLANEOUS) ×2 IMPLANT
CLIP TI MEDIUM LARGE 6 (CLIP) IMPLANT
CLOTH BEACON ORANGE TIMEOUT ST (SAFETY) ×2 IMPLANT
CONT SPECI 4OZ STER CLIK (MISCELLANEOUS) IMPLANT
COVER SURGICAL LIGHT HANDLE (MISCELLANEOUS) ×2 IMPLANT
DRAPE UTILITY 15X26 (DRAPE) IMPLANT
DRAPE WARM FLUID 44X44 (DRAPE) ×2 IMPLANT
DRSG TELFA PLUS 4X6 ADH ISLAND (GAUZE/BANDAGES/DRESSINGS) ×2 IMPLANT
ELECT BLADE 6.5 EXT (BLADE) ×2 IMPLANT
ELECT REM PT RETURN 9FT ADLT (ELECTROSURGICAL) ×2
ELECTRODE REM PT RTRN 9FT ADLT (ELECTROSURGICAL) ×1 IMPLANT
GAUZE SPONGE 4X4 16PLY XRAY LF (GAUZE/BANDAGES/DRESSINGS) ×2 IMPLANT
GLOVE BIOGEL M STRL SZ7.5 (GLOVE) ×4 IMPLANT
GLOVE SURG SS PI 6.5 STRL IVOR (GLOVE) ×4 IMPLANT
GOWN STRL REIN XL XLG (GOWN DISPOSABLE) ×2 IMPLANT
KIT BASIN OR (CUSTOM PROCEDURE TRAY) IMPLANT
LIGASURE IMPACT 36 18CM CVD LR (INSTRUMENTS) ×2 IMPLANT
PACK ABDOMINAL WL (CUSTOM PROCEDURE TRAY) ×2 IMPLANT
SHEET LAVH (DRAPES) ×2 IMPLANT
SPONGE LAP 18X18 X RAY DECT (DISPOSABLE) ×10 IMPLANT
STAPLER PROXIMATE 55 BLUE (STAPLE) ×2 IMPLANT
SUCTION POOLE TIP (SUCTIONS) ×2 IMPLANT
SUT ETHILON 1 LR 30 (SUTURE) IMPLANT
SUT MNCRL AB 4-0 PS2 18 (SUTURE) ×4 IMPLANT
SUT PDS AB 0 CT1 36 (SUTURE) ×4 IMPLANT
SUT PDS AB 0 CTX 60 (SUTURE) ×4 IMPLANT
SUT SILK 2 0 (SUTURE)
SUT SILK 2 0 30  PSL (SUTURE)
SUT SILK 2 0 30 PSL (SUTURE) IMPLANT
SUT SILK 2-0 18XBRD TIE 12 (SUTURE) IMPLANT
SUT SILK 3 0 SH CR/8 (SUTURE) ×4 IMPLANT
SUT VIC AB 0 CT1 36 (SUTURE) ×8 IMPLANT
SUT VIC AB 2-0 SH 27 (SUTURE) ×2
SUT VIC AB 2-0 SH 27X BRD (SUTURE) ×2 IMPLANT
SUT VIC AB 3-0 CTX 36 (SUTURE) IMPLANT
SUT VIC AB 4-0 PS1 27 (SUTURE) ×4 IMPLANT
SUT VICRYL 0 TIES 12 18 (SUTURE) ×2 IMPLANT
TOWEL OR NON WOVEN STRL DISP B (DISPOSABLE) IMPLANT
TRAY FOLEY CATH 14FRSI W/METER (CATHETERS) ×2 IMPLANT
WATER STERILE IRR 1500ML POUR (IV SOLUTION) ×2 IMPLANT

## 2012-04-09 NOTE — Interval H&P Note (Signed)
History and Physical Interval Note:  04/09/2012 1:22 PM  Shelley Sanchez  has presented today for surgery, with the diagnosis of PELVIC MASS  The various methods of treatment have been discussed with the patient and family. After consideration of risks, benefits and other options for treatment, the patient has consented to  Procedure(s) (LRB) with comments: EXPLORATORY LAPAROTOMY (N/A) - EXPLORATORY LAPAROTOMY, TOTAL ABDOMINAL HYESTERCTOMY, BILATERAL SALPINGO OOPHERECTOMY, POSSIBLE SURGICAL STAGING as a surgical intervention .  The patient's history has been reviewed, patient examined, no change in status, stable for surgery.  I have reviewed the patient's chart and labs.  Questions were answered to the patient's satisfaction.     [place], [facility]

## 2012-04-09 NOTE — Transfer of Care (Signed)
Immediate Anesthesia Transfer of Care Note  Patient: Shelley Sanchez  Procedure(s) Performed: Procedure(s) (LRB) with comments: EXPLORATORY LAPAROTOMY (N/A) - EXPLORATORY LAPAROTOMY, TOTAL ABDOMINAL HYESTERCTOMY, BILATERAL SALPINGO OOPHERECTOMY HYSTERECTOMY ABDOMINAL (N/A)  Patient Location: PACU  Anesthesia Type:General  Level of Consciousness: awake, alert , oriented and patient cooperative  Airway & Oxygen Therapy: Patient Spontanous Breathing and Patient connected to face mask oxygen  Post-op Assessment: Report given to PACU RN, Post -op Vital signs reviewed and stable and Patient moving all extremities  Post vital signs: Reviewed and stable  Complications: No apparent anesthesia complications

## 2012-04-09 NOTE — Op Note (Signed)
Preoperative Diagnosis: 1.  adnexal mass.  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 Exploratory laparotomy with total hysterectomy bilateral salpingo-oophorectomy, radical debulking, omentectomy   Surgeon: [name].  Nelly Rout, MD., PhD  Assistant Surgeon: Antionette Char, M.D. Assistant: Telford Nab RN  Specimens: Uterus Cervix, Uterus Cervix bilateral tubes / ovaries, cul-de-sac mass, diaphragmatic mass, upper abdominal peritoneal metastases omentum.   Estimated Blood Loss: 550 mL. Blood Replacement: None  Fluids: 3 L crystalloid 1 L Hespan, albumin  Urine Output: 100 cc  Complications: None.   Operative Findings: A 6-14 cm right ovarian mass, adherent to the pelvic sidewall encasing the right ureter and causing significant hydronephrosis, tumor was adherent to the posterior cul-de-sac the anterior rectal serosa, 1 cm lesions were noted in the upper abdomen on the peritoneum, there was a 2 cm diaphragmatic mass and an additional 0.5-1 cm the diaphragmatic masses, tumor nodules are visible in the anterior bladder serosa, no gross disease was visible on the omentum, at the completion of the surgery the only residual disease was a 7 mm lesion on the posterior surface of the right lobe of the liver . This represented an optimal cytoreduction  Frozen pathology was consistent with l high-grade serous ovarian adenocarcinoma;    Procedure:   The patient was seen in the Holding Room. The risks, benefits, complications, treatment options, and expected outcomes were discussed with the patient.  The patient concurred with the proposed plan, giving informed consent.   The patient was  identified as Shelley Sanchez   and the procedure verified as hysterectomy, BSO with possible staging. A Time Out was held and the above information confirmed upon entry to the operating room..  After induction of anesthesia, the patient was draped and prepped in  the usual sterile manner.  She was prepped and draped in the normal sterile fashion in the dorsal lithotomy position in padded Allen stirrups with good attention paid to support of the lower back and lower extremities. Position was adjusted for appropriate support. A Foley catheter was placed to gravity.   A midline vertical incision was made and carried through the subcutaneous tissue to the fascia. The fascial incision was made and extended superiorally. The rectus muscles were separated. The peritoneum was identified and entered. Peritoneal incision was extended longitudinally.  The abdominal cavity was entered sharply and without incident. 2.9 cc of ascites was removed. A Bookwalter retractor was then placed. A survey of the abdomen and pelvis revealed the above findings, which were notably significant for a large right sided retroperitoneal mass in closing the colon and adherent to the sidewall The left tube and ovary were grossly normal in appearance.  Washings were collected later discarded after it was clear that this was a stage IIIC cancer.   After packing the small bowel into the upper abdomen, we began the procedure by entering the right  pelvic sidewall just posterior to the right round ligament. The pararectal space was developed and the retroperitoneum developed up to the level of the common iliac artery.  The course of the proximal ureter was identified with ease. The right IP was then skeletonized, clamped, cut and suture ligated with 0 Vicryl x 2. The right utero-ovarian ligament was clamped and transected.   A portion of the right tube and ovary were then sent to pathology. The mass was sharply dissected off of the right pelvic sidewall. Urethrolysis was required from the area of transection of the right IP to the entry of  the ureter into the bladder. In doing so the bladder was dissected off the lower uterine cervix so that the entry of the ureter into the bladder was visualized. The left  round ligament was transected retroperitoneal space entered and the left ureter identified. Left ovarian vessels were identified isolated and transected with the use of the LigaSure. The dissection was continued inferiorly and the uterine vessels skeletonized bilaterally. The LigaSure was used to seal and transect the ureter and vessels bilaterally. Heaney clamps were used to transect the cervix from the cardinal vessels to the level of the vagina. Clamps were placed just inferior to the cervix and the specimen amputated.  The vaginal cuff was then closed in the standard [facility] fashion with 0 PDS suture.   The pelvis was copiously irrigated. Hemostasis was observed.   Attention was then placed with removal of disease that was on the anterior peritoneum,  some of this was removed with the bladder flap in the hysterectomy specimen, however the remaining nodules were sharply dissected off the anterior peritoneum. The posterior peritoneum was identified and the posterior peritoneum to me was performed. In the process the muscularis of the rectosigmoid was compromised. There is no gross entry into the lumen of the rectum. The area was oversewn with interrupted 3-0 silk sutures. A rigid proctoscopy was performed the pelvis filled with fluid and there was no evidence of free air within the pelvis.   An infragastric omentectomy was performed using a Ligasure.  Hemostasis was assured. The nodules on the  On the surface of the diaphragm were removed. The nodules on the peritoneum of the abdomen were also removed. The small bowel was run in its entirety and no metastatic disease is identified. The pelvis was copiously irrigated and drained and hemostasis was again assured  The fascia was reapproximated with 0 looped PDS using a total of two sutures. The subcutaneous layer was then irrigated copiously.  20 cc of Inspiral was injected into the subcutaneous tissue. The subcutaneous layer was then reapproximated with a  running 4-0 Monocryl. The patient tolerated the procedure well.   Sponge, lap and needle counts were correct x 2.    The patient had sequential compression devices for VTE prophylaxis and will receive Lovenox postoperatively in additiona. The patient was extubated and taken to the recovery room in stable condition.

## 2012-04-09 NOTE — Anesthesia Postprocedure Evaluation (Signed)
  Anesthesia Post-op Note  Patient: Shelley Sanchez  Procedure(s) Performed: Procedure(s) (LRB): EXPLORATORY LAPAROTOMY (N/A) HYSTERECTOMY ABDOMINAL (N/A)  Patient Location: PACU  Anesthesia Type: General  Level of Consciousness: awake and alert   Airway and Oxygen Therapy: Patient Spontanous Breathing  Post-op Pain: mild  Post-op Assessment: Post-op Vital signs reviewed, Patient's Cardiovascular Status Stable, Respiratory Function Stable, Patent Airway and No signs of Nausea or vomiting  Last Vitals:  Filed Vitals:   04/09/12 1835  BP: 100/53  Pulse: 108  Temp:   Resp: 15    Post-op Vital Signs: stable   Complications: No apparent anesthesia complications

## 2012-04-09 NOTE — Anesthesia Preprocedure Evaluation (Signed)
Anesthesia Evaluation  Patient identified by MRN, date of birth, ID band Patient awake    Reviewed: Allergy & Precautions, H&P , NPO status , Patient's Chart, lab work & pertinent test results  Airway Mallampati: II TM Distance: >3 FB Neck ROM: Full    Dental No notable dental hx.    Pulmonary neg pulmonary ROS, shortness of breath,  bil pleural effusions  breath sounds clear to auscultation  Pulmonary exam normal       Cardiovascular negative cardio ROS  Rhythm:Regular Rate:Normal     Neuro/Psych negative neurological ROS  negative psych ROS   GI/Hepatic negative GI ROS, Neg liver ROS,   Endo/Other  negative endocrine ROSHypothyroidism   Renal/GU negative Renal ROS  negative genitourinary   Musculoskeletal negative musculoskeletal ROS (+)   Abdominal   Peds negative pediatric ROS (+)  Hematology negative hematology ROS (+)   Anesthesia Other Findings   Reproductive/Obstetrics negative OB ROS                           Anesthesia Physical Anesthesia Plan  ASA: II  Anesthesia Plan: General   Post-op Pain Management:    Induction: Intravenous  Airway Management Planned: Oral ETT  Additional Equipment:   Intra-op Plan:   Post-operative Plan: Extubation in OR and Possible Post-op intubation/ventilation  Informed Consent: I have reviewed the patients History and Physical, chart, labs and discussed the procedure including the risks, benefits and alternatives for the proposed anesthesia with the patient or authorized representative who has indicated his/her understanding and acceptance.   Dental advisory given  Plan Discussed with: CRNA  Anesthesia Plan Comments:         Anesthesia Quick Evaluation

## 2012-04-09 NOTE — Preoperative (Signed)
Beta Blockers   Reason not to administer Beta Blockers:Not Applicable 

## 2012-04-09 NOTE — H&P (View-Only) (Signed)
Consult Note: Gyn-Onc  Consult was requested by Dr. Oti for the evaluation of Shelley Sanchez 56 y.o. female  CC:  Chief Complaint  Patient presents with  . Shortness of Breath  . Abdominal Pain  Pelvic mass.  HPI: 56 y.o. year old G3 P3 LNMP in early 50's.  In October 2013 Shelley Sanchez  noted fatigue, and fecal urge incontinence. Also noted increasing abdominal girth.  No change in appetite, no nausea or vomiting.  Reports  SOB onset in November 2013.    Denies uterine bleeding, no abdominal pain just discomfort. Was evaluated in urgent care care in December 2013 for malaise and referred to Dr. Wert (Pulmonology)  Thoracocentesis performed prior to Christmas and the cytology was negative for malignancy.  Reported labored breathing 03/26/2012 and return of pleural effusions was noted.  CT Chest/Abd/Pelvis 04/01/2012 notable for ascites so patient was admitted for evaluation.  Paracentesis performed 04/01/2012.  Results pending.  Patient reports discomfort chest heaviness  And cough when lying flat.  Reports 20 pound weight loss in the past year.  Was able to ride 50 miles, and swam regularly until September 2013.   CT Chest Abd/Pelvis: 04/01/2012 IMPRESSION:  Large bilateral pleural effusions with extensive passive atelectasis in both lungs.  Abdomen/Pelvis: 3.7 x 3.1 cm low attenuation lesion in segment 7 of the liver. Smaller subcentimeter low attenuation lesion is also noted in segment 7 of the liver (image 53 of series 2)  There is a large volume of ascites throughout the peritoneal cavity. In the pelvis there is a complex heterogeneous appearing soft tissue mass that appears to have multiple internal cystic components and some small amounts of calcium that measures at least 12.5 x 12.7 x 11.8 cm.   Allergy: No Known Allergies  Social Hx:   History   Social History  . Marital Status: Widowed    Spouse Name: N/A    Number of Children: 3  . Years of Education: N/A   Occupational History  .  Not on file.   Social History Main Topics  . Smoking status: Never Smoker   . Smokeless tobacco: Never Used  . Alcohol Use: No  . Drug Use: No  . Sexually Active: Yes   Other Topics Concern  . Not on file   Social History Narrative  . No narrative on file    Past Surgical Hx:  Past Surgical History  Procedure Date  . Breast enhancement surgery 2000  Mammogram 5 years ago No h/o colonoscopy Last Pap with dysplasia for the past few years. No f/u for colposcopy  Past Medical Hx:  Past Medical History  Diagnosis Date  . Skin cancer     Past Gynecological History: G3P3 Menarche 14 OCP x20 years   BTL 1992 LMP early 50's  Family Hx:  Family History  Problem Relation Age of Onset  . Melanoma Mother   . Skin cancer Brother   Mat aunt unknown gyn cancer  Review of Systems:  Constitutional  Feels well, fatigued/exhausted.  Minimal exertion causes fatigue Cardiovascular  No chest pain,  Pulmonary  Chest discomfort, unable to lie flat, cough with lying.  Reports clear phlehmn Gastro Intestinal  No nausea, vomitting, or diarrhoea. No bright red blood per rectum, no abdominal pain, reports mucous from the rectum. Genito Urinary  No frequency, urgency, dysuria, no vaginal bleeding or discharge Musculo Skeletal  No myalgia, arthralgia, joint swelling or pain  Neurologic  No weakness, numbness, change in gait,  Psychology  No depression, anxiety,   insomnia.   Vitals:  Blood pressure 109/61, pulse 108, temperature 98.4 F (36.9 C), temperature source Oral, resp. rate 18, height 5' 7" (1.702 m), weight 141 lb 1.5 oz (64 kg), SpO2 91.00%.  Physical Exam: WD in NAD, significant coughing with attempt to lie flat Neck  Supple NROM, without any enlargements.  Lymph Node Survey No cervical supraclavicular or inguinal adenopathy Cardiovascular  Pulse normal regularity and rhythm. S1 and S2 normal.  Lungs  Breath sounds absent 2/3 of left lung and 1/2 of right lung. Skin    No rash/lesions/breakdown  Psychiatry  Alert and oriented to person, place, and time  Abdomen  Normoactive bowel sounds, abdomen soft, non-tender No palpable omental cake.  Pelvic mass is not palpable. Back No CVA tenderness Genito Urinary  Vulva/vagina: Normal external female genitalia.  No lesions. No discharge or bleeding.  Bladder/urethra:  No lesions or masses  Vagina:no palpable masses  Cervix: Small smooth to palpation.   Uterus: Small, mobile, no parametrial involvement or nodularity.  Adnexa: Smooth 12 cm pelvic mass, no nodularity Rectal  Good tone, no masses no cul de sac nodularity. Smooth pelvic mass palpable. Extremities  No bilateral cyanosis, clubbing or edema.   Assessment/Plan:  Ms. Shelley Sanchez  is a 56 y.o.  year old with bilateral large pulmonary effusions large volume ascites and pelvic mass and a CA 125 2418.2.  There is a 3.7 cm lesion within the liver that may represent metastatic disease.  Patient with long standing history of cervical dysplasia without colposcopy.  Overall presentation concerning for a metastatic gyn malignancy.  Rec biopsy of the liver and CEA.  If liver biopsy or paracentesis c/w gyn primary would proceed with dose dense taxol/carboplatin for three cycles followed by interval debulking.  Patient would be well served with immediate chemotherapy given the recurrent pulmonary effusions.    If liver bx and paracentesis are negative Shelley Sanchez  will need immediate cervical colposcopy and exp lap with BSO and other indicated procedures.   Gyn Onc service will follow.  Plan d/w Shelley Sanchez.   All of her questions were answered to her satisfaction.      Shelley Dismuke, MD, Shelley Sanchez 04/02/2012, 2:33 PM   

## 2012-04-10 ENCOUNTER — Encounter (HOSPITAL_COMMUNITY): Payer: Self-pay | Admitting: Gynecologic Oncology

## 2012-04-10 LAB — CBC
MCV: 88.4 fL (ref 78.0–100.0)
Platelets: 273 10*3/uL (ref 150–400)
RBC: 2.49 MIL/uL — ABNORMAL LOW (ref 3.87–5.11)
RDW: 14.1 % (ref 11.5–15.5)
WBC: 7.8 10*3/uL (ref 4.0–10.5)

## 2012-04-10 LAB — BASIC METABOLIC PANEL
Calcium: 7.4 mg/dL — ABNORMAL LOW (ref 8.4–10.5)
Creatinine, Ser: 0.7 mg/dL (ref 0.50–1.10)
GFR calc Af Amer: >90 mL/min (ref >90)

## 2012-04-10 LAB — PREPARE RBC (CROSSMATCH)

## 2012-04-10 MED ORDER — LACTATED RINGERS IV BOLUS (SEPSIS)
500.0000 mL | Freq: Once | INTRAVENOUS | Status: AC
  Administered 2012-04-10: 500 mL via INTRAVENOUS

## 2012-04-10 MED ORDER — HYDROMORPHONE HCL PF 1 MG/ML IJ SOLN
0.5000 mg | INTRAMUSCULAR | Status: DC | PRN
  Administered 2012-04-10: 1 mg via INTRAVENOUS
  Administered 2012-04-10 (×2): 0.5 mg via INTRAVENOUS
  Administered 2012-04-11 (×2): 1 mg via INTRAVENOUS
  Filled 2012-04-10 (×5): qty 1

## 2012-04-10 NOTE — Progress Notes (Signed)
Encouraged patient that she needed to increase her activity with a goal of getting in the chair before the end of my shift.  Patient agreed. At this time, patient has dangled on the edge of the bed but wanted to wait another hour before trying to get to the chair. Will continue to monitor and encourage increase in activity.

## 2012-04-10 NOTE — Progress Notes (Signed)
Patient has had very little urine output. From 0700-1100, patient has only had 90cc of urine. Patient's BP has also remained low. MD made aware of these findings and will continue to monitor at this time.

## 2012-04-10 NOTE — Progress Notes (Signed)
Patient Dilaudid PCA was d/c. Removed her nasal cannula to see how she would do on RA. Patient's oxygen saturation was between 86-89% and respirations would drop as well. Patient stated "sometimes I hold my breath. I'm not sure why I do that." Encouraged patient to take slow deep breaths as well as applied 2L Dendron and oxygen saturation improved >90%. Will continue to monitor.

## 2012-04-10 NOTE — Progress Notes (Signed)
1 Day Post-Op Procedure(s) (LRB): EXPLORATORY LAPAROTOMY (N/A) HYSTERECTOMY ABDOMINAL (N/A)  Subjective: Patient reports "I am just recovering."  Denies pain at this time.  Has not been out of bed since surgery.  Feels weak.  Denies nausea, vomiting, passing flatus, chest pain, or dyspnea.  Objective: Vital signs in last 24 hours: Temp:  [97.5 F (36.4 C)-98.2 F (36.8 C)] 98 F (36.7 C) (01/15 0800) Pulse Rate:  [95-117] 109  (01/15 0400) Resp:  [13-23] 19  (01/15 0400) BP: (76-103)/(35-58) 84/52 mmHg (01/15 0614) SpO2:  [89 %-96 %] 94 % (01/15 0400) Weight:  [140 lb 4 oz (63.617 kg)-143 lb 4.8 oz (65 kg)] 143 lb 4.8 oz (65 kg) (01/15 0400) Last BM Date:  (PTA)  Intake/Output from previous day: 01/14 0701 - 01/15 0700 In: 5386 [I.V.:3786; IV Piggyback:1600] Out: 1028 [Urine:478; Blood:550]  Physical Examination: General: alert, cooperative and no distress Resp: diminished in the bases, more with the RLL Cardio: Tachycardic at 106 bpm, regular rhythm, no murmurs, clicks, or rubs GI: abnormal findings:  hypoactive bowel sounds and abd soft, non-distended and incision: midline incision with subcutaneous closure clean, dry, and intact Extremities: extremities normal, atraumatic, no cyanosis or edema  Labs: WBC/Hgb/Hct/Plts:  7.8/7.2/22.0/273 (01/15 0330) BUN/Cr/glu/ALT/AST/amyl/lip:  13/0.70/--/--/--/--/-- (01/15 0330)  Assessment: 57 y.o. s/p Procedure(s): EXPLORATORY LAPAROTOMY HYSTERECTOMY ABDOMINAL: stable Pain:  Pain is well-controlled on PCA.  Heme:  Hgb 7.2 and Hct 22.1 this am.    CV: BP low at times.  Tachycardic at times: plan to monitor  GI:  Tolerating po: No: NPO     GU:  Urine output 478cc from 04/09/12 to 04/10/12.  Foley still in place.  Prophylaxis: intermittent pneumatic compression boots.  Plan: Transfuse 2 units PRBCs today- Risks and benefits discussed with patient by Dr. Larey Brick of  clear liquids Hold on initiating lovenox at this  time.  Re-evaluate in the am CBC and Bmet in the am Encourage ambulation Encourage IS use, deep breathing, and coughing Continue stepdown status at this time Maintain foley catheter for close output monitoring due to decreased urinary output Continue post-operative plan of care Discontinue PCA and Dilaudid IV PRN ordered   LOS: 1 day    CROSS, MELISSA DEAL 04/10/2012, 8:54 AM

## 2012-04-10 NOTE — Progress Notes (Signed)
On call Md notified of low UOP and BP. Orders received for 500cc LR bolus.

## 2012-04-10 NOTE — Progress Notes (Signed)
Patient laying in bed with an ice pack on her head.  Complaining of a headache but recently given Dilaudid IV.  2 units of PRBCs completed.  Instructed that she needs to ambulate and be out of bed at least four times a day.  She reports that she will get out of bed as soon as her pain medicine "kicks in."  BP currently 99/60.  Urine output increasing.  Continue foley catheter at this time for monitoring.  No complaints voiced.  Instructed to inform the staff of any needs.  Plan to continue current post-op plan of care.

## 2012-04-10 NOTE — Progress Notes (Signed)
[id number]/Rhonda Davis, RN, BSN, CCM:  CHART REVIEWED AND UPDATED.  Next chart review due on [id number]. NO DISCHARGE NEEDS PRESENT AT THIS TIME. CASE MANAGEMENT [phone] 

## 2012-04-10 NOTE — Progress Notes (Signed)
MD notified of continued low UOP and BP after first fluid bolus. Order received for 500cc LR bolus again and then to monitor urine output beyond that point.  MD said urine would pick up. Will continue to monitor patient.

## 2012-04-11 LAB — TYPE AND SCREEN: Unit division: 0

## 2012-04-11 LAB — BASIC METABOLIC PANEL
CO2: 29 mEq/L (ref 19–32)
Calcium: 7.9 mg/dL — ABNORMAL LOW (ref 8.4–10.5)
GFR calc non Af Amer: >90 mL/min (ref >90)
Potassium: 4.1 mEq/L (ref 3.5–5.1)
Sodium: 133 mEq/L — ABNORMAL LOW (ref 135–145)

## 2012-04-11 LAB — CBC
MCH: 29.5 pg (ref 26.0–34.0)
Platelets: 277 10*3/uL (ref 150–400)
RBC: 3.29 MIL/uL — ABNORMAL LOW (ref 3.87–5.11)

## 2012-04-11 MED ORDER — ENOXAPARIN SODIUM 40 MG/0.4ML ~~LOC~~ SOLN
40.0000 mg | Freq: Every day | SUBCUTANEOUS | Status: DC
  Administered 2012-04-11 – 2012-04-12 (×2): 40 mg via SUBCUTANEOUS
  Filled 2012-04-11 (×3): qty 0.4

## 2012-04-11 NOTE — Progress Notes (Signed)
2 Days Post-Op Procedure(s) (LRB): EXPLORATORY LAPAROTOMY (N/A) HYSTERECTOMY ABDOMINAL (N/A)  Subjective: Patient reports tolerating sips of clears.  Only sat on the side of the bed last evening and refused to ambulate due to weakness.  Reporting adequate pain relief with dilaudid IV injection PRN for pain.  Denies chest pain, dyspnea, cough, nausea, vomiting, passing flatus, or having a bowel movement.    Objective: Vital signs in last 24 hours: Temp:  [97.7 F (36.5 C)-99.2 F (37.3 C)] 97.8 F (36.6 C) (01/16 0734) Pulse Rate:  [82-110] 93  (01/16 0734) Resp:  [9-25] 14  (01/16 0734) BP: (86-111)/(49-70) 111/67 mmHg (01/16 0734) SpO2:  [88 %-98 %] 95 % (01/16 0734) Last BM Date:  (PTA)  Intake/Output from previous day: 01/15 0701 - 01/16 0700 In: 3550 [I.V.:2875; Blood:675] Out: 1715 [Urine:1715]  Physical Examination: General: alert, cooperative and no distress Resp: diminished breath sounds in bilateral bases, no crackles or rhonchi noted Cardio: regular rate and rhythm, S1, S2 normal, no murmur, click, rub or gallop GI: incision: midline incision clean, dry, and intact and abdomen firm, bowel sounds present Extremities: extremities normal, atraumatic, no cyanosis or edema Left hand edematous with suspected infiltration of the left lateral IV site  Labs: WBC/Hgb/Hct/Plts:  8.1/9.7/28.7/277 (01/16 3244) BUN/Cr/glu/ALT/AST/amyl/lip:  6/0.70/--/--/--/--/-- (01/16 0102)  Assessment: 57 y.o. s/p Procedure(s): EXPLORATORY LAPAROTOMY HYSTERECTOMY ABDOMINAL: stable Pain:  Pain is well-controlled on PCA.  Heme:  Hgb 9.7 and Hct 28.7 post transfusion of 2 units PRBCs on 04/10/12.  CV:  BP stable at this time with last BP of 111/67.  Tachycardia improving with pulse in the 90s.      GI:  Tolerating sips of clears liquids.     GU:  Urine output improved with 1715 cc out from 04/10/12 to 04/11/12  Prophylaxis: intermittent pneumatic compression boots.  Plan: Begin Lovenox  40 mg SQ daily for DVT prop. Transfer to [address] Discontinue foley Advance to full liquid diet Discontinue IV and advance to PO pain medication Encourage ambulation, IS use, deep breathing, and coughing Continue post-operative plan of care   LOS: 2 days    CROSS, MELISSA DEAL 04/11/2012, 8:31 AM

## 2012-04-11 NOTE — Progress Notes (Signed)
Encouraged pt to turn/offered to reposition. Pt states "not right now, I am comfortable how I am." Will continue to promote ambulation. Shelley Sanchez

## 2012-04-11 NOTE — Progress Notes (Signed)
INITIAL NUTRITION ASSESSMENT  DOCUMENTATION CODES Per approved criteria  -Not Applicable   INTERVENTION: 1.  General healthful diet; encourage intake as able.  Supplements to be considered as adequacy is assessed.  Pt has continued to refuse supplements for now.  NUTRITION DIAGNOSIS: Inadequate oral intake related to unsupplemented liquid diet as evidenced by pt consuming 50% full liquid diet.   Monitor:  1.  Food/Beverage; pt consuming >50% of meals to meet >/=90% of estimated needs. 2.  Wt/wt change; monitor trends  Reason for Assessment: MST  57 y.o. female  Admitting Dx: shortness of breath  ASSESSMENT: Pt admitted s/p ex lap with total hysterectomy, bilateral salpingo oophorectomy, radical debulking, and omentectomy due to Stage III ovarian cancer. Pt recently assessed by RD during admission for bilateral pleural effusions.  At that time, pt reported good appetite with decreased intake r/t abdominal pain with ~5 lbs wt loss in 3 months. She also reported improvement in intake with respiratory status.   RD met with pt who states that her intake remained fair since d/c last week.  She has not had much liquid since surgery (1/14).  Her diet has been advanced to full liquids, but pt states she has no appetite.  RD discussed nutrition r/t healing.  Pt is hopeful that with mass removed, she will have improvement in appetite once "recovery underway."  She acknowledges her intake has been poor. RD stated availability of supplements as well as clinical nutrition staff to assist with intake as needed.  Will follow.  Height: Ht Readings from Last 1 Encounters:  04/09/12 5\' 7"  (1.702 m)    Weight: Wt Readings from Last 1 Encounters:  04/10/12 143 lb 4.8 oz (65 kg)    Ideal Body Weight: 135 lbs  % Ideal Body Weight: 104%  Wt Readings from Last 10 Encounters:  04/10/12 143 lb 4.8 oz (65 kg)  04/10/12 143 lb 4.8 oz (65 kg)  04/06/12 130 lb 15.3 oz (59.4 kg)  03/28/12 146 lb 6.4 oz  (66.407 kg)  03/18/12 145 lb (65.772 kg)  03/07/12 148 lb (67.132 kg)  03/06/12 143 lb 9.6 oz (65.137 kg)  03/04/12 144 lb (65.318 kg)    Usual Body Weight: 145 lbs  % Usual Body Weight: 97%  BMI:  Body mass index is 22.44 kg/(m^2).  Estimated Nutritional Needs: Kcal: 1800-2050 Protein: 62-75g Fluid: ~2.0 L/day  Skin: intact, LUE edema  Diet Order: Full Liquid  EDUCATION NEEDS: -Education not appropriate at this time   Intake/Output Summary (Last 24 hours) at 04/11/12 1135 Last data filed at 04/11/12 0900  Gross per 24 hour  Intake 3037.5 ml  Output   1780 ml  Net 1257.5 ml    Last BM: PTA  Labs:   Lab 04/11/12 0339 04/10/12 0330 04/05/12 0540  NA 133* 132* 131*  K 4.1 4.7 3.9  CL 101 102 94*  CO2 29 26 28   BUN 6 13 16   CREATININE 0.70 0.70 0.75  CALCIUM 7.9* 7.4* 8.4  MG -- -- --  PHOS -- -- --  GLUCOSE 122* 116* 104*    CBG (last 3)  No results found for this basename: GLUCAP:3 in the last 72 hours  Scheduled Meds:    . antiseptic oral rinse  15 mL Mouth Rinse q12n4p  . chlorhexidine  15 mL Mouth Rinse BID  . enoxaparin (LOVENOX) injection  40 mg Subcutaneous Daily    Continuous Infusions:   Past Medical History  Diagnosis Date  . Skin cancer   .  Shortness of breath     bilateral pleural effusions - s/p thoracentesis  . Ascites     s/p paracentesis  . Pelvic mass     Past Surgical History  Procedure Date  . Breast enhancement surgery 2000  . Laparotomy 04/09/2012    Procedure: EXPLORATORY LAPAROTOMY;  Surgeon: Laurette Schimke, MD PHD;  Location: [facility];  Service: Gynecology;  Laterality: N/A;  EXPLORATORY LAPAROTOMY, TOTAL ABDOMINAL HYESTERCTOMY, BILATERAL SALPINGO OOPHERECTOMY  . Abdominal hysterectomy 04/09/2012    Procedure: HYSTERECTOMY ABDOMINAL;  Surgeon: Laurette Schimke, MD PHD;  Location: [facility];  Service: Gynecology;  Laterality: N/A;    Loyce Dys, MS RD LDN Clinical Inpatient Dietitian Pager: [phone] Weekend/After  hours pager: [phone]

## 2012-04-12 ENCOUNTER — Telehealth: Payer: Self-pay | Admitting: Oncology

## 2012-04-12 LAB — CBC
Hemoglobin: 9.9 g/dL — ABNORMAL LOW (ref 12.0–15.0)
MCV: 86.8 fL (ref 78.0–100.0)
Platelets: 274 10*3/uL (ref 150–400)
RBC: 3.42 MIL/uL — ABNORMAL LOW (ref 3.87–5.11)
WBC: 7.1 10*3/uL (ref 4.0–10.5)

## 2012-04-12 LAB — BASIC METABOLIC PANEL
CO2: 29 mEq/L (ref 19–32)
Chloride: 100 mEq/L (ref 96–112)
Sodium: 134 mEq/L — ABNORMAL LOW (ref 135–145)

## 2012-04-12 NOTE — Progress Notes (Signed)
3 Days Post-Op Procedure(s) (LRB): EXPLORATORY LAPAROTOMY (N/A) HYSTERECTOMY ABDOMINAL (N/A)  Subjective: Patient reports no complaints.  Tolerating full liquid diet.  Passing flatus.  Adequate pain relief with PO Percocet.  Ambulating.  Denies chest pain, dyspnea, emesis, dizziness, headache, weakness, or having a bowel movement.   Objective: Vital signs in last 24 hours: Temp:  [97.6 F (36.4 C)-97.9 F (36.6 C)] 97.7 F (36.5 C) (01/17 1610) Pulse Rate:  [75-115] 75  (01/17 0632) Resp:  [16-19] 16  (01/17 0632) BP: (84-95)/(50-77) 84/50 mmHg (01/17 0632) SpO2:  [93 %-97 %] 97 % (01/17 9604) Last BM Date:  (PTA)  Intake/Output from previous day: 01/16 0701 - 01/17 0700 In: 125 [I.V.:125] Out: 675 [Urine:675]  Physical Examination: General: alert, cooperative and no distress Resp: diminished in the bases, more with the LLL.  No crackles or rhonchi noted. Cardio: regular rate and rhythm, S1, S2 normal, no murmur, click, rub or gallop and BP low at times.  Asymptomatic currently. GI: soft, non-tender; bowel sounds normal; no masses,  no organomegaly and incision: midline incision with SQ closure clean, dry, and intact Extremities: extremities normal, atraumatic, no cyanosis or edema  Labs: WBC/Hgb/Hct/Plts:  7.1/9.9/29.7/274 (01/17 5409) BUN/Cr/glu/ALT/AST/amyl/lip:  8/0.72/--/--/--/--/-- (01/17 8119)  Assessment: 57 y.o. s/p Procedure(s): EXPLORATORY LAPAROTOMY HYSTERECTOMY ABDOMINAL: stable Pain:  Pain is well-controlled on oral medications.  Heme:  Hgb 9.9 and Hct 29.7 this am: stable.  Post transfusion of 2 units PRBCs on 04/10/12.    CV:  BP low at times: asymptomatic at this time.  Tachycardia improved.  Continue to monitor.  GI:  Tolerating po: Yes     GU:  Urine output improving: 675 cc out from 04/11/12 to 04/12/12.    Prophylaxis: intermittent pneumatic compression boots and Lovenox 40 mg SQ daily  Plan: Advance diet to regular food Encourage ambulation,  IS use, deep breathing, and coughing Continue post-operative plan of care Plan for possible discharge in the am   LOS: 3 days    Kemontae Dunklee DEAL 04/12/2012, 9:21 AM

## 2012-04-12 NOTE — Care Management Note (Signed)
    Page 1 of 1   04/12/2012     11:22:31 AM   CARE MANAGEMENT NOTE 04/12/2012  Patient:  Shelley Sanchez, Shelley Sanchez   Account Number:  [id number]  Date Initiated:  04/10/2012  Documentation initiated by:  DAVIS,RHONDA  Subjective/Objective Assessment:   patient with extensive abd surg, low urine output and bp post op requiring volume     Action/Plan:   from home   Anticipated DC Date:  04/13/2012   Anticipated DC Plan:  HOME/SELF CARE         Choice offered to / List presented to:             Status of service:  Completed, signed off Medicare Important Message given?  NO (If response is "NO", the following Medicare IM given date fields will be blank) Date Medicare IM given:   Date Additional Medicare IM given:    Discharge Disposition:  HOME/SELF CARE  Per UR Regulation:  Reviewed for med. necessity/level of care/duration of stay  If discussed at Long Length of Stay Meetings, dates discussed:    Comments:  [id number] Earlene Plater, RN, BSN, CCM:  CHART REVIEWED AND UPDATED.  Next chart review due on [id number]. NO DISCHARGE NEEDS PRESENT AT THIS TIME. CASE MANAGEMENT [phone]

## 2012-04-12 NOTE — Telephone Encounter (Signed)
Pt scheduled to see Dr. Darrold Span 02/04 @ 9:45. Chemo/PFC 1/29 @ 10.  Welcome packet mailed.

## 2012-04-13 DIAGNOSIS — D62 Acute posthemorrhagic anemia: Secondary | ICD-10-CM | POA: Diagnosis not present

## 2012-04-13 MED ORDER — OXYCODONE-ACETAMINOPHEN 5-325 MG PO TABS: 2.0000 | ORAL_TABLET | Freq: Four times a day (QID) | ORAL | Status: DC | PRN

## 2012-04-13 NOTE — Progress Notes (Signed)
Pt stated that she has passed gas by burping but not by flatus.  Incision around abd area distended.  Will continue to monitor.

## 2012-04-13 NOTE — Discharge Summary (Signed)
Physician Discharge Summary  Patient ID: Shelley Sanchez MRN: [id number] DOB/AGE: 1955-04-29 57 y.o.  Admit date: 04/09/2012 Discharge date: 04/13/2012  Admission Diagnoses: Pelvic mass  Discharge Diagnoses:  Principal Problem:  *Pelvic mass Active Problems:  Postoperative anemia due to acute blood loss   Discharged Condition: good  Hospital Course: On 04/09/2012, the patient underwent the following: Procedure(s): EXPLORATORY LAPAROTOMY HYSTERECTOMY ABDOMINAL.   After the procedure, the patient was transferred to the stepdown unit to more closely monitor her volume/fluid status.  On POD# 1 the labs showed a significant anemia consistent with the intraoperative blood loss.  She was transfused 2 units PRBC.  There was an appropriate rise in the hemoglobin post transfusion.  She was subsequently transferred to the floor.  The remainder of the postoperative course was uneventful.  She was discharged to home on postoperative day 4 tolerating a regular diet.  Consults: None  Significant Diagnostic Studies: None  Treatments: surgery: see above  Discharge Exam: Blood pressure 92/57, pulse 91, temperature 98.1 F (36.7 C), temperature source Oral, resp. rate 14, height 5\' 7"  (1.702 m), weight 65 kg (143 lb 4.8 oz), SpO2 91.00%. General appearance: alert GI: soft, non-tender; bowel sounds normal; no masses,  no organomegaly Extremities: extremities normal, atraumatic, no cyanosis or edema Incision/Wound:  C/D/I  Disposition: 01-Home or Self Care      Discharge Orders    Future Appointments: Provider: Department: Dept Phone: Center:   04/15/2012 11:45 AM Nyoka Cowden, MD [facility] [phone] None   04/24/2012 10:00 AM [facility] [phone] None   04/24/2012 11:30 AM [facility] Financial Counselor [facility] [phone] None   04/30/2012 9:45 AM Dava Najjar [facility] [phone] None   04/30/2012 10:00 AM Lennis Buzzy Han, MD [facility] [phone] None     Future Orders Please Complete By Expires   Diet - low sodium heart healthy      Increase activity slowly      May walk up steps      May shower / Bathe      Comments:   No tub baths for 6 weeks   Driving Restrictions      Comments:   No driving for 2 weeks   Lifting restrictions      Comments:   No lifting > 5 lbs for 6 weeks   Sexual Activity Restrictions      Comments:   No intercourse for 6  weeks   Discharge wound care:      Comments:   Keep clean and dry   Call MD for:  temperature >100.4      Call MD for:  persistant nausea and vomiting      Call MD for:  severe uncontrolled pain      Call MD for:  redness, tenderness, or signs of infection (pain, swelling, redness, odor or green/yellow discharge around incision site)      Call MD for:  persistant dizziness or light-headedness      Call MD for:  extreme fatigue          Medication List     As of 04/13/2012  9:54 AM    STOP taking these medications         oxyCODONE 5 MG immediate release tablet   Commonly known as: Oxy IR/ROXICODONE      TAKE these medications  furosemide 40 MG tablet   Commonly known as: LASIX   Take 1 tablet (40 mg total) by mouth daily.      ibuprofen 200 MG tablet   Commonly known as: ADVIL,MOTRIN   Take 400 mg by mouth every 6 (six) hours as needed. For pain      levothyroxine 50 MCG tablet   Commonly known as: SYNTHROID, LEVOTHROID   Take 50 mcg by mouth every morning.      oxyCODONE-acetaminophen 5-325 MG per tablet   Commonly known as: PERCOCET/ROXICET   Take 2 tablets by mouth every 6 (six) hours as needed for pain.      potassium chloride 10 MEQ tablet   Commonly known as: K-DUR   Take 2 tablets (20 mEq total) by mouth 2 (two) times daily.        Follow-up Information    Please follow up. (Call [phone] to schedule  appointment)          Signed: Antionette Char A 04/13/2012, 9:54 AM

## 2012-04-14 ENCOUNTER — Other Ambulatory Visit: Payer: Self-pay | Admitting: Oncology

## 2012-04-14 DIAGNOSIS — C569 Malignant neoplasm of unspecified ovary: Secondary | ICD-10-CM

## 2012-04-14 NOTE — Progress Notes (Signed)
Pt had a discharge order.  She called the front desk and told secretary that her ride home was waiting downstairs and she needed to leave. As I I was going over pt d/c instructions, there were  Appointments made for her with the [facility].  She asked why did she have  Those appointments because it was her "understanding that she didn't have cancer."  I told her I didn't know, but I would call MD and ask if she wanted to wait for me to do so.  She replied that  She would call MD Monday am to see why those appointments were made.    I later checked pt's pathology report, and it did indeed say cancer was found.  Telford Nab came to see pt but she had already left.   I explained the situation to Morris, and she asked me to call pt and have her call her at the cancer center Monday am.  I called the pt and relayed the message to her.  She verbalized that she understood that she was to call Harriett Sine.  I also notified Dr. Tamela Oddi of the situation.  According to her both the pt and family had been told she had cancer.

## 2012-04-15 ENCOUNTER — Telehealth: Payer: Self-pay | Admitting: Oncology

## 2012-04-15 ENCOUNTER — Telehealth: Payer: Self-pay | Admitting: *Deleted

## 2012-04-15 ENCOUNTER — Ambulatory Visit: Payer: BC Managed Care – PPO | Admitting: Internal Medicine

## 2012-04-15 NOTE — Telephone Encounter (Signed)
C/D 04/15/12 for appt. 04/30/12

## 2012-04-15 NOTE — Telephone Encounter (Addendum)
Pt given appt 04/16/12 @ 1330, arrive @ 1300 to review path with Dr Nelly Rout. Other appts: chemo class 04/24/12 @ 1000 followed by financial counselor @ 1130. And appt 04/30/12 @ 1000 with Dr Darrold Span. All appts accepted.

## 2012-04-16 ENCOUNTER — Ambulatory Visit: Payer: BC Managed Care – PPO | Attending: Gynecologic Oncology | Admitting: Gynecologic Oncology

## 2012-04-16 ENCOUNTER — Encounter: Payer: Self-pay | Admitting: Gynecologic Oncology

## 2012-04-16 VITALS — BP 108/68 | HR 72 | Temp 98.3°F | Resp 14 | Ht 68.11 in | Wt 136.1 lb

## 2012-04-16 DIAGNOSIS — R971 Elevated cancer antigen 125 [CA 125]: Secondary | ICD-10-CM | POA: Insufficient documentation

## 2012-04-16 DIAGNOSIS — J90 Pleural effusion, not elsewhere classified: Secondary | ICD-10-CM | POA: Insufficient documentation

## 2012-04-16 DIAGNOSIS — R188 Other ascites: Secondary | ICD-10-CM | POA: Insufficient documentation

## 2012-04-16 DIAGNOSIS — C569 Malignant neoplasm of unspecified ovary: Secondary | ICD-10-CM | POA: Insufficient documentation

## 2012-04-16 NOTE — Patient Instructions (Signed)
Recommendations made for  Taxol carboplatin therapy. The options are dose dense versus on an every three-week schedule.  An appointment has been made for her to see Dr. Darrold Span in consultation. Followup with GYN on on May 16 2012 for postop check  Thank you very much Ms. Santrice Muzio for allowing me to provide care for you today.  I appreciate your confidence in choosing our Gynecologic Oncology team.  If you have any questions about your visit today please call our office and we will get back to you as soon as possible.  [name]. Dontrail Blackwell MD., PhD Gynecologic Oncology

## 2012-04-16 NOTE — Progress Notes (Signed)
Treatment Planning Note: Gyn-Onc  [name] 57 y.o. female  CC:  Chief Complaint  Patient presents with  . Ovarian Cancer    follow up    HPI: 57 y.o. year old G3 P3 LNMP in early 66's. In October 2013 Teal Gouveia noted fatigue, and fecal urge incontinence. Also noted increasing abdominal girth. No change in appetite, no nausea or vomiting. Reports SOB onset in November 2013. Denies uterine bleeding, no abdominal pain just discomfort. Was evaluated in urgent care care in December 2013 for malaise and referred to Dr. Sherene Sires (Pulmonology) Thoracocentesis performed prior to Christmas and the cytology was negative for malignancy. Reported labored breathing 03/26/2012 and return of pleural effusions was noted. CT Chest/Abd/Pelvis 04/01/2012 notable for ascites so patient was admitted for evaluation. Paracentesis performed 04/01/2012. Results were negative for malignancy.  Patient reports discomfort chest heaviness And cough when lying flat. Reports 20 pound weight loss in the past year. Was able to ride 50 miles, and swam regularly until September 2013.   CT Chest Abd/Pelvis: 04/01/2012  IMPRESSION:  Large bilateral pleural effusions with extensive passive atelectasis in both lungs.  Abdomen/Pelvis: 3.7 x 3.1 cm low attenuation lesion in segment 7 of the liver. Smaller subcentimeter low attenuation lesion is also noted in segment 7 of the liver (image 53 of series 2) There is a large volume of ascites throughout the peritoneal cavity. In the pelvis there is a complex heterogeneous appearing soft tissue mass that appears to have multiple internal cystic components and some small amounts of calcium that measures at least 12.5 x 12.7 x 11.8 cm.   Interval History:  Shelley Sanchez underwent exploratory laparotomy total abdominal hysterectomy bilateral salpingo-oophorectomy omentectomy radical debulking to optimal disease on 04/09/2012.   The only gross disease present was a 7 mm lesion in the posterior  most aspect of the liver.  The patient states that she's able to lie flat denies coughing and denies reaccumulation of large volume ascites.    Path: 1. 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 2. Adnexa - ovary +/- tube, neoplastic, right, and portion of right pelvic mass - HIGH GRADE SEROUS CARCINOMA. 3. Uterus and cervix, with left fallopian tube and left ovary - SEROSA: HIGH GRADE SEROUS CARCINOMA. - CERVIX: UNREMARKABLE. - ENDOMETRIUM: ATROPHIC - LEFT OVARY AND FALLOPIAN TUBE: BENIGN. NO TUMOR IDENTIFIED. 4. Cul-de-sac biopsy, posterior nodule - HIGH GRADE SEROUS CARCINOMA. 5. Peritoneum, biopsy, anterior pelvic - HIGH GRADE SEROUS CARCINOMA. 6. Peritoneum, biopsy, right peri colic gutter - HIGH GRADE SEROUS CARCINOMA 7. Omentum, resection for tumor - FINDINGS CONSISTENT WITH ADHESIONS. NO TUMOR IDENTIFIED. 8. Soft tissue, biopsy, diaphromatic nodule - HIGH GRADE SEROUS CARCINOMA.   Social Hx:   History   Social History  . Marital Status: Widowed    Spouse Name: N/A    Number of Children: 3  . Years of Education: N/A   Occupational History  . Not on file.   Social History Main Topics  . Smoking status: Never Smoker   . Smokeless tobacco: Never Used  . Alcohol Use: No  . Drug Use: No  . Sexually Active: Yes   Other Topics Concern  . Not on file   Social History Narrative  . No narrative on file    Past Surgical Hx:  Past Surgical History  Procedure Date  . Breast enhancement surgery 2000  . Laparotomy 04/09/2012    Procedure: EXPLORATORY LAPAROTOMY;  Surgeon: Laurette Schimke, MD PHD;  Location:  [facility];  Service: Gynecology;  Laterality: N/A;  EXPLORATORY LAPAROTOMY, TOTAL ABDOMINAL HYESTERCTOMY, BILATERAL SALPINGO OOPHERECTOMY  . Abdominal hysterectomy 04/09/2012    Procedure: HYSTERECTOMY ABDOMINAL;  Surgeon: Laurette Schimke, MD PHD;  Location: [facility];  Service: Gynecology;  Laterality: N/A;      Past Medical Hx:  Past Medical History  Diagnosis Date  . Skin cancer   . Shortness of breath     bilateral pleural effusions - s/p thoracentesis  . Ascites     s/p paracentesis  . Pelvic mass     Family Hx:  Family History  Problem Relation Age of Onset  . Melanoma Mother   . Skin cancer Brother     Vitals:  Blood pressure 108/68, pulse 72, temperature 98.3 F (36.8 C), temperature source Oral, resp. rate 14, height 5' 8.11" (1.73 m), weight 136 lb 1.6 oz (61.735 kg).  Physical Exam:  Thin female in no acute distress. Chest decreased breath sounds bilaterally in the lower one third of the lung bases.  Abdomen:  Abdomen soft, nontender and nondistended. Incision clean dry and intact.  Assessment/Plan:  57 year old with abdominal pelvic mass, ascites, and pleural effusions. CA 125 is elevated to about 2400 and CEA is normal.  She underwent optimal debulking on 04/09/2012. 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  Greater than 20 minutes was spent with the patient.  More than 18 minutes was spent discussing the prognosis. She is aware that there is a greater than 70% chance that the disease locally into remission. Patient and her significant other or are also aware that 70% of patients with stage III C disease will recur.  Recommendations made for the patient were for Taxol carboplatin therapy. The options are dose dense versus on an every three-week schedule.  An appointment has been made for her to see Dr. Darrold Span in consultation. Followup with GYN on on May 16 2012 for postop check  Laurette Schimke, MD 04/16/2012, 2:22 PM

## 2012-04-20 ENCOUNTER — Other Ambulatory Visit: Payer: Self-pay | Admitting: Oncology

## 2012-04-20 DIAGNOSIS — C569 Malignant neoplasm of unspecified ovary: Secondary | ICD-10-CM

## 2012-04-24 ENCOUNTER — Ambulatory Visit: Payer: BC Managed Care – PPO

## 2012-04-24 ENCOUNTER — Other Ambulatory Visit: Payer: BC Managed Care – PPO

## 2012-04-24 ENCOUNTER — Telehealth: Payer: Self-pay | Admitting: *Deleted

## 2012-04-24 NOTE — Telephone Encounter (Signed)
Patients friend called and canceled appts for today due to weather. Appts recheduled to tomorrow.  [name]

## 2012-04-25 ENCOUNTER — Other Ambulatory Visit: Payer: BC Managed Care – PPO

## 2012-04-25 ENCOUNTER — Ambulatory Visit: Payer: BC Managed Care – PPO

## 2012-04-30 ENCOUNTER — Other Ambulatory Visit (HOSPITAL_BASED_OUTPATIENT_CLINIC_OR_DEPARTMENT_OTHER): Payer: BC Managed Care – PPO | Admitting: Lab

## 2012-04-30 ENCOUNTER — Encounter: Payer: Self-pay | Admitting: Oncology

## 2012-04-30 ENCOUNTER — Telehealth: Payer: Self-pay | Admitting: *Deleted

## 2012-04-30 ENCOUNTER — Telehealth: Payer: Self-pay | Admitting: Oncology

## 2012-04-30 ENCOUNTER — Ambulatory Visit (HOSPITAL_BASED_OUTPATIENT_CLINIC_OR_DEPARTMENT_OTHER): Payer: BC Managed Care – PPO | Admitting: Oncology

## 2012-04-30 VITALS — BP 101/67 | HR 83 | Temp 96.8°F | Resp 18 | Ht 67.5 in | Wt 133.7 lb

## 2012-04-30 DIAGNOSIS — Z23 Encounter for immunization: Secondary | ICD-10-CM

## 2012-04-30 DIAGNOSIS — G609 Hereditary and idiopathic neuropathy, unspecified: Secondary | ICD-10-CM

## 2012-04-30 DIAGNOSIS — C569 Malignant neoplasm of unspecified ovary: Secondary | ICD-10-CM

## 2012-04-30 LAB — CBC WITH DIFFERENTIAL/PLATELET
EOS%: 7.5 % — ABNORMAL HIGH (ref 0.0–7.0)
Eosinophils Absolute: 0.4 10*3/uL (ref 0.0–0.5)
HCT: 33.4 % — ABNORMAL LOW (ref 34.8–46.6)
HGB: 11.3 g/dL — ABNORMAL LOW (ref 11.6–15.9)
LYMPH%: 25.3 % (ref 14.0–49.7)
MONO%: 9.4 % (ref 0.0–14.0)
RBC: 3.8 10*6/uL (ref 3.70–5.45)
RDW: 16.6 % — ABNORMAL HIGH (ref 11.2–14.5)
WBC: 5.4 10*3/uL (ref 3.9–10.3)

## 2012-04-30 LAB — COMPREHENSIVE METABOLIC PANEL (CC13)
ALT: 20 U/L (ref 0–55)
Alkaline Phosphatase: 103 U/L (ref 40–150)
CO2: 26 mEq/L (ref 22–29)
Creatinine: 0.7 mg/dL (ref 0.6–1.1)
Glucose: 77 mg/dl (ref 70–99)
Total Bilirubin: 0.3 mg/dL (ref 0.20–1.20)

## 2012-04-30 LAB — CA 125: CA 125: 485.6 U/mL — ABNORMAL HIGH (ref 0.0–30.2)

## 2012-04-30 MED ORDER — INFLUENZA VIRUS VACC SPLIT PF IM SUSP
0.5000 mL | Freq: Once | INTRAMUSCULAR | Status: AC
  Administered 2012-04-30: 0.5 mL via INTRAMUSCULAR
  Filled 2012-04-30: qty 0.5

## 2012-04-30 NOTE — Patient Instructions (Addendum)
First chemotherapy will be Friday Feb 7 at ~ 1215  The night before chemo, you should take decadron (dexamethasone, steroid) five of the 4 mg tablets (=20 mg) 12 hours before taxol with food, and again 6 hours before chemotherapy take five tablets (=20 mg) with food.  For the first chemo, this will be ~ midnight on Feb 6 and ~ 6 AM on Feb 7.  The night of chemo, whether or not any nausea, you should take ativan (lorazepam) at bedtime, which will also make you drowsy. The morning after chemo, whether or not any nausea, take zofran (ondansetron), which will not make you drowsy  We will send prescriptions to [facility] [address] for zofran (ondansetron), ativan (lorazepam) and decadron (dexamethasone).  Call at any time if questions or concerns [phone]

## 2012-04-30 NOTE — Telephone Encounter (Signed)
Per staff message and POF I have scheduled appts.  [name]  

## 2012-04-30 NOTE — Progress Notes (Signed)
[facility] NEW PATIENT EVALUATION   Name: Shelley Sanchez Date: 04/30/2012 MRN: [id number] DOB: 27-Oct-1955  REFERRING PHYSICIAN: Laurette Schimke CC: M.Wert, ([facility])   REASON FOR REFERRAL: IIIC high grade serous ovarian carcinoma, for chemotherapy    HISTORY OF PRESENT ILLNESS:Shelley Sanchez is a 57 y.o. female who is .seen in consultation at request of Dr Nelly Rout, for consideration of adjuvant chemotherapy for recently diagnosed ovarian cancer.  Patient had last gyn exam spring 2013 in [place]. She felt in usual excellent health thru Sept 2013,very athletic including 50 mile biking at [employer] in Sept, also swimming and hiking. In Oct 2013 she was more fatigued, with some fecal urge incontinence and abdominal fullness such that she began to diet to lose weight. By Fabienne Bruns she could not climb one flight of stairs and could walk only from office to car without SOB and coughing. She was seen at urgent care in Dec with bilateral pleural effusions on CXR, referred to Dr Francella Solian, with 1.6 liter right thoracentesis done 03-11-12 (no path in this EMR). She had CT CAP and CT angio chest with large bilateral pleural effusions, no PE,  ascites and pelvic mass (see below). She was admitted 1-6 thru 04-06-12, with US paracentesis for 1.4 liters on 04-02-12 and left thoracentesis for 2 liters on 04-05-12. CA 125 was 2418. She was seen in consultation in hospital by Dr Nelly Rout on 04-02-12, then readmitted for surgery by gyn oncology on 04-09-12. The procedure by Dr Nelly Rout was exploratory laparotomy with TAH/BSO, radical debulking and omentectomy, which was optimal debulking with only residual at completion of procedure being 7 mm area on posterior surface of right lobe liver. She was transfused 2 units PRBCs on 04-10-12 for Hgb down to 7.2 (from 12 preop). Post operative course otherwise was not remarkable and she was DC home on 04-13-12. Pathology [phone]) high grade serous carcinoma  involving right pelvic mass, serosa of uterus and peritoneum, omentum negative, no nodes submitted. Patient was seen back by Dr Nelly Rout on 04-16-12; I have discussed with Dr Nelly Rout prior to visit, with recommendation for dose dense taxol carboplatin. Patient had chemotherapy education prior to visit today.  REVIEW OF SYSTEMS:  Patient has felt much better since surgery, with less SOB and less abdominal swelling. She denies any pain now and is not using any pain medication, tho still some soreness lower chest bilaterally with deep inspiration only. She has not been on lovenox since discharge. She has had no fever and no symptoms of infection. Bowels are moving small amounts daily now, after some initial post op constipation and one episode of diarrhea. She is not using laxatives/ stool softeners. She has not resumed exercising and is not doing any heavy activity at home. Usual good weight 140 lbs. Appetite good now, trying to get high protein diet. No nausea or vomiting.  Occasional sinus HA. Good visual acuity with glasses. No hearing problems. Slightly hypothyroid on Dr Thurston Hole initial evaluation, on synthroid 50 mcg since Dec 2013.No known dental problems, with cleaning in [place] within past year. No productive cough or hemoptysis. No problems with bilateral breast implants. No bladder symptoms. No arthritis, no history of blood clots or unusual bleeding, just occasional hot flashes not bothersome. Minimal numbness fingertips right hand chronic and unchanged. Venous access has been easy.  ALLERGIES: NKDA  PAST MEDICAL HISTORY:   G3P3 Breast augmentation 2000 Mammograms ~ 5 years ago in [place] No colonoscopy Hx dysplasia on PAPs, no colposcopy   CURRENT MEDICATIONS:  reviewed as listed in EMR She did request and receive flu vaccine today. [facility] [address]  SOCIAL HISTORY:  reports that she has never smoked. She has never used smokeless tobacco. She reports that she does not  drink alcohol or use illicit drugs. Never transfused prior to recent surgery. 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 Widowed, husband committed suicide in 1996. Lives with youngest son, age 45, who is Consulting civil engineer at [employer]. Two other sons ages 19 and 34 in [place] and [place], no grandchildren. Patient completed undergraduate degree in psychology at [employer] recently.   FAMILY HISTORY: family history includes Melanoma in her mother and Skin cancer in her brother. Mother had hysterectomy for fibroids in late 55s. Maternal aunt with gyn cancer. Paternal grandmother with ovarian cancer. Sons healthy.   LABORATORY DATA: 04-30-2012  CBC with WBC 5.4, ANC 3.1, Hgb 11.3, MCV 88, plt 245k, RDW 16.6, eos 7.5% otherwise diff not remarkable   CA 125   485  CMET: normal with exception of albumin 3.2, including BUN 18 and creat 0.7.    PATHOLOGY  Accession #: [id number] OF SURGICAL PATHOLOAL DIAGNOSISDiagnosis 1. 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 2. Adnexa - ovary +/- tube, neoplastic, right, and portion of right pelvic mass - HIGH GRADE SEROUS CARCINOMA. 3. Uterus and cervix, with left fallopian tube and left ovary - SEROSA: HIGH GRADE SEROUS CARCINOMA. - CERVIX: UNREMARKABLE. - ENDOMETRIUM: ATROPHIC - LEFT OVARY AND FALLOPIAN TUBE: BENIGN. NO TUMOR IDENTIFIED. 4. Cul-de-sac biopsy, posterior nodule - HIGH GRADE SEROUS CARCINOMA. 5. Peritoneum, biopsy, anterior pelvic - HIGH GRADE SEROUS CARCINOMA. 6. Peritoneum, biopsy, right peri colic gutter - HIGH GRADE SEROUS CARCINOMA 7. Omentum, resection for tumor - FINDINGS CONSISTENT WITH ADHESIONS. NO TUMOR IDENTIFIED. 8. Soft tissue, biopsy, diaphromatic nodule - HIGH GRADE SEROUS CARCINOMA. Microscopic Comment 1. OVARY Specimen(s): Right ovary with  uterus, left ovary, peritoneal, omentum and diaphragmatic biopsies. Procedure(s): Resection with hysterectomy and peritoneal biopsies. Lymph node sampling performed: No. Primary tumor site: Right ovary. Ovarian surface involvement: Present. Maximum tumor size (cm): 15 cm Histologic type: Serous carcinoma. Grade: High grade. Peritoneal implants: present. 1 of 3 FINAL for Kramlich, Harlene [phone]) Microscopic Comment(continued) Pelvic extension: Uterine serosa, cul-de-sac and pelvic peritoneum. Peritoneal washings: N/A Lymph nodes: number examined - 0 ; number positive - N/A TNM code: pT3c, pNX FIGO Stage (based on pathologic findings, needs clinical correlation): IIIC Comments: The tumor in the right pelvic mass is a 15 cm high grade serous carcinoma with surface involvement and the tumor was received disrupted. There are also nodules of tumor involving the uterine serosa, the posterior cul-de-sac, the anterior pelvic peritoneum, pericolic gutter peritoneum as well as the diaphragm.     RADIOGRAPHY:   CT CHEST, ABDOMEN AND PELVIS WITHOUT CONTRAST 04-01-2012 Technique: Multidetector CT imaging of the chest, abdomen and  pelvis was performed following the standard protocol without IV  contrast.  Comparison: No priors.  CT CHEST   Mediastinum: Heart size is normal. There is no significant  pericardial fluid, thickening or pericardial calcification. No  pathologically enlarged mediastinal or hilar lymph nodes. Please  note that accurate exclusion of hilar adenopathy is limited on  noncontrast CT scans. Esophagus is unremarkable in appearance.  Lungs/Pleura: Large bilateral pleural effusions. On the  noncontrast images obtained, there is no definite pleural nodule or  mass, however, assessment is limited without IV contrast. These  effusions are associated with extensive passive atelectasis in the  dependent portions of the lungs, including near complete  atelectasis of both lower  lobes. There is also some atelectasis in  the right middle lobe.  Musculoskeletal: There are no aggressive appearing lytic or blastic  lesions noted in the visualized portions of the skeleton.  Bilateral breast implants are incidentally noted.  IMPRESSION:  1. Large bilateral pleural effusions with extensive passive  atelectasis in both lungs.  CT ABDOMEN AND PELVIS  Abdomen/Pelvis: 3.7 x 3.1 cm low attenuation lesion in segment 7 of  the liver. Smaller subcentimeter low attenuation lesion is also  noted in segment 7 of the liver (image 53 of series 2). There is  some high attenuation material layering dependently in the lumen of  the gallbladder, likely represent biliary sludge. The appearance  of the pancreas, spleen, bilateral adrenal glands and bilateral  kidneys is unremarkable on this noncontrast CT examination.  There is a large volume of ascites throughout the peritoneal  cavity. In the pelvis there is a complex heterogeneous appearing  soft tissue mass that appears to have multiple internal cystic  components and some small amounts of calcium. This lesion is  irregular in shape and therefore difficult to accurately measure,  however, it measures at least 12.5 x 12.7 x 11.8 cm. Although this  comes in close proximity to the patient's uterus, the lesion is  favored to be separate from the uterus, likely of ovarian origin.  No pneumoperitoneum. No pathologic distension of small bowel.  Musculoskeletal: There are no aggressive appearing lytic or blastic  lesions noted in the visualized portions of the skeleton.  IMPRESSION:  1. Large mass in the pelvis measuring approximately 12.5 x 12.7 x  1.8 cm, presumably a large ovarian malignancy. 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  2. There are two liver lesions in the right lobe of the liver, as  above, which are incompletely characterized on this noncontrast CT  examination.  3. Probable biliary  sludge layering dependently in the  gallbladder.     CT ANGIOGRAPHY CHEST 04-01-12 Technique: Multidetector CT imaging of the chest using the  standard protocol during bolus administration of intravenous  contrast. Multiplanar reconstructed images including MIPs were  obtained and reviewed to evaluate the vascular anatomy.  Contrast: OMNIPAQUE IOHEXOL 350 MG/ML SOLN  Comparison: CT abdomen 04/01/2012.  Findings: Large bilateral pleural effusions are present. Negative  for pulmonary embolus. Technically adequate study. Aorta and  branch vessels appear within normal limits. Relaxation atelectasis  affecting all lobes, most prominently involving the lower lobes  bilaterally. No definite pericardial effusion. No axillary  adenopathy. Trachea and mainstem bronchi appear patent.  Calcification is present and atelectatic left upper lobe adjacent  to the fissure, probably old granulomatous calcification. No  aggressive osseous lesions are present. Incidental imaging of the  upper abdomen demonstrates a cystic lesion in the right hepatic  lobe previously described on CT and probably representing a cyst.  Bilateral breast implants are noted.  IMPRESSION:  1. No pulmonary embolus. No acute vascular abnormality.  2. Large left greater than right bilateral pleural effusions with  Relaxation/compressive atelectasis.   PHYSICAL EXAM: Weight 133 lb  11 oz,  BP 101/67, HR 83 regular, respirations 18 and not labored RA, temp 96.8 Slender lady, looks stated age, good historian, very pleasant, respirations not labored RA, easily ambulatory and looks generally comfortable  HEENT: normal hair pattern. PERRL, not icteric. Oral mucosa and posterior pharynx moist and clear, good dentition. Neck supple without thyroid mass appreciated, no JVD. Lymphatics: no palpable adenopathy cervical, supraclavicular, axillary or inguinal Lungs with slightly decreased BS right base and minimal crackles left base, dull  to percussion right base only, otherwise clear. Heart RRR without gallop Breasts bilaterally with implants, not tight, no appreciable masses or skin/nipple findings. Nothing in either axilla Abdomen soft, some normal BS, minimally distended centrally. Midline incision completely closed, no erythema or tenderness. No appreciable HSM LE no edema, cords, tenderness Neurologic: slight decreased sensation light touch fingertips right hand only. CN, motor, sensory, cerebellar nonfocal. Skin with actinic changes face,neck, UE but no rash, ecchymoses, concerning lesions.  We have discussed circumstances surrounding diagnosis, the surgery that was done and pathology information, and recommendation for chemotherapy. We have discussed study from [place] with dose dense chemotherapy and contrasted this with q 3 week taxol carboplatin. We have discussed mechanism of action of the chemotherapy and likely side effects including nausea, hair loss, decrease in blood counts, aches and peripheral neuropathy. We have discussed antiemetics and premedication steroids for taxol (which she will start at full dose but may be able to decrease to 12 hr prior dose only if she has no problems with the taxol.Peripheral IV access seems adequate for present. She is aware that cytopenias are often a side effect of dose dense chemotherapy and may require growth factors, transfusions or dose adjustments. She knows that she can contact office at any time if she has questions or concerns. All questions were answered to her satisfaction.   She would like to begin as soon as possible, and we will be able to start treatment with day 1 cycle 1 dose dense taxol carbo on 05-03-12. I will see her again on 2-11, or sooner if needed.   IMPRESSION / PLAN:  1.IIIC high grade serous carcinoma of right ovary: post radical optimal debulking 04-09-12 and to begin chemotherapy with dose dense taxol/ carboplatin on 05-03-12. 2.slight preexisting peripheral  neuropathy fingertips right which we will follow on the taxol 3.post operative anemia, improved 4.overdue mammograms which we will arrange probably after this intensive treatment is completed. Post breast augmentations. 5.no colonoscopy, which she would also like done after chemotherapy 6. Flu vaccine given today  Renso Swett P, MD 04/30/2012 9:41 AM

## 2012-04-30 NOTE — Telephone Encounter (Signed)
gv and printed appt schedule for pt for Feb...emailed michelle to add tx. ..pt aware

## 2012-05-01 ENCOUNTER — Other Ambulatory Visit: Payer: Self-pay | Admitting: Oncology

## 2012-05-01 ENCOUNTER — Telehealth: Payer: Self-pay | Admitting: *Deleted

## 2012-05-01 ENCOUNTER — Other Ambulatory Visit: Payer: Self-pay | Admitting: *Deleted

## 2012-05-01 DIAGNOSIS — C569 Malignant neoplasm of unspecified ovary: Secondary | ICD-10-CM

## 2012-05-01 MED ORDER — ONDANSETRON HCL 8 MG PO TABS: ORAL_TABLET | ORAL | Status: DC

## 2012-05-01 MED ORDER — LORAZEPAM 0.5 MG PO TABS: ORAL_TABLET | ORAL | Status: DC

## 2012-05-01 MED ORDER — DEXAMETHASONE 4 MG PO TABS: ORAL_TABLET | ORAL | Status: DC

## 2012-05-01 NOTE — Telephone Encounter (Signed)
Call[facility] [address] Reviewed with patient how to take Decadron pre taxol and reviewed zofran and ativan

## 2012-05-02 ENCOUNTER — Encounter: Payer: Self-pay | Admitting: Internal Medicine

## 2012-05-02 ENCOUNTER — Ambulatory Visit (INDEPENDENT_AMBULATORY_CARE_PROVIDER_SITE_OTHER): Payer: BC Managed Care – PPO | Admitting: Internal Medicine

## 2012-05-02 ENCOUNTER — Ambulatory Visit (INDEPENDENT_AMBULATORY_CARE_PROVIDER_SITE_OTHER)
Admission: RE | Admit: 2012-05-02 | Discharge: 2012-05-02 | Disposition: A | Discharge status: Home / Self Care | Payer: BC Managed Care – PPO | Source: Ambulatory Visit | Attending: Internal Medicine | Admitting: Internal Medicine

## 2012-05-02 VITALS — BP 92/62 | HR 77 | Temp 97.8°F | Ht 67.0 in | Wt 137.4 lb

## 2012-05-02 DIAGNOSIS — E039 Hypothyroidism, unspecified: Secondary | ICD-10-CM

## 2012-05-02 DIAGNOSIS — J90 Pleural effusion, not elsewhere classified: Secondary | ICD-10-CM

## 2012-05-02 DIAGNOSIS — R188 Other ascites: Secondary | ICD-10-CM

## 2012-05-02 NOTE — Assessment & Plan Note (Signed)
-   ESR 15/ bnp 75 03/07/2012  - Thoracentesis  R 03/07/2012 > 1.6 liters of turbid,amber fluid/ exudative/monos> cytology neg - CT chest / abd 04/01/2012 c/w ovarian mass with ascites> pos dx ovarian ca IIIc 04/09/12 - L thoracentesis 04/05/12 > 2liters > neg cytology  cxr with min residual so no f/u needed

## 2012-05-02 NOTE — Assessment & Plan Note (Signed)
See 04/01/2012 CT abd  - Ca  124  2418 04/02/12 - Paracentesis 04/02/2012  X 2liters - Lap 1/44/14 >  IIIc Ovarian ca  To start chemo 1/7

## 2012-05-02 NOTE — Patient Instructions (Addendum)
Please remember to go to the  x-ray department downstairs for your tests - we will call you with the results when they are available.     

## 2012-05-02 NOTE — Progress Notes (Signed)
  Subjective:    Patient ID: Shelley Sanchez, female    DOB: Jan 26, 1956, 57 y.o.   MRN: [id number]  HPI 49 yowf never smoker with excellent baseline activity tolerance able to cross biking in Sept of 2013 referred to pulmonary clinic 03/07/2012 by Dr Alwyn Ren at [facility] for eval of cough and abn cxr with bilateral effusions   03/11/12  R thoracentesis > 1.6 liter exudate/ monos/ neg cyt/ tsh slt elevated > rx synthoid 50 mcg per day but  Recurred on synthroid with ascites and ultimately dosed by Lap with IIIc ovarian ca to start chemo 05/03/12  05/02/2012 f/u ov/Mindy Gali cc breathing fine, lots of energy, needs tsh rechecked.  No cough or swelling of legs  Sleeping ok without nocturnal  or early am exacerbation  of respiratory  c/o's or need for noct saba. Also denies any obvious fluctuation of symptoms with weather or environmental changes or other aggravating or alleviating factors except as outlined above   ROS  The following are not active complaints unless bolded sore throat, dysphagia, dental problems, itching, sneezing,  nasal congestion or excess/ purulent secretions, ear ache,   fever, chills, sweats, unintended wt loss, pleuritic or exertional cp, hemoptysis,  orthopnea pnd or leg swelling, presyncope, palpitations, heartburn, abdominal pain, anorexia, nausea, vomiting, diarrhea  or change in bowel or urinary habits, change in stools or urine, dysuria,hematuria,  rash, arthralgias, visual complaints, headache, numbness weakness or ataxia or problems with walking or coordination,  change in mood/affect or memory.           Objective:   Physical Exam  Wt Readings from Last 3 Encounters:  05/02/12 137 lb 6.4 oz (62.324 kg)  04/30/12 133 lb 11.2 oz (60.646 kg)  04/16/12 136 lb 1.6 oz (61.735 kg)     Gen. Pleasant, well-nourished, in no distress, normal affect ENT - no lesions, no post nasal drip Neck: No JVD, no thyromegaly, no carotid bruits Lungs: no use of accessory muscles, no dullness to  percussion, good air movement bilaterally Cardiovascular: Rhythm regular, heart sounds  normal, no murmurs or gallops, no peripheral edema Abdomen: soft and non-tender,protuberant, tympanic, no hepatosplenomegaly, BS normal. Musculoskeletal: No deformities, no cyanosis or clubbing Neuro:  alert, non focal   CXR  05/02/2012 :     The previously noted right pleural effusion has completely resolved. There is a minimal residual left pleural effusion.   Assessment & Plan:

## 2012-05-02 NOTE — Assessment & Plan Note (Signed)
Lab Results  Component Value Date   TSH 5.96* 03/07/2012     Clinically euthyroid on 50 mcg per day but needs f/u TSH, reviewed with pt

## 2012-05-03 ENCOUNTER — Ambulatory Visit (HOSPITAL_BASED_OUTPATIENT_CLINIC_OR_DEPARTMENT_OTHER): Payer: BC Managed Care – PPO

## 2012-05-03 VITALS — BP 98/54 | HR 52 | Temp 97.4°F | Resp 16

## 2012-05-03 DIAGNOSIS — Z5111 Encounter for antineoplastic chemotherapy: Secondary | ICD-10-CM

## 2012-05-03 DIAGNOSIS — C569 Malignant neoplasm of unspecified ovary: Secondary | ICD-10-CM

## 2012-05-03 MED ORDER — DIPHENHYDRAMINE HCL 50 MG/ML IJ SOLN
50.0000 mg | Freq: Once | INTRAMUSCULAR | Status: AC
  Administered 2012-05-03: 50 mg via INTRAVENOUS

## 2012-05-03 MED ORDER — PACLITAXEL CHEMO INJECTION 300 MG/50ML
80.0000 mg/m2 | Freq: Once | INTRAVENOUS | Status: AC
  Administered 2012-05-03: 138 mg via INTRAVENOUS
  Filled 2012-05-03: qty 23

## 2012-05-03 MED ORDER — SODIUM CHLORIDE 0.9 % IV SOLN
660.0000 mg | Freq: Once | INTRAVENOUS | Status: AC
  Administered 2012-05-03: 660 mg via INTRAVENOUS
  Filled 2012-05-03: qty 66

## 2012-05-03 MED ORDER — SODIUM CHLORIDE 0.9 % IJ SOLN
10.0000 mL | INTRAMUSCULAR | Status: DC | PRN
  Filled 2012-05-03: qty 10

## 2012-05-03 MED ORDER — FAMOTIDINE IN NACL 20-0.9 MG/50ML-% IV SOLN
20.0000 mg | Freq: Once | INTRAVENOUS | Status: AC
  Administered 2012-05-03: 20 mg via INTRAVENOUS

## 2012-05-03 MED ORDER — DEXAMETHASONE SODIUM PHOSPHATE 4 MG/ML IJ SOLN
20.0000 mg | Freq: Once | INTRAMUSCULAR | Status: AC
  Administered 2012-05-03: 20 mg via INTRAVENOUS

## 2012-05-03 MED ORDER — SODIUM CHLORIDE 0.9 % IV SOLN
Freq: Once | INTRAVENOUS | Status: AC
  Administered 2012-05-03: 13:00:00 via INTRAVENOUS

## 2012-05-03 MED ORDER — ONDANSETRON 16 MG/50ML IVPB (CHCC)
16.0000 mg | Freq: Once | INTRAVENOUS | Status: AC
  Administered 2012-05-03: 16 mg via INTRAVENOUS

## 2012-05-03 MED ORDER — HEPARIN SOD (PORK) LOCK FLUSH 100 UNIT/ML IV SOLN
500.0000 [IU] | Freq: Once | INTRAVENOUS | Status: DC | PRN
  Filled 2012-05-03: qty 5

## 2012-05-03 NOTE — Patient Instructions (Addendum)
[facility] Discharge Instructions for Patients Receiving Chemotherapy  Today you received the following chemotherapy agents TAXOL/CARBOPLATIN  To help prevent nausea and vomiting after your treatment, we encourage you to take your nausea medication AS DIRECTED.   If you develop nausea and vomiting that is not controlled by your nausea medication, call the clinic. If it is after clinic hours your family physician or the after hours number for the clinic or go to the Emergency Department.   BELOW ARE SYMPTOMS THAT SHOULD BE REPORTED IMMEDIATELY:  *FEVER GREATER THAN 100.5 F  *CHILLS WITH OR WITHOUT FEVER  NAUSEA AND VOMITING THAT IS NOT CONTROLLED WITH YOUR NAUSEA MEDICATION  *UNUSUAL SHORTNESS OF BREATH  *UNUSUAL BRUISING OR BLEEDING  TENDERNESS IN MOUTH AND THROAT WITH OR WITHOUT PRESENCE OF ULCERS  *URINARY PROBLEMS  *BOWEL PROBLEMS  UNUSUAL RASH Items with * indicate a potential emergency and should be followed up as soon as possible.  One of the nurses will contact you 24 hours after your treatment. Please let the nurse know about any problems that you may have experienced. Feel free to call the clinic you have any questions or concerns. The clinic phone number is [phone].   I have been informed and understand all the instructions given to me. I know to contact the clinic, my physician, or go to the Emergency Department if any problems should occur. I do not have any questions at this time, but understand that I may call the clinic during office hours   should I have any questions or need assistance in obtaining follow up care.    __________________________________________  _____________  __________ Signature of Patient or Authorized Representative            Date                   Time    __________________________________________ Nurse's Signature

## 2012-05-06 ENCOUNTER — Telehealth: Payer: Self-pay | Admitting: *Deleted

## 2012-05-06 NOTE — Telephone Encounter (Signed)
PT. HAD SOME NAUSEA WHICH WAS RESOLVED AFTER TAKING HER ANTIEMETIC MEDICATION. SHE HAS ALSO EXPERIENCED SOME TINGLING IN RIGHT AND LEFT ARMS. PT. SEE DR.LIVESAY TOMORROW. NO OTHER QUESTIONS OR PROBLEMS. SHE WILL CALL THE OFFICE IF THE NEED ARISES.

## 2012-05-07 ENCOUNTER — Encounter: Payer: Self-pay | Admitting: Oncology

## 2012-05-07 ENCOUNTER — Other Ambulatory Visit (HOSPITAL_BASED_OUTPATIENT_CLINIC_OR_DEPARTMENT_OTHER): Payer: BC Managed Care – PPO | Admitting: Lab

## 2012-05-07 ENCOUNTER — Ambulatory Visit (HOSPITAL_BASED_OUTPATIENT_CLINIC_OR_DEPARTMENT_OTHER): Payer: BC Managed Care – PPO | Admitting: Oncology

## 2012-05-07 VITALS — BP 111/74 | HR 118 | Temp 97.3°F | Resp 18 | Ht 67.0 in | Wt 135.4 lb

## 2012-05-07 DIAGNOSIS — R110 Nausea: Secondary | ICD-10-CM

## 2012-05-07 DIAGNOSIS — C569 Malignant neoplasm of unspecified ovary: Secondary | ICD-10-CM

## 2012-05-07 DIAGNOSIS — D649 Anemia, unspecified: Secondary | ICD-10-CM

## 2012-05-07 LAB — CBC WITH DIFFERENTIAL/PLATELET
Eosinophils Absolute: 0.4 10*3/uL (ref 0.0–0.5)
HCT: 36.5 % (ref 34.8–46.6)
LYMPH%: 28.7 % (ref 14.0–49.7)
MCV: 88.1 fL (ref 79.5–101.0)
MONO#: 0.2 10*3/uL (ref 0.1–0.9)
NEUT#: 2.7 10*3/uL (ref 1.5–6.5)
NEUT%: 58.2 % (ref 38.4–76.8)
Platelets: 222 10*3/uL (ref 145–400)
WBC: 4.6 10*3/uL (ref 3.9–10.3)

## 2012-05-07 NOTE — Progress Notes (Signed)
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 Patient has not had any acute problems with start of chemotherapy, tho she slept for ~ 24 hours after steroids were out of her system.  Patient had last gyn exam spring 2013 [facility]). She felt in usual excellent health thru Sept 2013,very athletic including 50 mile biking at [employer] in Sept, also swimming and hiking. In Oct 2013 she was more fatigued, with some fecal urge incontinence and abdominal fullness such that she began to diet to lose weight. By Fabienne Bruns she could not climb one flight of stairs and could walk only from office to car without SOB and coughing. She was seen at urgent care in Dec with bilateral pleural effusions on CXR, referred to Dr Francella Solian, with 1.6 liter right thoracentesis done 03-11-12 (no path in this EMR). She had CT CAP and CT angio chest with large bilateral pleural effusions, no PE, ascites and pelvic mass (see below). She was admitted 1-6 thru 04-06-12, with US paracentesis for 1.4 liters on 04-02-12 and left thoracentesis for 2 liters on 04-05-12. CA 125 was 2418. She was seen in consultation in hospital by Dr Nelly Rout on 04-02-12, then readmitted for surgery by gyn oncology on 04-09-12. The procedure by Dr Nelly Rout was exploratory laparotomy with TAH/BSO, radical debulking and omentectomy, which was optimal debulking with only residual at completion of procedure being 7 mm area on posterior surface of right lobe liver. She was transfused 2 units PRBCs on 04-10-12 for Hgb down to 7.2 (from 12 preop). Post operative course otherwise was not remarkable and she was DC home on 04-13-12. Pathology [phone]) high grade serous carcinoma involving right pelvic mass, serosa of uterus and peritoneum, omentum negative, no  nodes submitted. Recommendation from gyn oncology was for dose dense carbo taxol, begun 05-03-12.   Peripheral IV access was not difficult for first chemo. Patient took premed decadron 20 mg 12 hrs and 6 hrs prior to taxol as instructed. By 05-05-12 she was generally fatigued, and she slept all day yesterday, which is completely atypical for her. She had one episode of nausea, but did not use ativan; she has some slight nausea also today. She has been able to eat and to drink fluids. Bowels have been moving and she is voiding. She denies any aches or peripheral neuropathy symptoms. She has had no bleeding, no fever or symptoms of infection. Remainder of 10 point Review of Systems negative.  Objective:  Vital signs in last 24 hours:  BP 111/74  Pulse 118  Temp(Src) 97.3 F (36.3 C) (Oral)  Resp 18  Ht 5\' 7"  (1.702 m)  Wt 135 lb 6.4 oz (61.417 kg)  BMI 21.2 kg/m2 Weight is up 2 lbs.  Easily mobile, alert and appropriate, looks mildly uncomfortable. Respirations not labored RA. Hair cut short now.   HEENT:PERRLA, sclera clear, anicteric and oropharynx clear, no lesions LymphaticsCervical, supraclavicular, and axillary nodes normal. Resp: clear to auscultation bilaterally and normal percussion bilaterally Cardio: regular rate and rhythm GI: soft, not distended, few bowel sounds. Midline incision with minimal erythema, no tenderness centrally, completely closed Extremities: extremities normal, atraumatic, no cyanosis or edema Neuro:no sensory deficits noted Skin without ecchymosis or rash.  Lab Results:  Results for orders placed in visit on 05/07/12  CBC WITH DIFFERENTIAL  Result Value Range   WBC 4.6  3.9 - 10.3 10e3/uL   NEUT# 2.7  1.5 - 6.5 10e3/uL   HGB 12.5  11.6 - 15.9 g/dL   HCT 16.1  09.6 - 04.5 %   Platelets 222  145 - 400 10e3/uL   MCV 88.1  79.5 - 101.0 fL   MCH 30.2  25.1 - 34.0 pg   MCHC 34.3  31.5 - 36.0 g/dL   RBC 4.09  8.11 - 9.14 10e6/uL   RDW 16.4 (*) 11.2 -  14.5 %   lymph# 1.3  0.9 - 3.3 10e3/uL   MONO# 0.2  0.1 - 0.9 10e3/uL   Eosinophils Absolute 0.4  0.0 - 0.5 10e3/uL   Basophils Absolute 0.0  0.0 - 0.1 10e3/uL   NEUT% 58.2  38.4 - 76.8 %   LYMPH% 28.7  14.0 - 49.7 %   MONO% 4.1  0.0 - 14.0 %   EOS% 8.7 (*) 0.0 - 7.0 %   BASO% 0.3  0.0 - 2.0 %     Studies/Results:  No results found.  Medications: I have reviewed the patient's current medications. Dr Sherene Sires did not refill the synthroid begun for question of thyroid abnormality prior to diagnosis of the gyn cancer, and she will remain off of this for now. Will decrease oral premed decadron to 20 mg 12 hrs prior to treatment; discussed fatigue 1-2 days after decadron. I have encouraged her to use zofran today if the slight nausea persists.  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 She will continue weekly treatments following counts, has had no gCSF as yet. Day 8 cycle 1 on 05-10-12, to see Dr Nelly Rout 05-16-12, day 15 cycle 1 on 05-17-12. I will see her again at least on 2-25 with lab prior to day 1 cycle 2 on 05-24-12. 2.slight preexisting peripheral neuropathy fingertips right which we will follow on the taxol  3.post operative anemia, improved  4.overdue mammograms which we will arrange probably after this intensive treatment is completed. Post breast augmentations.  5.no colonoscopy, which she would also like done after chemotherapy  6. Flu vaccine given  Patient was comfortable with discussion and plan.  Dameir Gentzler P, MD   05/07/2012, 11:22 AM

## 2012-05-07 NOTE — Patient Instructions (Signed)
Will decrease decadron (steroid) to just one dose of five tablets (=20 mg) 12 hours before chemotherapy. Take with food.

## 2012-05-10 ENCOUNTER — Other Ambulatory Visit (HOSPITAL_BASED_OUTPATIENT_CLINIC_OR_DEPARTMENT_OTHER): Payer: BC Managed Care – PPO | Admitting: Lab

## 2012-05-10 ENCOUNTER — Ambulatory Visit (HOSPITAL_BASED_OUTPATIENT_CLINIC_OR_DEPARTMENT_OTHER): Payer: BC Managed Care – PPO

## 2012-05-10 VITALS — BP 101/68 | HR 85 | Temp 98.3°F

## 2012-05-10 DIAGNOSIS — C569 Malignant neoplasm of unspecified ovary: Secondary | ICD-10-CM

## 2012-05-10 DIAGNOSIS — Z5111 Encounter for antineoplastic chemotherapy: Secondary | ICD-10-CM

## 2012-05-10 LAB — CBC WITH DIFFERENTIAL/PLATELET
BASO%: 0 % (ref 0.0–2.0)
EOS%: 0 % (ref 0.0–7.0)
HCT: 34.2 % — ABNORMAL LOW (ref 34.8–46.6)
LYMPH%: 11.6 % — ABNORMAL LOW (ref 14.0–49.7)
MCH: 29.2 pg (ref 25.1–34.0)
MCHC: 32.7 g/dL (ref 31.5–36.0)
MCV: 89.1 fL (ref 79.5–101.0)
MONO%: 0.2 % (ref 0.0–14.0)
NEUT%: 88.2 % — ABNORMAL HIGH (ref 38.4–76.8)
Platelets: 225 10*3/uL (ref 145–400)

## 2012-05-10 MED ORDER — SODIUM CHLORIDE 0.9 % IV SOLN
Freq: Once | INTRAVENOUS | Status: AC
  Administered 2012-05-10: 12:00:00 via INTRAVENOUS

## 2012-05-10 MED ORDER — FAMOTIDINE IN NACL 20-0.9 MG/50ML-% IV SOLN
20.0000 mg | Freq: Once | INTRAVENOUS | Status: AC
  Administered 2012-05-10: 20 mg via INTRAVENOUS

## 2012-05-10 MED ORDER — ONDANSETRON 8 MG/50ML IVPB (CHCC)
8.0000 mg | Freq: Once | INTRAVENOUS | Status: AC
  Administered 2012-05-10: 8 mg via INTRAVENOUS

## 2012-05-10 MED ORDER — PACLITAXEL CHEMO INJECTION 300 MG/50ML
80.0000 mg/m2 | Freq: Once | INTRAVENOUS | Status: AC
  Administered 2012-05-10: 138 mg via INTRAVENOUS
  Filled 2012-05-10: qty 23

## 2012-05-10 MED ORDER — DEXAMETHASONE SODIUM PHOSPHATE 4 MG/ML IJ SOLN
20.0000 mg | Freq: Once | INTRAMUSCULAR | Status: AC
  Administered 2012-05-10: 20 mg via INTRAVENOUS

## 2012-05-10 MED ORDER — DIPHENHYDRAMINE HCL 50 MG/ML IJ SOLN
50.0000 mg | Freq: Once | INTRAMUSCULAR | Status: AC
  Administered 2012-05-10: 50 mg via INTRAVENOUS

## 2012-05-10 NOTE — Patient Instructions (Addendum)
[facility] Discharge Instructions for Patients Receiving Chemotherapy  Today you received the following chemotherapy agents: Taxol  To help prevent nausea and vomiting after your treatment, we encourage you to take your nausea medication as directed by your MD.  If you develop nausea and vomiting that is not controlled by your nausea medication, call the clinic. If it is after clinic hours your family physician or the after hours number for the clinic or go to the Emergency Department.   BELOW ARE SYMPTOMS THAT SHOULD BE REPORTED IMMEDIATELY:  *FEVER GREATER THAN 100.5 F  *CHILLS WITH OR WITHOUT FEVER  NAUSEA AND VOMITING THAT IS NOT CONTROLLED WITH YOUR NAUSEA MEDICATION  *UNUSUAL SHORTNESS OF BREATH  *UNUSUAL BRUISING OR BLEEDING  TENDERNESS IN MOUTH AND THROAT WITH OR WITHOUT PRESENCE OF ULCERS  *URINARY PROBLEMS  *BOWEL PROBLEMS  UNUSUAL RASH Items with * indicate a potential emergency and should be followed up as soon as possible.   Feel free to call the clinic you have any questions or concerns. The clinic phone number is [phone].

## 2012-05-16 ENCOUNTER — Encounter: Payer: Self-pay | Admitting: Gynecologic Oncology

## 2012-05-16 ENCOUNTER — Ambulatory Visit: Payer: BC Managed Care – PPO | Attending: Gynecologic Oncology | Admitting: Gynecologic Oncology

## 2012-05-16 VITALS — BP 100/64 | HR 64 | Temp 97.9°F | Resp 16 | Ht 68.11 in | Wt 138.8 lb

## 2012-05-16 DIAGNOSIS — C569 Malignant neoplasm of unspecified ovary: Secondary | ICD-10-CM

## 2012-05-16 DIAGNOSIS — R188 Other ascites: Secondary | ICD-10-CM | POA: Insufficient documentation

## 2012-05-16 NOTE — Patient Instructions (Addendum)
Followup with GYN Onc in 3 months  Thank you very much Shelley Sanchez for allowing me to provide care for you today.  I appreciate your confidence in choosing our Gynecologic Oncology team.  If you have any questions about your visit today please call our office and we will get back to you as soon as possible.  [name]. Rumaysa Sabatino MD., PhD Gynecologic Oncology

## 2012-05-16 NOTE — Progress Notes (Signed)
Office Visit Note: Gyn-Onc  Shelley Sanchez 57 y.o. female  CC:  Chief Complaint  Patient presents with  . Ovarian Cancer    Follow up    HPI: 57 y.o. year old G3 P3 LNMP in early 32's. In October 2013 Shelley Sanchez noted fatigue, and fecal urge incontinence. Also noted increasing abdominal girth. No change in appetite, no nausea or vomiting. Reports SOB onset in November 2013. Denies uterine bleeding, no abdominal pain just discomfort. Was evaluated in urgent care care in December 2013 for malaise and referred to Dr. Sherene Sires (Pulmonology) Thoracocentesis performed prior to Christmas and the cytology was negative for malignancy. Reported labored breathing 03/26/2012 and return of pleural effusions was noted. CT Chest/Abd/Pelvis 04/01/2012 notable for ascites so patient was admitted for evaluation. Paracentesis performed 04/01/2012. Results were negative for malignancy.  Patient reports discomfort chest heaviness And cough when lying flat. Reports 20 pound weight loss in the past year. Was able to ride 50 miles, and swam regularly until September 2013.   CT Chest Abd/Pelvis: 04/01/2012  IMPRESSION:  Large bilateral pleural effusions with extensive passive atelectasis in both lungs.  Abdomen/Pelvis: 3.7 x 3.1 cm low attenuation lesion in segment 7 of the liver. Smaller subcentimeter low attenuation lesion is also noted in segment 7 of the liver (image 53 of series 2) There is a large volume of ascites throughout the peritoneal cavity. In the pelvis there is a complex heterogeneous appearing soft tissue mass that appears to have multiple internal cystic components and some small amounts of calcium that measures at least 12.5 x 12.7 x 11.8 cm.    Shelley Sanchez underwent exploratory laparotomy total abdominal hysterectomy bilateral salpingo-oophorectomy omentectomy radical debulking to optimal disease on 04/09/2012.   The only gross disease present was a 7 mm lesion in the posterior most aspect of the liver.   Path: 1. 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 2. Adnexa - ovary +/- tube, neoplastic, right, and portion of right pelvic mass - HIGH GRADE SEROUS CARCINOMA. 3. Uterus and cervix, with left fallopian tube and left ovary - SEROSA: HIGH GRADE SEROUS CARCINOMA. - CERVIX: UNREMARKABLE. - ENDOMETRIUM: ATROPHIC - LEFT OVARY AND FALLOPIAN TUBE: BENIGN. NO TUMOR IDENTIFIED. 4. Cul-de-sac biopsy, posterior nodule - HIGH GRADE SEROUS CARCINOMA. 5. Peritoneum, biopsy, anterior pelvic - HIGH GRADE SEROUS CARCINOMA. 6. Peritoneum, biopsy, right peri colic gutter - HIGH GRADE SEROUS CARCINOMA 7. Omentum, resection for tumor - FINDINGS CONSISTENT WITH ADHESIONS. NO TUMOR IDENTIFIED. 8. Soft tissue, biopsy, diaphromatic nodule - HIGH GRADE SEROUS CARCINOMA.  Interval History:  The patient s is doing very well. She has received cycle one week 2 of her first cycle of dose dense Taxol carboplatin therapy     Social Hx:   History   Social History  . Marital Status: Widowed    Spouse Name: N/A    Number of Children: 3  . Years of Education: N/A   Occupational History  . Not on file.   Social History Main Topics  . Smoking status: Never Smoker   . Smokeless tobacco: Never Used  . Alcohol Use: No  . Drug Use: No  . Sexually Active: Yes   Other Topics Concern  . Not on file   Social History Narrative  . No narrative on file    Past Surgical Hx:  Past Surgical History  Procedure Laterality Date  . Breast enhancement surgery  2000  . Laparotomy  04/09/2012    Procedure:  EXPLORATORY LAPAROTOMY;  Surgeon: Laurette Schimke, MD PHD;  Location: [facility];  Service: Gynecology;  Laterality: N/A;  EXPLORATORY LAPAROTOMY, TOTAL ABDOMINAL HYESTERCTOMY, BILATERAL SALPINGO OOPHERECTOMY  . Abdominal hysterectomy  04/09/2012    Procedure: HYSTERECTOMY ABDOMINAL;  Surgeon: Laurette Schimke, MD PHD;  Location: [facility];  Service:  Gynecology;  Laterality: N/A;    Past Medical Hx:  Past Medical History  Diagnosis Date  . Skin cancer   . Shortness of breath     bilateral pleural effusions - s/p thoracentesis  . Ascites     s/p paracentesis  . Pelvic mass     Family Hx:  Family History  Problem Relation Age of Onset  . Melanoma Mother   . Skin cancer Brother     Vitals:  Blood pressure 100/64, pulse 64, temperature 97.9 F (36.6 C), temperature source Oral, resp. rate 16, height 5' 8.11" (1.73 m), weight 138 lb 12.8 oz (62.959 kg).  Physical Exam:  Thin female in no acute distress. Chest decreased breath sounds bilaterally in the lower one third of the lung bases.  Abdomen:  Abdomen soft, nontender and nondistended. Incision clean dry and intact. PELVIC:  Nl EGBUS, No vaginal discharge or bleeding.  Cuff intact, no pelvic masses.  Assessment/Plan:  58 y.o. -year-old with abdominal pelvic mass, ascites, and pleural effusions. CA 125 is elevated to about 2400 and CEA is normal.  She underwent optimal debulking on 04/09/2012. 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   Recommendations made for the patient were for dose dense  Taxol carboplatin therapy. She's currently in the first cycle.  Followup with GYN Onc in 3 months  Laurette Schimke, MD 05/16/2012, 6:21 PM

## 2012-05-17 ENCOUNTER — Other Ambulatory Visit (HOSPITAL_BASED_OUTPATIENT_CLINIC_OR_DEPARTMENT_OTHER): Payer: BC Managed Care – PPO | Admitting: Lab

## 2012-05-17 ENCOUNTER — Ambulatory Visit (HOSPITAL_BASED_OUTPATIENT_CLINIC_OR_DEPARTMENT_OTHER): Payer: BC Managed Care – PPO

## 2012-05-17 VITALS — BP 112/59 | HR 103 | Temp 97.7°F | Resp 16

## 2012-05-17 DIAGNOSIS — C569 Malignant neoplasm of unspecified ovary: Secondary | ICD-10-CM

## 2012-05-17 DIAGNOSIS — Z5111 Encounter for antineoplastic chemotherapy: Secondary | ICD-10-CM

## 2012-05-17 LAB — CBC WITH DIFFERENTIAL/PLATELET
BASO%: 0.3 % (ref 0.0–2.0)
EOS%: 0 % (ref 0.0–7.0)
LYMPH%: 15 % (ref 14.0–49.7)
MCH: 29.5 pg (ref 25.1–34.0)
MCHC: 32.7 g/dL (ref 31.5–36.0)
MONO#: 0.1 10*3/uL (ref 0.1–0.9)
Platelets: 205 10*3/uL (ref 145–400)
RBC: 3.69 10*6/uL — ABNORMAL LOW (ref 3.70–5.45)
WBC: 3 10*3/uL — ABNORMAL LOW (ref 3.9–10.3)
lymph#: 0.5 10*3/uL — ABNORMAL LOW (ref 0.9–3.3)
nRBC: 0 % (ref 0–0)

## 2012-05-17 MED ORDER — PACLITAXEL CHEMO INJECTION 300 MG/50ML
80.0000 mg/m2 | Freq: Once | INTRAVENOUS | Status: AC
  Administered 2012-05-17: 138 mg via INTRAVENOUS
  Filled 2012-05-17: qty 23

## 2012-05-17 MED ORDER — DIPHENHYDRAMINE HCL 50 MG/ML IJ SOLN
50.0000 mg | Freq: Once | INTRAMUSCULAR | Status: AC
  Administered 2012-05-17: 50 mg via INTRAVENOUS

## 2012-05-17 MED ORDER — ONDANSETRON 8 MG/50ML IVPB (CHCC)
8.0000 mg | Freq: Once | INTRAVENOUS | Status: AC
  Administered 2012-05-17: 8 mg via INTRAVENOUS

## 2012-05-17 MED ORDER — FAMOTIDINE IN NACL 20-0.9 MG/50ML-% IV SOLN
20.0000 mg | Freq: Once | INTRAVENOUS | Status: AC
  Administered 2012-05-17: 20 mg via INTRAVENOUS

## 2012-05-17 MED ORDER — SODIUM CHLORIDE 0.9 % IV SOLN
Freq: Once | INTRAVENOUS | Status: AC
  Administered 2012-05-17: 12:00:00 via INTRAVENOUS

## 2012-05-17 MED ORDER — DEXAMETHASONE SODIUM PHOSPHATE 4 MG/ML IJ SOLN
20.0000 mg | Freq: Once | INTRAMUSCULAR | Status: AC
  Administered 2012-05-17: 20 mg via INTRAVENOUS

## 2012-05-17 NOTE — Patient Instructions (Signed)
[facility] Discharge Instructions for Patients Receiving Chemotherapy  Today you received the following chemotherapy agents Taxol.  To help prevent nausea and vomiting after your treatment, we encourage you to take your nausea medication.   If you develop nausea and vomiting that is not controlled by your nausea medication, call the clinic. If it is after clinic hours your family physician or the after hours number for the clinic or go to the Emergency Department.   BELOW ARE SYMPTOMS THAT SHOULD BE REPORTED IMMEDIATELY:  *FEVER GREATER THAN 100.5 F  *CHILLS WITH OR WITHOUT FEVER  NAUSEA AND VOMITING THAT IS NOT CONTROLLED WITH YOUR NAUSEA MEDICATION  *UNUSUAL SHORTNESS OF BREATH  *UNUSUAL BRUISING OR BLEEDING  TENDERNESS IN MOUTH AND THROAT WITH OR WITHOUT PRESENCE OF ULCERS  *URINARY PROBLEMS  *BOWEL PROBLEMS  UNUSUAL RASH Items with * indicate a potential emergency and should be followed up as soon as possible.  One of the nurses will contact you 24 hours after your treatment. Please let the nurse know about any problems that you may have experienced. Feel free to call the clinic you have any questions or concerns. The clinic phone number is [phone].   I have been informed and understand all the instructions given to me. I know to contact the clinic, my physician, or go to the Emergency Department if any problems should occur. I do not have any questions at this time, but understand that I may call the clinic during office hours   should I have any questions or need assistance in obtaining follow up care.    __________________________________________  _____________  __________ Signature of Patient or Authorized Representative            Date                   Time    __________________________________________ Nurse's Signature    

## 2012-05-19 ENCOUNTER — Other Ambulatory Visit: Payer: Self-pay | Admitting: Oncology

## 2012-05-21 ENCOUNTER — Telehealth: Payer: Self-pay | Admitting: *Deleted

## 2012-05-21 ENCOUNTER — Encounter: Payer: Self-pay | Admitting: Oncology

## 2012-05-21 ENCOUNTER — Ambulatory Visit (HOSPITAL_BASED_OUTPATIENT_CLINIC_OR_DEPARTMENT_OTHER): Payer: BC Managed Care – PPO | Admitting: Oncology

## 2012-05-21 ENCOUNTER — Other Ambulatory Visit: Payer: Self-pay

## 2012-05-21 ENCOUNTER — Other Ambulatory Visit (HOSPITAL_BASED_OUTPATIENT_CLINIC_OR_DEPARTMENT_OTHER): Payer: BC Managed Care – PPO | Admitting: Lab

## 2012-05-21 ENCOUNTER — Telehealth: Payer: Self-pay | Admitting: Oncology

## 2012-05-21 VITALS — BP 90/65 | HR 99 | Temp 97.5°F | Resp 18 | Ht 67.25 in | Wt 139.0 lb

## 2012-05-21 DIAGNOSIS — C561 Malignant neoplasm of right ovary: Secondary | ICD-10-CM | POA: Insufficient documentation

## 2012-05-21 DIAGNOSIS — Z5189 Encounter for other specified aftercare: Secondary | ICD-10-CM

## 2012-05-21 DIAGNOSIS — D649 Anemia, unspecified: Secondary | ICD-10-CM

## 2012-05-21 DIAGNOSIS — C569 Malignant neoplasm of unspecified ovary: Secondary | ICD-10-CM

## 2012-05-21 DIAGNOSIS — G609 Hereditary and idiopathic neuropathy, unspecified: Secondary | ICD-10-CM

## 2012-05-21 LAB — CBC WITH DIFFERENTIAL/PLATELET
BASO%: 0.4 % (ref 0.0–2.0)
Eosinophils Absolute: 0 10*3/uL (ref 0.0–0.5)
HCT: 30.9 % — ABNORMAL LOW (ref 34.8–46.6)
MCHC: 34.4 g/dL (ref 31.5–36.0)
MONO#: 0.1 10*3/uL (ref 0.1–0.9)
NEUT#: 1.6 10*3/uL (ref 1.5–6.5)
NEUT%: 54.8 % (ref 38.4–76.8)
WBC: 2.9 10*3/uL — ABNORMAL LOW (ref 3.9–10.3)
lymph#: 1.2 10*3/uL (ref 0.9–3.3)

## 2012-05-21 LAB — COMPREHENSIVE METABOLIC PANEL (CC13)
ALT: 28 U/L (ref 0–55)
AST: 12 U/L (ref 5–34)
Albumin: 3.3 g/dL — ABNORMAL LOW (ref 3.5–5.0)
BUN: 11.6 mg/dL (ref 7.0–26.0)
CO2: 31 mEq/L — ABNORMAL HIGH (ref 22–29)
Calcium: 9.2 mg/dL (ref 8.4–10.4)
Chloride: 104 mEq/L (ref 98–107)
Potassium: 4.1 mEq/L (ref 3.5–5.1)

## 2012-05-21 LAB — CA 125: CA 125: 104.2 U/mL — ABNORMAL HIGH (ref 0.0–30.2)

## 2012-05-21 MED ORDER — DEXAMETHASONE 4 MG PO TABS: ORAL_TABLET | ORAL | Status: DC

## 2012-05-21 MED ORDER — FILGRASTIM 300 MCG/0.5ML IJ SOLN
300.0000 ug | Freq: Once | INTRAMUSCULAR | Status: AC
  Administered 2012-05-21: 300 ug via SUBCUTANEOUS
  Filled 2012-05-21: qty 0.5

## 2012-05-21 NOTE — Telephone Encounter (Signed)
Per staff message and POF I have scheduled appts.  [name]  

## 2012-05-21 NOTE — Progress Notes (Signed)
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 She has had no neupogen thus far, however ANC is trending down and we will add ~ 2-3 neupogen injections per full 3 week cycle (dates depending on her work schedule). Peripheral IV access has been good. She has had no nausea and less fatigue post steroids since premed decadron was decreased to 20 mg 12 hrs prior to taxol.  Patient had last gyn exam spring 2013 in [place]. She felt in usual excellent health thru Sept 2013,very athletic including 50 mile biking at [employer] in Sept, also swimming and hiking. In Oct 2013 she was more fatigued, with some fecal urge incontinence and abdominal fullness such that she began to diet to lose weight. By Fabienne Bruns she could not climb one flight of stairs and could walk only from office to car without SOB and coughing. She was seen at urgent care in Dec with bilateral pleural effusions on CXR, referred to Dr Francella Solian, with 1.6 liter right thoracentesis done 03-11-12 (no path in this EMR). She had CT CAP and CT angio chest with large bilateral pleural effusions, no PE, ascites and pelvic mass (see below). She was admitted 1-6 thru 04-06-12, with US paracentesis for 1.4 liters on 04-02-12 and left thoracentesis for 2 liters on 04-05-12. CA 125 was 2418. She was seen in consultation in hospital by Dr Nelly Rout on 04-02-12, then readmitted for surgery by gyn oncology on 04-09-12. The procedure by Dr Nelly Rout was exploratory laparotomy with TAH/BSO, radical debulking and omentectomy, which was optimal debulking with only residual at completion of procedure being 7 mm area on posterior surface of right lobe liver. She was transfused 2  units PRBCs on 04-10-12 for Hgb down to 7.2 (from 12 preop). Post operative course otherwise was not remarkable and she was DC home on 04-13-12. Pathology [phone]) high grade serous carcinoma involving right pelvic mass, serosa of uterus and peritoneum, omentum negative, no nodes submitted. Adjuvant therapy with dose dense taxol/ carboplatin began 05-03-12.  ROS as above. Also no shortness of breath with housecleaning, climbing stairs, walking, tho she has not resumed bike riding. No chest discomfort with deep inspiration, no cough. Good appetite, no taste changes. No complaints of new peripheral neuropathy. No bleeding. Bowels moving daily without laxative. Bladder fine. Sleeping well. Will go back to work tomorrow. Remainder of 10 point Review of Systems negative.  Work schedule is daily 0730 - 1730 with every other Sat/Tues off. I am glad to do additional FMLA papers if needed for intermittent time off.  Objective:  Vital signs in last 24 hours:  BP 90/65  Pulse 99  Temp(Src) 97.5 F (36.4 C) (Oral)  Resp 18  Ht 5' 7.25" (1.708 m)  Wt 139 lb (63.05 kg)  BMI 21.61 kg/m2 Weight is up 3.5 lbs. Easily mobile, looks comfortable. Hair is thinning. Respirations not labored RA.   HEENT:PERRLA, sclera clear, anicteric, oropharynx clear, no lesions and neck supple with midline trachea LymphaticsCervical, supraclavicular, and axillary nodes normal. Resp: clear to auscultation bilaterally and normal percussion bilaterally Cardio: regular rate and rhythm GI: soft, non-tender; bowel sounds normal; no masses,  no organomegaly Midline incision well healed, with probable suture in subQ  at lower 1/3, not tender or erythematous. Extremities: extremities normal, atraumatic, no cyanosis or edema Neuro:no sensory deficits noted Skin without rash or ecchymosis No central catheter  Lab Results:  Results for orders placed in visit on 05/21/12  CBC WITH DIFFERENTIAL      Result Value Range   WBC 2.9 (*)  3.9 - 10.3 10e3/uL   NEUT# 1.6  1.5 - 6.5 10e3/uL   HGB 10.6 (*) 11.6 - 15.9 g/dL   HCT 16.1 (*) 09.6 - 04.5 %   Platelets 187  145 - 400 10e3/uL   MCV 88.7  79.5 - 101.0 fL   MCH 30.5  25.1 - 34.0 pg   MCHC 34.4  31.5 - 36.0 g/dL   RBC 4.09 (*) 8.11 - 9.14 10e6/uL   RDW 16.9 (*) 11.2 - 14.5 %   lymph# 1.2  0.9 - 3.3 10e3/uL   MONO# 0.1  0.1 - 0.9 10e3/uL   Eosinophils Absolute 0.0  0.0 - 0.5 10e3/uL   Basophils Absolute 0.0  0.0 - 0.1 10e3/uL   NEUT% 54.8  38.4 - 76.8 %   LYMPH% 39.8  14.0 - 49.7 %   MONO% 3.6  0.0 - 14.0 %   EOS% 1.4  0.0 - 7.0 %   BASO% 0.4  0.0 - 2.0 %  COMPREHENSIVE METABOLIC PANEL (CC13)      Result Value Range   Sodium 141  136 - 145 mEq/L   Potassium 4.1  3.5 - 5.1 mEq/L   Chloride 104  98 - 107 mEq/L   CO2 31 (*) 22 - 29 mEq/L   Glucose 94  70 - 99 mg/dl   BUN 78.2  7.0 - 95.6 mg/dL   Creatinine 0.8  0.6 - 1.1 mg/dL   Total Bilirubin 2.13  0.20 - 1.20 mg/dL   Alkaline Phosphatase 85  40 - 150 U/L   AST 12  5 - 34 U/L   ALT 28  0 - 55 U/L   Total Protein 6.6  6.4 - 8.3 g/dL   Albumin 3.3 (*) 3.5 - 5.0 g/dL   Calcium 9.2  8.4 - 08.6 mg/dL    CA 578 pending today. Studies/Results:  No results found.  Medications: I have reviewed the patient's current medications. We will continue premed decadron at 20 mg 12 hrs prior to taxol. We will add neupogen on days 2,9,16 when she is not working on those Saturdays, otherwise on Tuesdays after each Fri treatment when she is off on Tuesday; plan above will give her one neupogen injection after each chemotherapy, which hopefully will allow [place] to continue on schedule with full doses. She was given neupogen today after teaching done by RN.   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 She will have day 1 cycle 2 on 05-24-12 and will begin some neupogen support as above. 2.slight  preexisting peripheral neuropathy fingertips right which we will follow on the taxol  3.post operative anemia, improved  4.overdue mammograms which we will arrange probably after this intensive treatment is completed. Post breast augmentations.  5.no colonoscopy, which she would also like done after chemotherapy  6. Flu vaccine given  Written note given for return to work and need for intermittent time off as chemo continues.  Patient was comfortable with discussion and plan. Chemo orders confirmed for cycle 2 and will add selected dates for neupogen when she lets [place] know  her schedule.  LIVESAY,LENNIS P, MD   05/21/2012, 12:58 PM

## 2012-05-21 NOTE — Telephone Encounter (Signed)
appts made and printed. pt aware that michelle will schedule her tx appts and we will call her with those d/t....td

## 2012-05-21 NOTE — Patient Instructions (Signed)
Please call Dr Precious Reel RN to let us know which Saturdays/ which Tuesdays you are off work, so that we can add in neupogen shots for your white blood count  Dr Darrold Span is glad to fill out FMLA papers for intermittent time out of work if you need those.

## 2012-05-24 ENCOUNTER — Other Ambulatory Visit (HOSPITAL_BASED_OUTPATIENT_CLINIC_OR_DEPARTMENT_OTHER): Payer: BC Managed Care – PPO

## 2012-05-24 ENCOUNTER — Ambulatory Visit (HOSPITAL_BASED_OUTPATIENT_CLINIC_OR_DEPARTMENT_OTHER): Payer: BC Managed Care – PPO

## 2012-05-24 ENCOUNTER — Other Ambulatory Visit: Payer: Self-pay | Admitting: Oncology

## 2012-05-24 DIAGNOSIS — C561 Malignant neoplasm of right ovary: Secondary | ICD-10-CM

## 2012-05-24 DIAGNOSIS — Z5111 Encounter for antineoplastic chemotherapy: Secondary | ICD-10-CM

## 2012-05-24 DIAGNOSIS — C569 Malignant neoplasm of unspecified ovary: Secondary | ICD-10-CM

## 2012-05-24 LAB — CBC WITH DIFFERENTIAL/PLATELET
EOS%: 0 % (ref 0.0–7.0)
LYMPH%: 10.1 % — ABNORMAL LOW (ref 14.0–49.7)
MCH: 29.5 pg (ref 25.1–34.0)
MCV: 89.6 fL (ref 79.5–101.0)
MONO%: 2.6 % (ref 0.0–14.0)
Platelets: 197 10*3/uL (ref <2.0)
RBC: 3.66 10*6/uL — ABNORMAL LOW (ref 3.70–5.45)
RDW: 16.4 % — ABNORMAL HIGH (ref 11.2–14.5)

## 2012-05-24 MED ORDER — DIPHENHYDRAMINE HCL 50 MG/ML IJ SOLN
50.0000 mg | Freq: Once | INTRAMUSCULAR | Status: AC
  Administered 2012-05-24: 50 mg via INTRAVENOUS

## 2012-05-24 MED ORDER — PACLITAXEL CHEMO INJECTION 300 MG/50ML
80.0000 mg/m2 | Freq: Once | INTRAVENOUS | Status: AC
  Administered 2012-05-24: 138 mg via INTRAVENOUS
  Filled 2012-05-24: qty 23

## 2012-05-24 MED ORDER — DEXAMETHASONE SODIUM PHOSPHATE 4 MG/ML IJ SOLN
20.0000 mg | Freq: Once | INTRAMUSCULAR | Status: AC
  Administered 2012-05-24: 20 mg via INTRAVENOUS

## 2012-05-24 MED ORDER — SODIUM CHLORIDE 0.9 % IV SOLN
595.2000 mg | Freq: Once | INTRAVENOUS | Status: AC
  Administered 2012-05-24: 600 mg via INTRAVENOUS
  Filled 2012-05-24: qty 60

## 2012-05-24 MED ORDER — FAMOTIDINE IN NACL 20-0.9 MG/50ML-% IV SOLN
20.0000 mg | Freq: Once | INTRAVENOUS | Status: AC
  Administered 2012-05-24: 20 mg via INTRAVENOUS

## 2012-05-24 MED ORDER — ONDANSETRON 16 MG/50ML IVPB (CHCC)
16.0000 mg | Freq: Once | INTRAVENOUS | Status: AC
Start: 2012-05-24 — End: 2012-05-24
  Administered 2012-05-24: 16 mg via INTRAVENOUS

## 2012-05-24 MED ORDER — SODIUM CHLORIDE 0.9 % IV SOLN
Freq: Once | INTRAVENOUS | Status: AC
  Administered 2012-05-24: 14:00:00 via INTRAVENOUS

## 2012-05-24 NOTE — Patient Instructions (Addendum)
[facility] Discharge Instructions for Patients Receiving Chemotherapy  Today you received the following chemotherapy agents: taxol, carboplatin  To help prevent nausea and vomiting after your treatment, we encourage you to take your nausea medication.  Take it as often as prescribed.     If you develop nausea and vomiting that is not controlled by your nausea medication, call the clinic. If it is after clinic hours your family physician or the after hours number for the clinic or go to the Emergency Department.   BELOW ARE SYMPTOMS THAT SHOULD BE REPORTED IMMEDIATELY:  *FEVER GREATER THAN 100.5 F  *CHILLS WITH OR WITHOUT FEVER  NAUSEA AND VOMITING THAT IS NOT CONTROLLED WITH YOUR NAUSEA MEDICATION  *UNUSUAL SHORTNESS OF BREATH  *UNUSUAL BRUISING OR BLEEDING  TENDERNESS IN MOUTH AND THROAT WITH OR WITHOUT PRESENCE OF ULCERS  *URINARY PROBLEMS  *BOWEL PROBLEMS  UNUSUAL RASH Items with * indicate a potential emergency and should be followed up as soon as possible.  Feel free to call the clinic you have any questions or concerns. The clinic phone number is [phone].   I have been informed and understand all the instructions given to me. I know to contact the clinic, my physician, or go to the Emergency Department if any problems should occur. I do not have any questions at this time, but understand that I may call the clinic during office hours   should I have any questions or need assistance in obtaining follow up care.    __________________________________________  _____________  __________ Signature of Patient or Authorized Representative            Date                   Time    __________________________________________ Nurse's Signature    

## 2012-05-25 ENCOUNTER — Other Ambulatory Visit: Payer: Self-pay | Admitting: Oncology

## 2012-05-28 ENCOUNTER — Ambulatory Visit (HOSPITAL_BASED_OUTPATIENT_CLINIC_OR_DEPARTMENT_OTHER): Payer: BC Managed Care – PPO

## 2012-05-28 VITALS — BP 105/65 | HR 116 | Temp 97.7°F

## 2012-05-28 DIAGNOSIS — C569 Malignant neoplasm of unspecified ovary: Secondary | ICD-10-CM

## 2012-05-28 DIAGNOSIS — Z5189 Encounter for other specified aftercare: Secondary | ICD-10-CM

## 2012-05-28 MED ORDER — FILGRASTIM 300 MCG/0.5ML IJ SOLN
300.0000 ug | INTRAMUSCULAR | Status: DC
  Administered 2012-05-28: 300 ug via SUBCUTANEOUS
  Filled 2012-05-28: qty 0.5

## 2012-05-31 ENCOUNTER — Telehealth: Payer: Self-pay

## 2012-05-31 ENCOUNTER — Ambulatory Visit: Payer: BC Managed Care – PPO

## 2012-05-31 ENCOUNTER — Other Ambulatory Visit: Payer: BC Managed Care – PPO | Admitting: Lab

## 2012-05-31 ENCOUNTER — Telehealth: Payer: Self-pay | Admitting: *Deleted

## 2012-05-31 NOTE — Telephone Encounter (Signed)
Told Ms. Korpela that Dr. Darrold Span said to keep her appt. for the neupogen injection on 06-04-12 as scheduled even though she did not receive a treatment 05-31-12 due to inclement weather.  Pt. verbalized understanding.

## 2012-05-31 NOTE — Telephone Encounter (Signed)
Patient's husband called and stated that they will try to make appt. They will call back if they cant make it.  [name]

## 2012-06-03 ENCOUNTER — Telehealth: Payer: Self-pay | Admitting: Oncology

## 2012-06-04 ENCOUNTER — Ambulatory Visit (HOSPITAL_BASED_OUTPATIENT_CLINIC_OR_DEPARTMENT_OTHER): Payer: BC Managed Care – PPO

## 2012-06-04 VITALS — BP 112/65 | HR 96 | Temp 98.2°F

## 2012-06-04 DIAGNOSIS — C569 Malignant neoplasm of unspecified ovary: Secondary | ICD-10-CM

## 2012-06-04 DIAGNOSIS — Z5189 Encounter for other specified aftercare: Secondary | ICD-10-CM

## 2012-06-04 MED ORDER — FILGRASTIM 300 MCG/0.5ML IJ SOLN
300.0000 ug | INTRAMUSCULAR | Status: DC
  Administered 2012-06-04: 300 ug via SUBCUTANEOUS
  Filled 2012-06-04: qty 0.5

## 2012-06-05 ENCOUNTER — Encounter: Payer: Self-pay | Admitting: Oncology

## 2012-06-05 NOTE — Progress Notes (Signed)
Faxed fmla form to [employer] @ [phone] °

## 2012-06-05 NOTE — Progress Notes (Signed)
Put fmla form on nurse's desk °

## 2012-06-07 ENCOUNTER — Ambulatory Visit (HOSPITAL_BASED_OUTPATIENT_CLINIC_OR_DEPARTMENT_OTHER): Payer: BC Managed Care – PPO

## 2012-06-07 ENCOUNTER — Other Ambulatory Visit (HOSPITAL_BASED_OUTPATIENT_CLINIC_OR_DEPARTMENT_OTHER): Payer: BC Managed Care – PPO | Admitting: Lab

## 2012-06-07 VITALS — BP 120/60 | HR 83 | Temp 97.2°F

## 2012-06-07 DIAGNOSIS — C561 Malignant neoplasm of right ovary: Secondary | ICD-10-CM

## 2012-06-07 DIAGNOSIS — C569 Malignant neoplasm of unspecified ovary: Secondary | ICD-10-CM

## 2012-06-07 DIAGNOSIS — Z5111 Encounter for antineoplastic chemotherapy: Secondary | ICD-10-CM

## 2012-06-07 LAB — CBC WITH DIFFERENTIAL/PLATELET
BASO%: 0.1 % (ref 0.0–2.0)
Basophils Absolute: 0 10*3/uL (ref 0.0–0.1)
EOS%: 0.1 % (ref 0.0–7.0)
HGB: 11 g/dL — ABNORMAL LOW (ref 11.6–15.9)
MCH: 29.8 pg (ref 25.1–34.0)
RDW: 17.8 % — ABNORMAL HIGH (ref 11.2–14.5)
WBC: 7.6 10*3/uL (ref 3.9–10.3)
lymph#: 0.6 10*3/uL — ABNORMAL LOW (ref 0.9–3.3)

## 2012-06-07 MED ORDER — ONDANSETRON 8 MG/50ML IVPB (CHCC)
8.0000 mg | Freq: Once | INTRAVENOUS | Status: AC
  Administered 2012-06-07: 8 mg via INTRAVENOUS

## 2012-06-07 MED ORDER — SODIUM CHLORIDE 0.9 % IV SOLN
Freq: Once | INTRAVENOUS | Status: AC
  Administered 2012-06-07: 13:00:00 via INTRAVENOUS

## 2012-06-07 MED ORDER — DEXAMETHASONE SODIUM PHOSPHATE 4 MG/ML IJ SOLN
20.0000 mg | Freq: Once | INTRAMUSCULAR | Status: AC
  Administered 2012-06-07: 20 mg via INTRAVENOUS

## 2012-06-07 MED ORDER — DIPHENHYDRAMINE HCL 50 MG/ML IJ SOLN
50.0000 mg | Freq: Once | INTRAMUSCULAR | Status: AC
  Administered 2012-06-07: 50 mg via INTRAVENOUS

## 2012-06-07 MED ORDER — PACLITAXEL CHEMO INJECTION 300 MG/50ML
80.0000 mg/m2 | Freq: Once | INTRAVENOUS | Status: AC
  Administered 2012-06-07: 138 mg via INTRAVENOUS
  Filled 2012-06-07: qty 23

## 2012-06-07 MED ORDER — FAMOTIDINE IN NACL 20-0.9 MG/50ML-% IV SOLN
20.0000 mg | Freq: Once | INTRAVENOUS | Status: AC
  Administered 2012-06-07: 20 mg via INTRAVENOUS

## 2012-06-07 NOTE — Patient Instructions (Addendum)
[facility] Discharge Instructions for Patients Receiving Chemotherapy  Today you received the following chemotherapy agents; Taxol.   To help prevent nausea and vomiting after your treatment, we encourage you to take your nausea medication; Zofran (ondansetron) and Ativan (lorazepam) as directed.   If you develop nausea and vomiting that is not controlled by your nausea medication, call the clinic. If it is after clinic hours your family physician or the after hours number for the clinic or go to the Emergency Department.   BELOW ARE SYMPTOMS THAT SHOULD BE REPORTED IMMEDIATELY:  *FEVER GREATER THAN 100.5 F  *CHILLS WITH OR WITHOUT FEVER  NAUSEA AND VOMITING THAT IS NOT CONTROLLED WITH YOUR NAUSEA MEDICATION  *UNUSUAL SHORTNESS OF BREATH  *UNUSUAL BRUISING OR BLEEDING  TENDERNESS IN MOUTH AND THROAT WITH OR WITHOUT PRESENCE OF ULCERS  *URINARY PROBLEMS  *BOWEL PROBLEMS  UNUSUAL RASH Items with * indicate a potential emergency and should be followed up as soon as possible.  Feel free to call the clinic you have any questions or concerns. The clinic phone number is [phone].   I have been informed and understand all the instructions given to me. I know to contact the clinic, my physician, or go to the Emergency Department if any problems should occur. I do not have any questions at this time, but understand that I may call the clinic during office hours   should I have any questions or need assistance in obtaining follow up care.    __________________________________________  _____________  __________ Signature of Patient or Authorized Representative            Date                   Time    __________________________________________ Nurse's Signature    

## 2012-06-10 ENCOUNTER — Other Ambulatory Visit: Payer: Self-pay | Admitting: Oncology

## 2012-06-11 ENCOUNTER — Ambulatory Visit (HOSPITAL_BASED_OUTPATIENT_CLINIC_OR_DEPARTMENT_OTHER): Payer: BC Managed Care – PPO | Admitting: Oncology

## 2012-06-11 ENCOUNTER — Other Ambulatory Visit (HOSPITAL_BASED_OUTPATIENT_CLINIC_OR_DEPARTMENT_OTHER): Payer: BC Managed Care – PPO | Admitting: Lab

## 2012-06-11 ENCOUNTER — Encounter: Payer: Self-pay | Admitting: Oncology

## 2012-06-11 ENCOUNTER — Ambulatory Visit (HOSPITAL_BASED_OUTPATIENT_CLINIC_OR_DEPARTMENT_OTHER): Payer: BC Managed Care – PPO

## 2012-06-11 ENCOUNTER — Telehealth: Payer: Self-pay | Admitting: Oncology

## 2012-06-11 VITALS — BP 111/56 | HR 94 | Temp 97.1°F | Resp 18 | Ht 67.5 in | Wt 144.5 lb

## 2012-06-11 DIAGNOSIS — C561 Malignant neoplasm of right ovary: Secondary | ICD-10-CM

## 2012-06-11 DIAGNOSIS — Z5189 Encounter for other specified aftercare: Secondary | ICD-10-CM

## 2012-06-11 DIAGNOSIS — G609 Hereditary and idiopathic neuropathy, unspecified: Secondary | ICD-10-CM

## 2012-06-11 DIAGNOSIS — C569 Malignant neoplasm of unspecified ovary: Secondary | ICD-10-CM

## 2012-06-11 LAB — CBC WITH DIFFERENTIAL/PLATELET
Eosinophils Absolute: 0 10*3/uL (ref 0.0–0.5)
MONO#: 0.1 10*3/uL (ref 0.1–0.9)
NEUT#: 1.9 10*3/uL (ref 1.5–6.5)
RBC: 3.74 10*6/uL (ref 3.70–5.45)
RDW: 18.9 % — ABNORMAL HIGH (ref 11.2–14.5)
WBC: 3.4 10*3/uL — ABNORMAL LOW (ref 3.9–10.3)

## 2012-06-11 LAB — COMPREHENSIVE METABOLIC PANEL (CC13)
AST: 13 U/L (ref 5–34)
Albumin: 3.7 g/dL (ref 3.5–5.0)
Alkaline Phosphatase: 87 U/L (ref 40–150)
BUN: 15.7 mg/dL (ref 7.0–26.0)
Creatinine: 0.8 mg/dL (ref 0.6–1.1)
Glucose: 100 mg/dl — ABNORMAL HIGH (ref 70–99)
Total Bilirubin: 0.42 mg/dL (ref 0.20–1.20)

## 2012-06-11 LAB — CA 125: CA 125: 53.2 U/mL — ABNORMAL HIGH (ref 0.0–30.2)

## 2012-06-11 MED ORDER — FILGRASTIM 300 MCG/0.5ML IJ SOLN
300.0000 ug | INTRAMUSCULAR | Status: DC
  Administered 2012-06-11: 300 ug via SUBCUTANEOUS
  Filled 2012-06-11: qty 0.5

## 2012-06-11 NOTE — Patient Instructions (Addendum)
Filgrastim, G-CSF injection What is this medicine? FILGRASTIM, G-CSF (fil GRA stim) stimulates the formation of white blood cells. This medicine is given to patients with conditions that may cause a decrease in white blood cells, like those receiving certain types of chemotherapy or bone marrow transplant. It helps the bone marrow recover its ability to produce white blood cells. Increasing the amount of white blood cells helps to decrease the risk of infection and fever. This medicine may be used for other purposes; ask your health care provider or pharmacist if you have questions. What should I tell my health care provider before I take this medicine? They need to know if you have any of these conditions: -currently receiving radiation therapy -sickle cell disease -an unusual or allergic reaction to filgrastim, E. coli protein, other medicines, foods, dyes, or preservatives -pregnant or trying to get pregnant -breast-feeding How should I use this medicine? This medicine is for injection into a vein or injection under the skin. It is usually given by a health care professional in a hospital or clinic setting. If you get this medicine at home, you will be taught how to prepare and give this medicine. Always change the site for the injection under the skin. Let the solution warm to room temperature before you use it. Do not shake the solution before you withdraw a dose. Throw away any unused portion. Use exactly as directed. Take your medicine at regular intervals. Do not take your medicine more often than directed. It is important that you put your used needles and syringes in a special sharps container. Do not put them in a trash can. If you do not have a sharps container, call your pharmacist or healthcare provider to get one. Talk to your pediatrician regarding the use of this medicine in children. While this medicine may be prescribed for children for selected conditions, precautions do  apply. Overdosage: If you think you have taken too much of this medicine contact a poison control center or emergency room at once. NOTE: This medicine is only for you. Do not share this medicine with others. What if I miss a dose? Try not to miss doses. If you miss a dose take the dose as soon as you remember. If it is almost time for the next dose, do not take double doses unless told to by your doctor or health care professional. What may interact with this medicine? -lithium -medicines for cancer chemotherapy This list may not describe all possible interactions. Give your health care provider a list of all the medicines, herbs, non-prescription drugs, or dietary supplements you use. Also tell them if you smoke, drink alcohol, or use illegal drugs. Some items may interact with your medicine. What should I watch for while using this medicine? Visit your doctor or health care professional for regular checks on your progress. If you get a fever or any sign of infection while you are using this medicine, do not treat yourself. Check with your doctor or health care professional. Bone pain can usually be relieved by mild pain relievers such as acetaminophen or ibuprofen. Check with your doctor or health care professional before taking these medicines as they may hide a fever. Call your doctor or health care professional if the aches and pains are severe or do not go away. What side effects may I notice from receiving this medicine? Side effects that you should report to your doctor or health care professional as soon as possible: -allergic reactions like skin rash, itching   or hives, swelling of the face, lips, or tongue -difficulty breathing, wheezing -fever -pain, redness, or swelling at the injection site -stomach or side pain, or pain at the shoulder Side effects that usually do not require medical attention (report to your doctor or health care professional if they continue or are  bothersome): -bone pain (ribs, lower back, breast bone) -headache -skin rash This list may not describe all possible side effects. Call your doctor for medical advice about side effects. You may report side effects to FDA at 1-800-FDA-1088. Where should I keep my medicine? Keep out of the reach of children. Store in a refrigerator between 2 and 8 degrees C (36 and 46 degrees F). Do not freeze or leave in direct sunlight. If vials or syringes are left out of the refrigerator for more than 24 hours, they must be thrown away. Throw away unused vials after the expiration date on the carton. NOTE: This sheet is a summary. It may not cover all possible information. If you have questions about this medicine, talk to your doctor, pharmacist, or health care provider.  2013, [employer]. (05/29/2007 1:33:21 PM)  

## 2012-06-11 NOTE — Progress Notes (Signed)
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 She had day 1 cycle 2 on 05-24-12, then day 8 cycle 2 delayed from 3-7 to 06-07-12 due to weather, and is due day 15 cycle 2 on 06-14-12. We have added neupogen on Tuesdays (her day off), with ANC holding well now. She is tolerating chemotherapy generally well, is back at work nearly full time, and has not had recurrent ascites or pleural effusions.  Patient had last gyn exam spring 2013 in [place]. She felt in usual excellent health thru Sept 2013,very athletic including 50 mile biking at [employer] in Sept, also swimming and hiking. In Oct 2013 she was more fatigued, with some fecal urge incontinence and abdominal fullness such that she began to diet to lose weight. By Fabienne Bruns she could not climb one flight of stairs and could walk only from office to car without SOB and coughing. She was seen at urgent care in Dec with bilateral pleural effusions on CXR, referred to Dr Francella Solian, with 1.6 liter right thoracentesis done 03-11-12 (no path in this EMR). She had CT CAP and CT angio chest with large bilateral pleural effusions, no PE, ascites and pelvic mass (see below). She was admitted 1-6 thru 04-06-12, with US paracentesis for 1.4 liters on 04-02-12 and left thoracentesis for 2 liters on 04-05-12. CA 125 was 2418. She was seen in consultation in hospital by Dr Nelly Rout on 04-02-12, then readmitted for surgery by gyn oncology on 04-09-12. The procedure by Dr Nelly Rout was exploratory laparotomy with TAH/BSO, radical debulking and omentectomy, which was optimal debulking with only residual at completion of procedure being 7 mm area on posterior surface of right lobe liver. She was transfused 2 units PRBCs on 04-10-12 for Hgb down to 7.2 (from  12 preop). Post operative course otherwise was not remarkable and she was DC home on 04-13-12. Pathology [id number]) high grade serous carcinoma involving right pelvic mass, serosa of uterus and peritoneum, omentum negative, no nodes submitted. Adjuvant therapy with dose dense taxol/ carboplatin began 05-03-12.  She has had no nausea, and appetite is good. Bowels are moving adequately. She is able to take full deep breaths with no chest discomfort. She notices some tingling in balls of feet moreso than hands, minimal and we have discussed shoes, massage etc. Feet are a little sore at times, but no frank taxol aches. Scalp is sore with hair thinning. She feels well on day of Rx (Fri) and Sat, then is very fatigued by Sun pm. She feels well again by at least Tues. She has had no fever or symptoms of infection and no aches with neupogen. Peripheral IV access has been adequate. Remainder of 10 point Review of Systems negative.  Objective:  Vital signs in last 24 hours:  BP 111/56  Pulse 94  Temp(Src) 97.1 F (36.2 C) (Oral)  Resp 18  Ht 5' 7.5" (1.715 m)  Wt 144 lb 8 oz (65.545 kg)  BMI 22.28 kg/m2  Weight is up ~ 5 lbs. Easily ambulatory, very cheerful and pleasant, looks comfortable. Hair is thinning.  HEENT:PERRLA, sclera clear, anicteric and oropharynx clear, no lesions LymphaticsCervical, supraclavicular, and axillary nodes normal. No inguinal adenopathy Resp: clear to auscultation bilaterally and normal percussion bilaterally to bases Cardio: regular rate and rhythm GI: soft, non-tender; bowel sounds  normal; no masses,  no organomegaly, not distended, surgical incision well healed. Extremities: extremities normal, atraumatic, no cyanosis or edema Neuro:no sensory deficits noted Skin without rash or ecchymoses, no significant problems at IV sites.  Lab Results:  Results for orders placed in visit on 06/11/12  CBC WITH DIFFERENTIAL      Result Value Range   WBC 3.4 (*) 3.9 - 10.3  10e3/uL   NEUT# 1.9  1.5 - 6.5 10e3/uL   HGB 11.7  11.6 - 15.9 g/dL   HCT 03.4 (*) 74.2 - 59.5 %   Platelets 189  145 - 400 10e3/uL   MCV 89.7  79.5 - 101.0 fL   MCH 31.3  25.1 - 34.0 pg   MCHC 34.9  31.5 - 36.0 g/dL   RBC 6.38  7.56 - 4.33 10e6/uL   RDW 18.9 (*) 11.2 - 14.5 %   lymph# 1.3  0.9 - 3.3 10e3/uL   MONO# 0.1  0.1 - 0.9 10e3/uL   Eosinophils Absolute 0.0  0.0 - 0.5 10e3/uL   Basophils Absolute 0.0  0.0 - 0.1 10e3/uL   NEUT% 56.4  38.4 - 76.8 %   LYMPH% 39.3  14.0 - 49.7 %   MONO% 3.4  0.0 - 14.0 %   EOS% 0.5  0.0 - 7.0 %   BASO% 0.4  0.0 - 2.0 %  COMPREHENSIVE METABOLIC PANEL (CC13)      Result Value Range   Sodium 137  136 - 145 mEq/L   Potassium 5.0  3.5 - 5.1 mEq/L   Chloride 104  98 - 107 mEq/L   CO2 28  22 - 29 mEq/L   Glucose 100 (*) 70 - 99 mg/dl   BUN 29.5  7.0 - 18.8 mg/dL   Creatinine 0.8  0.6 - 1.1 mg/dL   Total Bilirubin 4.16  0.20 - 1.20 mg/dL   Alkaline Phosphatase 87  40 - 150 U/L   AST 13  5 - 34 U/L   ALT 19  0 - 55 U/L   Total Protein 7.2  6.4 - 8.3 g/dL   Albumin 3.7  3.5 - 5.0 g/dL   Calcium 9.4  8.4 - 60.6 mg/dL    TK160 available after visit 53, having been 104 on 2-25 and 485 in early Feb; this was 2418 preop.  Studies/Results:  No results found.  Medications: I have reviewed the patient's current medications. No changes.  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 Continue same, including neupogen once weekly.  2.slight preexisting peripheral neuropathy fingertips right which we will follow on the taxol  3.post operative anemia, improved  4.overdue mammograms which we will arrange probably after this intensive treatment is completed. Post breast augmentations.  5.no colonoscopy, which she would also like done after chemotherapy  6. Flu vaccine given  Patient was comfortable with  discussion and plan.    Shelley Sanchez P, MD   06/11/2012, 7:52 PM

## 2012-06-11 NOTE — Telephone Encounter (Signed)
gv and printed appt schedule to pt for march and april....MB added tx

## 2012-06-14 ENCOUNTER — Ambulatory Visit (HOSPITAL_BASED_OUTPATIENT_CLINIC_OR_DEPARTMENT_OTHER): Payer: BC Managed Care – PPO

## 2012-06-14 ENCOUNTER — Other Ambulatory Visit (HOSPITAL_BASED_OUTPATIENT_CLINIC_OR_DEPARTMENT_OTHER): Payer: BC Managed Care – PPO | Admitting: Lab

## 2012-06-14 ENCOUNTER — Telehealth: Payer: Self-pay

## 2012-06-14 VITALS — BP 104/69 | HR 92 | Temp 97.7°F | Resp 19

## 2012-06-14 DIAGNOSIS — C569 Malignant neoplasm of unspecified ovary: Secondary | ICD-10-CM

## 2012-06-14 DIAGNOSIS — Z5111 Encounter for antineoplastic chemotherapy: Secondary | ICD-10-CM

## 2012-06-14 DIAGNOSIS — C561 Malignant neoplasm of right ovary: Secondary | ICD-10-CM

## 2012-06-14 LAB — CBC WITH DIFFERENTIAL/PLATELET
Basophils Absolute: 0 10*3/uL (ref 0.0–0.1)
Eosinophils Absolute: 0 10*3/uL (ref 0.0–0.5)
HCT: 32.6 % — ABNORMAL LOW (ref 34.8–46.6)
HGB: 10.9 g/dL — ABNORMAL LOW (ref 11.6–15.9)
MCV: 90.8 fL (ref 79.5–101.0)
MONO%: 1.1 % (ref 0.0–14.0)
NEUT#: 10.9 10*3/uL — ABNORMAL HIGH (ref 1.5–6.5)
NEUT%: 92.3 % — ABNORMAL HIGH (ref 38.4–76.8)
Platelets: 209 10*3/uL (ref 145–400)
RDW: 17.8 % — ABNORMAL HIGH (ref 11.2–14.5)

## 2012-06-14 MED ORDER — SODIUM CHLORIDE 0.9 % IV SOLN
80.0000 mg/m2 | Freq: Once | INTRAVENOUS | Status: AC
  Administered 2012-06-14: 138 mg via INTRAVENOUS
  Filled 2012-06-14: qty 23

## 2012-06-14 MED ORDER — SODIUM CHLORIDE 0.9 % IV SOLN
Freq: Once | INTRAVENOUS | Status: AC
  Administered 2012-06-14: 13:00:00 via INTRAVENOUS

## 2012-06-14 MED ORDER — DEXAMETHASONE SODIUM PHOSPHATE 4 MG/ML IJ SOLN
20.0000 mg | Freq: Once | INTRAMUSCULAR | Status: AC
  Administered 2012-06-14: 20 mg via INTRAVENOUS

## 2012-06-14 MED ORDER — ONDANSETRON 8 MG/50ML IVPB (CHCC)
8.0000 mg | Freq: Once | INTRAVENOUS | Status: AC
  Administered 2012-06-14: 8 mg via INTRAVENOUS

## 2012-06-14 MED ORDER — DIPHENHYDRAMINE HCL 50 MG/ML IJ SOLN
50.0000 mg | Freq: Once | INTRAMUSCULAR | Status: AC
  Administered 2012-06-14: 50 mg via INTRAVENOUS

## 2012-06-14 MED ORDER — FAMOTIDINE IN NACL 20-0.9 MG/50ML-% IV SOLN
20.0000 mg | Freq: Once | INTRAVENOUS | Status: AC
  Administered 2012-06-14: 20 mg via INTRAVENOUS

## 2012-06-14 NOTE — Telephone Encounter (Signed)
Message copied by Lorine Bears on Fri Jun 14, 2012  6:38 PM ------      Message from: Jama Flavors P      Created: Wed Jun 12, 2012  2:43 PM       Labs seen and need follow up: please let her know the ca125 marker is down to 53, which is a good drop. Continue treatment as planned. ------

## 2012-06-14 NOTE — Telephone Encounter (Signed)
Told Shelley Sanchez results of ca-125 as noted below by Dr. Darrold Span.

## 2012-06-14 NOTE — Patient Instructions (Addendum)
[facility] Discharge Instructions for Patients Receiving Chemotherapy  Today you received the following chemotherapy agents taxol  To help prevent nausea and vomiting after your treatment, we encourage you to take your nausea medication  and take it as often as prescribed   If you develop nausea and vomiting that is not controlled by your nausea medication, call the clinic. If it is after clinic hours your family physician or the after hours number for the clinic or go to the Emergency Department.   BELOW ARE SYMPTOMS THAT SHOULD BE REPORTED IMMEDIATELY:  *FEVER GREATER THAN 100.5 F  *CHILLS WITH OR WITHOUT FEVER  NAUSEA AND VOMITING THAT IS NOT CONTROLLED WITH YOUR NAUSEA MEDICATION  *UNUSUAL SHORTNESS OF BREATH  *UNUSUAL BRUISING OR BLEEDING  TENDERNESS IN MOUTH AND THROAT WITH OR WITHOUT PRESENCE OF ULCERS  *URINARY PROBLEMS  *BOWEL PROBLEMS  UNUSUAL RASH Items with * indicate a potential emergency and should be followed up as soon as possible.  One of the nurses will contact you 24 hours after your treatment. Please let the nurse know about any problems that you may have experienced. Feel free to call the clinic you have any questions or concerns. The clinic phone number is [phone].   I have been informed and understand all the instructions given to me. I know to contact the clinic, my physician, or go to the Emergency Department if any problems should occur. I do not have any questions at this time, but understand that I may call the clinic during office hours   should I have any questions or need assistance in obtaining follow up care.    __________________________________________  _____________  __________ Signature of Patient or Authorized Representative            Date                   Time    __________________________________________ Nurse's Signature    

## 2012-06-17 ENCOUNTER — Telehealth: Payer: Self-pay | Admitting: Oncology

## 2012-06-17 ENCOUNTER — Ambulatory Visit (HOSPITAL_BASED_OUTPATIENT_CLINIC_OR_DEPARTMENT_OTHER): Payer: BC Managed Care – PPO

## 2012-06-17 VITALS — BP 116/72 | HR 94 | Temp 97.9°F

## 2012-06-17 DIAGNOSIS — Z5189 Encounter for other specified aftercare: Secondary | ICD-10-CM

## 2012-06-17 DIAGNOSIS — C569 Malignant neoplasm of unspecified ovary: Secondary | ICD-10-CM

## 2012-06-17 DIAGNOSIS — C561 Malignant neoplasm of right ovary: Secondary | ICD-10-CM

## 2012-06-17 MED ORDER — FILGRASTIM 300 MCG/0.5ML IJ SOLN
300.0000 ug | Freq: Once | INTRAMUSCULAR | Status: AC
  Administered 2012-06-17: 300 ug via SUBCUTANEOUS
  Filled 2012-06-17: qty 0.5

## 2012-06-18 ENCOUNTER — Other Ambulatory Visit: Payer: Self-pay | Admitting: Oncology

## 2012-06-18 ENCOUNTER — Ambulatory Visit: Payer: BC Managed Care – PPO

## 2012-06-21 ENCOUNTER — Ambulatory Visit (HOSPITAL_BASED_OUTPATIENT_CLINIC_OR_DEPARTMENT_OTHER): Payer: BC Managed Care – PPO

## 2012-06-21 ENCOUNTER — Telehealth: Payer: Self-pay | Admitting: Internal Medicine

## 2012-06-21 ENCOUNTER — Other Ambulatory Visit (HOSPITAL_BASED_OUTPATIENT_CLINIC_OR_DEPARTMENT_OTHER): Payer: BC Managed Care – PPO | Admitting: Lab

## 2012-06-21 ENCOUNTER — Encounter: Payer: Self-pay | Admitting: *Deleted

## 2012-06-21 VITALS — BP 117/69 | HR 93 | Temp 97.9°F | Resp 19

## 2012-06-21 DIAGNOSIS — C561 Malignant neoplasm of right ovary: Secondary | ICD-10-CM

## 2012-06-21 DIAGNOSIS — Z5111 Encounter for antineoplastic chemotherapy: Secondary | ICD-10-CM

## 2012-06-21 DIAGNOSIS — C569 Malignant neoplasm of unspecified ovary: Secondary | ICD-10-CM

## 2012-06-21 LAB — CBC WITH DIFFERENTIAL/PLATELET
Basophils Absolute: 0 10*3/uL (ref 0.0–0.1)
Eosinophils Absolute: 0 10*3/uL (ref 0.0–0.5)
HCT: 33.3 % — ABNORMAL LOW (ref 34.8–46.6)
HGB: 11.3 g/dL — ABNORMAL LOW (ref 11.6–15.9)
MCV: 92.8 fL (ref 79.5–101.0)
NEUT#: 6.2 10*3/uL (ref 1.5–6.5)
RDW: 18.6 % — ABNORMAL HIGH (ref 11.2–14.5)
lymph#: 0.7 10*3/uL — ABNORMAL LOW (ref 0.9–3.3)

## 2012-06-21 MED ORDER — ONDANSETRON 16 MG/50ML IVPB (CHCC)
16.0000 mg | Freq: Once | INTRAVENOUS | Status: AC
  Administered 2012-06-21: 16 mg via INTRAVENOUS

## 2012-06-21 MED ORDER — PACLITAXEL CHEMO INJECTION 300 MG/50ML: 80.0000 mg/m2 | Freq: Once | INTRAVENOUS | Status: DC

## 2012-06-21 MED ORDER — DEXAMETHASONE SODIUM PHOSPHATE 4 MG/ML IJ SOLN
20.0000 mg | Freq: Once | INTRAMUSCULAR | Status: AC
  Administered 2012-06-21: 20 mg via INTRAVENOUS

## 2012-06-21 MED ORDER — FAMOTIDINE IN NACL 20-0.9 MG/50ML-% IV SOLN
20.0000 mg | Freq: Once | INTRAVENOUS | Status: AC
  Administered 2012-06-21: 20 mg via INTRAVENOUS

## 2012-06-21 MED ORDER — SODIUM CHLORIDE 0.9 % IV SOLN
595.2000 mg | Freq: Once | INTRAVENOUS | Status: AC
  Administered 2012-06-21: 600 mg via INTRAVENOUS
  Filled 2012-06-21: qty 60

## 2012-06-21 MED ORDER — SODIUM CHLORIDE 0.9 % IV SOLN
138.0000 mg | Freq: Once | INTRAVENOUS | Status: AC
  Administered 2012-06-21: 138 mg via INTRAVENOUS
  Filled 2012-06-21: qty 23

## 2012-06-21 MED ORDER — SODIUM CHLORIDE 0.9 % IV SOLN
Freq: Once | INTRAVENOUS | Status: AC
  Administered 2012-06-21: 13:00:00 via INTRAVENOUS

## 2012-06-21 MED ORDER — DIPHENHYDRAMINE HCL 50 MG/ML IJ SOLN
25.0000 mg | Freq: Once | INTRAMUSCULAR | Status: AC
  Administered 2012-06-21: 25 mg via INTRAVENOUS

## 2012-06-21 NOTE — Patient Instructions (Signed)
[facility] Discharge Instructions for Patients Receiving Chemotherapy  Today you received the following chemotherapy agents Taxol/Carboplatin To help prevent nausea and vomiting after your treatment, we encourage you to take your nausea medication as prescribed.  If you develop nausea and vomiting that is not controlled by your nausea medication, call the clinic. If it is after clinic hours your family physician or the after hours number for the clinic or go to the Emergency Department.   BELOW ARE SYMPTOMS THAT SHOULD BE REPORTED IMMEDIATELY:  *FEVER GREATER THAN 100.5 F  *CHILLS WITH OR WITHOUT FEVER  NAUSEA AND VOMITING THAT IS NOT CONTROLLED WITH YOUR NAUSEA MEDICATION  *UNUSUAL SHORTNESS OF BREATH  *UNUSUAL BRUISING OR BLEEDING  TENDERNESS IN MOUTH AND THROAT WITH OR WITHOUT PRESENCE OF ULCERS  *URINARY PROBLEMS  *BOWEL PROBLEMS  UNUSUAL RASH Items with * indicate a potential emergency and should be followed up as soon as possible.  One of the nurses will contact you 24 hours after your treatment. Please let the nurse know about any problems that you may have experienced. Feel free to call the clinic you have any questions or concerns. The clinic phone number is [phone].   I have been informed and understand all the instructions given to me. I know to contact the clinic, my physician, or go to the Emergency Department if any problems should occur. I do not have any questions at this time, but understand that I may call the clinic during office hours   should I have any questions or need assistance in obtaining follow up care.    __________________________________________  _____________  __________ Signature of Patient or Authorized Representative            Date                   Time    __________________________________________ Nurse's Signature    

## 2012-06-21 NOTE — Progress Notes (Signed)
Patient was to receive 50 mg IV benadryl as part of her pre medication. Patient does not have anyone with her to drive her home today. Benadryl decreased to 25 mg IV ok per Dr. Darrold Span.

## 2012-06-21 NOTE — Telephone Encounter (Signed)
I spoke with the pt and she states that she is needing a letter stating she was under Dr. Rolin Barry care from 03-28-12 through 04-08-12. She states she came in for an acute visit on 03-28-12 with Dr. Vassie Loll and he ordered a CT which was not done until 04-01-12 at which time the pt was admitted for pleural effusion and had a paracentesis. Pt was in the hospital until 04-08-12. Pt states the note needs to states she was admitted during this time period and unable to work. Please advise if ok to do letter. Pt will pick-up letter once completed. Carron Curie, CMA

## 2012-06-21 NOTE — Telephone Encounter (Signed)
Ok to write letter for whatever days she missed

## 2012-06-21 NOTE — Telephone Encounter (Signed)
Spoke with the pt and notified letter is ready for pick up  She verbalized understanding and states nothing further needed

## 2012-06-25 ENCOUNTER — Ambulatory Visit (HOSPITAL_BASED_OUTPATIENT_CLINIC_OR_DEPARTMENT_OTHER): Payer: BC Managed Care – PPO

## 2012-06-25 VITALS — BP 116/88 | HR 128 | Temp 97.7°F

## 2012-06-25 DIAGNOSIS — Z5189 Encounter for other specified aftercare: Secondary | ICD-10-CM

## 2012-06-25 DIAGNOSIS — C561 Malignant neoplasm of right ovary: Secondary | ICD-10-CM

## 2012-06-25 DIAGNOSIS — C569 Malignant neoplasm of unspecified ovary: Secondary | ICD-10-CM

## 2012-06-25 MED ORDER — FILGRASTIM 300 MCG/0.5ML IJ SOLN
300.0000 ug | Freq: Once | INTRAMUSCULAR | Status: AC
  Administered 2012-06-25: 300 ug via SUBCUTANEOUS
  Filled 2012-06-25: qty 0.5

## 2012-06-28 ENCOUNTER — Ambulatory Visit (HOSPITAL_BASED_OUTPATIENT_CLINIC_OR_DEPARTMENT_OTHER): Payer: BC Managed Care – PPO

## 2012-06-28 ENCOUNTER — Other Ambulatory Visit (HOSPITAL_BASED_OUTPATIENT_CLINIC_OR_DEPARTMENT_OTHER): Payer: BC Managed Care – PPO | Admitting: Lab

## 2012-06-28 DIAGNOSIS — Z5111 Encounter for antineoplastic chemotherapy: Secondary | ICD-10-CM

## 2012-06-28 DIAGNOSIS — C569 Malignant neoplasm of unspecified ovary: Secondary | ICD-10-CM

## 2012-06-28 DIAGNOSIS — C561 Malignant neoplasm of right ovary: Secondary | ICD-10-CM

## 2012-06-28 LAB — CBC WITH DIFFERENTIAL/PLATELET
BASO%: 0 % (ref 0.0–2.0)
EOS%: 0 % (ref 0.0–7.0)
MCH: 31.7 pg (ref 25.1–34.0)
MCHC: 34.1 g/dL (ref 31.5–36.0)
MCV: 93 fL (ref 79.5–101.0)
MONO%: 0.7 % (ref 0.0–14.0)
RBC: 3.28 10*6/uL — ABNORMAL LOW (ref 3.70–5.45)
RDW: 18.4 % — ABNORMAL HIGH (ref 11.2–14.5)
lymph#: 0.4 10*3/uL — ABNORMAL LOW (ref 0.9–3.3)

## 2012-06-28 MED ORDER — SODIUM CHLORIDE 0.9 % IV SOLN
Freq: Once | INTRAVENOUS | Status: AC
  Administered 2012-06-28: 13:00:00 via INTRAVENOUS

## 2012-06-28 MED ORDER — DEXAMETHASONE SODIUM PHOSPHATE 4 MG/ML IJ SOLN
20.0000 mg | Freq: Once | INTRAMUSCULAR | Status: AC
  Administered 2012-06-28: 20 mg via INTRAVENOUS

## 2012-06-28 MED ORDER — SODIUM CHLORIDE 0.9 % IV SOLN
80.0000 mg/m2 | Freq: Once | INTRAVENOUS | Status: AC
  Administered 2012-06-28: 138 mg via INTRAVENOUS
  Filled 2012-06-28: qty 23

## 2012-06-28 MED ORDER — DIPHENHYDRAMINE HCL 50 MG/ML IJ SOLN
50.0000 mg | Freq: Once | INTRAMUSCULAR | Status: AC
  Administered 2012-06-28: 50 mg via INTRAVENOUS

## 2012-06-28 MED ORDER — FAMOTIDINE IN NACL 20-0.9 MG/50ML-% IV SOLN
20.0000 mg | Freq: Once | INTRAVENOUS | Status: AC
  Administered 2012-06-28: 20 mg via INTRAVENOUS

## 2012-06-28 MED ORDER — ONDANSETRON 8 MG/50ML IVPB (CHCC)
8.0000 mg | Freq: Once | INTRAVENOUS | Status: AC
  Administered 2012-06-28: 8 mg via INTRAVENOUS

## 2012-06-28 NOTE — Patient Instructions (Signed)
[facility] Discharge Instructions for Patients Receiving Chemotherapy  Today you received the following chemotherapy agents Taxol  To help prevent nausea and vomiting after your treatment, we encourage you to take your nausea medication as directed.   If you develop nausea and vomiting that is not controlled by your nausea medication, call the clinic. If it is after clinic hours your family physician or the after hours number for the clinic or go to the Emergency Department.   BELOW ARE SYMPTOMS THAT SHOULD BE REPORTED IMMEDIATELY:  *FEVER GREATER THAN 100.5 F  *CHILLS WITH OR WITHOUT FEVER  NAUSEA AND VOMITING THAT IS NOT CONTROLLED WITH YOUR NAUSEA MEDICATION  *UNUSUAL SHORTNESS OF BREATH  *UNUSUAL BRUISING OR BLEEDING  TENDERNESS IN MOUTH AND THROAT WITH OR WITHOUT PRESENCE OF ULCERS  *URINARY PROBLEMS  *BOWEL PROBLEMS  UNUSUAL RASH Items with * indicate a potential emergency and should be followed up as soon as possible.  One of the nurses will contact you 24 hours after your treatment. Please let the nurse know about any problems that you may have experienced. Feel free to call the clinic you have any questions or concerns. The clinic phone number is [phone].   I have been informed and understand all the instructions given to me. I know to contact the clinic, my physician, or go to the Emergency Department if any problems should occur. I do not have any questions at this time, but understand that I may call the clinic during office hours   should I have any questions or need assistance in obtaining follow up care.    __________________________________________  _____________  __________ Signature of Patient or Authorized Representative            Date                   Time    __________________________________________ Nurse's Signature    

## 2012-06-30 ENCOUNTER — Other Ambulatory Visit: Payer: Self-pay | Admitting: Oncology

## 2012-06-30 DIAGNOSIS — C561 Malignant neoplasm of right ovary: Secondary | ICD-10-CM

## 2012-07-01 ENCOUNTER — Telehealth: Payer: Self-pay | Admitting: Oncology

## 2012-07-01 NOTE — Telephone Encounter (Signed)
, °

## 2012-07-02 ENCOUNTER — Other Ambulatory Visit (HOSPITAL_BASED_OUTPATIENT_CLINIC_OR_DEPARTMENT_OTHER): Payer: BC Managed Care – PPO | Admitting: Lab

## 2012-07-02 ENCOUNTER — Other Ambulatory Visit: Payer: Self-pay

## 2012-07-02 ENCOUNTER — Ambulatory Visit: Payer: BC Managed Care – PPO

## 2012-07-02 ENCOUNTER — Ambulatory Visit (HOSPITAL_BASED_OUTPATIENT_CLINIC_OR_DEPARTMENT_OTHER): Payer: BC Managed Care – PPO | Admitting: Oncology

## 2012-07-02 ENCOUNTER — Encounter: Payer: Self-pay | Admitting: Oncology

## 2012-07-02 VITALS — BP 105/64 | HR 111 | Temp 97.9°F | Resp 18 | Ht 67.5 in | Wt 148.7 lb

## 2012-07-02 DIAGNOSIS — Z5189 Encounter for other specified aftercare: Secondary | ICD-10-CM

## 2012-07-02 DIAGNOSIS — C569 Malignant neoplasm of unspecified ovary: Secondary | ICD-10-CM

## 2012-07-02 DIAGNOSIS — D649 Anemia, unspecified: Secondary | ICD-10-CM

## 2012-07-02 DIAGNOSIS — C561 Malignant neoplasm of right ovary: Secondary | ICD-10-CM

## 2012-07-02 DIAGNOSIS — G609 Hereditary and idiopathic neuropathy, unspecified: Secondary | ICD-10-CM

## 2012-07-02 LAB — CBC WITH DIFFERENTIAL/PLATELET
BASO%: 0 % (ref 0.0–2.0)
LYMPH%: 38.3 % (ref 14.0–49.7)
MCHC: 33.6 g/dL (ref 31.5–36.0)
MONO#: 0.2 10*3/uL (ref 0.1–0.9)
Platelets: 197 10*3/uL (ref 145–400)
RBC: 3.11 10*6/uL — ABNORMAL LOW (ref 3.70–5.45)
RDW: 18.6 % — ABNORMAL HIGH (ref 11.2–14.5)
WBC: 2.7 10*3/uL — ABNORMAL LOW (ref 3.9–10.3)
lymph#: 1 10*3/uL (ref 0.9–3.3)

## 2012-07-02 LAB — COMPREHENSIVE METABOLIC PANEL
ALT: 18 U/L (ref 0–35)
CO2: 30 mEq/L (ref 19–32)
Potassium: 4.3 mEq/L (ref 3.5–5.3)
Sodium: 138 mEq/L (ref 135–145)
Total Bilirubin: 0.4 mg/dL (ref 0.3–1.2)
Total Protein: 6.1 g/dL (ref 6.0–8.3)

## 2012-07-02 LAB — IRON AND TIBC: %SAT: 87 % — ABNORMAL HIGH (ref 20–55)

## 2012-07-02 LAB — CA 125: CA 125: 30.6 U/mL — ABNORMAL HIGH (ref 0.0–30.2)

## 2012-07-02 MED ORDER — FILGRASTIM 300 MCG/0.5ML IJ SOLN
300.0000 ug | Freq: Once | INTRAMUSCULAR | Status: AC
  Administered 2012-07-02: 300 ug via SUBCUTANEOUS
  Filled 2012-07-02: qty 0.5

## 2012-07-02 MED ORDER — DEXAMETHASONE 4 MG PO TABS: ORAL_TABLET | ORAL | Status: DC

## 2012-07-02 MED ORDER — FERROUS FUMARATE 325 (106 FE) MG PO TABS: 1.0000 | ORAL_TABLET | Freq: Every day | ORAL | Status: DC

## 2012-07-02 NOTE — Progress Notes (Signed)
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 She is due day 15 cycle 3 chemotherapy on 07-05-12. She has had no treatment delays or dose decreases since we added neupogen once weekly with the treatments (on Tuesdays, her day off from work). She is a little more fatigued with exertion, likely related to some progressive anemia with the chemotherapy, and has some intermittent low grade nausea which resolved when she eats.  Peripheral IV access has been more difficult, with 3 attempts by 3 nurses on 06-28-12. We have discussed PAC or PICC. With 9 weeks treatment left, PAC would seem most reasonable; if she is almost at end of treatment, PICC might be preferable (tho her work schedule might not allow flushes easily). Her preference would be to continue peripheral access if possible, and I have requested assistance from infusion nurses as possible with this.  Patient had last gyn exam spring 2013 in [place]. She felt in usual excellent health thru Sept 2013,very athletic including 50 mile biking at [employer] in Sept, also swimming and hiking. In Oct 2013 she was more fatigued, with some fecal urge incontinence and abdominal fullness such that she began to diet to lose weight. By Fabienne Bruns she could not climb one flight of stairs and could walk only from office to car without SOB and coughing. She was seen at urgent care in Dec with bilateral pleural effusions on CXR, referred to Dr Francella Solian, with 1.6 liter right thoracentesis done 03-11-12 (no path in this EMR). She had CT CAP and CT angio chest with large bilateral pleural effusions, no PE, ascites and pelvic mass (see below). She was admitted 1-6 thru 04-06-12, with US paracentesis for 1.4 liters on 04-02-12 and left thoracentesis for 2 liters  on 04-05-12. CA 125 was 2418. She was seen in consultation in hospital by Dr Nelly Rout on 04-02-12, then readmitted for surgery by gyn oncology on 04-09-12. The procedure by Dr Nelly Rout was exploratory laparotomy with TAH/BSO, radical debulking and omentectomy, which was optimal debulking with only residual at completion of procedure being 7 mm area on posterior surface of right lobe liver. She was transfused 2 units PRBCs on 04-10-12 for Hgb down to 7.2 (from 12 preop). Post operative course otherwise was not remarkable and she was DC home on 04-13-12. Pathology [phone]) high grade serous carcinoma involving right pelvic mass, serosa of uterus and peritoneum, omentum negative, no nodes submitted. Adjuvant therapy with dose dense taxol/ carboplatin began 05-03-12.  Patient is eating frequently with steroids and with the low grade nausea, and is not nearly as physically active now, with weight gain from this. Usual good weight has been 135 lbs. No GERD symptoms. No increased SOB. Energy fine to continue regular schedule at work. Bowels moving regularly. Minimal numbness in fingertips only, not bothersome to her. No bleeding. No fever or symptoms of infection. Remainder of 10 point Review of Systems negative.  Objective:  Vital signs in last 24 hours:  BP 105/64  Pulse 111  Temp(Src) 97.9 F (36.6 C) (Oral)  Resp 18  Ht 5' 7.5" (1.715 m)  Wt 148 lb 11.2 oz (67.45 kg)  BMI 22.93 kg/m2  Weight is up ~ 3 lbs. Easily ambulatory, looks comfortable. Respirations not labored RA. Not quite complete alopecia  HEENT:PERRLA, extra ocular movement intact, sclera  clear, anicteric and oropharynx clear, no lesions LymphaticsCervical, supraclavicular, and axillary nodes normal. Resp: clear to auscultation bilaterally and normal percussion bilaterally Cardio: regular rate and rhythm GI: soft, non-tender; bowel sounds normal; no masses,  no organomegaly Extremities: resolving ecchymoses dorsum of hands bilaterally  from IV attempts 06-28-12, no cords or erythema. No edema, cords, tenderness LE. Neuro:slight decreased light touch fingertips only Skin otherwise without rash or findings of concern   Lab Results:  Results for orders placed in visit on 07/02/12  CBC WITH DIFFERENTIAL      Result Value Range   WBC 2.7 (*) 3.9 - 10.3 10e3/uL   NEUT# 1.5  1.5 - 6.5 10e3/uL   HGB 9.8 (*) 11.6 - 15.9 g/dL   HCT 91.4 (*) 78.2 - 95.6 %   Platelets 197  145 - 400 10e3/uL   MCV 93.9  79.5 - 101.0 fL   MCH 31.5  25.1 - 34.0 pg   MCHC 33.6  31.5 - 36.0 g/dL   RBC 2.13 (*) 0.86 - 5.78 10e6/uL   RDW 18.6 (*) 11.2 - 14.5 %   lymph# 1.0  0.9 - 3.3 10e3/uL   MONO# 0.2  0.1 - 0.9 10e3/uL   Eosinophils Absolute 0.0  0.0 - 0.5 10e3/uL   Basophils Absolute 0.0  0.0 - 0.1 10e3/uL   NEUT% 55.3  38.4 - 76.8 %   LYMPH% 38.3  14.0 - 49.7 %   MONO% 6.0  0.0 - 14.0 %   EOS% 0.4  0.0 - 7.0 %   BASO% 0.0  0.0 - 2.0 %    CMET and CA 125 pending Iron/IBC added now  Studies/Results:  No results found.  Medications: I have reviewed the patient's current medications. Neupogen given today. Decadron prescription sent, enough for remainder of treatments. Will add hemocyte one daily on empty stomach with OJ.  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 Continue same, including neupogen once weekly. Add oral iron and follow hemoglobin. 2.slight preexisting peripheral neuropathy fingertips right which we will follow on the taxol  3. More difficult peripheral IV access, which patient still prefers to central line if possible 4.overdue mammograms which we will arrange probably after this intensive treatment is completed. Post breast augmentations.  5.no colonoscopy, which she would also like done after chemotherapy   Patient will need more discussion of central line if peripheral  access not adequate.   Reece Packer, MD   07/02/2012, 2:44 PM

## 2012-07-02 NOTE — Patient Instructions (Signed)
Start iron one tablet daily on empty stomach with OJ

## 2012-07-03 ENCOUNTER — Telehealth: Payer: Self-pay | Admitting: *Deleted

## 2012-07-03 NOTE — Telephone Encounter (Signed)
Message copied by Carola Rhine A on Wed Jul 03, 2012 12:05 PM ------      Message from: Shelley Sanchez      Created: Tue Jul 02, 2012 10:07 PM       Labs seen and need follow up: please let her know ca125 marker is down to 30 ------

## 2012-07-03 NOTE — Telephone Encounter (Signed)
Pt notified of ca 125 results

## 2012-07-03 NOTE — Telephone Encounter (Signed)
Left message to call.

## 2012-07-03 NOTE — Telephone Encounter (Signed)
Per staff message and POF I have scheduled appts.  [name]  

## 2012-07-05 ENCOUNTER — Ambulatory Visit (HOSPITAL_BASED_OUTPATIENT_CLINIC_OR_DEPARTMENT_OTHER): Payer: BC Managed Care – PPO

## 2012-07-05 ENCOUNTER — Other Ambulatory Visit (HOSPITAL_BASED_OUTPATIENT_CLINIC_OR_DEPARTMENT_OTHER): Payer: BC Managed Care – PPO | Admitting: Lab

## 2012-07-05 VITALS — BP 109/68 | HR 93 | Temp 97.7°F

## 2012-07-05 DIAGNOSIS — Z5111 Encounter for antineoplastic chemotherapy: Secondary | ICD-10-CM

## 2012-07-05 DIAGNOSIS — C569 Malignant neoplasm of unspecified ovary: Secondary | ICD-10-CM

## 2012-07-05 DIAGNOSIS — C561 Malignant neoplasm of right ovary: Secondary | ICD-10-CM

## 2012-07-05 LAB — CBC WITH DIFFERENTIAL/PLATELET
Basophils Absolute: 0.1 10*3/uL (ref 0.0–0.1)
HCT: 31.4 % — ABNORMAL LOW (ref 34.8–46.6)
HGB: 10.5 g/dL — ABNORMAL LOW (ref 11.6–15.9)
MONO#: 0.3 10*3/uL (ref 0.1–0.9)
NEUT%: 88.7 % — ABNORMAL HIGH (ref 38.4–76.8)
WBC: 11.2 10*3/uL — ABNORMAL HIGH (ref 3.9–10.3)
lymph#: 0.9 10*3/uL (ref 0.9–3.3)

## 2012-07-05 MED ORDER — DIPHENHYDRAMINE HCL 50 MG/ML IJ SOLN
50.0000 mg | Freq: Once | INTRAMUSCULAR | Status: AC
  Administered 2012-07-05: 50 mg via INTRAVENOUS

## 2012-07-05 MED ORDER — ONDANSETRON 8 MG/50ML IVPB (CHCC)
8.0000 mg | Freq: Once | INTRAVENOUS | Status: AC
  Administered 2012-07-05: 8 mg via INTRAVENOUS

## 2012-07-05 MED ORDER — DEXAMETHASONE SODIUM PHOSPHATE 4 MG/ML IJ SOLN
20.0000 mg | Freq: Once | INTRAMUSCULAR | Status: AC
  Administered 2012-07-05: 20 mg via INTRAVENOUS

## 2012-07-05 MED ORDER — FAMOTIDINE IN NACL 20-0.9 MG/50ML-% IV SOLN
20.0000 mg | Freq: Once | INTRAVENOUS | Status: AC
  Administered 2012-07-05: 20 mg via INTRAVENOUS

## 2012-07-05 MED ORDER — SODIUM CHLORIDE 0.9 % IV SOLN
Freq: Once | INTRAVENOUS | Status: AC
  Administered 2012-07-05: 13:00:00 via INTRAVENOUS

## 2012-07-05 MED ORDER — PACLITAXEL CHEMO INJECTION 300 MG/50ML
80.0000 mg/m2 | Freq: Once | INTRAVENOUS | Status: AC
  Administered 2012-07-05: 138 mg via INTRAVENOUS
  Filled 2012-07-05: qty 23

## 2012-07-05 NOTE — Patient Instructions (Addendum)
[facility] Discharge Instructions for Patients Receiving Chemotherapy  Today you received the following chemotherapy agents taxol.  To help prevent nausea and vomiting after your treatment, we encourage you to take your nausea medication zofran. Begin taking it tonight and take it as often as prescribed.   If you develop nausea and vomiting that is not controlled by your nausea medication, call the clinic. If it is after clinic hours your family physician or the after hours number for the clinic or go to the Emergency Department.   BELOW ARE SYMPTOMS THAT SHOULD BE REPORTED IMMEDIATELY:  *FEVER GREATER THAN 100.5 F  *CHILLS WITH OR WITHOUT FEVER  NAUSEA AND VOMITING THAT IS NOT CONTROLLED WITH YOUR NAUSEA MEDICATION  *UNUSUAL SHORTNESS OF BREATH  *UNUSUAL BRUISING OR BLEEDING  TENDERNESS IN MOUTH AND THROAT WITH OR WITHOUT PRESENCE OF ULCERS  *URINARY PROBLEMS  *BOWEL PROBLEMS  UNUSUAL RASH Items with * indicate a potential emergency and should be followed up as soon as possible.  . Feel free to call the clinic you have any questions or concerns. The clinic phone number is [phone].   I have been informed and understand all the instructions given to me. I know to contact the clinic, my physician, or go to the Emergency Department if any problems should occur. I do not have any questions at this time, but understand that I may call the clinic during office hours   should I have any questions or need assistance in obtaining follow up care.    __________________________________________  _____________  __________ Signature of Patient or Authorized Representative            Date                   Time    __________________________________________ Nurse's Signature

## 2012-07-09 ENCOUNTER — Ambulatory Visit (HOSPITAL_BASED_OUTPATIENT_CLINIC_OR_DEPARTMENT_OTHER): Payer: BC Managed Care – PPO

## 2012-07-09 VITALS — BP 96/42 | HR 89 | Temp 98.3°F

## 2012-07-09 DIAGNOSIS — C561 Malignant neoplasm of right ovary: Secondary | ICD-10-CM

## 2012-07-09 DIAGNOSIS — C569 Malignant neoplasm of unspecified ovary: Secondary | ICD-10-CM

## 2012-07-09 DIAGNOSIS — Z5189 Encounter for other specified aftercare: Secondary | ICD-10-CM

## 2012-07-09 MED ORDER — FILGRASTIM 300 MCG/0.5ML IJ SOLN
300.0000 ug | Freq: Once | INTRAMUSCULAR | Status: AC
  Administered 2012-07-09: 300 ug via SUBCUTANEOUS
  Filled 2012-07-09: qty 0.5

## 2012-07-12 ENCOUNTER — Ambulatory Visit (HOSPITAL_BASED_OUTPATIENT_CLINIC_OR_DEPARTMENT_OTHER): Payer: BC Managed Care – PPO

## 2012-07-12 ENCOUNTER — Other Ambulatory Visit (HOSPITAL_BASED_OUTPATIENT_CLINIC_OR_DEPARTMENT_OTHER): Payer: BC Managed Care – PPO | Admitting: Lab

## 2012-07-12 VITALS — BP 116/77 | HR 100 | Temp 97.7°F | Resp 18

## 2012-07-12 DIAGNOSIS — C569 Malignant neoplasm of unspecified ovary: Secondary | ICD-10-CM

## 2012-07-12 DIAGNOSIS — C561 Malignant neoplasm of right ovary: Secondary | ICD-10-CM

## 2012-07-12 DIAGNOSIS — Z5111 Encounter for antineoplastic chemotherapy: Secondary | ICD-10-CM

## 2012-07-12 LAB — CBC WITH DIFFERENTIAL/PLATELET
BASO%: 0.2 % (ref 0.0–2.0)
EOS%: 0.1 % (ref 0.0–7.0)
Eosinophils Absolute: 0 10*3/uL (ref 0.0–0.5)
MONO#: 0.1 10*3/uL (ref 0.1–0.9)
MONO%: 1.1 % (ref 0.0–14.0)
NEUT#: 11.4 10*3/uL — ABNORMAL HIGH (ref 1.5–6.5)
NEUT%: 92.7 % — ABNORMAL HIGH (ref 38.4–76.8)
Platelets: 163 10*3/uL (ref 145–400)
RBC: 3.33 10*6/uL — ABNORMAL LOW (ref 3.70–5.45)
RDW: 19.5 % — ABNORMAL HIGH (ref 11.2–14.5)
WBC: 12.3 10*3/uL — ABNORMAL HIGH (ref 3.9–10.3)
lymph#: 0.7 10*3/uL — ABNORMAL LOW (ref 0.9–3.3)

## 2012-07-12 MED ORDER — DEXAMETHASONE SODIUM PHOSPHATE 4 MG/ML IJ SOLN
20.0000 mg | Freq: Once | INTRAMUSCULAR | Status: AC
  Administered 2012-07-12: 20 mg via INTRAVENOUS

## 2012-07-12 MED ORDER — PACLITAXEL CHEMO INJECTION 300 MG/50ML: 80.0000 mg/m2 | Freq: Once | INTRAVENOUS | Status: DC

## 2012-07-12 MED ORDER — SODIUM CHLORIDE 0.9 % IV SOLN
Freq: Once | INTRAVENOUS | Status: AC
  Administered 2012-07-12: 13:00:00 via INTRAVENOUS

## 2012-07-12 MED ORDER — SODIUM CHLORIDE 0.9 % IV SOLN
80.0000 mg/m2 | Freq: Once | INTRAVENOUS | Status: AC
  Administered 2012-07-12: 138 mg via INTRAVENOUS
  Filled 2012-07-12: qty 23

## 2012-07-12 MED ORDER — ONDANSETRON 16 MG/50ML IVPB (CHCC)
16.0000 mg | Freq: Once | INTRAVENOUS | Status: AC
  Administered 2012-07-12: 16 mg via INTRAVENOUS

## 2012-07-12 MED ORDER — DIPHENHYDRAMINE HCL 50 MG/ML IJ SOLN
50.0000 mg | Freq: Once | INTRAMUSCULAR | Status: AC
  Administered 2012-07-12: 50 mg via INTRAVENOUS

## 2012-07-12 MED ORDER — FAMOTIDINE IN NACL 20-0.9 MG/50ML-% IV SOLN
20.0000 mg | Freq: Once | INTRAVENOUS | Status: AC
  Administered 2012-07-12: 20 mg via INTRAVENOUS

## 2012-07-12 MED ORDER — SODIUM CHLORIDE 0.9 % IV SOLN
589.0000 mg | Freq: Once | INTRAVENOUS | Status: AC
  Administered 2012-07-12: 590 mg via INTRAVENOUS
  Filled 2012-07-12: qty 59

## 2012-07-12 NOTE — Patient Instructions (Addendum)
[facility] Discharge Instructions for Patients Receiving Chemotherapy  Today you received the following chemotherapy agents: Taxol, Carboplatin.  To help prevent nausea and vomiting after your treatment, we encourage you to take your nausea medication.   If you develop nausea and vomiting that is not controlled by your nausea medication, call the clinic.   BELOW ARE SYMPTOMS THAT SHOULD BE REPORTED IMMEDIATELY:  *FEVER GREATER THAN 100.5 F  *CHILLS WITH OR WITHOUT FEVER  NAUSEA AND VOMITING THAT IS NOT CONTROLLED WITH YOUR NAUSEA MEDICATION  *UNUSUAL SHORTNESS OF BREATH  *UNUSUAL BRUISING OR BLEEDING  TENDERNESS IN MOUTH AND THROAT WITH OR WITHOUT PRESENCE OF ULCERS  *URINARY PROBLEMS  *BOWEL PROBLEMS  UNUSUAL RASH Items with * indicate a potential emergency and should be followed up as soon as possible.  Feel free to call the clinic you have any questions or concerns. The clinic phone number is [phone].    

## 2012-07-16 ENCOUNTER — Ambulatory Visit (HOSPITAL_BASED_OUTPATIENT_CLINIC_OR_DEPARTMENT_OTHER): Payer: BC Managed Care – PPO

## 2012-07-16 VITALS — BP 103/67 | HR 123 | Temp 98.3°F

## 2012-07-16 DIAGNOSIS — C569 Malignant neoplasm of unspecified ovary: Secondary | ICD-10-CM

## 2012-07-16 DIAGNOSIS — C561 Malignant neoplasm of right ovary: Secondary | ICD-10-CM

## 2012-07-16 DIAGNOSIS — Z5189 Encounter for other specified aftercare: Secondary | ICD-10-CM

## 2012-07-16 MED ORDER — FILGRASTIM 300 MCG/0.5ML IJ SOLN
300.0000 ug | Freq: Once | INTRAMUSCULAR | Status: AC
  Administered 2012-07-16: 300 ug via SUBCUTANEOUS
  Filled 2012-07-16: qty 0.5

## 2012-07-19 ENCOUNTER — Ambulatory Visit (HOSPITAL_BASED_OUTPATIENT_CLINIC_OR_DEPARTMENT_OTHER): Payer: BC Managed Care – PPO

## 2012-07-19 ENCOUNTER — Other Ambulatory Visit (HOSPITAL_BASED_OUTPATIENT_CLINIC_OR_DEPARTMENT_OTHER): Payer: BC Managed Care – PPO | Admitting: Lab

## 2012-07-19 VITALS — BP 121/64 | HR 112 | Temp 97.3°F | Resp 20

## 2012-07-19 DIAGNOSIS — C569 Malignant neoplasm of unspecified ovary: Secondary | ICD-10-CM

## 2012-07-19 DIAGNOSIS — Z5111 Encounter for antineoplastic chemotherapy: Secondary | ICD-10-CM

## 2012-07-19 DIAGNOSIS — C561 Malignant neoplasm of right ovary: Secondary | ICD-10-CM

## 2012-07-19 LAB — CBC WITH DIFFERENTIAL/PLATELET
BASO%: 0.1 % (ref 0.0–2.0)
EOS%: 0 % (ref 0.0–7.0)
LYMPH%: 7.2 % — ABNORMAL LOW (ref 14.0–49.7)
MCH: 32.3 pg (ref 25.1–34.0)
MCHC: 33.6 g/dL (ref 31.5–36.0)
MONO#: 0.1 10*3/uL (ref 0.1–0.9)
MONO%: 0.7 % (ref 0.0–14.0)
Platelets: 162 10*3/uL (ref 145–400)
RBC: 2.91 10*6/uL — ABNORMAL LOW (ref 3.70–5.45)
WBC: 7.1 10*3/uL (ref 3.9–10.3)
nRBC: 0 % (ref 0–0)

## 2012-07-19 MED ORDER — ONDANSETRON 8 MG/50ML IVPB (CHCC)
8.0000 mg | Freq: Once | INTRAVENOUS | Status: AC
  Administered 2012-07-19: 8 mg via INTRAVENOUS

## 2012-07-19 MED ORDER — SODIUM CHLORIDE 0.9 % IV SOLN
80.0000 mg/m2 | Freq: Once | INTRAVENOUS | Status: AC
  Administered 2012-07-19: 138 mg via INTRAVENOUS
  Filled 2012-07-19: qty 23

## 2012-07-19 MED ORDER — DIPHENHYDRAMINE HCL 50 MG/ML IJ SOLN
50.0000 mg | Freq: Once | INTRAMUSCULAR | Status: AC
  Administered 2012-07-19: 50 mg via INTRAVENOUS

## 2012-07-19 MED ORDER — DEXAMETHASONE SODIUM PHOSPHATE 4 MG/ML IJ SOLN
20.0000 mg | Freq: Once | INTRAMUSCULAR | Status: AC
  Administered 2012-07-19: 20 mg via INTRAVENOUS

## 2012-07-19 MED ORDER — SODIUM CHLORIDE 0.9 % IV SOLN
Freq: Once | INTRAVENOUS | Status: AC
  Administered 2012-07-19: 13:00:00 via INTRAVENOUS

## 2012-07-19 MED ORDER — FAMOTIDINE IN NACL 20-0.9 MG/50ML-% IV SOLN
20.0000 mg | Freq: Once | INTRAVENOUS | Status: AC
  Administered 2012-07-19: 20 mg via INTRAVENOUS

## 2012-07-19 NOTE — Patient Instructions (Addendum)
[facility] Discharge Instructions for Patients Receiving Chemotherapy  Today you received the following chemotherapy agents :  Taxol.  To help prevent nausea and vomiting after your treatment, we encourage you to take your nausea medication as instructed by your physician.    If you develop nausea and vomiting that is not controlled by your nausea medication, call the clinic. If it is after clinic hours your family physician or the after hours number for the clinic or go to the Emergency Department.   BELOW ARE SYMPTOMS THAT SHOULD BE REPORTED IMMEDIATELY:  *FEVER GREATER THAN 100.5 F  *CHILLS WITH OR WITHOUT FEVER  NAUSEA AND VOMITING THAT IS NOT CONTROLLED WITH YOUR NAUSEA MEDICATION  *UNUSUAL SHORTNESS OF BREATH  *UNUSUAL BRUISING OR BLEEDING  TENDERNESS IN MOUTH AND THROAT WITH OR WITHOUT PRESENCE OF ULCERS  *URINARY PROBLEMS  *BOWEL PROBLEMS  UNUSUAL RASH Items with * indicate a potential emergency and should be followed up as soon as possible.  One of the nurses will contact you 24 hours after your treatment. Please let the nurse know about any problems that you may have experienced. Feel free to call the clinic you have any questions or concerns. The clinic phone number is [phone].   I have been informed and understand all the instructions given to me. I know to contact the clinic, my physician, or go to the Emergency Department if any problems should occur. I do not have any questions at this time, but understand that I may call the clinic during office hours   should I have any questions or need assistance in obtaining follow up care.    __________________________________________  _____________  __________ Signature of Patient or Authorized Representative            Date                   Time    __________________________________________ Nurse's Signature    

## 2012-07-23 ENCOUNTER — Ambulatory Visit (HOSPITAL_BASED_OUTPATIENT_CLINIC_OR_DEPARTMENT_OTHER): Payer: BC Managed Care – PPO

## 2012-07-23 VITALS — BP 105/66 | HR 110 | Temp 97.8°F

## 2012-07-23 DIAGNOSIS — C561 Malignant neoplasm of right ovary: Secondary | ICD-10-CM

## 2012-07-23 DIAGNOSIS — Z5189 Encounter for other specified aftercare: Secondary | ICD-10-CM

## 2012-07-23 DIAGNOSIS — C569 Malignant neoplasm of unspecified ovary: Secondary | ICD-10-CM

## 2012-07-23 MED ORDER — FILGRASTIM 300 MCG/0.5ML IJ SOLN
300.0000 ug | Freq: Once | INTRAMUSCULAR | Status: AC
  Administered 2012-07-23: 300 ug via SUBCUTANEOUS
  Filled 2012-07-23: qty 0.5

## 2012-07-26 ENCOUNTER — Other Ambulatory Visit (HOSPITAL_BASED_OUTPATIENT_CLINIC_OR_DEPARTMENT_OTHER): Payer: BC Managed Care – PPO | Admitting: Lab

## 2012-07-26 ENCOUNTER — Ambulatory Visit (HOSPITAL_BASED_OUTPATIENT_CLINIC_OR_DEPARTMENT_OTHER): Payer: BC Managed Care – PPO

## 2012-07-26 DIAGNOSIS — C561 Malignant neoplasm of right ovary: Secondary | ICD-10-CM

## 2012-07-26 DIAGNOSIS — C569 Malignant neoplasm of unspecified ovary: Secondary | ICD-10-CM

## 2012-07-26 DIAGNOSIS — Z5111 Encounter for antineoplastic chemotherapy: Secondary | ICD-10-CM

## 2012-07-26 LAB — CBC WITH DIFFERENTIAL/PLATELET
BASO%: 0.5 % (ref 0.0–2.0)
EOS%: 0 % (ref 0.0–7.0)
HCT: 29.1 % — ABNORMAL LOW (ref 34.8–46.6)
LYMPH%: 9.4 % — ABNORMAL LOW (ref 14.0–49.7)
MCH: 32.9 pg (ref 25.1–34.0)
MCHC: 33 g/dL (ref 31.5–36.0)
NEUT%: 89.4 % — ABNORMAL HIGH (ref 38.4–76.8)
Platelets: 156 10*3/uL (ref 145–400)

## 2012-07-26 MED ORDER — SODIUM CHLORIDE 0.9 % IV SOLN
80.0000 mg/m2 | Freq: Once | INTRAVENOUS | Status: AC
  Administered 2012-07-26: 138 mg via INTRAVENOUS
  Filled 2012-07-26: qty 23

## 2012-07-26 MED ORDER — ONDANSETRON 8 MG/50ML IVPB (CHCC)
8.0000 mg | Freq: Once | INTRAVENOUS | Status: AC
  Administered 2012-07-26: 8 mg via INTRAVENOUS

## 2012-07-26 MED ORDER — FAMOTIDINE IN NACL 20-0.9 MG/50ML-% IV SOLN
20.0000 mg | Freq: Once | INTRAVENOUS | Status: AC
  Administered 2012-07-26: 20 mg via INTRAVENOUS

## 2012-07-26 MED ORDER — SODIUM CHLORIDE 0.9 % IV SOLN
Freq: Once | INTRAVENOUS | Status: AC
  Administered 2012-07-26: 13:00:00 via INTRAVENOUS

## 2012-07-26 MED ORDER — DEXAMETHASONE SODIUM PHOSPHATE 20 MG/5ML IJ SOLN
20.0000 mg | Freq: Once | INTRAMUSCULAR | Status: AC
  Administered 2012-07-26: 20 mg via INTRAVENOUS

## 2012-07-26 MED ORDER — DIPHENHYDRAMINE HCL 50 MG/ML IJ SOLN
50.0000 mg | Freq: Once | INTRAMUSCULAR | Status: AC
  Administered 2012-07-26: 50 mg via INTRAVENOUS

## 2012-07-26 NOTE — Patient Instructions (Addendum)
[facility] Discharge Instructions for Patients Receiving Chemotherapy  Today you received the following chemotherapy agents Taxol.  To help prevent nausea and vomiting after your treatment, we encourage you to take your nausea medication as directed.  If you develop nausea and vomiting that is not controlled by your nausea medication, call the clinic. If it is after clinic hours your family physician or the after hours number for the clinic or go to the Emergency Department.   BELOW ARE SYMPTOMS THAT SHOULD BE REPORTED IMMEDIATELY:  *FEVER GREATER THAN 100.5 F  *CHILLS WITH OR WITHOUT FEVER  NAUSEA AND VOMITING THAT IS NOT CONTROLLED WITH YOUR NAUSEA MEDICATION  *UNUSUAL SHORTNESS OF BREATH  *UNUSUAL BRUISING OR BLEEDING  TENDERNESS IN MOUTH AND THROAT WITH OR WITHOUT PRESENCE OF ULCERS  *URINARY PROBLEMS  *BOWEL PROBLEMS  UNUSUAL RASH Items with * indicate a potential emergency and should be followed up as soon as possible.  Feel free to call the clinic you have any questions or concerns. The clinic phone number is [phone].

## 2012-07-26 NOTE — Progress Notes (Signed)
Multiple IV sticks again today. Multiple old bruises noted from previous sticks last week. Discussed PICC placement with patient, she will think about this and talk to Dr Darrold Span at next appointment.

## 2012-07-30 ENCOUNTER — Ambulatory Visit (HOSPITAL_BASED_OUTPATIENT_CLINIC_OR_DEPARTMENT_OTHER): Payer: BC Managed Care – PPO | Admitting: Oncology

## 2012-07-30 ENCOUNTER — Encounter: Payer: Self-pay | Admitting: Oncology

## 2012-07-30 ENCOUNTER — Ambulatory Visit (HOSPITAL_BASED_OUTPATIENT_CLINIC_OR_DEPARTMENT_OTHER): Payer: BC Managed Care – PPO

## 2012-07-30 ENCOUNTER — Other Ambulatory Visit (HOSPITAL_BASED_OUTPATIENT_CLINIC_OR_DEPARTMENT_OTHER): Payer: BC Managed Care – PPO | Admitting: Lab

## 2012-07-30 ENCOUNTER — Telehealth: Payer: Self-pay | Admitting: Oncology

## 2012-07-30 VITALS — BP 131/72 | HR 102 | Temp 97.7°F | Resp 18 | Ht 67.5 in | Wt 157.1 lb

## 2012-07-30 DIAGNOSIS — C561 Malignant neoplasm of right ovary: Secondary | ICD-10-CM

## 2012-07-30 DIAGNOSIS — C569 Malignant neoplasm of unspecified ovary: Secondary | ICD-10-CM

## 2012-07-30 DIAGNOSIS — G609 Hereditary and idiopathic neuropathy, unspecified: Secondary | ICD-10-CM

## 2012-07-30 DIAGNOSIS — Z5189 Encounter for other specified aftercare: Secondary | ICD-10-CM

## 2012-07-30 LAB — CBC WITH DIFFERENTIAL/PLATELET
BASO%: 0.3 % (ref 0.0–2.0)
Basophils Absolute: 0 10*3/uL (ref 0.0–0.1)
EOS%: 0.5 % (ref 0.0–7.0)
HGB: 9.6 g/dL — ABNORMAL LOW (ref 11.6–15.9)
MCH: 34.1 pg — ABNORMAL HIGH (ref 25.1–34.0)
MCHC: 35 g/dL (ref 31.5–36.0)
MCV: 97.5 fL (ref 79.5–101.0)
MONO%: 4.5 % (ref 0.0–14.0)
RBC: 2.81 10*6/uL — ABNORMAL LOW (ref 3.70–5.45)
RDW: 21 % — ABNORMAL HIGH (ref 11.2–14.5)

## 2012-07-30 LAB — CA 125: CA 125: 30 U/mL (ref 0.0–30.2)

## 2012-07-30 LAB — COMPREHENSIVE METABOLIC PANEL (CC13)
ALT: 22 U/L (ref 0–55)
AST: 13 U/L (ref 5–34)
Albumin: 3.7 g/dL (ref 3.5–5.0)
Alkaline Phosphatase: 68 U/L (ref 40–150)
BUN: 13.3 mg/dL (ref 7.0–26.0)
Potassium: 4.3 mEq/L (ref 3.5–5.1)

## 2012-07-30 MED ORDER — FILGRASTIM 300 MCG/0.5ML IJ SOLN
300.0000 ug | Freq: Once | INTRAMUSCULAR | Status: AC
  Administered 2012-07-30: 300 ug via SUBCUTANEOUS
  Filled 2012-07-30: qty 0.5

## 2012-07-30 NOTE — Patient Instructions (Signed)
We will use peripheral veins as long as possible, but can have PICC placed if veins do not last for rest of the chemotherapy

## 2012-07-30 NOTE — Progress Notes (Signed)
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 She has maintained counts better with addition of neupogen every Tuesday, this timing due to her work schedule. She is tolerating treatment well overall, tho peripheral IV access is not easy and she has some intermittent low grade nausea which improves when she eats.  She is to see Dr Nelly Rout next on 08-20-12.  Patient had last gyn exam spring 2013 in [place]. She felt in usual excellent health thru Sept 2013,very athletic including 50 mile biking at [employer] in Sept, also swimming and hiking. In Oct 2013 she was more fatigued, with some fecal urge incontinence and abdominal fullness such that she began to diet to lose weight. By Fabienne Bruns she could not climb one flight of stairs and could walk only from office to car without SOB and coughing. She was seen at urgent care in Dec with bilateral pleural effusions on CXR, referred to Dr Francella Solian, with 1.6 liter right thoracentesis done 03-11-12 (no path in this EMR). She had CT CAP and CT angio chest with large bilateral pleural effusions, no PE, ascites and pelvic mass (see below). She was admitted 1-6 thru 04-06-12, with US paracentesis for 1.4 liters on 04-02-12 and left thoracentesis for 2 liters on 04-05-12. CA 125 was 2418. She was seen in consultation in hospital by Dr Nelly Rout on 04-02-12, then readmitted for surgery by gyn oncology on 04-09-12. The procedure by Dr Nelly Rout was exploratory laparotomy with TAH/BSO, radical debulking and omentectomy, which was optimal debulking with only residual at completion of procedure being 7 mm area on posterior surface of right lobe liver. She was transfused 2 units PRBCs on 04-10-12 for Hgb down to 7.2 (from 12 preop). Post  operative course otherwise was not remarkable and she was DC home on 04-13-12. Pathology [phone]) high grade serous carcinoma involving right pelvic mass, serosa of uterus and peritoneum, omentum negative, no nodes submitted. Adjuvant therapy with dose dense taxol/ carboplatin began 05-03-12.    Slight nausea as above. Minimal neuropathy symptoms in feet, not too bothersome and not interfering with activity. Bowels ok. No fever or symptoms of infection. No SOB, cough, abdominal or pelvic pain. Energy adequate for regular work schedule. Remainder of 10 point Review of Systems negative.  We have discussed PICC vs continuing peripheral venous access. Her work schedule actually would allow PICC flushes in afternoons, and she would be willing to have PICC if necessary, but wants to use peripheral veins as long as possible. I agree with this, as the least time possible with PICC should decrease risk of complications from that.  Objective:  Vital signs in last 24 hours:  BP 131/72  Pulse 102  Temp(Src) 97.7 F (36.5 C) (Oral)  Resp 18  Ht 5' 7.5" (1.715 m)  Wt 157 lb 1.6 oz (71.26 kg)  BMI 24.23 kg/m2  Weight is up ~ 6 lbs. Easily ambulatory, looks comfortable, very pleasant as always.  HEENT:PERRLA, sclera clear, anicteric, oropharynx clear, no lesions and neck supple with midline trachea . She does not have complete alopecia. LymphaticsCervical, supraclavicular, and axillary nodes normal. No inguinal adenopathy Resp: clear to auscultation bilaterally and normal percussion bilaterally Back nontender Cardio: regular rate and rhythm GI: soft, non-tender; bowel sounds normal; no masses,  no organomegaly.  Surgical incision well healed Extremities: extremities normal, atraumatic, no cyanosis or edema Neuro:no sensory deficits noted Skin with resolving ecchymosis right ventral forearm at site of attempted IV, no erythema, cord, tenderness  Lab Results:  Results for orders placed in visit on  07/30/12  CBC WITH DIFFERENTIAL      Result Value Range   WBC 2.2 (*) 3.9 - 10.3 10e3/uL   NEUT# 1.3 (*) 1.5 - 6.5 10e3/uL   HGB 9.6 (*) 11.6 - 15.9 g/dL   HCT 16.1 (*) 09.6 - 04.5 %   Platelets 143 (*) 145 - 400 10e3/uL   MCV 97.5  79.5 - 101.0 fL   MCH 34.1 (*) 25.1 - 34.0 pg   MCHC 35.0  31.5 - 36.0 g/dL   RBC 4.09 (*) 8.11 - 9.14 10e6/uL   RDW 21.0 (*) 11.2 - 14.5 %   lymph# 0.8 (*) 0.9 - 3.3 10e3/uL   MONO# 0.1  0.1 - 0.9 10e3/uL   Eosinophils Absolute 0.0  0.0 - 0.5 10e3/uL   Basophils Absolute 0.0  0.0 - 0.1 10e3/uL   NEUT% 58.6  38.4 - 76.8 %   LYMPH% 36.1  14.0 - 49.7 %   MONO% 4.5  0.0 - 14.0 %   EOS% 0.5  0.0 - 7.0 %   BASO% 0.3  0.0 - 2.0 %  COMPREHENSIVE METABOLIC PANEL (CC13)      Result Value Range   Sodium 138  136 - 145 mEq/L   Potassium 4.3  3.5 - 5.1 mEq/L   Chloride 101  98 - 107 mEq/L   CO2 30 (*) 22 - 29 mEq/L   Glucose 90  70 - 99 mg/dl   BUN 78.2  7.0 - 95.6 mg/dL   Creatinine 0.7  0.6 - 1.1 mg/dL   Total Bilirubin 2.13  0.20 - 1.20 mg/dL   Alkaline Phosphatase 68  40 - 150 U/L   AST 13  5 - 34 U/L   ALT 22  0 - 55 U/L   Total Protein 6.7  6.4 - 8.3 g/dL   Albumin 3.7  3.5 - 5.0 g/dL   Calcium 8.9  8.4 - 08.6 mg/dL    CA 578 available after visit 30, this having been 30 also in early April, 53 in March, 104 in Feb and 2418 in Jan.  Studies/Results:  No results found.  Medications: I have reviewed the patient's current medications. No changes to current regimen.  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 Continue same, including neupogen once weekly. She will have day 1 cycle 5 on 08-02-12 as long as ANC approximately >=1.5 and plt >=100. She will see Dr Nelly Rout later this month in ongoing followup. 2.slight preexisting peripheral neuropathy fingertips right which we will follow on the taxol. Minimal  neuropathy symptoms in feet.  3. More difficult peripheral IV access, which patient still prefers to central line if possible. PICC would be appropriate if peripheral access is not adequate to complete course of chemo. 4.overdue mammograms which we will arrange probably after this intensive treatment is completed. Post breast augmentations.  5.no colonoscopy, which she would also like done after chemotherapy Patient is comfortable with discussion and plan above.    Aubert Choyce P, MD   07/30/2012, 12:42 PM

## 2012-07-30 NOTE — Telephone Encounter (Signed)
Shelley Sanchez AN STAFF MESSAGE TO ADD THE PT IN FOR HER CHEMO APPTS

## 2012-08-01 ENCOUNTER — Other Ambulatory Visit: Payer: Self-pay | Admitting: Oncology

## 2012-08-02 ENCOUNTER — Other Ambulatory Visit (HOSPITAL_BASED_OUTPATIENT_CLINIC_OR_DEPARTMENT_OTHER): Payer: BC Managed Care – PPO | Admitting: Lab

## 2012-08-02 ENCOUNTER — Ambulatory Visit (HOSPITAL_BASED_OUTPATIENT_CLINIC_OR_DEPARTMENT_OTHER): Payer: BC Managed Care – PPO

## 2012-08-02 VITALS — BP 115/69 | HR 100 | Resp 18

## 2012-08-02 DIAGNOSIS — Z5111 Encounter for antineoplastic chemotherapy: Secondary | ICD-10-CM

## 2012-08-02 DIAGNOSIS — C561 Malignant neoplasm of right ovary: Secondary | ICD-10-CM

## 2012-08-02 DIAGNOSIS — C569 Malignant neoplasm of unspecified ovary: Secondary | ICD-10-CM

## 2012-08-02 LAB — CBC WITH DIFFERENTIAL/PLATELET
BASO%: 0.1 % (ref 0.0–2.0)
Basophils Absolute: 0 10*3/uL (ref 0.0–0.1)
EOS%: 0 % (ref 0.0–7.0)
HGB: 9.4 g/dL — ABNORMAL LOW (ref 11.6–15.9)
MCH: 33.6 pg (ref 25.1–34.0)
MCV: 100 fL (ref 79.5–101.0)
MONO%: 1.5 % (ref 0.0–14.0)
NEUT#: 8.6 10*3/uL — ABNORMAL HIGH (ref 1.5–6.5)
RBC: 2.8 10*6/uL — ABNORMAL LOW (ref 3.70–5.45)
RDW: 19.1 % — ABNORMAL HIGH (ref 11.2–14.5)
lymph#: 0.6 10*3/uL — ABNORMAL LOW (ref 0.9–3.3)

## 2012-08-02 MED ORDER — SODIUM CHLORIDE 0.9 % IV SOLN
80.0000 mg/m2 | Freq: Once | INTRAVENOUS | Status: AC
  Administered 2012-08-02: 138 mg via INTRAVENOUS
  Filled 2012-08-02: qty 23

## 2012-08-02 MED ORDER — SODIUM CHLORIDE 0.9 % IV SOLN
590.0000 mg | Freq: Once | INTRAVENOUS | Status: AC
  Administered 2012-08-02: 590 mg via INTRAVENOUS
  Filled 2012-08-02: qty 59

## 2012-08-02 MED ORDER — ONDANSETRON 16 MG/50ML IVPB (CHCC)
16.0000 mg | Freq: Once | INTRAVENOUS | Status: AC
  Administered 2012-08-02: 16 mg via INTRAVENOUS

## 2012-08-02 MED ORDER — DEXAMETHASONE SODIUM PHOSPHATE 20 MG/5ML IJ SOLN
20.0000 mg | Freq: Once | INTRAMUSCULAR | Status: AC
  Administered 2012-08-02: 20 mg via INTRAVENOUS

## 2012-08-02 MED ORDER — SODIUM CHLORIDE 0.9 % IV SOLN
Freq: Once | INTRAVENOUS | Status: AC
  Administered 2012-08-02: 13:00:00 via INTRAVENOUS

## 2012-08-02 MED ORDER — DIPHENHYDRAMINE HCL 50 MG/ML IJ SOLN
50.0000 mg | Freq: Once | INTRAMUSCULAR | Status: AC
  Administered 2012-08-02: 50 mg via INTRAVENOUS

## 2012-08-02 MED ORDER — FAMOTIDINE IN NACL 20-0.9 MG/50ML-% IV SOLN
20.0000 mg | Freq: Once | INTRAVENOUS | Status: AC
  Administered 2012-08-02: 20 mg via INTRAVENOUS

## 2012-08-02 NOTE — Patient Instructions (Addendum)
Carboplatin injection  What is this medicine?  CARBOPLATIN (KAR boe pla tin) is a chemotherapy drug. It targets fast dividing cells, like cancer cells, and causes these cells to die. This medicine is used to treat ovarian cancer and many other cancers.  This medicine may be used for other purposes; ask your health care provider or pharmacist if you have questions.  What should I tell my health care provider before I take this medicine?  They need to know if you have any of these conditions:  -blood disorders  -hearing problems  -kidney disease  -recent or ongoing radiation therapy  -an unusual or allergic reaction to carboplatin, cisplatin, other chemotherapy, other medicines, foods, dyes, or preservatives  -pregnant or trying to get pregnant  -breast-feeding  How should I use this medicine?  This drug is usually given as an infusion into a vein. It is administered in a hospital or clinic by a specially trained health care professional.  Talk to your pediatrician regarding the use of this medicine in children. Special care may be needed.  Overdosage: If you think you have taken too much of this medicine contact a poison control center or emergency room at once.  NOTE: This medicine is only for you. Do not share this medicine with others.  What if I miss a dose?  It is important not to miss a dose. Call your doctor or health care professional if you are unable to keep an appointment.  What may interact with this medicine?  -medicines for seizures  -medicines to increase blood counts like filgrastim, pegfilgrastim, sargramostim  -some antibiotics like amikacin, gentamicin, neomycin, streptomycin, tobramycin  -vaccines  Talk to your doctor or health care professional before taking any of these medicines:  -acetaminophen  -aspirin  -ibuprofen  -ketoprofen  -naproxen  This list may not describe all possible interactions. Give your health care provider a list of all the medicines, herbs, non-prescription drugs, or dietary  supplements you use. Also tell them if you smoke, drink alcohol, or use illegal drugs. Some items may interact with your medicine.  What should I watch for while using this medicine?  Your condition will be monitored carefully while you are receiving this medicine. You will need important blood work done while you are taking this medicine.  This drug may make you feel generally unwell. This is not uncommon, as chemotherapy can affect healthy cells as well as cancer cells. Report any side effects. Continue your course of treatment even though you feel ill unless your doctor tells you to stop.  In some cases, you may be given additional medicines to help with side effects. Follow all directions for their use.  Call your doctor or health care professional for advice if you get a fever, chills or sore throat, or other symptoms of a cold or flu. Do not treat yourself. This drug decreases your body's ability to fight infections. Try to avoid being around people who are sick.  This medicine may increase your risk to bruise or bleed. Call your doctor or health care professional if you notice any unusual bleeding.  Be careful brushing and flossing your teeth or using a toothpick because you may get an infection or bleed more easily. If you have any dental work done, tell your dentist you are receiving this medicine.  Avoid taking products that contain aspirin, acetaminophen, ibuprofen, naproxen, or ketoprofen unless instructed by your doctor. These medicines may hide a fever.  Do not become pregnant while taking this medicine.   Do not breast-feed an infant while taking this medicine. What side effects may I notice from receiving this medicine? Side effects that you should report to your  doctor or health care professional as soon as possible: -allergic reactions like skin rash, itching or hives, swelling of the face, lips, or tongue -signs of infection - fever or chills, cough, sore throat, pain or difficulty passing urine -signs of decreased platelets or bleeding - bruising, pinpoint red spots on the skin, black, tarry stools, nosebleeds -signs of decreased red blood cells - unusually weak or tired, fainting spells, lightheadedness -breathing problems -changes in hearing -changes in vision -chest pain -high blood pressure -low blood counts - This drug may decrease the number of white blood cells, red blood cells and platelets. You may be at increased risk for infections and bleeding. -nausea and vomiting -pain, swelling, redness or irritation at the injection site -pain, tingling, numbness in the hands or feet -problems with balance, talking, walking -trouble passing urine or change in the amount of urine Side effects that usually do not require medical attention (report to your doctor or health care professional if they continue or are bothersome): -hair loss -loss of appetite -metallic taste in the mouth or changes in taste This list may not describe all possible side effects. Call your doctor for medical advice about side effects. You may report side effects to FDA at 1-800-FDA-1088. Where should I keep my medicine? This drug is given in a hospital or clinic and will not be stored at home. NOTE: This sheet is a summary. It may not cover all possible information. If you have questions about this medicine, talk to your doctor, pharmacist, or health care provider.  2012, [employer]vier/Gold Standard. (06/18/2007 2:38:05 PM)Paclitaxel injection What is this medicine? PACLITAXEL (PAK li TAX el) is a chemotherapy drug. It targets fast dividing cells, like cancer cells, and causes these cells to die. This medicine is used to treat ovarian cancer, breast cancer, and other  cancers. This medicine may be used for other purposes; ask your health care provider or pharmacist if you have questions. What should I tell my health care provider before I take this medicine? They need to know if you have any of these conditions: -blood disorders -irregular heartbeat -infection (especially a virus infection such as chickenpox, cold sores, or herpes) -liver disease -previous or ongoing radiation therapy -an unusual or allergic reaction to paclitaxel, alcohol, polyoxyethylated castor oil, other chemotherapy agents, other medicines, foods, dyes, or preservatives -pregnant or trying to get pregnant -breast-feeding How should I use this medicine? This drug is given as an infusion into a vein. It is administered in a hospital or clinic by a specially trained health care professional. Talk to your pediatrician regarding the use of this medicine in children. Special care may be needed. Overdosage: If you think you have taken too much of this medicine contact a poison control center or emergency room at once. NOTE: This medicine is only for you. Do not share this medicine with others. What if I miss a dose? It is important not to miss your dose. Call your doctor or health care professional if you are unable to keep an appointment. What may interact with this medicine? Do not take this medicine with any of the following medications: -disulfiram -metronidazole This medicine may also interact with the following medications: -cyclosporine -dexamethasone -diazepam -ketoconazole -medicines to increase blood counts like filgrastim, pegfilgrastim, sargramostim -other chemotherapy drugs like cisplatin, doxorubicin, epirubicin, etoposide, teniposide, vincristine -quinidine -testosterone -vaccines -  verapamil Talk to your doctor or health care professional before taking any of these medicines: -acetaminophen -aspirin -ibuprofen -ketoprofen -naproxen This list may not describe  all possible interactions. Give your health care provider a list of all the medicines, herbs, non-prescription drugs, or dietary supplements you use. Also tell them if you smoke, drink alcohol, or use illegal drugs. Some items may interact with your medicine. What should I watch for while using this medicine? Your condition will be monitored carefully while you are receiving this medicine. You will need important blood work done while you are taking this medicine. This drug may make you feel generally unwell. This is not uncommon, as chemotherapy can affect healthy cells as well as cancer cells. Report any side effects. Continue your course of treatment even though you feel ill unless your doctor tells you to stop. In some cases, you may be given additional medicines to help with side effects. Follow all directions for their use. Call your doctor or health care professional for advice if you get a fever, chills or sore throat, or other symptoms of a cold or flu. Do not treat yourself. This drug decreases your body's ability to fight infections. Try to avoid being around people who are sick. This medicine may increase your risk to bruise or bleed. Call your doctor or health care professional if you notice any unusual bleeding. Be careful brushing and flossing your teeth or using a toothpick because you may get an infection or bleed more easily. If you have any dental work done, tell your dentist you are receiving this medicine. Avoid taking products that contain aspirin, acetaminophen, ibuprofen, naproxen, or ketoprofen unless instructed by your doctor. These medicines may hide a fever. Do not become pregnant while taking this medicine. Women should inform their doctor if they wish to become pregnant or think they might be pregnant. There is a potential for serious side effects to an unborn child. Talk to your health care professional or pharmacist for more information. Do not breast-feed an infant while  taking this medicine. Men are advised not to father a child while receiving this medicine. What side effects may I notice from receiving this medicine? Side effects that you should report to your doctor or health care professional as soon as possible: -allergic reactions like skin rash, itching or hives, swelling of the face, lips, or tongue -low blood counts - This drug may decrease the number of white blood cells, red blood cells and platelets. You may be at increased risk for infections and bleeding. -signs of infection - fever or chills, cough, sore throat, pain or difficulty passing urine -signs of decreased platelets or bleeding - bruising, pinpoint red spots on the skin, black, tarry stools, nosebleeds -signs of decreased red blood cells - unusually weak or tired, fainting spells, lightheadedness -breathing problems -chest pain -high or low blood pressure -mouth sores -nausea and vomiting -pain, swelling, redness or irritation at the injection site -pain, tingling, numbness in the hands or feet -slow or irregular heartbeat -swelling of the ankle, feet, hands Side effects that usually do not require medical attention (report to your doctor or health care professional if they continue or are bothersome): -bone pain -complete hair loss including hair on your head, underarms, pubic hair, eyebrows, and eyelashes -changes in the color of fingernails -diarrhea -loosening of the fingernails -loss of appetite -muscle or joint pain -red flush to skin -sweating This list may not describe all possible side effects. Call your doctor for medical advice  about side effects. You may report side effects to FDA at 1-800-FDA-1088. Where should I keep my medicine? This drug is given in a hospital or clinic and will not be stored at home. NOTE: This sheet is a summary. It may not cover all possible information. If you have questions about this medicine, talk to your doctor, pharmacist, or health care  provider.  2013, [employer]/Gold Standard. (02/24/2008 11:54:26 AM)

## 2012-08-05 ENCOUNTER — Telehealth: Payer: Self-pay | Admitting: Oncology

## 2012-08-05 ENCOUNTER — Telehealth: Payer: Self-pay | Admitting: *Deleted

## 2012-08-05 NOTE — Telephone Encounter (Signed)
S/W THE PT AND SHE IS AWARE TO PICK UP A SCHEDULE TOMORROW WHEN SHE COMES IN FOR HER INJECTION APPT ON 08/06/2012.

## 2012-08-05 NOTE — Telephone Encounter (Signed)
Per staff message and POF I have scheduled appts.  [name]  

## 2012-08-06 ENCOUNTER — Ambulatory Visit (HOSPITAL_BASED_OUTPATIENT_CLINIC_OR_DEPARTMENT_OTHER): Payer: BC Managed Care – PPO

## 2012-08-06 VITALS — BP 123/67 | HR 121 | Temp 98.1°F | Resp 20

## 2012-08-06 DIAGNOSIS — Z5189 Encounter for other specified aftercare: Secondary | ICD-10-CM

## 2012-08-06 DIAGNOSIS — C561 Malignant neoplasm of right ovary: Secondary | ICD-10-CM

## 2012-08-06 DIAGNOSIS — C569 Malignant neoplasm of unspecified ovary: Secondary | ICD-10-CM

## 2012-08-06 MED ORDER — FILGRASTIM 300 MCG/0.5ML IJ SOLN
300.0000 ug | Freq: Once | INTRAMUSCULAR | Status: AC
  Administered 2012-08-06: 300 ug via SUBCUTANEOUS
  Filled 2012-08-06: qty 0.5

## 2012-08-09 ENCOUNTER — Other Ambulatory Visit (HOSPITAL_BASED_OUTPATIENT_CLINIC_OR_DEPARTMENT_OTHER): Payer: BC Managed Care – PPO | Admitting: Lab

## 2012-08-09 ENCOUNTER — Ambulatory Visit (HOSPITAL_BASED_OUTPATIENT_CLINIC_OR_DEPARTMENT_OTHER): Payer: BC Managed Care – PPO

## 2012-08-09 VITALS — BP 110/58 | HR 91 | Temp 97.8°F | Resp 16

## 2012-08-09 DIAGNOSIS — Z5111 Encounter for antineoplastic chemotherapy: Secondary | ICD-10-CM

## 2012-08-09 DIAGNOSIS — C561 Malignant neoplasm of right ovary: Secondary | ICD-10-CM

## 2012-08-09 DIAGNOSIS — C569 Malignant neoplasm of unspecified ovary: Secondary | ICD-10-CM

## 2012-08-09 LAB — CBC WITH DIFFERENTIAL/PLATELET
BASO%: 0.2 % (ref 0.0–2.0)
Basophils Absolute: 0 10*3/uL (ref 0.0–0.1)
HCT: 28.1 % — ABNORMAL LOW (ref 34.8–46.6)
HGB: 9.2 g/dL — ABNORMAL LOW (ref 11.6–15.9)
MONO#: 0.1 10*3/uL (ref 0.1–0.9)
NEUT%: 90.9 % — ABNORMAL HIGH (ref 38.4–76.8)
RDW: 17.5 % — ABNORMAL HIGH (ref 11.2–14.5)
WBC: 6.2 10*3/uL (ref 3.9–10.3)
lymph#: 0.5 10*3/uL — ABNORMAL LOW (ref 0.9–3.3)

## 2012-08-09 MED ORDER — SODIUM CHLORIDE 0.9 % IV SOLN
80.0000 mg/m2 | Freq: Once | INTRAVENOUS | Status: AC
  Administered 2012-08-09: 138 mg via INTRAVENOUS
  Filled 2012-08-09: qty 23

## 2012-08-09 MED ORDER — DIPHENHYDRAMINE HCL 50 MG/ML IJ SOLN
50.0000 mg | Freq: Once | INTRAMUSCULAR | Status: AC
  Administered 2012-08-09: 50 mg via INTRAVENOUS

## 2012-08-09 MED ORDER — SODIUM CHLORIDE 0.9 % IV SOLN
Freq: Once | INTRAVENOUS | Status: AC
  Administered 2012-08-09: 13:00:00 via INTRAVENOUS

## 2012-08-09 MED ORDER — FAMOTIDINE IN NACL 20-0.9 MG/50ML-% IV SOLN
20.0000 mg | Freq: Once | INTRAVENOUS | Status: AC
  Administered 2012-08-09: 20 mg via INTRAVENOUS

## 2012-08-09 MED ORDER — DEXAMETHASONE SODIUM PHOSPHATE 20 MG/5ML IJ SOLN
20.0000 mg | Freq: Once | INTRAMUSCULAR | Status: AC
  Administered 2012-08-09: 20 mg via INTRAVENOUS

## 2012-08-09 MED ORDER — ONDANSETRON 8 MG/50ML IVPB (CHCC)
8.0000 mg | Freq: Once | INTRAVENOUS | Status: AC
  Administered 2012-08-09: 8 mg via INTRAVENOUS

## 2012-08-09 NOTE — Patient Instructions (Addendum)
[facility] Discharge Instructions for Patients Receiving Chemotherapy  Today you received the following chemotherapy agents taxol  To help prevent nausea and vomiting after your treatment, we encourage you to take your nausea medication as directed by MD Begin taking it at 7 pm if needed.   If you develop nausea and vomiting that is not controlled by your nausea medication, call the clinic. If it is after clinic hours your family physician or the after hours number for the clinic or go to the Emergency Department.   BELOW ARE SYMPTOMS THAT SHOULD BE REPORTED IMMEDIATELY:  *FEVER GREATER THAN 100.5 F  *CHILLS WITH OR WITHOUT FEVER  NAUSEA AND VOMITING THAT IS NOT CONTROLLED WITH YOUR NAUSEA MEDICATION  *UNUSUAL SHORTNESS OF BREATH  *UNUSUAL BRUISING OR BLEEDING  TENDERNESS IN MOUTH AND THROAT WITH OR WITHOUT PRESENCE OF ULCERS  *URINARY PROBLEMS  *BOWEL PROBLEMS  UNUSUAL RASH Items with * indicate a potential emergency and should be followed up as soon as possible.  Feel free to call the clinic you have any questions or concerns. The clinic phone number is [phone].   I have been informed and understand all the instructions given to me. I know to contact the clinic, my physician, or go to the Emergency Department if any problems should occur. I do not have any questions at this time, but understand that I may call the clinic during office hours   should I have any questions or need assistance in obtaining follow up care.    __________________________________________  _____________  __________ Signature of Patient or Authorized Representative            Date                   Time    __________________________________________ Nurse's Signature

## 2012-08-13 ENCOUNTER — Ambulatory Visit (HOSPITAL_BASED_OUTPATIENT_CLINIC_OR_DEPARTMENT_OTHER): Payer: BC Managed Care – PPO

## 2012-08-13 VITALS — BP 111/63 | HR 96 | Temp 98.4°F

## 2012-08-13 DIAGNOSIS — C569 Malignant neoplasm of unspecified ovary: Secondary | ICD-10-CM

## 2012-08-13 DIAGNOSIS — Z5189 Encounter for other specified aftercare: Secondary | ICD-10-CM

## 2012-08-13 DIAGNOSIS — C561 Malignant neoplasm of right ovary: Secondary | ICD-10-CM

## 2012-08-13 MED ORDER — FILGRASTIM 300 MCG/0.5ML IJ SOLN
300.0000 ug | Freq: Once | INTRAMUSCULAR | Status: AC
  Administered 2012-08-13: 300 ug via SUBCUTANEOUS
  Filled 2012-08-13: qty 0.5

## 2012-08-16 ENCOUNTER — Ambulatory Visit (HOSPITAL_BASED_OUTPATIENT_CLINIC_OR_DEPARTMENT_OTHER): Payer: BC Managed Care – PPO

## 2012-08-16 ENCOUNTER — Other Ambulatory Visit (HOSPITAL_BASED_OUTPATIENT_CLINIC_OR_DEPARTMENT_OTHER): Payer: BC Managed Care – PPO | Admitting: Lab

## 2012-08-16 VITALS — BP 106/64 | HR 105 | Temp 98.5°F | Resp 16

## 2012-08-16 DIAGNOSIS — C561 Malignant neoplasm of right ovary: Secondary | ICD-10-CM

## 2012-08-16 DIAGNOSIS — Z5111 Encounter for antineoplastic chemotherapy: Secondary | ICD-10-CM

## 2012-08-16 DIAGNOSIS — C569 Malignant neoplasm of unspecified ovary: Secondary | ICD-10-CM

## 2012-08-16 LAB — CBC WITH DIFFERENTIAL/PLATELET
BASO%: 0.2 % (ref 0.0–2.0)
EOS%: 0 % (ref 0.0–7.0)
Eosinophils Absolute: 0 10*3/uL (ref 0.0–0.5)
MCHC: 34 g/dL (ref 31.5–36.0)
MCV: 101.2 fL — ABNORMAL HIGH (ref 79.5–101.0)
MONO%: 3.5 % (ref 0.0–14.0)
NEUT#: 4.9 10*3/uL (ref 1.5–6.5)
RBC: 2.59 10*6/uL — ABNORMAL LOW (ref 3.70–5.45)
RDW: 17.8 % — ABNORMAL HIGH (ref 11.2–14.5)

## 2012-08-16 MED ORDER — FAMOTIDINE IN NACL 20-0.9 MG/50ML-% IV SOLN
20.0000 mg | Freq: Once | INTRAVENOUS | Status: AC
  Administered 2012-08-16: 20 mg via INTRAVENOUS

## 2012-08-16 MED ORDER — DIPHENHYDRAMINE HCL 50 MG/ML IJ SOLN
50.0000 mg | Freq: Once | INTRAMUSCULAR | Status: AC
  Administered 2012-08-16: 50 mg via INTRAVENOUS

## 2012-08-16 MED ORDER — SODIUM CHLORIDE 0.9 % IV SOLN
80.0000 mg/m2 | Freq: Once | INTRAVENOUS | Status: AC
  Administered 2012-08-16: 138 mg via INTRAVENOUS
  Filled 2012-08-16: qty 23

## 2012-08-16 MED ORDER — SODIUM CHLORIDE 0.9 % IV SOLN
Freq: Once | INTRAVENOUS | Status: AC
  Administered 2012-08-16: 12:00:00 via INTRAVENOUS

## 2012-08-16 MED ORDER — DEXAMETHASONE SODIUM PHOSPHATE 20 MG/5ML IJ SOLN
20.0000 mg | Freq: Once | INTRAMUSCULAR | Status: AC
  Administered 2012-08-16: 20 mg via INTRAVENOUS

## 2012-08-16 MED ORDER — ONDANSETRON 8 MG/50ML IVPB (CHCC)
8.0000 mg | Freq: Once | INTRAVENOUS | Status: AC
  Administered 2012-08-16: 8 mg via INTRAVENOUS

## 2012-08-16 NOTE — Patient Instructions (Addendum)
[facility] Discharge Instructions for Patients Receiving Chemotherapy  Today you received the following chemotherapy agents taxol  To help prevent nausea and vomiting after your treatment, we encourage you to take your nausea medication if needed. Begin taking it at 6pm.   If you develop nausea and vomiting that is not controlled by your nausea medication, call the clinic. If it is after clinic hours your family physician or the after hours number for the clinic or go to the Emergency Department.   BELOW ARE SYMPTOMS THAT SHOULD BE REPORTED IMMEDIATELY:  *FEVER GREATER THAN 100.5 F  *CHILLS WITH OR WITHOUT FEVER  NAUSEA AND VOMITING THAT IS NOT CONTROLLED WITH YOUR NAUSEA MEDICATION  *UNUSUAL SHORTNESS OF BREATH  *UNUSUAL BRUISING OR BLEEDING  TENDERNESS IN MOUTH AND THROAT WITH OR WITHOUT PRESENCE OF ULCERS  *URINARY PROBLEMS  *BOWEL PROBLEMS  UNUSUAL RASH Items with * indicate a potential emergency and should be followed up as soon as possible.  Feel free to call the clinic you have any questions or concerns. The clinic phone number is [phone].   I have been informed and understand all the instructions given to me. I know to contact the clinic, my physician, or go to the Emergency Department if any problems should occur. I do not have any questions at this time, but understand that I may call the clinic during office hours   should I have any questions or need assistance in obtaining follow up care.    __________________________________________  _____________  __________ Signature of Patient or Authorized Representative            Date                   Time    __________________________________________ Nurse's Signature

## 2012-08-20 ENCOUNTER — Telehealth: Payer: Self-pay | Admitting: Oncology

## 2012-08-20 ENCOUNTER — Ambulatory Visit (HOSPITAL_BASED_OUTPATIENT_CLINIC_OR_DEPARTMENT_OTHER): Payer: BC Managed Care – PPO

## 2012-08-20 ENCOUNTER — Encounter: Payer: Self-pay | Admitting: Oncology

## 2012-08-20 ENCOUNTER — Ambulatory Visit: Payer: BC Managed Care – PPO | Admitting: Gynecologic Oncology

## 2012-08-20 ENCOUNTER — Other Ambulatory Visit (HOSPITAL_BASED_OUTPATIENT_CLINIC_OR_DEPARTMENT_OTHER): Payer: BC Managed Care – PPO | Admitting: Lab

## 2012-08-20 ENCOUNTER — Ambulatory Visit (HOSPITAL_BASED_OUTPATIENT_CLINIC_OR_DEPARTMENT_OTHER): Payer: BC Managed Care – PPO | Admitting: Oncology

## 2012-08-20 VITALS — BP 109/57 | HR 99 | Temp 98.2°F | Resp 18 | Ht 67.0 in | Wt 160.8 lb

## 2012-08-20 DIAGNOSIS — C569 Malignant neoplasm of unspecified ovary: Secondary | ICD-10-CM

## 2012-08-20 DIAGNOSIS — C561 Malignant neoplasm of right ovary: Secondary | ICD-10-CM

## 2012-08-20 DIAGNOSIS — G609 Hereditary and idiopathic neuropathy, unspecified: Secondary | ICD-10-CM

## 2012-08-20 DIAGNOSIS — Z5189 Encounter for other specified aftercare: Secondary | ICD-10-CM

## 2012-08-20 DIAGNOSIS — D6481 Anemia due to antineoplastic chemotherapy: Secondary | ICD-10-CM

## 2012-08-20 LAB — COMPREHENSIVE METABOLIC PANEL (CC13)
ALT: 22 U/L (ref 0–55)
AST: 13 U/L (ref 5–34)
Chloride: 104 mEq/L (ref 98–107)
Creatinine: 0.7 mg/dL (ref 0.6–1.1)
Sodium: 140 mEq/L (ref 136–145)
Total Bilirubin: 0.48 mg/dL (ref 0.20–1.20)
Total Protein: 6.2 g/dL — ABNORMAL LOW (ref 6.4–8.3)

## 2012-08-20 MED ORDER — FILGRASTIM 300 MCG/0.5ML IJ SOLN
300.0000 ug | Freq: Once | INTRAMUSCULAR | Status: AC
  Administered 2012-08-20: 300 ug via SUBCUTANEOUS
  Filled 2012-08-20: qty 0.5

## 2012-08-20 NOTE — Progress Notes (Signed)
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 Counts are maintaining well with addition of one day of neupogen weekly. Peripheral IV access is still being used albeit somewhat difficult; low grade nausea improves when she eats.   Patient had last gyn exam spring 2013 in [place]. She felt in usual excellent health thru Sept 2013,very athletic including 50 mile biking at [employer] in Sept, also swimming and hiking. In Oct 2013 she was more fatigued, with some fecal urge incontinence and abdominal fullness such that she began to diet to lose weight. By Fabienne Bruns she could not climb one flight of stairs and could walk only from office to car without SOB and coughing. She was seen at urgent care in Dec with bilateral pleural effusions on CXR, referred to Dr Francella Solian, with 1.6 liter right thoracentesis done 03-11-12 (no path in this EMR). She had CT CAP and CT angio chest with large bilateral pleural effusions, no PE, ascites and pelvic mass (see below). She was admitted 1-6 thru 04-06-12, with US paracentesis for 1.4 liters on 04-02-12 and left thoracentesis for 2 liters on 04-05-12. CA 125 was 2418. She was seen in consultation in hospital by Dr Nelly Rout on 04-02-12, then readmitted for surgery by gyn oncology on 04-09-12. The procedure by Dr Nelly Rout was exploratory laparotomy with TAH/BSO, radical debulking and omentectomy, which was optimal debulking with only residual at completion of procedure being 7 mm area on posterior surface of right lobe liver. She was transfused 2 units PRBCs on 04-10-12 for Hgb down to 7.2 (from 12 preop). Post operative course otherwise was not remarkable and she was DC home on 04-13-12. Pathology [phone]) high grade serous carcinoma involving right pelvic  mass, serosa of uterus and peritoneum, omentum negative, no nodes submitted. Adjuvant therapy with dose dense taxol/ carboplatin began 05-03-12. CA125 dropped to 30 by early April.   Minimal numbness in left thumb and feet, which are unchanged and not bothersome. No fever or symptoms of infection. Some burning discomfort right forearm towards completion of last chemotherapy, no discomfort or swelling there subsequently. Some swelling and tenderness right submandibular a few days after last chemo, now resolved. No SOB walking in office today. No bleeding. Bowels ok, bladder ok.  Remainder of 10 point Review of Systems negative.  As we anticipate completion of chemotherapy on 09-06-12, her next appointment with Dr Nelly Rout has been moved to 10-10-12 with CT AP shortly prior.  Objective:  Vital signs in last 24 hours:  BP 109/57  Pulse 99  Temp(Src) 98.2 F (36.8 C) (Oral)  Resp 18  Ht 5\' 7"  (1.702 m)  Wt 160 lb 12.8 oz (72.938 kg)  BMI 25.18 kg/m2  Weight is up 3.5 lb. Has lost most of hair. Easily mobile, respirations not labored RA  HEENT:PERRLA, extra ocular movement intact, sclera clear, anicteric and oropharynx clear, no lesions. No tenderness, cord, swelling, erythema in localized area below right mandible where previous symptoms. LymphaticsCervical, supraclavicular, and axillary nodes normal.No inguinal adenopathy Resp: clear to auscultation bilaterally and normal percussion bilaterally Cardio: regular rate and rhythm GI: soft, non-tender; bowel sounds normal; no masses,  no organomegaly Extremities: extremities normal, atraumatic, no cyanosis or edema Neuro:no sensory deficits noted Skin with minimal resolving ecchymosis right forearm.  Lab Results:  Results for orders placed in visit on 08/20/12  COMPREHENSIVE METABOLIC PANEL (CC13)      Result Value Range   Sodium 140  136 - 145 mEq/L   Potassium 4.0  3.5 - 5.1 mEq/L   Chloride 104  98 - 107 mEq/L   CO2 29  22 - 29 mEq/L    Glucose 105 (*) 70 - 99 mg/dl   BUN 10.2  7.0 - 72.5 mg/dL   Creatinine 0.7  0.6 - 1.1 mg/dL   Total Bilirubin 3.66  0.20 - 1.20 mg/dL   Alkaline Phosphatase 67  40 - 150 U/L   AST 13  5 - 34 U/L   ALT 22  0 - 55 U/L   Total Protein 6.2 (*) 6.4 - 8.3 g/dL   Albumin 3.5  3.5 - 5.0 g/dL   Calcium 9.0  8.4 - 44.0 mg/dL   CBC from 3-47 had WBC 5.8, Hgb 8.9 and plt 198k  Studies/Results:  CT AP requested for ~ July 15, prior to Dr Forrestine Him visit July 17  Medications: I have reviewed the patient's current medications. 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 Continue same for final cycle, including neupogen once weekly.   2.chemo anemia not symptomatic enough to require PRBCx 3. More difficult peripheral IV access, which patient still prefers to central line if possible. PICC would be appropriate if peripheral access is not adequate to complete course of chemo.  4.overdue mammograms which we will arrange probably after this intensive treatment is completed. Post breast augmentations.  5.no colonoscopy, which she would also like done after chemotherapy  Patient is comfortable with discussion and plan above.     Sebastian Dzik P, MD   08/20/2012, 8:14 PM

## 2012-08-20 NOTE — Patient Instructions (Signed)
Appointments as scheduled. Let us know if you are more short of breath or very tired, since you are a bit more anemic.

## 2012-08-20 NOTE — Telephone Encounter (Signed)
gv pt appt schedule for May and June. appt w/Dr. Nelly Rout for 7/17 already on schedule and 6/3 has already been cx'd - pt aware. Pt aware central will call w/ct appt.

## 2012-08-21 ENCOUNTER — Telehealth: Payer: Self-pay

## 2012-08-21 LAB — CA 125: CA 125: 23.2 U/mL (ref 0.0–30.2)

## 2012-08-21 NOTE — Telephone Encounter (Signed)
Message copied by Lorine Bears on Wed Aug 21, 2012  5:46 PM ------      Message from: Jama Flavors P      Created: Wed Aug 21, 2012 12:42 PM       Labs seen and need follow up: please let her know ca125 down to 23 and chemistries ok ------

## 2012-08-21 NOTE — Telephone Encounter (Signed)
Told Shelley Sanchez lab results as noted below by Dr. Darrold Span.

## 2012-08-23 ENCOUNTER — Ambulatory Visit (HOSPITAL_BASED_OUTPATIENT_CLINIC_OR_DEPARTMENT_OTHER): Payer: BC Managed Care – PPO

## 2012-08-23 ENCOUNTER — Other Ambulatory Visit (HOSPITAL_BASED_OUTPATIENT_CLINIC_OR_DEPARTMENT_OTHER): Payer: BC Managed Care – PPO | Admitting: Lab

## 2012-08-23 VITALS — BP 119/71 | HR 116 | Temp 98.1°F

## 2012-08-23 DIAGNOSIS — C561 Malignant neoplasm of right ovary: Secondary | ICD-10-CM

## 2012-08-23 DIAGNOSIS — C569 Malignant neoplasm of unspecified ovary: Secondary | ICD-10-CM

## 2012-08-23 DIAGNOSIS — Z5111 Encounter for antineoplastic chemotherapy: Secondary | ICD-10-CM

## 2012-08-23 LAB — CBC WITH DIFFERENTIAL/PLATELET
BASO%: 0.2 % (ref 0.0–2.0)
EOS%: 0 % (ref 0.0–7.0)
HCT: 27.7 % — ABNORMAL LOW (ref 34.8–46.6)
LYMPH%: 7 % — ABNORMAL LOW (ref 14.0–49.7)
MCH: 33.8 pg (ref 25.1–34.0)
MCHC: 32.9 g/dL (ref 31.5–36.0)
MONO%: 2.4 % (ref 0.0–14.0)
NEUT%: 90.4 % — ABNORMAL HIGH (ref 38.4–76.8)
Platelets: 162 10*3/uL (ref 145–400)

## 2012-08-23 MED ORDER — ONDANSETRON 16 MG/50ML IVPB (CHCC)
16.0000 mg | Freq: Once | INTRAVENOUS | Status: AC
  Administered 2012-08-23: 16 mg via INTRAVENOUS

## 2012-08-23 MED ORDER — SODIUM CHLORIDE 0.9 % IV SOLN
635.0000 mg | Freq: Once | INTRAVENOUS | Status: AC
  Administered 2012-08-23: 640 mg via INTRAVENOUS
  Filled 2012-08-23: qty 64

## 2012-08-23 MED ORDER — SODIUM CHLORIDE 0.9 % IV SOLN
80.0000 mg/m2 | Freq: Once | INTRAVENOUS | Status: AC
  Administered 2012-08-23: 138 mg via INTRAVENOUS
  Filled 2012-08-23: qty 23

## 2012-08-23 MED ORDER — SODIUM CHLORIDE 0.9 % IJ SOLN
10.0000 mL | INTRAMUSCULAR | Status: DC | PRN
  Filled 2012-08-23: qty 10

## 2012-08-23 MED ORDER — DIPHENHYDRAMINE HCL 50 MG/ML IJ SOLN
50.0000 mg | Freq: Once | INTRAMUSCULAR | Status: AC
  Administered 2012-08-23: 50 mg via INTRAVENOUS

## 2012-08-23 MED ORDER — SODIUM CHLORIDE 0.9 % IV SOLN
Freq: Once | INTRAVENOUS | Status: AC
  Administered 2012-08-23: 11:00:00 via INTRAVENOUS

## 2012-08-23 MED ORDER — FAMOTIDINE IN NACL 20-0.9 MG/50ML-% IV SOLN
20.0000 mg | Freq: Once | INTRAVENOUS | Status: AC
  Administered 2012-08-23: 20 mg via INTRAVENOUS

## 2012-08-23 MED ORDER — HEPARIN SOD (PORK) LOCK FLUSH 100 UNIT/ML IV SOLN
500.0000 [IU] | Freq: Once | INTRAVENOUS | Status: DC | PRN
  Filled 2012-08-23: qty 5

## 2012-08-23 MED ORDER — DEXAMETHASONE SODIUM PHOSPHATE 20 MG/5ML IJ SOLN
20.0000 mg | Freq: Once | INTRAMUSCULAR | Status: AC
  Administered 2012-08-23: 20 mg via INTRAVENOUS

## 2012-08-23 NOTE — Patient Instructions (Addendum)
[facility] Discharge Instructions for Patients Receiving Chemotherapy  Today you received the following chemotherapy agents: Taxol, Carboplatin To help prevent nausea and vomiting after your treatment, we encourage you to take your nausea medication: Ativan/Zofran as prescribed.  If you develop nausea and vomiting that is not controlled by your nausea medication, call the clinic. BELOW ARE SYMPTOMS THAT SHOULD BE REPORTED IMMEDIATELY:  *FEVER GREATER THAN 100.5 F  *CHILLS WITH OR WITHOUT FEVER  NAUSEA AND VOMITING THAT IS NOT CONTROLLED WITH YOUR NAUSEA MEDICATION  *UNUSUAL SHORTNESS OF BREATH  *UNUSUAL BRUISING OR BLEEDING  TENDERNESS IN MOUTH AND THROAT WITH OR WITHOUT PRESENCE OF ULCERS  *URINARY PROBLEMS  *BOWEL PROBLEMS  UNUSUAL RASH Items with * indicate a potential emergency and should be followed up as soon as possible.   The clinic phone number is [phone].

## 2012-08-27 ENCOUNTER — Ambulatory Visit: Payer: BC Managed Care – PPO | Admitting: Gynecologic Oncology

## 2012-08-27 ENCOUNTER — Ambulatory Visit (HOSPITAL_BASED_OUTPATIENT_CLINIC_OR_DEPARTMENT_OTHER): Payer: BC Managed Care – PPO

## 2012-08-27 VITALS — BP 116/71 | HR 109 | Temp 98.0°F

## 2012-08-27 DIAGNOSIS — C561 Malignant neoplasm of right ovary: Secondary | ICD-10-CM

## 2012-08-27 DIAGNOSIS — Z5189 Encounter for other specified aftercare: Secondary | ICD-10-CM

## 2012-08-27 DIAGNOSIS — C569 Malignant neoplasm of unspecified ovary: Secondary | ICD-10-CM

## 2012-08-27 MED ORDER — FILGRASTIM 300 MCG/0.5ML IJ SOLN
300.0000 ug | Freq: Once | INTRAMUSCULAR | Status: AC
  Administered 2012-08-27: 300 ug via SUBCUTANEOUS
  Filled 2012-08-27: qty 0.5

## 2012-08-30 ENCOUNTER — Other Ambulatory Visit (HOSPITAL_BASED_OUTPATIENT_CLINIC_OR_DEPARTMENT_OTHER): Payer: BC Managed Care – PPO | Admitting: Lab

## 2012-08-30 ENCOUNTER — Ambulatory Visit (HOSPITAL_BASED_OUTPATIENT_CLINIC_OR_DEPARTMENT_OTHER): Payer: BC Managed Care – PPO

## 2012-08-30 VITALS — BP 111/69 | HR 91 | Temp 97.8°F

## 2012-08-30 DIAGNOSIS — C561 Malignant neoplasm of right ovary: Secondary | ICD-10-CM

## 2012-08-30 DIAGNOSIS — Z5111 Encounter for antineoplastic chemotherapy: Secondary | ICD-10-CM

## 2012-08-30 DIAGNOSIS — C569 Malignant neoplasm of unspecified ovary: Secondary | ICD-10-CM

## 2012-08-30 LAB — CBC WITH DIFFERENTIAL/PLATELET
Eosinophils Absolute: 0 10*3/uL (ref 0.0–0.5)
HCT: 27 % — ABNORMAL LOW (ref 34.8–46.6)
LYMPH%: 9.3 % — ABNORMAL LOW (ref 14.0–49.7)
MCHC: 33 g/dL (ref 31.5–36.0)
MCV: 102.7 fL — ABNORMAL HIGH (ref 79.5–101.0)
MONO%: 2 % (ref 0.0–14.0)
NEUT#: 4.8 10*3/uL (ref 1.5–6.5)
NEUT%: 88.2 % — ABNORMAL HIGH (ref 38.4–76.8)
Platelets: 171 10*3/uL (ref 145–400)
RBC: 2.63 10*6/uL — ABNORMAL LOW (ref 3.70–5.45)
nRBC: 0 % (ref 0–0)

## 2012-08-30 MED ORDER — DIPHENHYDRAMINE HCL 50 MG/ML IJ SOLN
50.0000 mg | Freq: Once | INTRAMUSCULAR | Status: AC
  Administered 2012-08-30: 50 mg via INTRAVENOUS

## 2012-08-30 MED ORDER — ONDANSETRON 8 MG/50ML IVPB (CHCC)
8.0000 mg | Freq: Once | INTRAVENOUS | Status: AC
  Administered 2012-08-30: 8 mg via INTRAVENOUS

## 2012-08-30 MED ORDER — DEXAMETHASONE SODIUM PHOSPHATE 20 MG/5ML IJ SOLN
20.0000 mg | Freq: Once | INTRAMUSCULAR | Status: AC
  Administered 2012-08-30: 20 mg via INTRAVENOUS

## 2012-08-30 MED ORDER — SODIUM CHLORIDE 0.9 % IV SOLN
Freq: Once | INTRAVENOUS | Status: AC
  Administered 2012-08-30: 12:00:00 via INTRAVENOUS

## 2012-08-30 MED ORDER — FAMOTIDINE IN NACL 20-0.9 MG/50ML-% IV SOLN
20.0000 mg | Freq: Once | INTRAVENOUS | Status: AC
  Administered 2012-08-30: 20 mg via INTRAVENOUS

## 2012-08-30 MED ORDER — SODIUM CHLORIDE 0.9 % IV SOLN
80.0000 mg/m2 | Freq: Once | INTRAVENOUS | Status: AC
  Administered 2012-08-30: 138 mg via INTRAVENOUS
  Filled 2012-08-30: qty 23

## 2012-08-30 NOTE — Patient Instructions (Addendum)
[facility] Discharge Instructions for Patients Receiving Chemotherapy  Today you received the following chemotherapy agents: Taxol.  To help prevent nausea and vomiting after your treatment, we encourage you to take your nausea medication as prescribed.   If you develop nausea and vomiting that is not controlled by your nausea medication, call the clinic.   BELOW ARE SYMPTOMS THAT SHOULD BE REPORTED IMMEDIATELY:  *FEVER GREATER THAN 100.5 F  *CHILLS WITH OR WITHOUT FEVER  NAUSEA AND VOMITING THAT IS NOT CONTROLLED WITH YOUR NAUSEA MEDICATION  *UNUSUAL SHORTNESS OF BREATH  *UNUSUAL BRUISING OR BLEEDING  TENDERNESS IN MOUTH AND THROAT WITH OR WITHOUT PRESENCE OF ULCERS  *URINARY PROBLEMS  *BOWEL PROBLEMS  UNUSUAL RASH Items with * indicate a potential emergency and should be followed up as soon as possible.  Feel free to call the clinic you have any questions or concerns. The clinic phone number is [phone].    

## 2012-09-03 ENCOUNTER — Ambulatory Visit (HOSPITAL_BASED_OUTPATIENT_CLINIC_OR_DEPARTMENT_OTHER): Payer: BC Managed Care – PPO

## 2012-09-03 VITALS — BP 113/49 | HR 122 | Temp 98.4°F

## 2012-09-03 DIAGNOSIS — C569 Malignant neoplasm of unspecified ovary: Secondary | ICD-10-CM

## 2012-09-03 DIAGNOSIS — C561 Malignant neoplasm of right ovary: Secondary | ICD-10-CM

## 2012-09-03 DIAGNOSIS — Z5189 Encounter for other specified aftercare: Secondary | ICD-10-CM

## 2012-09-03 MED ORDER — FILGRASTIM 300 MCG/0.5ML IJ SOLN
300.0000 ug | Freq: Once | INTRAMUSCULAR | Status: AC
  Administered 2012-09-03: 300 ug via SUBCUTANEOUS
  Filled 2012-09-03: qty 0.5

## 2012-09-06 ENCOUNTER — Other Ambulatory Visit (HOSPITAL_BASED_OUTPATIENT_CLINIC_OR_DEPARTMENT_OTHER): Payer: BC Managed Care – PPO | Admitting: Lab

## 2012-09-06 ENCOUNTER — Ambulatory Visit (HOSPITAL_BASED_OUTPATIENT_CLINIC_OR_DEPARTMENT_OTHER): Payer: BC Managed Care – PPO

## 2012-09-06 VITALS — BP 114/68 | HR 117 | Temp 98.1°F

## 2012-09-06 DIAGNOSIS — C561 Malignant neoplasm of right ovary: Secondary | ICD-10-CM

## 2012-09-06 DIAGNOSIS — Z5111 Encounter for antineoplastic chemotherapy: Secondary | ICD-10-CM

## 2012-09-06 DIAGNOSIS — C569 Malignant neoplasm of unspecified ovary: Secondary | ICD-10-CM

## 2012-09-06 LAB — CBC WITH DIFFERENTIAL/PLATELET
EOS%: 0 % (ref 0.0–7.0)
LYMPH%: 12.2 % — ABNORMAL LOW (ref 14.0–49.7)
MCH: 34.8 pg — ABNORMAL HIGH (ref 25.1–34.0)
MCHC: 33 g/dL (ref 31.5–36.0)
MCV: 105.5 fL — ABNORMAL HIGH (ref 79.5–101.0)
MONO%: 5.4 % (ref 0.0–14.0)
RBC: 2.53 10*6/uL — ABNORMAL LOW (ref 3.70–5.45)
RDW: 17 % — ABNORMAL HIGH (ref 11.2–14.5)

## 2012-09-06 MED ORDER — ONDANSETRON 8 MG/50ML IVPB (CHCC)
8.0000 mg | Freq: Once | INTRAVENOUS | Status: AC
  Administered 2012-09-06: 8 mg via INTRAVENOUS

## 2012-09-06 MED ORDER — DEXAMETHASONE SODIUM PHOSPHATE 20 MG/5ML IJ SOLN
20.0000 mg | Freq: Once | INTRAMUSCULAR | Status: AC
  Administered 2012-09-06: 20 mg via INTRAVENOUS

## 2012-09-06 MED ORDER — DIPHENHYDRAMINE HCL 50 MG/ML IJ SOLN
50.0000 mg | Freq: Once | INTRAMUSCULAR | Status: AC
  Administered 2012-09-06: 50 mg via INTRAVENOUS

## 2012-09-06 MED ORDER — FAMOTIDINE IN NACL 20-0.9 MG/50ML-% IV SOLN
20.0000 mg | Freq: Once | INTRAVENOUS | Status: AC
Start: 2012-09-06 — End: 2012-09-06
  Administered 2012-09-06: 20 mg via INTRAVENOUS

## 2012-09-06 MED ORDER — SODIUM CHLORIDE 0.9 % IV SOLN
80.0000 mg/m2 | Freq: Once | INTRAVENOUS | Status: AC
  Administered 2012-09-06: 138 mg via INTRAVENOUS
  Filled 2012-09-06: qty 23

## 2012-09-06 MED ORDER — SODIUM CHLORIDE 0.9 % IV SOLN
Freq: Once | INTRAVENOUS | Status: AC
  Administered 2012-09-06: 13:00:00 via INTRAVENOUS

## 2012-09-06 NOTE — Patient Instructions (Signed)
[facility] Discharge Instructions for Patients Receiving Chemotherapy  Today you received the following chemotherapy agents taxol  To help prevent nausea and vomiting after your treatment, we encourage you to take your nausea medication as needed.   If you develop nausea and vomiting that is not controlled by your nausea medication, call the clinic.   BELOW ARE SYMPTOMS THAT SHOULD BE REPORTED IMMEDIATELY:  *FEVER GREATER THAN 100.5 F  *CHILLS WITH OR WITHOUT FEVER  NAUSEA AND VOMITING THAT IS NOT CONTROLLED WITH YOUR NAUSEA MEDICATION  *UNUSUAL SHORTNESS OF BREATH  *UNUSUAL BRUISING OR BLEEDING  TENDERNESS IN MOUTH AND THROAT WITH OR WITHOUT PRESENCE OF ULCERS  *URINARY PROBLEMS  *BOWEL PROBLEMS  UNUSUAL RASH Items with * indicate a potential emergency and should be followed up as soon as possible.  Feel free to call the clinic you have any questions or concerns. The clinic phone number is [phone].    

## 2012-09-10 ENCOUNTER — Ambulatory Visit (HOSPITAL_BASED_OUTPATIENT_CLINIC_OR_DEPARTMENT_OTHER): Payer: BC Managed Care – PPO

## 2012-09-10 VITALS — BP 109/80 | HR 117 | Temp 98.5°F

## 2012-09-10 DIAGNOSIS — C569 Malignant neoplasm of unspecified ovary: Secondary | ICD-10-CM

## 2012-09-10 DIAGNOSIS — C561 Malignant neoplasm of right ovary: Secondary | ICD-10-CM

## 2012-09-10 DIAGNOSIS — Z5189 Encounter for other specified aftercare: Secondary | ICD-10-CM

## 2012-09-10 MED ORDER — FILGRASTIM 300 MCG/0.5ML IJ SOLN
300.0000 ug | Freq: Once | INTRAMUSCULAR | Status: AC
  Administered 2012-09-10: 300 ug via SUBCUTANEOUS
  Filled 2012-09-10: qty 0.5

## 2012-09-15 ENCOUNTER — Encounter: Payer: Self-pay | Admitting: Oncology

## 2012-09-15 ENCOUNTER — Other Ambulatory Visit: Payer: Self-pay | Admitting: Oncology

## 2012-09-15 NOTE — Progress Notes (Signed)
[facility] END OF TREATMENT   Name: Diona Peregoy Date: 09/15/2012 MRN: [id number] DOB: March 22, 1956   TREATMENT DATES: 05-03-2012 thru 09-10-2012   REFERRING PHYSICIAN: Laurette Schimke, MD  DIAGNOSIS: right ovarian carcinoma   STAGE AT START OF TREATMENT: IIIC, high grade serous   INTENT: curative   DRUGS OR REGIMENS GIVEN: dose dense carboplatin and taxol   MAJOR TOXICITIES: fatigue, cytopenias   REASON TREATMENT STOPPED: completion of planned course   PERFORMANCE STATUS AT END: 1   ONGOING PROBLEMS: fatigue, anemia   FOLLOW UP PLANS: restaging scans prior to return visit to gyn oncology in July 2014

## 2012-09-17 ENCOUNTER — Encounter: Payer: Self-pay | Admitting: Oncology

## 2012-09-17 ENCOUNTER — Ambulatory Visit (HOSPITAL_BASED_OUTPATIENT_CLINIC_OR_DEPARTMENT_OTHER): Payer: BC Managed Care – PPO | Admitting: Oncology

## 2012-09-17 ENCOUNTER — Other Ambulatory Visit (HOSPITAL_BASED_OUTPATIENT_CLINIC_OR_DEPARTMENT_OTHER): Payer: BC Managed Care – PPO | Admitting: Lab

## 2012-09-17 ENCOUNTER — Telehealth: Payer: Self-pay | Admitting: Oncology

## 2012-09-17 VITALS — BP 120/72 | HR 96 | Temp 99.0°F | Resp 18 | Ht 67.0 in | Wt 168.1 lb

## 2012-09-17 DIAGNOSIS — Z1231 Encounter for screening mammogram for malignant neoplasm of breast: Secondary | ICD-10-CM

## 2012-09-17 DIAGNOSIS — C569 Malignant neoplasm of unspecified ovary: Secondary | ICD-10-CM

## 2012-09-17 DIAGNOSIS — C561 Malignant neoplasm of right ovary: Secondary | ICD-10-CM

## 2012-09-17 LAB — CBC WITH DIFFERENTIAL/PLATELET
EOS%: 0.4 % (ref 0.0–7.0)
MCH: 33.9 pg (ref 25.1–34.0)
MCHC: 32.5 g/dL (ref 31.5–36.0)
MCV: 104.2 fL — ABNORMAL HIGH (ref 79.5–101.0)
MONO%: 13.3 % (ref 0.0–14.0)
RBC: 2.83 10*6/uL — ABNORMAL LOW (ref 3.70–5.45)
RDW: 16.8 % — ABNORMAL HIGH (ref 11.2–14.5)
nRBC: 0 % (ref 0–0)

## 2012-09-17 LAB — COMPREHENSIVE METABOLIC PANEL (CC13)
ALT: 21 U/L (ref 0–55)
CO2: 28 mEq/L (ref 22–29)
Creatinine: 0.8 mg/dL (ref 0.6–1.1)
Total Bilirubin: 0.56 mg/dL (ref 0.20–1.20)

## 2012-09-17 LAB — CA 125: CA 125: 26.2 U/mL (ref 0.0–30.2)

## 2012-09-17 NOTE — Progress Notes (Signed)
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 She does not have PAC.  ONCOLOGIC HISTORY Patient had last gyn exam spring 2013 in [place]. She felt in usual excellent health thru Sept 2013,very athletic including 50 mile biking at [employer] in Sept, also swimming and hiking. In Oct 2013 she was more fatigued, with some fecal urge incontinence and abdominal fullness such that she began to diet to lose weight. By Fabienne Bruns she could not climb one flight of stairs and could walk only from office to car without SOB and coughing. She was seen at urgent care in Dec with bilateral pleural effusions on CXR, referred to Dr Francella Solian, with 1.6 liter right thoracentesis done 03-11-12 (no path in this EMR). She had CT CAP and CT angio chest with large bilateral pleural effusions, no PE, ascites and pelvic mass (see below). She was admitted 1-6 thru 04-06-12, with US paracentesis for 1.4 liters on 04-02-12 and left thoracentesis for 2 liters on 04-05-12. CA 125 was 2418. She was seen in consultation in hospital by Dr Nelly Rout on 04-02-12, then readmitted for surgery by gyn oncology on 04-09-12. The procedure by Dr Nelly Rout was exploratory laparotomy with TAH/BSO, radical debulking and omentectomy, which was optimal debulking with only residual at completion of procedure being 7 mm area on posterior surface of right lobe liver. She was transfused 2 units PRBCs on 04-10-12 for Hgb down to 7.2 (from 12 preop). Post operative course otherwise was not remarkable and she was DC home on 04-13-12. Pathology [phone]) high grade serous carcinoma involving right pelvic mass, serosa of uterus and peritoneum, omentum negative, no nodes submitted. Adjuvant therapy with dose  dense taxol/ carboplatin began 05-03-12, with CA125 down to 30 by early April. Cycle 6 was completed 09-10-12.  Appetite less since off steroids. Fatigue on exertion unchanged, but she is trying to gradually increase activity. Bowels and bladder ok. No fever or symptoms of infection. Notices slight swelling in LE, no pain. No bleeding. Remainder of 10 point Review of Systems negative.  We discussed increasing exercise as tolerated and diet.  Objective:  Vital signs in last 24 hours:  BP 120/72  Pulse 96  Temp(Src) 99 F (37.2 C) (Oral)  Resp 18  Ht 5\' 7"  (1.702 m)  Wt 168 lb 1.6 oz (76.25 kg)  BMI 26.32 kg/m2  SpO2 100%  Weight is up 33 lbs from start of treatment. Easily ambulatory, looks comfortable.  HEENT:PERRLA, sclera clear, anicteric and oropharynx clear, no lesions Partial alopecia Lymphatics: no cervical, supraclavicular, axillary or inguinal adenopathy Resp: clear to auscultation bilaterally and normal percussion bilaterally Cardio: regular rate and rhythm GI: soft, non-tender; bowel sounds normal; no masses,  no organomegaly Extremities: extremities normal, atraumatic, no cyanosis or edema Neuro:no sensory deficits noted Breast:normal without suspicious masses, skin or nipple changes or axillary nodes She does not have central catheter  Lab Results:  Results for orders placed in visit on 09/17/12  CBC WITH DIFFERENTIAL      Result Value Range   WBC 2.9 (*) 3.9 - 10.3 10e3/uL   NEUT# 1.4 (*) 1.5 - 6.5 10e3/uL   HGB 9.6 (*) 11.6 - 15.9 g/dL   HCT 13.0 (*) 86.5 - 78.4 %   Platelets 128 verified (*) 145 -  400 10e3/uL   MCV 104.2 (*) 79.5 - 101.0 fL   MCH 33.9  25.1 - 34.0 pg   MCHC 32.5  31.5 - 36.0 g/dL   RBC 1.61 (*) 0.96 - 0.45 10e6/uL   RDW 16.8 (*) 11.2 - 14.5 %   lymph# 1.0  0.9 - 3.3 10e3/uL   MONO# 0.4  0.1 - 0.9 10e3/uL   Eosinophils Absolute 0.0  0.0 - 0.5 10e3/uL   Basophils Absolute 0.0  0.0 - 0.1 10e3/uL   NEUT% 49.8  38.4 - 76.8 %   LYMPH% 36.1   14.0 - 49.7 %   MONO% 13.3  0.0 - 14.0 %   EOS% 0.4  0.0 - 7.0 %   BASO% 0.4  0.0 - 2.0 %   nRBC 0  0 - 0 %  COMPREHENSIVE METABOLIC PANEL (CC13)      Result Value Range   Sodium 141  136 - 145 mEq/L   Potassium 4.3  3.5 - 5.1 mEq/L   Chloride 105  98 - 107 mEq/L   CO2 28  22 - 29 mEq/L   Glucose 94  70 - 99 mg/dl   BUN 40.9  7.0 - 81.1 mg/dL   Creatinine 0.8  0.6 - 1.1 mg/dL   Total Bilirubin 9.14  0.20 - 1.20 mg/dL   Alkaline Phosphatase 63  40 - 150 U/L   AST 16  5 - 34 U/L   ALT 21  0 - 55 U/L   Total Protein 6.6  6.4 - 8.3 g/dL   Albumin 3.8  3.5 - 5.0 g/dL   Calcium 9.6  8.4 - 78.2 mg/dL    CA 956 available after visit 26, having been 23 in May, 30 in April, and 2418 at presentation.  Studies/Results: CT AP to be done 10-08-12. She is overdue mammograms which will be scheduled  Medications: I have reviewed the patient's current medications.  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 She will have restaging CT AP on 10-08-12 and see Dr Nelly Rout on 10-10-12. She will have repeat CBC day of Dr Forrestine Him visit. She will see medical oncology with cbc cmet ca 125 in August (vs follow up just with gyn oncology now). 2.slight preexisting peripheral neuropathy fingertips right, no worse with taxol 3.anemia: related to chemotherapy, prior surgery, multiple blood draws. Continue oral iron, repeat CBC with Dr Forrestine Him visit in July and with medical oncology follow up in Aug. 4.overdue mammograms which we will schedule now. Has breast augmentation. 5.no colonoscopy, which she would also like done after chemotherapy. I have asked her to discuss timing of this procedure with Dr Nelly Rout and I have not made referral to GI yet. 6.weight gain thru treatment, related to steroids. She has resumed walking for exercise and is already able to  control appetite better.   Reece Packer, MD   09/17/2012, 12:32 PM

## 2012-09-20 ENCOUNTER — Telehealth: Payer: Self-pay

## 2012-09-20 NOTE — Telephone Encounter (Signed)
Message copied by Lorine Bears on Fri Sep 20, 2012  3:47 PM ------      Message from: Jama Flavors P      Created: Wed Sep 18, 2012  9:00 AM       Labs seen and need follow up  Please let her know ca125 still in good range at 26            Cc LA, TH ------

## 2012-09-20 NOTE — Telephone Encounter (Signed)
Left a message in  patient's vm stating the lab information noted below by Dr. Darrold Span.

## 2012-10-08 ENCOUNTER — Encounter (HOSPITAL_COMMUNITY): Payer: Self-pay

## 2012-10-08 ENCOUNTER — Ambulatory Visit (HOSPITAL_COMMUNITY)
Admission: RE | Admit: 2012-10-08 | Discharge: 2012-10-08 | Disposition: A | Discharge status: Home / Self Care | Payer: BC Managed Care – PPO | Source: Ambulatory Visit | Attending: Oncology | Admitting: Oncology

## 2012-10-08 DIAGNOSIS — C561 Malignant neoplasm of right ovary: Secondary | ICD-10-CM

## 2012-10-08 DIAGNOSIS — Z9221 Personal history of antineoplastic chemotherapy: Secondary | ICD-10-CM | POA: Insufficient documentation

## 2012-10-08 DIAGNOSIS — C569 Malignant neoplasm of unspecified ovary: Secondary | ICD-10-CM | POA: Insufficient documentation

## 2012-10-08 DIAGNOSIS — R918 Other nonspecific abnormal finding of lung field: Secondary | ICD-10-CM | POA: Insufficient documentation

## 2012-10-08 MED ORDER — IOHEXOL 300 MG/ML  SOLN
100.0000 mL | Freq: Once | INTRAMUSCULAR | Status: AC | PRN
  Administered 2012-10-08: 100 mL via INTRAVENOUS

## 2012-10-09 ENCOUNTER — Telehealth: Payer: Self-pay | Admitting: Gynecologic Oncology

## 2012-10-09 DIAGNOSIS — C569 Malignant neoplasm of unspecified ovary: Secondary | ICD-10-CM

## 2012-10-09 NOTE — Telephone Encounter (Signed)
Office Visit Note: Gyn-Onc  Shelley Sanchez 57 y.o. female  CC:  Ovarian cancer surveillance  Assessment/Plan:  57 y.o. with abdominal pelvic mass, ascites, and pleural effusions. CA 125 is elevated to about 2400 and CEA is normal.  She underwent optimal debulking on 04/09/2012. 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  She completed six cycles dose dense  TaxolCarboplatin therapy 08/2012.  PET to assess the 2.1cm pelvic mass  Followup with GYN Onc in 3 months    HPI: 57 y.o. year old G3 P3 LNMP in early 62's. In October 2013 Shelley Sanchez noted fatigue, and fecal urge incontinence. Also noted increasing abdominal girth. No change in appetite, no nausea or vomiting. Reports SOB onset in November 2013. Denies uterine bleeding, no abdominal pain just discomfort. Was evaluated in urgent care care in December 2013 for malaise and referred to Dr. Sherene Sires (Pulmonology) Thoracocentesis performed prior to Christmas and the cytology was negative for malignancy. Reported labored breathing 03/26/2012 and return of pleural effusions was noted. CT Chest/Abd/Pelvis 04/01/2012 notable for ascites so patient was admitted for evaluation. Paracentesis performed 04/01/2012. Results were negative for malignancy.  Patient reports discomfort chest heaviness And cough when lying flat. Reports 20 pound weight loss in the past year. Was able to ride 50 miles, and swam regularly until September 2013.   CT Chest Abd/Pelvis: 04/01/2012  IMPRESSION:  Large bilateral pleural effusions with extensive passive atelectasis in both lungs.  Abdomen/Pelvis: 3.7 x 3.1 cm low attenuation lesion in segment 7 of the liver. Smaller subcentimeter low attenuation lesion is also noted in segment 7 of the liver (image 53 of series 2) There is a large volume of ascites throughout the peritoneal cavity. In the pelvis there is a complex heterogeneous appearing soft tissue mass that appears to have  multiple internal cystic components and some small amounts of calcium that measures at least 12.5 x 12.7 x 11.8 cm.    Shelley Sanchez underwent exploratory laparotomy total abdominal hysterectomy bilateral salpingo-oophorectomy omentectomy radical debulking to optimal disease on 04/09/2012.   The only gross disease present was a 7 mm lesion in the posterior most aspect of the liver.  Path: 1. 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 2. Adnexa - ovary +/- tube, neoplastic, right, and portion of right pelvic mass - HIGH GRADE SEROUS CARCINOMA. 3. Uterus and cervix, with left fallopian tube and left ovary - SEROSA: HIGH GRADE SEROUS CARCINOMA. - CERVIX: UNREMARKABLE. - ENDOMETRIUM: ATROPHIC - LEFT OVARY AND FALLOPIAN TUBE: BENIGN. NO TUMOR IDENTIFIED. 4. Cul-de-sac biopsy, posterior nodule - HIGH GRADE SEROUS CARCINOMA. 5. Peritoneum, biopsy, anterior pelvic - HIGH GRADE SEROUS CARCINOMA. 6. Peritoneum, biopsy, right peri colic gutter - HIGH GRADE SEROUS CARCINOMA 7. Omentum, resection for tumor - FINDINGS CONSISTENT WITH ADHESIONS. NO TUMOR IDENTIFIED. 8. Soft tissue, biopsy, diaphromatic nodule - HIGH GRADE SEROUS CARCINOMA.  Interval History:  The patient s is doing very well. She has received  Six cycles of dose dense Taxol carboplatin therapy  Lab Results  Component Value Date   CA125 26.2 09/17/2012   CT 10/21/2012 IMPRESSION:  1. Small subpleural pulmonary nodules noted at the left lung base. Recommend attention on future studies. The large pleural effusions have resolved.  2. Resection of large pelvic mass with a small scattered pelvic lymph nodes and a 2.1 cm soft tissue density in the right pelvis. Recommend attention on future studies.  3. Scattered retroperitoneal and mesenteric lymph  nodes but no mass or omental peritoneal surface caking or nodularity.     Social Hx:    Past Surgical Hx:  Past Surgical History   Procedure Laterality Date  . Breast enhancement surgery  2000  . Laparotomy  04/09/2012    Procedure: EXPLORATORY LAPAROTOMY;  Surgeon: Laurette Schimke, MD PHD;  Location: [facility];  Service: Gynecology;  Laterality: N/A;  EXPLORATORY LAPAROTOMY, TOTAL ABDOMINAL HYESTERCTOMY, BILATERAL SALPINGO OOPHERECTOMY  . Abdominal hysterectomy  04/09/2012    Procedure: HYSTERECTOMY ABDOMINAL;  Surgeon: Laurette Schimke, MD PHD;  Location: [facility];  Service: Gynecology;  Laterality: N/A;    Past Medical Hx:  Past Medical History  Diagnosis Date  . Skin cancer   . Shortness of breath     bilateral pleural effusions - s/p thoracentesis  . Ascites     s/p paracentesis  . Pelvic mass     Family Hx:  Family History  Problem Relation Age of Onset  . Melanoma Mother   . Skin cancer Brother     Vitals:  Blood pressure 100/64, pulse 64, temperature 97.9 F (36.6 C), temperature source Oral, resp. rate 16, height 5' 8.11" (1.73 m), weight 138 lb 12.8 oz (62.959 kg).  Physical Exam:  Thin female in no acute distress. Chest decreased breath sounds bilaterally in the lower one third of the lung bases.  Abdomen:  Abdomen soft, nontender and nondistended. Incision clean dry and intact. PELVIC:  Nl EGBUS, No vaginal discharge or bleeding.  Cuff intact, no pelvic masses.  Laurette Schimke, MD 10/09/2012, 10:43 PM

## 2012-10-10 ENCOUNTER — Other Ambulatory Visit (HOSPITAL_BASED_OUTPATIENT_CLINIC_OR_DEPARTMENT_OTHER): Payer: BC Managed Care – PPO | Admitting: Lab

## 2012-10-10 ENCOUNTER — Encounter: Payer: Self-pay | Admitting: Gynecologic Oncology

## 2012-10-10 ENCOUNTER — Ambulatory Visit: Payer: BC Managed Care – PPO | Attending: Gynecologic Oncology | Admitting: Gynecologic Oncology

## 2012-10-10 VITALS — BP 128/68 | HR 78 | Temp 97.9°F | Resp 16 | Ht 68.11 in | Wt 169.8 lb

## 2012-10-10 DIAGNOSIS — C569 Malignant neoplasm of unspecified ovary: Secondary | ICD-10-CM

## 2012-10-10 DIAGNOSIS — R918 Other nonspecific abnormal finding of lung field: Secondary | ICD-10-CM | POA: Insufficient documentation

## 2012-10-10 DIAGNOSIS — Z9221 Personal history of antineoplastic chemotherapy: Secondary | ICD-10-CM | POA: Insufficient documentation

## 2012-10-10 DIAGNOSIS — C561 Malignant neoplasm of right ovary: Secondary | ICD-10-CM

## 2012-10-10 DIAGNOSIS — Z9079 Acquired absence of other genital organ(s): Secondary | ICD-10-CM | POA: Insufficient documentation

## 2012-10-10 DIAGNOSIS — Z90710 Acquired absence of both cervix and uterus: Secondary | ICD-10-CM | POA: Insufficient documentation

## 2012-10-10 LAB — CBC WITH DIFFERENTIAL/PLATELET
Basophils Absolute: 0 10*3/uL (ref 0.0–0.1)
Eosinophils Absolute: 0.1 10*3/uL (ref 0.0–0.5)
HCT: 33.2 % — ABNORMAL LOW (ref 34.8–46.6)
HGB: 11.4 g/dL — ABNORMAL LOW (ref 11.6–15.9)
LYMPH%: 29.9 % (ref 14.0–49.7)
MCV: 100.6 fL (ref 79.5–101.0)
MONO%: 10.2 % (ref 0.0–14.0)
NEUT#: 2.5 10*3/uL (ref 1.5–6.5)
Platelets: 158 10*3/uL (ref 145–400)
RDW: 14.9 % — ABNORMAL HIGH (ref 11.2–14.5)

## 2012-10-10 NOTE — Progress Notes (Signed)
Office Visit Note: Gyn-Onc  Shelley Sanchez 57 y.o. female  CC:  Ovarian cancer surveillance  Assessment/Plan:  57 y.o. with abdominal pelvic mass, ascites, and pleural effusions. CA 125 was elevated to about 2400 and CEA is normal at the time of presentation.  She underwent optimal debulking on 04/09/2012. 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  She completed six cycles dose dense  TaxolCarboplatin therapy 08/2012.  PET to assess the 2.1cm pelvic mass  Increase physical activity and decrease portion size  Followup with GYN Onc in 3 months    HPI: 57 y.o. year old G3 P3 LNMP in early 2's. In October 2013 Shelley Sanchez noted fatigue, and fecal urge incontinence. Also noted increasing abdominal girth. No change in appetite, no nausea or vomiting. Reports SOB onset in November 2013. Denies uterine bleeding, no abdominal pain just discomfort. Was evaluated in urgent care care in December 2013 for malaise and referred to Dr. Sherene Sires (Pulmonology) Thoracocentesis performed prior to Christmas and the cytology was negative for malignancy. Reported labored breathing 03/26/2012 and return of pleural effusions was noted. CT Chest/Abd/Pelvis 04/01/2012 notable for ascites so patient was admitted for evaluation. Paracentesis performed 04/01/2012. Results were negative for malignancy.  Patient reports discomfort chest heaviness And cough when lying flat. Reports 20 pound weight loss in the past year. Was able to ride 50 miles, and swam regularly until September 2013.   CT Chest Abd/Pelvis: 04/01/2012  IMPRESSION:  Large bilateral pleural effusions with extensive passive atelectasis in both lungs.  Abdomen/Pelvis: 3.7 x 3.1 cm low attenuation lesion in segment 7 of the liver. Smaller subcentimeter low attenuation lesion is also noted in segment 7 of the liver (image 53 of series 2) There is a large volume of ascites throughout the peritoneal cavity. In the  pelvis there is a complex heterogeneous appearing soft tissue mass that appears to have multiple internal cystic components and some small amounts of calcium that measures at least 12.5 x 12.7 x 11.8 cm.    Shelley Sanchez underwent exploratory laparotomy total abdominal hysterectomy bilateral salpingo-oophorectomy omentectomy radical debulking to optimal disease on 04/09/2012.   The only gross disease present was a 7 mm lesion in the posterior most aspect of the liver.  Path consistent with stage IIIc high grade serous cancer.  The patient s is doing very well. She has received  Six cycles of dose dense Taxol carboplatin therapy.  There has been a 32 pound weight gain since her initial diagnosis.  Lab Results  Component Value Date   CA125 26.2 09/17/2012   CT 10/08/2012 IMPRESSION:  1. Small subpleural pulmonary nodules noted at the left lung base. Recommend attention on future studies. The large pleural effusions have resolved.  2. Resection of large pelvic mass with a small scattered pelvic lymph nodes and a 2.1 cm soft tissue density in the right pelvis. Recommend attention on future studies.  3. Scattered retroperitoneal and mesenteric lymph nodes but no mass or omental peritoneal surface caking or nodularity.   Social Hx:  Resides with her significant other  Past Surgical Hx:  Past Surgical History  Procedure Laterality Date  . Breast enhancement surgery  2000  . Laparotomy  04/09/2012    Procedure: EXPLORATORY LAPAROTOMY;  Surgeon: Laurette Schimke, MD PHD;  Location: [facility];  Service: Gynecology;  Laterality: N/A;  EXPLORATORY LAPAROTOMY, TOTAL ABDOMINAL HYESTERCTOMY, BILATERAL SALPINGO OOPHERECTOMY  . Abdominal hysterectomy  04/09/2012    Procedure: HYSTERECTOMY ABDOMINAL;  Surgeon: Laurette Schimke, MD  PHD;  Location: [facility];  Service: Gynecology;  Laterality: N/A;    Past Medical Hx:  Past Medical History  Diagnosis Date  . Skin cancer   . Shortness of breath     bilateral pleural  effusions - s/p thoracentesis  . Ascites     s/p paracentesis  . Pelvic mass     Family Hx:  Family History  Problem Relation Age of Onset  . Melanoma Mother   . Skin cancer Brother    Physical Exam:  PHYSICAL EXAMINATION Vitals BP 128/68  Pulse 78  Temp(Src) 97.9 F (36.6 C) (Oral)  Resp 16  Ht 5' 8.11" (1.73 m)  Wt 169 lb 12.8 oz (77.021 kg)  BMI 25.73 kg/m2 Neck  Supple without any enlargements.  Cardiovascular  Pulse normal rate, regularity and rhythm. S1 and S2 normal,  Lungs  Clear to auscultation bilateraly, without wheezes/crackles/rhonchi. Good air movement.  Skin  No rash/lesions/breakdown  Psychiatry  Alert and oriented to person, place, and time  Abdomen  Normoactive bowel sounds, abdomen soft, non-tender and obese. Surgical  sites intact without evidence of hernia.  Genito Urinary  Vulva/vagina: Normal external female genitalia.  No lesions. No discharge or bleeding.  Bladder/urethra:  No lesions or masses   Vagina:  No masses no cul-de-sac nodularity  Rectal  Good tone, no masses no cul de sac nodularity.  Extremities  No bilateral cyanosis, clubbing or edema. No rash, lesions or petiche.     Laurette Schimke, MD 10/10/2012, 3:42 PM

## 2012-10-10 NOTE — Patient Instructions (Addendum)
PET scan on July 22 at 1 pm with arrival at 12:45 pm.  Nothing to eat or drink 6 hours before but you may have water.  Please call for any concerns.  PET to assess the 2.1cm pelvic mass  Increase physical activity and decrease portion size  Followup with GYN Onc in 3 months      Thank you very much Ms. Shelley Sanchez for allowing me to provide care for you today.  I appreciate your confidence in choosing our Gynecologic Oncology team.  If you have any questions about your visit today please call our office and we will get back to you as soon as possible.  [name]. Brewster MD., PhD Gynecologic Oncology

## 2012-10-15 ENCOUNTER — Encounter (HOSPITAL_COMMUNITY)
Admission: RE | Admit: 2012-10-15 | Discharge: 2012-10-15 | Disposition: A | Discharge status: Home / Self Care | Payer: BC Managed Care – PPO | Source: Ambulatory Visit | Attending: Gynecologic Oncology | Admitting: Gynecologic Oncology

## 2012-10-15 DIAGNOSIS — C569 Malignant neoplasm of unspecified ovary: Secondary | ICD-10-CM | POA: Insufficient documentation

## 2012-10-15 MED ORDER — FLUDEOXYGLUCOSE F - 18 (FDG) INJECTION
18.0000 | Freq: Once | INTRAVENOUS | Status: AC | PRN
  Administered 2012-10-15: 18 via INTRAVENOUS

## 2012-10-22 ENCOUNTER — Other Ambulatory Visit: Payer: Self-pay | Admitting: Oncology

## 2012-10-22 ENCOUNTER — Telehealth: Payer: Self-pay | Admitting: Gynecologic Oncology

## 2012-10-22 ENCOUNTER — Ambulatory Visit
Admission: RE | Admit: 2012-10-22 | Discharge: 2012-10-22 | Disposition: A | Discharge status: Home / Self Care | Payer: BC Managed Care – PPO | Source: Ambulatory Visit | Attending: Oncology | Admitting: Oncology

## 2012-10-22 DIAGNOSIS — Z1231 Encounter for screening mammogram for malignant neoplasm of breast: Secondary | ICD-10-CM

## 2012-10-22 NOTE — Telephone Encounter (Signed)
Message left asking the patient to please call the office to discuss PET scan and Dr. Forrestine Him recommendations.

## 2012-10-25 ENCOUNTER — Telehealth: Payer: Self-pay | Admitting: Hematology and Oncology

## 2012-10-25 NOTE — Telephone Encounter (Signed)
lmonvm for pt re appt for 8/26 @ 1pm lb/CP1. Schedule mailed.

## 2012-10-30 ENCOUNTER — Encounter: Payer: Self-pay | Admitting: Internal Medicine

## 2012-10-30 ENCOUNTER — Ambulatory Visit (INDEPENDENT_AMBULATORY_CARE_PROVIDER_SITE_OTHER): Payer: BC Managed Care – PPO | Admitting: Internal Medicine

## 2012-10-30 VITALS — BP 118/90 | HR 80 | Temp 97.5°F | Ht 67.75 in | Wt 166.6 lb

## 2012-10-30 DIAGNOSIS — R918 Other nonspecific abnormal finding of lung field: Secondary | ICD-10-CM

## 2012-10-30 DIAGNOSIS — J90 Pleural effusion, not elsewhere classified: Secondary | ICD-10-CM

## 2012-10-30 NOTE — Progress Notes (Signed)
  Subjective:    Patient ID: Shelley Sanchez, female    DOB: [birth year]   MRN: [id number]  HPI 73 yowf never smoker with excellent baseline activity tolerance able to cross biking in Sept of 2013 referred to pulmonary clinic 03/07/2012 by Dr Alwyn Ren at [facility] for eval of cough and abn cxr with bilateral effusions found to have ovarian ca with ascites.  03/11/12  R thoracentesis > 1.6 liter exudate/ monos/ neg cyt/ tsh slt elevated > rx synthoid 50 mcg per day but  Recurred on synthroid with ascites and ultimately dosed by Lap with IIIc ovarian ca to start chemo 05/03/12      10/30/2012 f/u ov/Keegen Heffern   Chief Complaint  Patient presents with  . Follow-up    Pt states recent PET scan 10/15/12 significant for MPN. She states overall feeling okay, has occ prod cough with clear sputum.      Sleeping ok without nocturnal  or early am exacerbation  of respiratory  c/o's or need for noct saba. Also denies any obvious fluctuation of symptoms with weather or environmental changes or other aggravating or alleviating factors except as outlined above   ROS  The following are not active complaints unless bolded sore throat, dysphagia, dental problems, itching, sneezing,  nasal congestion or excess/ purulent secretions, ear ache,   fever, chills, sweats, unintended wt loss, pleuritic or exertional cp, hemoptysis,  orthopnea pnd or leg swelling, presyncope, palpitations, heartburn, abdominal pain, anorexia, nausea, vomiting, diarrhea  or change in bowel or urinary habits, change in stools or urine, dysuria,hematuria,  rash, arthralgias, visual complaints, headache, numbness weakness or ataxia or problems with walking or coordination,  change in mood/affect or memory.           Objective:   Physical Exam  Pleasant but anxious amb wf nad   10/30/2012          166  Wt Readings from Last 3 Encounters:  05/02/12 137 lb 6.4 oz (62.324 kg)  04/30/12 133 lb 11.2 oz (60.646 kg)  04/16/12 136 lb 1.6 oz (61.735 kg)      Gen. Pleasant, well-nourished, in no distress, normal affect ENT - no lesions, no post nasal drip Neck: No JVD, no thyromegaly, no carotid bruits Lungs: no use of accessory muscles, no dullness to percussion, good air movement bilaterally Cardiovascular: Rhythm regular, heart sounds  normal, no murmurs or gallops, no peripheral edema Abdomen: soft and non-tender,protuberant, tympanic, no hepatosplenomegaly, BS normal. Musculoskeletal: No deformities, no cyanosis or clubbing Neuro:  alert, non focal   PET 10/15/12 4 mm right lower lobe pulmonary nodule with additional pleural-  based nodularity at the lung bases measuring up to 10 mm. While  non-FDG-avid, PET is considered insensitive for this evaluation.    Assessment & Plan:

## 2012-10-30 NOTE — Patient Instructions (Addendum)
The spot is way too small to consider a biopsy and is very unlikely to be a lung tumor  I recommend a follow up CT limited to this area in 3-6 months and if it's growing attempt to biopsy by IR /CT guided.

## 2012-11-02 DIAGNOSIS — R918 Other nonspecific abnormal finding of lung field: Secondary | ICD-10-CM | POA: Insufficient documentation

## 2012-11-02 NOTE — Assessment & Plan Note (Signed)
PET 10/15/12 4 mm right lower lobe pulmonary nodule with additional pleural-  based nodularity at the lung bases measuring up to 10 mm. While  non-FDG-avid, PET is considered insensitive for this evaluation.  I had an extended discussion with the patient today lasting 15 to 20 minutes of a 25 minute visit on the following issues:   These lesions are tiny and not amenable to an easy tissue and given the big picture of IIIc Ovarian ca should be considered in the context of what we do if they prove to be mets as they are very unlikely to represent any form of primary or curable lung ca even if they prove to be maligant some day.    Would simply follow them serially and refer to IR when / if clinically relevant changes occur in the nodule size to warrant a bx and only then if it would substantially change treatment plans

## 2012-11-02 NOTE — Assessment & Plan Note (Signed)
-   ESR 15/ bnp 75 03/07/2012  - Thoracentesis  R 03/07/2012 > 1.6 liters of turbid,amber fluid/ exudative/monos> cytology neg - CT chest / abd 04/01/2012 c/w ovarian mass with ascites> pos dx ovarian ca IIIc 04/09/12 - L thoracentesis 04/05/12 > 2liters > neg cytology  No evidence of recurrence

## 2012-11-12 ENCOUNTER — Telehealth: Payer: Self-pay | Admitting: Oncology

## 2012-11-12 NOTE — Telephone Encounter (Signed)
, °

## 2012-11-14 ENCOUNTER — Other Ambulatory Visit (HOSPITAL_BASED_OUTPATIENT_CLINIC_OR_DEPARTMENT_OTHER): Payer: BC Managed Care – PPO | Admitting: Lab

## 2012-11-14 ENCOUNTER — Ambulatory Visit: Payer: BC Managed Care – PPO | Attending: Oncology | Admitting: Oncology

## 2012-11-14 ENCOUNTER — Encounter: Payer: Self-pay | Admitting: Oncology

## 2012-11-14 ENCOUNTER — Telehealth: Payer: Self-pay | Admitting: Oncology

## 2012-11-14 VITALS — BP 106/69 | HR 61 | Temp 98.4°F | Resp 20 | Ht 67.5 in | Wt 164.0 lb

## 2012-11-14 DIAGNOSIS — C561 Malignant neoplasm of right ovary: Secondary | ICD-10-CM

## 2012-11-14 DIAGNOSIS — C569 Malignant neoplasm of unspecified ovary: Secondary | ICD-10-CM

## 2012-11-14 DIAGNOSIS — R918 Other nonspecific abnormal finding of lung field: Secondary | ICD-10-CM

## 2012-11-14 DIAGNOSIS — G609 Hereditary and idiopathic neuropathy, unspecified: Secondary | ICD-10-CM

## 2012-11-14 LAB — COMPREHENSIVE METABOLIC PANEL (CC13)
ALT: 16 U/L (ref 0–55)
Albumin: 4 g/dL (ref 3.5–5.0)
CO2: 26 mEq/L (ref 22–29)
Calcium: 9.2 mg/dL (ref 8.4–10.4)
Chloride: 106 mEq/L (ref 98–109)
Potassium: 4.4 mEq/L (ref 3.5–5.1)
Sodium: 141 mEq/L (ref 136–145)
Total Bilirubin: 0.65 mg/dL (ref 0.20–1.20)
Total Protein: 7 g/dL (ref 6.4–8.3)

## 2012-11-14 LAB — CBC WITH DIFFERENTIAL/PLATELET
Basophils Absolute: 0 10*3/uL (ref 0.0–0.1)
Eosinophils Absolute: 0 10*3/uL (ref 0.0–0.5)
HGB: 12.1 g/dL (ref 11.6–15.9)
MONO#: 0.3 10*3/uL (ref 0.1–0.9)
NEUT#: 1.7 10*3/uL (ref 1.5–6.5)
RBC: 3.68 10*6/uL — ABNORMAL LOW (ref 3.70–5.45)
RDW: 13.4 % (ref 11.2–14.5)
WBC: 3 10*3/uL — ABNORMAL LOW (ref 3.9–10.3)
lymph#: 0.9 10*3/uL (ref 0.9–3.3)

## 2012-11-14 NOTE — Telephone Encounter (Signed)
gv pt appt schedule for oct lb/Dr. Nelly Rout. Per 8/21 pof move 9/2 appt w/Dr. Nelly Rout to late October and add lb. Also schedule lb/LL for late January. Schedule for LL in January not available yet. Copy of 8/21 pof given to Kim in gynonc to add to pt's chart and pt will be given January lb/LL at October appt w/Dr. Nelly Rout.

## 2012-11-14 NOTE — Progress Notes (Signed)
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 She is feeling progressively better overall since completion of treatment, now walking 3-4 miles daily and back working full time. Last CT AP was 10-08-12, followed by PET on 10-15-12. She saw Dr Nelly Rout on 10-10-12 with gyn onc follow up requested in 3 months from that visit; she saw Dr Sherene Sires after the scans due to tiny pulmonary nodules of unclear etiology, serial follow up recommended. Most recent CA 125 was 26 on 09-17-12, pending again today. She does not have PAC.  ONCOLOGIC HISTORY  Patient had last gyn exam spring 2013 in [place]. She felt in usual excellent health thru Sept 2013,very athletic including 50 mile biking at [employer] in Sept, also swimming and hiking. In Oct 2013 she was more fatigued, with some fecal urge incontinence and abdominal fullness such that she began to diet to lose weight. By Fabienne Bruns she could not climb one flight of stairs and could walk only from office to car without SOB and coughing. She was seen at urgent care in Dec with bilateral pleural effusions on CXR, referred to Dr Francella Solian, with 1.6 liter right thoracentesis done 03-11-12 (no path in this EMR). She had CT CAP and CT angio chest with large bilateral pleural effusions, no PE, ascites and pelvic mass (see below). She was admitted 1-6 thru 04-06-12, with US paracentesis for 1.4 liters on 04-02-12 and left thoracentesis for 2 liters on 04-05-12. CA 125 was 2418. She was seen in consultation in hospital by Dr Nelly Rout on 04-02-12, then readmitted for surgery by gyn oncology on 04-09-12. The procedure by Dr Nelly Rout was exploratory laparotomy with TAH/BSO, radical debulking and omentectomy, which was optimal debulking with only residual at  completion of procedure being 7 mm area on posterior surface of right lobe liver. She was transfused 2 units PRBCs on 04-10-12 for Hgb down to 7.2 (from 12 preop). Post operative course otherwise was not remarkable and she was DC home on 04-13-12. Pathology [id number]) high grade serous carcinoma involving right pelvic mass, serosa of uterus and peritoneum, omentum negative, no nodes submitted. Adjuvant therapy with dose dense taxol/ carboplatin began 05-03-12, with CA125 down to 30 by early April. Cycle 6 was completed 09-10-12.    Review of systems as above, also: progressive improvement in exercise tolerance tho still not walking 15 min miles as she had done prior to illness. No respiratory symptoms. Appetite less now off all steroids. No bleeding. Bowels moving regularly and no other GI complaints. 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 No infectious illness. Some minimal puffiness LE at times. Not comfortable at present weight, with ~ 33 lb weight gain during chemo, good weight ~ 135 - 140 lbs. Slight peripheral neuropathy in right fingertips preceded chemo  Remainder of 10 point Review of Systems negative.  Objective:  Vital signs in last 24 hours:  BP 106/69  Pulse 61  Temp(Src) 98.4 F (36.9 C) (Oral)  Resp 20  Ht 5' 7.5" (1.715 m)  Wt 164 lb (74.39 kg)  BMI 25.29 kg/m2  Easily ambulatory, bright and talkative, very pleasant as always  HEENT:PERRL, sclera clear, anicteric and oropharynx clear, no lesions. Hair is growing back. Neck supple without JVD Lymphatics cervical,suraclavicular, axillary or inguinal adenopathy Resp: clear to auscultation bilaterally and  normal percussion bilaterally Cardio: regular rate and rhythm GI: soft, nontender, not distended, no mass or organomegaly Extremities: without pitting edema, cords, tenderness including LE bilaterally Neuro: nonfocal Skin without rash or ecchymosis and no problems residual at sites of peripheral IVs in UE  Lab  Results:  Results for orders placed in visit on 11/14/12  CBC WITH DIFFERENTIAL      Result Value Range   WBC 3.0 (*) 3.9 - 10.3 10e3/uL   NEUT# 1.7  1.5 - 6.5 10e3/uL   HGB 12.1  11.6 - 15.9 g/dL   HCT 82.9  56.2 - 13.0 %   Platelets 148  145 - 400 10e3/uL   MCV 95.4  79.5 - 101.0 fL   MCH 32.8  25.1 - 34.0 pg   MCHC 34.4  31.5 - 36.0 g/dL   RBC 8.65 (*) 7.84 - 6.96 10e6/uL   RDW 13.4  11.2 - 14.5 %   lymph# 0.9  0.9 - 3.3 10e3/uL   MONO# 0.3  0.1 - 0.9 10e3/uL   Eosinophils Absolute 0.0  0.0 - 0.5 10e3/uL   Basophils Absolute 0.0  0.0 - 0.1 10e3/uL   NEUT% 57.8  38.4 - 76.8 %   LYMPH% 31.7  14.0 - 49.7 %   MONO% 8.4  0.0 - 14.0 %   EOS% 1.6  0.0 - 7.0 %   BASO% 0.5  0.0 - 2.0 %  COMPREHENSIVE METABOLIC PANEL (CC13)      Result Value Range   Sodium 141  136 - 145 mEq/L   Potassium 4.4  3.5 - 5.1 mEq/L   Chloride 106  98 - 109 mEq/L   CO2 26  22 - 29 mEq/L   Glucose 96  70 - 140 mg/dl   BUN 29.5  7.0 - 28.4 mg/dL   Creatinine 0.8  0.6 - 1.1 mg/dL   Total Bilirubin 1.32  0.20 - 1.20 mg/dL   Alkaline Phosphatase 58  40 - 150 U/L   AST 16  5 - 34 U/L   ALT 16  0 - 55 U/L   Total Protein 7.0  6.4 - 8.3 g/dL   Albumin 4.0  3.5 - 5.0 g/dL   Calcium 9.2  8.4 - 44.0 mg/dL    CA 102 available after visit 18.3 and we will let patient know this result   Studies/Results:  all reviewed in PACS by this MD   CT ABDOMEN AND PELVIS WITH CONTRAST 10-08-12 Technique: Multidetector CT imaging of the abdomen and pelvis was  performed following the standard protocol during bolus  administration of intravenous contrast.  Contrast: OMNIPAQUE IOHEXOL 300 MG/ML SOLN  Comparison: 04/01/2012.  Findings: The lung bases demonstrate small subpleural pulmonary  nodules just above the left hemidiaphragm. Recommend attention on  future studies. No pleural effusions.  The liver demonstrates stable cysts. No worrisome hepatic lesions  or intrahepatic biliary dilatation. The gallbladder is  normal. No  common bile duct dilatation. The pancreas is normal. The spleen  is normal in size. No focal lesions. The adrenal glands and  kidneys are normal and stable.  The stomach, duodenum, small bowel and colon are unremarkable. No  inflammatory changes or mass lesions. No obstructive findings.  The aorta is normal in caliber. The major branch vessels are  patent. Small scattered mesenteric and retroperitoneal lymph nodes  but no mass or overt adenopathy. No omental caking or nodularity.  No findings to suggest peritoneal surface disease.  The large pelvic mass has been resected. Small  scattered pelvic  lymph nodes are noted without overt mass or adenopathy. There is  an ill-defined soft tissue density in the right pelvis on image  number 67 measuring 2.1 cm. Recommend attention on future studies.  No inguinal mass or adenopathy.  The bony structures are unremarkable. There is a stable mixed  lytic and sclerotic lesion in the left iliac bone which this most  likely a focus of fibrous dysplasia or other benign lesion.  IMPRESSION:  1. Small subpleural pulmonary nodules noted at the left lung base.  Recommend attention on future studies. The large pleural effusions  have resolved.  2. Resection of large pelvic mass with a small scattered pelvic  lymph nodes and a 2.1 cm soft tissue density in the right pelvis.  Recommend attention on future studies.  3. Scattered retroperitoneal and mesenteric lymph nodes but no  mass or omental peritoneal surface caking or nodularity.   NUCLEAR MEDICINE PET SKULL BASE TO THIGH 10-15-12 Fasting Blood Glucose: 93  Technique: 18.0 mCi F-18 FDG was injected intravenously. CT data  was obtained and used for attenuation correction and anatomic  localization only. (This was not acquired as a diagnostic CT  examination.) Additional exam technical data entered on  technologist worksheet.  Comparison: CT abdomen pelvis dated 10/08/2012. CTA chest dated   04/01/2012.  Findings:  Neck: No hypermetabolic lymph nodes in the neck.  Chest: 4 mm right lower lobe pulmonary nodule (series 2/image  106), non-FDG-avid but below the size threshold for PET  sensitivity.  Additional pleural-based nodularity at the lung bases, measuring 5  mm on the right (series 2/image 112) and up to 10 mm on the left  (series 2/image 113). While non-FDG-avid, PET can be insensitive  in the evaluation of lung base abnormalities due to motion  artifact.  No hypermetabolic mediastinal or hilar nodes.  Abdomen/Pelvis: 2.8 x 4.1 cm bilobed cyst in the posterior segment  right hepatic lobe (series 2/image 124), non-FDG-avid.  No abnormal hypermetabolic activity within the pancreas, adrenal  glands, or spleen.  Status post hysterectomy and bilateral salpingo-oophorectomy.  Trace pelvic stranding/ascites (series 2/image 222). No gross  peritoneal disease.  Small retroperitoneal/left iliac chain nodes measuring up to 8 mm  short axis, non-FDG-avid. No hypermetabolic lymph nodes in the  abdomen or pelvis.  The right pelvic abnormality noted on prior CT is non-FDG-avid and  is favored to reflect a mildly prominent pelvic vessel (series  2/image 216) rather than a soft tissue mass. Note is made of a  similar appearance on the left (series 2/image 223).  Skeleton: No focal hypermetabolic activity to suggest skeletal  metastasis.  IMPRESSION:  Status post hysterectomy and bilateral salpingo-oophorectomy.  Trace pelvic stranding/ascites. No gross peritoneal disease.  Right pelvic abnormality noted on prior CT is non-FDG-avid and  favored to reflect a mildly prominent pelvic vessel rather than a  soft tissue mass.  4 mm right lower lobe pulmonary nodule with additional pleural-  based nodularity at the lung bases measuring up to 10 mm. While  non-FDG-avid, PET is considered insensitive for this evaluation.  Attention on follow-up is suggested.    DIGITAL SCREENING  BILATERAL MAMMOGRAM WITH IMPLANTS AND CAD      [facility] The patient has subpectoral implants. Standard and implant  displaced views were performed.  Comparison: Previous exam(s).  FINDINGS:  ACR Breast Density Category b: There are scattered areas of  fibroglandular density.  There are no findings suspicious for malignancy.  Images were processed with CAD.  IMPRESSION:  No mammographic evidence of malignancy.  A result letter of this screening mammogram will be mailed directly  to the patient.  RECOMMENDATION:  Screening mammogram in one year.    Medications: I have reviewed the patient's current medications.  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 No clear residual or recurrent disease on recent CT AP and PET, with CA 125 in good range. Follow up with Dr Nelly Rout with labs in Oct and with Dr Darrold Span with labs in Jan, or sooner if needed. 2. 4 mm RLL pulmonary nodule and bilateral pleural based nodularity 5 mm on right and 10 mm on left. Will follow with repeat scans to be set up at appropriate interval when she is seen back. Appreciate Dr Thurston Hole recommendations. 3.anemia improving   4. mammograms done as above.  5.no colonoscopy: I have asked her to discuss timing of this procedure with Dr Nelly Rout and I have not made referral to GI yet.  6.weight gain thru treatment, related to steroids. She has resumed walking for exercise and has already had intentional loss of ~ 5 lbs. 7.slight preexisting peripheral neuropathy fingertips right, no worse with taxol      Jomo Forand P, MD   11/14/2012, 5:41 PM

## 2012-11-15 LAB — CA 125: CA 125: 18.3 U/mL (ref 0.0–30.2)

## 2012-11-18 ENCOUNTER — Telehealth: Payer: Self-pay | Admitting: *Deleted

## 2012-11-18 NOTE — Telephone Encounter (Signed)
Pt called back, gave CA 125 results

## 2012-11-18 NOTE — Telephone Encounter (Signed)
Message copied by Phillis Knack on Mon Nov 18, 2012 10:29 AM ------      Message from: Reece Packer      Created: Sat Nov 16, 2012  3:50 PM       Please let her know ca 125 was good at 18 from visit last week            Cc TH, LA ------

## 2012-11-18 NOTE — Telephone Encounter (Signed)
Left message to call us

## 2012-11-19 ENCOUNTER — Other Ambulatory Visit: Payer: BC Managed Care – PPO | Admitting: Lab

## 2012-11-19 ENCOUNTER — Ambulatory Visit: Payer: BC Managed Care – PPO

## 2012-11-26 ENCOUNTER — Ambulatory Visit: Payer: BC Managed Care – PPO | Admitting: Gynecologic Oncology

## 2013-01-14 ENCOUNTER — Ambulatory Visit: Payer: BC Managed Care – PPO | Attending: Gynecologic Oncology | Admitting: Gynecologic Oncology

## 2013-01-14 ENCOUNTER — Other Ambulatory Visit (HOSPITAL_BASED_OUTPATIENT_CLINIC_OR_DEPARTMENT_OTHER): Payer: BC Managed Care – PPO | Admitting: Lab

## 2013-01-14 ENCOUNTER — Encounter (INDEPENDENT_AMBULATORY_CARE_PROVIDER_SITE_OTHER): Payer: Self-pay

## 2013-01-14 ENCOUNTER — Encounter: Payer: Self-pay | Admitting: Gynecologic Oncology

## 2013-01-14 VITALS — BP 108/65 | HR 76 | Temp 97.9°F | Resp 16 | Ht 68.0 in | Wt 162.6 lb

## 2013-01-14 DIAGNOSIS — Z9079 Acquired absence of other genital organ(s): Secondary | ICD-10-CM | POA: Insufficient documentation

## 2013-01-14 DIAGNOSIS — R918 Other nonspecific abnormal finding of lung field: Secondary | ICD-10-CM | POA: Insufficient documentation

## 2013-01-14 DIAGNOSIS — C569 Malignant neoplasm of unspecified ovary: Secondary | ICD-10-CM

## 2013-01-14 DIAGNOSIS — C561 Malignant neoplasm of right ovary: Secondary | ICD-10-CM

## 2013-01-14 DIAGNOSIS — Z9221 Personal history of antineoplastic chemotherapy: Secondary | ICD-10-CM | POA: Insufficient documentation

## 2013-01-14 DIAGNOSIS — R635 Abnormal weight gain: Secondary | ICD-10-CM | POA: Insufficient documentation

## 2013-01-14 DIAGNOSIS — Z90710 Acquired absence of both cervix and uterus: Secondary | ICD-10-CM | POA: Insufficient documentation

## 2013-01-14 LAB — COMPREHENSIVE METABOLIC PANEL (CC13)
ALT: 14 U/L (ref 0–55)
Alkaline Phosphatase: 71 U/L (ref 40–150)
CO2: 28 mEq/L (ref 22–29)
Sodium: 142 mEq/L (ref 136–145)
Total Bilirubin: 0.63 mg/dL (ref 0.20–1.20)
Total Protein: 7.3 g/dL (ref 6.4–8.3)

## 2013-01-14 LAB — CBC WITH DIFFERENTIAL/PLATELET
BASO%: 0.6 % (ref 0.0–2.0)
LYMPH%: 28 % (ref 14.0–49.7)
MCHC: 34.5 g/dL (ref 31.5–36.0)
MONO#: 0.4 10*3/uL (ref 0.1–0.9)
Platelets: 166 10*3/uL (ref 145–400)
RBC: 3.99 10*6/uL (ref 3.70–5.45)
RDW: 13.5 % (ref 11.2–14.5)
WBC: 4.1 10*3/uL (ref 3.9–10.3)

## 2013-01-14 NOTE — Progress Notes (Signed)
Office Visit Note: Gyn-Onc  Shelley Sanchez 57 y.o. female  CC:  Ovarian cancer surveillance  Assessment/Plan:  57 y.o. with abdominal pelvic mass, ascites, and pleural effusions. CA 125 was elevated to about 2400 and CEA is normal at the time of presentation.  She underwent optimal debulking on 04/09/2012. 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  She completed six cycles dose dense  TaxolCarboplatin therapy 08/2012.  Increase physical activity and decrease portion size  Followup with GYN Onc in 3 months    HPI: 57 y.o. year old G3 P3 LNMP in early 75's. In October 2013 Shelley Sanchez noted fatigue, and fecal urge incontinence. Also noted increasing abdominal girth. No change in appetite, no nausea or vomiting. Reports SOB onset in November 2013. Denies uterine bleeding, no abdominal pain just discomfort. Was evaluated in urgent care care in December 2013 for malaise and referred to Dr. Sherene Sires (Pulmonology) Thoracocentesis performed prior to Christmas and the cytology was negative for malignancy. Reported labored breathing 03/26/2012 and a thoracocentesis was performed. There was  return of pleural effusions was noted. CT Chest/Abd/Pelvis 04/01/2012 notable for ascites so patient was admitted for evaluation. Paracentesis performed 04/01/2012. Results were negative for malignancy. 25 at the time of presentation was 2418.  CT Chest Abd/Pelvis: 04/01/2012  IMPRESSION:  Large bilateral pleural effusions with extensive passive atelectasis in both lungs.  Abdomen/Pelvis: 3.7 x 3.1 cm low attenuation lesion in segment 7 of the liver. Smaller subcentimeter low attenuation lesion is also noted in segment 7 of the liver (image 53 of series 2) There is a large volume of ascites throughout the peritoneal cavity. In the pelvis there is a complex heterogeneous appearing soft tissue mass that appears to have multiple internal cystic components and some small amounts  of calcium that measures at least 12.5 x 12.7 x 11.8 cm.   Shelley Sanchez underwent exploratory laparotomy total abdominal hysterectomy bilateral salpingo-oophorectomy omentectomy radical debulking to optimal disease on 04/09/2012.  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 The only gross disease present was a 7 mm lesion in the posterior most aspect of the liver.  Path consistent with stage IIIc high grade serous cancer.  She received  six cycles of dose dense Taxol carboplatin therapy completed 09/10/2012  CT 10/08/2012 IMPRESSION:  1. Small subpleural pulmonary nodules noted at the left lung base. Recommend attention on future studies. The large pleural effusions have resolved.  2. Resection of large pelvic mass with a small scattered pelvic lymph nodes and a 2.1 cm soft tissue density in the right pelvis. Recommend attention on future studies.  3. Scattered retroperitoneal and mesenteric lymph nodes but no mass or omental peritoneal surface caking or nodularity.   PET/CT on 10/15/2012 Trace pelvic stranding/ascites. No gross peritoneal disease. Right pelvic abnormality noted on prior CT is non-FDG-avid and favored to reflect a mildly prominent pelvic vessel rather than a soft tissue mass. 4 mm right lower lobe pulmonary nodule with additional pleural- based nodularity at the lung bases measuring up to 10 mm. While non-FDG-avid, PET is considered insensitive for this evaluation.  Ms. Spidle has seen Dr. Wyn Quaker for evaluation of the pulmonary nodules. Serial followup has been recommended   Reports weight gain.  No nausea vomiting, no bloating.  Neuropathy is worse in the morning.  Lab Results  Component Value Date   CA125 18.3 11/14/2012   Social Hx:  Resides with her significant other  Past Surgical Hx:  Past Surgical History  Procedure Laterality Date  . Breast enhancement surgery  2000  . Laparotomy  04/09/2012    Procedure:  EXPLORATORY LAPAROTOMY;  Surgeon: Laurette Schimke, MD PHD;  Location: [facility];  Service: Gynecology;  Laterality: N/A;  EXPLORATORY LAPAROTOMY, TOTAL ABDOMINAL HYESTERCTOMY, BILATERAL SALPINGO OOPHERECTOMY  . Abdominal hysterectomy  04/09/2012    Procedure: HYSTERECTOMY ABDOMINAL;  Surgeon: Laurette Schimke, MD PHD;  Location: [facility];  Service: Gynecology;  Laterality: N/A;    Past Medical Hx:  Past Medical History  Diagnosis Date  . Skin cancer   . Shortness of breath     bilateral pleural effusions - s/p thoracentesis  . Ascites     s/p paracentesis  . Pelvic mass     Family Hx:  Family History  Problem Relation Age of Onset  . Melanoma Mother   . Skin cancer Brother    PHYSICAL EXAMINATION Vitals BP 108/65  Pulse 76  Temp(Src) 97.9 F (36.6 C) (Oral)  Resp 16  Ht 5\' 8"  (1.727 m)  Wt 162 lb 9.6 oz (73.755 kg)  BMI 24.73 kg/m2 Wt Readings from Last 3 Encounters:  01/14/13 162 lb 9.6 oz (73.755 kg)  11/14/12 164 lb (74.39 kg)  10/30/12 166 lb 9.6 oz (75.569 kg)   Neck  Supple without any enlargements.  Cardiovascular  Pulse normal rate, regularity and rhythm. S1 and S2 normal,  Lungs  Clear to auscultation bilateraly, without wheezes/crackles/rhonchi.  Skin  No rash/lesions/breakdown  Psychiatry  Alert and oriented to person, place, and time  Abdomen  Normoactive bowel sounds, abdomen soft, non-tender and obese. Surgical  sites intact without evidence of hernia.  Genito Urinary  Vulva/vagina: Normal external female genitalia.  No lesions. No discharge or bleeding.  Bladder/urethra:  No lesions or masses  Vagina:  No masses no cul-de-sac nodularity, atrophic, no nodularity no cul de sac masses Rectal  Good tone, no masses no cul de sac nodularity.  Extremities  No bilateral cyanosis, clubbing or edema. No rash, lesions or petiche.     Laurette Schimke, MD 01/14/2013, 3:43 PM

## 2013-01-14 NOTE — Patient Instructions (Signed)
F/U with Dr. Darrold Span 03/2013 F/U with GYN ONC 06/2013  Exercise to Lose Weight Exercise and a healthy diet may help you lose weight. Your doctor may suggest specific exercises. EXERCISE IDEAS AND TIPS  Choose low-cost things you enjoy doing, such as walking, bicycling, or exercising to workout videos.  Take stairs instead of the elevator.  Walk during your lunch break.  Park your car further away from work or school.  Go to a gym or an exercise class.  Start with 5 to 10 minutes of exercise each day. Build up to 30 minutes of exercise 4 to 6 days a week.  Wear shoes with good support and comfortable clothes.  Stretch before and after working out.  Work out until you breathe harder and your heart beats faster.  Drink extra water when you exercise.  Do not do so much that you hurt yourself, feel dizzy, or get very short of breath. Exercises that burn about 150 calories:  Running 1  miles in 15 minutes.  Playing volleyball for 45 to 60 minutes.  Washing and waxing a car for 45 to 60 minutes.  Playing touch football for 45 minutes.  Walking 1  miles in 35 minutes.  Pushing a stroller 1  miles in 30 minutes.  Playing basketball for 30 minutes.  Raking leaves for 30 minutes.  Bicycling 5 miles in 30 minutes.  Walking 2 miles in 30 minutes.  Dancing for 30 minutes.  Shoveling snow for 15 minutes.  Swimming laps for 20 minutes.  Walking up stairs for 15 minutes.  Bicycling 4 miles in 15 minutes.  Gardening for 30 to 45 minutes.  Jumping rope for 15 minutes.  Washing windows or floors for 45 to 60 minutes. Document Released: 04/15/2010 Document Revised: 06/05/2011 Document Reviewed: 04/15/2010 [facility] Patient Information 2014 [place], [place].   Thank you very much Ms. Charles Niese for allowing me to provide care for you today.  I appreciate your confidence in choosing our Gynecologic Oncology team.  If you have any questions about your visit today please  call our office and we will get back to you as soon as possible.  [name]. Octavion Mollenkopf MD., PhD Gynecologic Oncology

## 2013-01-15 ENCOUNTER — Telehealth: Payer: Self-pay

## 2013-01-15 NOTE — Telephone Encounter (Signed)
Message copied by Lorine Bears on Wed Jan 15, 2013  1:09 PM ------      Message from: Jama Flavors P      Created: Wed Jan 15, 2013 12:31 PM       Labs seen and need follow up: please let her know chemistries and blood counts all good on 01-14-13            Cc LA, TH ------

## 2013-01-15 NOTE — Telephone Encounter (Signed)
Left a messaged in patient's vm regarding lab results noted below by Dr. Darrold Span.

## 2013-04-10 ENCOUNTER — Telehealth: Payer: Self-pay | Admitting: Oncology

## 2013-04-10 NOTE — Telephone Encounter (Signed)
returned pt call and lvm regarding to 1.28.15 appt..mailed pt appt sched.avs and letter

## 2013-04-17 ENCOUNTER — Other Ambulatory Visit: Payer: Self-pay | Admitting: Oncology

## 2013-04-17 DIAGNOSIS — C561 Malignant neoplasm of right ovary: Secondary | ICD-10-CM

## 2013-04-23 ENCOUNTER — Telehealth: Payer: Self-pay | Admitting: Oncology

## 2013-04-23 ENCOUNTER — Encounter: Payer: Self-pay | Admitting: Oncology

## 2013-04-23 ENCOUNTER — Ambulatory Visit (HOSPITAL_BASED_OUTPATIENT_CLINIC_OR_DEPARTMENT_OTHER): Payer: BC Managed Care – PPO | Admitting: Oncology

## 2013-04-23 ENCOUNTER — Other Ambulatory Visit (HOSPITAL_BASED_OUTPATIENT_CLINIC_OR_DEPARTMENT_OTHER): Payer: BC Managed Care – PPO

## 2013-04-23 VITALS — BP 116/62 | HR 60 | Temp 96.7°F | Resp 20 | Ht 68.0 in | Wt 162.2 lb

## 2013-04-23 DIAGNOSIS — C561 Malignant neoplasm of right ovary: Secondary | ICD-10-CM

## 2013-04-23 DIAGNOSIS — G5690 Unspecified mononeuropathy of unspecified upper limb: Secondary | ICD-10-CM

## 2013-04-23 DIAGNOSIS — G5790 Unspecified mononeuropathy of unspecified lower limb: Secondary | ICD-10-CM

## 2013-04-23 DIAGNOSIS — C569 Malignant neoplasm of unspecified ovary: Secondary | ICD-10-CM

## 2013-04-23 LAB — COMPREHENSIVE METABOLIC PANEL (CC13)
ALBUMIN: 4.8 g/dL (ref 3.5–5.0)
ALK PHOS: 70 U/L (ref 40–150)
ALT: 15 U/L (ref 0–55)
AST: 14 U/L (ref 5–34)
Anion Gap: 11 mEq/L (ref 3–11)
BUN: 14.5 mg/dL (ref 7.0–26.0)
CALCIUM: 9.9 mg/dL (ref 8.4–10.4)
CHLORIDE: 103 meq/L (ref 98–109)
CO2: 27 meq/L (ref 22–29)
Creatinine: 0.9 mg/dL (ref 0.6–1.1)
GLUCOSE: 100 mg/dL (ref 70–140)
POTASSIUM: 4.2 meq/L (ref 3.5–5.1)
SODIUM: 142 meq/L (ref 136–145)
TOTAL PROTEIN: 7.9 g/dL (ref 6.4–8.3)
Total Bilirubin: 0.66 mg/dL (ref 0.20–1.20)

## 2013-04-23 LAB — CBC WITH DIFFERENTIAL/PLATELET
BASO%: 0.4 % (ref 0.0–2.0)
BASOS ABS: 0 10*3/uL (ref 0.0–0.1)
EOS ABS: 0.1 10*3/uL (ref 0.0–0.5)
EOS%: 1.5 % (ref 0.0–7.0)
HCT: 40.8 % (ref 34.8–46.6)
HEMOGLOBIN: 13.9 g/dL (ref 11.6–15.9)
LYMPH#: 1.4 10*3/uL (ref 0.9–3.3)
LYMPH%: 32.4 % (ref 14.0–49.7)
MCH: 32.7 pg (ref 25.1–34.0)
MCHC: 34.1 g/dL (ref 31.5–36.0)
MCV: 96.1 fL (ref 79.5–101.0)
MONO#: 0.3 10*3/uL (ref 0.1–0.9)
MONO%: 6.3 % (ref 0.0–14.0)
NEUT%: 59.4 % (ref 38.4–76.8)
NEUTROS ABS: 2.6 10*3/uL (ref 1.5–6.5)
Platelets: 169 10*3/uL (ref 145–400)
RBC: 4.24 10*6/uL (ref 3.70–5.45)
RDW: 13.2 % (ref 11.2–14.5)
WBC: 4.4 10*3/uL (ref 3.9–10.3)

## 2013-04-23 NOTE — Telephone Encounter (Signed)
, °

## 2013-04-23 NOTE — Progress Notes (Signed)
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 She saw Dr Skeet Latch in 12-2012 and will see her again in 06-2013; last scans were CT AP and PET in 09-2012. 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 She has begun exercising at gym, now walking 4 miles in an hour, but not yet back to biking. She does not have PAC.  Otherwise, she denies abdominal or pelvic discomfort. Appetite is excellent, no respiratory symptoms, no bleeding. No problems with bowels. She is back working full time from home.  The last CT had small subpleural pulmonary nodules at left lung base, reviewed by Dr Melvyn Novas then.  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     Ovarian cancer on right   05/21/2012 Initial Diagnosis Ovarian cancer on right  Patient was in usual excellent health thru Sept 2013,then in Oct 2013  more fatigued, with some fecal urge incontinence and abdominal fullness such that she began to diet to lose weight. By Marcene Duos she could not climb one flight of stairs and could walk only from office to car without SOB and coughing. She was seen at urgent care in Dec with bilateral pleural effusions on CXR, referred to Dr Clois Comber, with 1.6 liter right thoracentesis done 03-11-12 (no path in this EMR). She had CT CAP and CT angio chest with large bilateral pleural effusions, no PE, ascites and pelvic mass. She had  paracentesis for 1.4 liters on 04-02-12 and left thoracentesis for 2 liters on 04-05-12. CA 125 was 2418. She was seen in  consultation by Dr Skeet Latch, with TAH/BSO, radical debulking and omentectomy on 04-09-2012, which was optimal debulking with only residual at completion of procedure being 7 mm area on posterior surface of right lobe liver. She was transfused 2 units PRBCs on 04-10-12 for Hgb down to 7.2 (from 12 preop).  Pathology [phone]) high grade serous carcinoma involving right pelvic mass, serosa of uterus and peritoneum, omentum negative, no nodes submitted. Adjuvant therapy with dose dense taxol/ carboplatin began 05-03-12, with CA125 down to 30 by early April. Cycle 6 was completed 09-10-12.   Review of systems as above, also: No recent infectious illness. No SOB, cough, chest pain.  Remainder of 10 point Review of Systems negative.  Objective:  Vital signs in last 24 hours:  BP 116/62  Pulse 60  Temp(Src) 96.7 F (35.9 C) (Oral)  Resp 20  Ht 5\' 8"  (1.727 m)  Wt 162 lb 3.2 oz (73.573 kg)  BMI 24.67 kg/m2 Weight is stable from last visit, but up 20 lbs from good weight prior to the cancer diagnosis. Alert, oriented and appropriate. Quickly and easily ambulatory.  HEENT:PERRL, sclerae not icteric. Oral mucosa moist without lesions, posterior pharynx clear. Hair has grown back. Neck supple. No JVD.  Lymphatics:no cervical,suraclavicular, axillary or inguinal adenopathy Resp: clear to auscultation bilaterally and normal percussion bilaterally Cardio: regular rate and rhythm. No gallop. GI: soft, nontender, not distended, no mass or organomegaly. Normally active bowel sounds. Surgical incision not remarkable. Musculoskeletal/ Extremities: without pitting edema, cords, tenderness Neuro: no peripheral neuropathy. Otherwise nonfocal Skin without  rash, ecchymosis, petechiae Breasts: augmentation implants, otherwise without dominant mass, skin or nipple findings. Axillae benign.   Lab Results:  Results for orders placed in visit on 04/23/13  CBC WITH DIFFERENTIAL      Result Value Range   WBC 4.4   3.9 - 10.3 10e3/uL   NEUT# 2.6  1.5 - 6.5 10e3/uL   HGB 13.9  11.6 - 15.9 g/dL   HCT 40.8  34.8 - 46.6 %   Platelets 169  145 - 400 10e3/uL   MCV 96.1  79.5 - 101.0 fL   MCH 32.7  25.1 - 34.0 pg   MCHC 34.1  31.5 - 36.0 g/dL   RBC 4.24  3.70 - 5.45 10e6/uL   RDW 13.2  11.2 - 14.5 %   lymph# 1.4  0.9 - 3.3 10e3/uL   MONO# 0.3  0.1 - 0.9 10e3/uL   Eosinophils Absolute 0.1  0.0 - 0.5 10e3/uL   Basophils Absolute 0.0  0.0 - 0.1 10e3/uL   NEUT% 59.4  38.4 - 76.8 %   LYMPH% 32.4  14.0 - 49.7 %   MONO% 6.3  0.0 - 14.0 %   EOS% 1.5  0.0 - 7.0 %   BASO% 0.4  0.0 - 2.0 %  COMPREHENSIVE METABOLIC PANEL (OX73)      Result Value Range   Sodium 142  136 - 145 mEq/L   Potassium 4.2  3.5 - 5.1 mEq/L   Chloride 103  98 - 109 mEq/L   CO2 27  22 - 29 mEq/L   Glucose 100  70 - 140 mg/dl   BUN 14.5  7.0 - 26.0 mg/dL   Creatinine 0.9  0.6 - 1.1 mg/dL   Total Bilirubin 0.66  0.20 - 1.20 mg/dL   Alkaline Phosphatase 70  40 - 150 U/L   AST 14  5 - 34 U/L   ALT 15  0 - 55 U/L   Total Protein 7.9  6.4 - 8.3 g/dL   Albumin 4.8  3.5 - 5.0 g/dL   Calcium 9.9  8.4 - 10.4 mg/dL   Anion Gap 11  3 - 11 mEq/L    CA 125 available after visit 13.1, having been 18 in 10-2012.   Studies/Results:   CT and PET 09-2012 as above. [facility]  Medications: I have reviewed the patient's current medications. She did have flu vaccine and is aware that she should have Tamiflu if influenza symptoms.    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 Clinically doing well, with nothing of concern for recurrent disease now. She will see Dr Skeet Latch in 06-2013 with CA125 shortly before that visit, and I will see her with cbc, cmet, ca125 in July. 2. 4 mm RLL pulmonary nodule and bilateral pleural based nodularity 5 mm on right and 10 mm on left. No symptoms at all  now; will reimage this with next scans needed. 3.anemia resolved  4. mammograms due 09-2013 5. Has not had colonoscopy (?). Will need referral to GI if not done by upcoming visits. 6.weight gain thru treatment, recently back exercising regularly  7.slight preexisting peripheral neuropathy fingertips right, no worse with taxol, and minimal residual taxol neuropathy in feet now. 8.flu vaccine done   Patient is in agreement with plan and has had all questions answered now.   Gordy Levan, MD   04/23/2013, 2:48 PM

## 2013-04-24 ENCOUNTER — Telehealth: Payer: Self-pay | Admitting: *Deleted

## 2013-04-24 LAB — CA 125: CA 125: 13.1 U/mL (ref 0.0–30.2)

## 2013-04-24 NOTE — Telephone Encounter (Signed)
Left message on cell phone regarding labs. Please call [phone] if she has any questions.

## 2013-04-24 NOTE — Telephone Encounter (Signed)
Message copied by Hebert Soho on Thu Apr 24, 2013 12:48 PM ------      Message from: Gordy Levan      Created: Thu Apr 24, 2013  9:09 AM       Labs seen and need follow up: please let her know chemistries all fine and ca125 marker in good range at [place], [place], [place] ------

## 2013-07-15 ENCOUNTER — Other Ambulatory Visit: Payer: BC Managed Care – PPO

## 2013-07-15 DIAGNOSIS — C561 Malignant neoplasm of right ovary: Secondary | ICD-10-CM

## 2013-07-15 LAB — CA 125: CA 125: 15.4 U/mL (ref 0.0–30.2)

## 2013-07-16 ENCOUNTER — Telehealth: Payer: Self-pay | Admitting: Gynecologic Oncology

## 2013-07-16 NOTE — Telephone Encounter (Signed)
Office Visit Note: Gyn-Onc  Shelley Sanchez 58 y.o. female  CC:  Ovarian cancer surveillance  Assessment/Plan:  58 y.o. with abdominal pelvic mass, ascites, and pleural effusions. CA 125 was elevated to about 2400 and CEA is normal at the time of presentation.  She underwent optimal debulking on 04/09/2012. 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  She completed six cycles dose dense  TaxolCarboplatin therapy 08/2012. NED x 10 months Genetic counselling Followup with GYN Onc in 3 months    HPI: 58 y.o. year old G3 P3 LNMP in early 68's. In October 2013 Shelley Sanchez noted fatigue, and fecal urge incontinence. Also noted increasing abdominal girth. No change in appetite, no nausea or vomiting. Reports SOB onset in November 2013. Denies uterine bleeding, no abdominal pain just discomfort. Was evaluated in urgent care care in December 2013 for malaise and referred to Dr. Melvyn Novas (Pulmonology) Thoracocentesis performed prior to Christmas and the cytology was negative for malignancy. Reported labored breathing 03/26/2012 and a thoracocentesis was performed. There was  return of pleural effusions was noted. CT Chest/Abd/Pelvis 04/01/2012 notable for ascites so patient was admitted for evaluation. Paracentesis performed 04/01/2012. Results were negative for malignancy. 25 at the time of presentation was 2418.  CT Chest Abd/Pelvis: 04/01/2012  IMPRESSION:  Large bilateral pleural effusions with extensive passive atelectasis in both lungs.  Abdomen/Pelvis: 3.7 x 3.1 cm low attenuation lesion in segment 7 of the liver. Smaller subcentimeter low attenuation lesion is also noted in segment 7 of the liver (image 53 of series 2) There is a large volume of ascites throughout the peritoneal cavity. In the pelvis there is a complex heterogeneous appearing soft tissue mass that appears to have multiple internal cystic components and some small amounts of calcium that  measures at least 12.5 x 12.7 x 11.8 cm.   Shelley Sanchez underwent exploratory laparotomy total abdominal hysterectomy bilateral salpingo-oophorectomy omentectomy radical debulking to optimal disease on 04/09/2012.  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 The only gross disease present was a 7 mm lesion in the posterior most aspect of the liver.  Path consistent with stage IIIc high grade serous cancer.  She received  six cycles of dose dense Taxol carboplatin therapy completed 09/10/2012  CT 10/08/2012 IMPRESSION:  1. Small subpleural pulmonary nodules noted at the left lung base. Recommend attention on future studies. The large pleural effusions have resolved.  2. Resection of large pelvic mass with a small scattered pelvic lymph nodes and a 2.1 cm soft tissue density in the right pelvis. Recommend attention on future studies.  3. Scattered retroperitoneal and mesenteric lymph nodes but no mass or omental peritoneal surface caking or nodularity.   PET/CT on 10/15/2012 Trace pelvic stranding/ascites. No gross peritoneal disease. Right pelvic abnormality noted on prior CT is non-FDG-avid and favored to reflect a mildly prominent pelvic vessel rather than a soft tissue mass. 4 mm right lower lobe pulmonary nodule with additional pleural- based nodularity at the lung bases measuring up to 10 mm. While non-FDG-avid, PET is considered insensitive for this evaluation.  Ms. [name] has seen Dr. Wray Kearns for evaluation of the pulmonary nodules. Serial followup has been recommended   Reports weight gain.  No nausea vomiting, no bloating.  Neuropathy is worse in the Sanchez.    Ref. Range 07/30/2012 11:49 08/20/2012 14:42 09/17/2012 11:30 11/14/2012 14:01 04/23/2013 13:47 07/15/2013 08:20  CA 125 Latest Range: 0.0-30.2 U/mL 30.0 23.2 26.2 18.3 13.1 15.4  Social Hx:  Resides with her significant other  Past Surgical Hx:  Past Surgical History  Procedure  Laterality Date  . Breast enhancement surgery  2000  . Laparotomy  04/09/2012    Procedure: EXPLORATORY LAPAROTOMY;  Surgeon: Shelley Morning, MD PHD;  Location: [facility];  Service: Gynecology;  Laterality: N/A;  EXPLORATORY LAPAROTOMY, TOTAL ABDOMINAL HYESTERCTOMY, BILATERAL SALPINGO OOPHERECTOMY  . Abdominal hysterectomy  04/09/2012    Procedure: HYSTERECTOMY ABDOMINAL;  Surgeon: Shelley Morning, MD PHD;  Location: [facility];  Service: Gynecology;  Laterality: N/A;    Past Medical Hx:  Past Medical History  Diagnosis Date  . Skin cancer   . Shortness of breath     bilateral pleural effusions - s/p thoracentesis  . Ascites     s/p paracentesis  . Pelvic mass     Family Hx:  Family History  Problem Relation Age of Onset  . Melanoma Mother   . Skin cancer Brother    PHYSICAL EXAMINATION Vitals BP 108/65  Pulse 76  Temp(Src) 97.9 F (36.6 C) (Oral)  Resp 16  Ht 5\' 8"  (1.727 m)  Wt 162 lb 9.6 oz (73.755 kg)  BMI 24.73 kg/m2 Wt Readings from Last 3 Encounters:  04/23/13 162 lb 3.2 oz (73.573 kg)  01/14/13 162 lb 9.6 oz (73.755 kg)  11/14/12 164 lb (74.39 kg)   Neck  Supple without any enlargements.  Cardiovascular  Pulse normal rate, regularity and rhythm. S1 and S2 normal,  Lungs  Clear to auscultation bilateraly, without wheezes/crackles/rhonchi.  Skin  No rash/lesions/breakdown  Psychiatry  Alert and oriented to person, place, and time  Abdomen  Normoactive bowel sounds, abdomen soft, non-tender and obese. Surgical  sites intact without evidence of hernia.  Genito Urinary  Vulva/vagina: Normal external female genitalia.  No lesions. No discharge or bleeding.  Bladder/urethra:  No lesions or masses  Vagina:  No masses no cul-de-sac nodularity, atrophic, no nodularity no cul de sac masses Rectal  Good tone, no masses no cul de sac nodularity.  Extremities  No bilateral cyanosis, clubbing or edema. No rash, lesions or petiche.

## 2013-07-17 ENCOUNTER — Encounter: Payer: Self-pay | Admitting: Gynecologic Oncology

## 2013-07-17 ENCOUNTER — Ambulatory Visit: Payer: BC Managed Care – PPO | Attending: Gynecologic Oncology | Admitting: Gynecologic Oncology

## 2013-07-17 VITALS — BP 119/61 | HR 66 | Temp 98.0°F | Resp 16 | Ht 68.0 in | Wt 160.3 lb

## 2013-07-17 DIAGNOSIS — C569 Malignant neoplasm of unspecified ovary: Secondary | ICD-10-CM | POA: Insufficient documentation

## 2013-07-17 NOTE — Addendum Note (Signed)
Addended by: Joylene John D on: 07/17/2013 12:31 PM   Modules accepted: Orders

## 2013-07-17 NOTE — Patient Instructions (Addendum)
No evidence of disease Genetic counselling Followup with Dr. Marko Plume and GYN Onc in 6 months    Thank you very much Shelley Sanchez for allowing me to provide care for you today.  I appreciate your confidence in choosing our Gynecologic Oncology team.  If you have any questions about your visit today please call our office and we will get back to you as soon as possible.  Francetta Found. Adhira Jamil MD., PhD Gynecologic Oncology

## 2013-07-17 NOTE — Progress Notes (Signed)
Office Visit Note: Gyn-Onc  Shelley Sanchez 58 y.o. female  CC:  Ovarian cancer surveillance  Assessment/Plan:  58 y.o. with abdominal pelvic mass, ascites, and pleural effusions. CA 125 was elevated to about 2400 and CEA is normal at the time of presentation.  She underwent optimal debulking on 04/09/2012. 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  She completed six cycles dose dense  TaxolCarboplatin therapy 08/2012. NED x 10 months Genetic counselling Followup with GYN Onc in 6 months   HPI: 58 y.o. year old G3 P3 LNMP in early 47's. In October 2013 Shelley Sanchez noted fatigue, and fecal urge incontinence. Also noted increasing abdominal girth. No change in appetite, no nausea or vomiting. Reports SOB onset in November 2013. Denies uterine bleeding, no abdominal pain just discomfort. Was evaluated in urgent care care in December 2013 for malaise and referred to Dr. Melvyn Novas (Pulmonology) Thoracocentesis performed prior to Christmas and the cytology was negative for malignancy. Reported labored breathing 03/26/2012 and a thoracocentesis was performed. There was  return of pleural effusions was noted. CT Chest/Abd/Pelvis 04/01/2012 notable for ascites so patient was admitted for evaluation. Paracentesis performed 04/01/2012. Results were negative for malignancy.  CA -125 at the time of presentation was 2418.  CT Chest Abd/Pelvis: 04/01/2012  IMPRESSION:  Large bilateral pleural effusions with extensive passive atelectasis in both lungs.  Abdomen/Pelvis: 3.7 x 3.1 cm low attenuation lesion in segment 7 of the liver. Smaller subcentimeter low attenuation lesion is also noted in segment 7 of the liver (image 53 of series 2) There is a large volume of ascites throughout the peritoneal cavity. In the pelvis there is a complex heterogeneous appearing soft tissue mass that appears to have multiple internal cystic components and some small amounts of calcium  that measures at least 12.5 x 12.7 x 11.8 cm.   Shelley Sanchez underwent exploratory laparotomy total abdominal hysterectomy bilateral salpingo-oophorectomy omentectomy radical debulking to optimal disease on 04/09/2012.  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 The only gross disease present was a 7 mm lesion in the posterior most aspect of the liver.  Path consistent with stage IIIc high grade serous cancer.  She received  six cycles of dose dense Taxol carboplatin therapy completed 09/10/2012  CT 10/08/2012 IMPRESSION:  1. Small subpleural pulmonary nodules noted at the left lung base. Recommend attention on future studies. The large pleural effusions have resolved.  2. Resection of large pelvic mass with a small scattered pelvic lymph nodes and a 2.1 cm soft tissue density in the right pelvis. Recommend attention on future studies.  3. Scattered retroperitoneal and mesenteric lymph nodes but no mass or omental peritoneal surface caking or nodularity.   PET/CT on 10/15/2012 Trace pelvic stranding/ascites. No gross peritoneal disease. Right pelvic abnormality noted on prior CT is non-FDG-avid and favored to reflect a mildly prominent pelvic vessel rather than a soft tissue mass. 4 mm right lower lobe pulmonary nodule with additional pleural- based nodularity at the lung bases measuring up to 10 mm. While non-FDG-avid, PET is considered insensitive for this evaluation.  Shelley Sanchez has seen Dr. Wray Kearns for evaluation of the pulmonary nodules. Serial followup has been recommended   Reports weight gain.  No nausea vomiting, no bloating.  Residual neuropathy, no abdominal bloating, no incontinence, good appetite.    Ref. Range 07/30/2012 11:49 08/20/2012 14:42 09/17/2012 11:30 11/14/2012 14:01 04/23/2013 13:47 07/15/2013 08:20  CA 125 Latest Range: 0.0-30.2 U/mL 30.0 23.2  26.2 18.3 13.1 15.4   Social Hx:  Resides with her significant other  Past  Surgical Hx:  Past Surgical History  Procedure Laterality Date  . Breast enhancement surgery  2000  . Laparotomy  04/09/2012    Procedure: EXPLORATORY LAPAROTOMY;  Surgeon: [name], MD PHD;  Location: [facility];  Service: Gynecology;  Laterality: N/A;  EXPLORATORY LAPAROTOMY, TOTAL ABDOMINAL HYESTERCTOMY, BILATERAL SALPINGO OOPHERECTOMY  . Abdominal hysterectomy  04/09/2012    Procedure: HYSTERECTOMY ABDOMINAL;  Surgeon: [name], MD PHD;  Location: [facility];  Service: Gynecology;  Laterality: N/A;    Past Medical Hx:  Past Medical History  Diagnosis Date  . Skin cancer   . Shortness of breath     bilateral pleural effusions - s/p thoracentesis  . Ascites     s/p paracentesis  . Pelvic mass     Family Hx:  Family History  Problem Relation Age of Onset  . Melanoma Mother   . Skin cancer Brother    PHYSICAL EXAMINATION Vitals BP 119/61  Pulse 66  Temp(Src) 98 F (36.7 C) (Oral)  Resp 16  Ht 5\' 8"  (1.727 m)  Wt 160 lb 4.8 oz (72.712 kg)  BMI 24.38 kg/m2 Wt Readings from Last 3 Encounters:  07/17/13 160 lb 4.8 oz (72.712 kg)  04/23/13 162 lb 3.2 oz (73.573 kg)  01/14/13 162 lb 9.6 oz (73.755 kg)   Neck  Supple without any enlargements.  Cardiovascular  Pulse normal rate, regularity and rhythm.  Lungs  Clear to auscultation bilaterally  Psychiatry  Alert and oriented appropriate mood and affect.  Abdomen  Normoactive bowel sounds, abdomen soft, non-tender and obese. Surgical  sites intact without evidence of hernia.  Genito Urinary  Vulva/vagina: Normal external female genitalia.  No lesions. No discharge or bleeding.  Bladder/urethra:  No lesions or masses  Vagina:  No masses no cul-de-sac nodularity, atrophic, Rectal  Good tone, no masses no cul de sac nodularity.  Extremities  No bilateral cyanosis, clubbing or edema. No rash, lesions or petiche.

## 2013-07-23 ENCOUNTER — Telehealth: Payer: Self-pay | Admitting: Genetic Counselor

## 2013-07-23 NOTE — Telephone Encounter (Signed)
LEFT MESSAGE FOR PATIENT TO RETURN CALL TO SCHEDULE GENETIC APPT.  °

## 2013-08-11 ENCOUNTER — Telehealth: Payer: Self-pay | Admitting: Genetic Counselor

## 2013-08-11 NOTE — Telephone Encounter (Signed)
LEFT MESSAGE AND GAVE GENETIC APPT FOR 06/24 @ 11 W/GENETIC COUNSELOR.

## 2013-08-20 ENCOUNTER — Telehealth: Payer: Self-pay | Admitting: Oncology

## 2013-08-20 NOTE — Telephone Encounter (Signed)
, °

## 2013-08-28 ENCOUNTER — Telehealth: Payer: Self-pay

## 2013-08-28 NOTE — Telephone Encounter (Signed)
Left message for Shelley Sanchez to see her PCP or go to an urgent care if she does not have a PCP to evaluate her sore throat. Stated that if PCP/ Urgent Care feels Dr. Marko Plume needs to see patient after evaluation, that they or she can call for an appointment. She was seen in April 23, 2013 for visit.  No recurrence of ovarian cancer currently on observation.

## 2013-09-17 ENCOUNTER — Other Ambulatory Visit: Payer: BC Managed Care – PPO

## 2013-09-20 ENCOUNTER — Other Ambulatory Visit: Payer: Self-pay | Admitting: Oncology

## 2013-09-20 DIAGNOSIS — C561 Malignant neoplasm of right ovary: Secondary | ICD-10-CM

## 2013-09-23 ENCOUNTER — Encounter: Payer: Self-pay | Admitting: Genetic Counselor

## 2013-09-23 ENCOUNTER — Other Ambulatory Visit: Payer: BC Managed Care – PPO

## 2013-09-23 ENCOUNTER — Ambulatory Visit (HOSPITAL_BASED_OUTPATIENT_CLINIC_OR_DEPARTMENT_OTHER): Payer: BC Managed Care – PPO | Admitting: Genetic Counselor

## 2013-09-23 ENCOUNTER — Telehealth: Payer: Self-pay | Admitting: Oncology

## 2013-09-23 DIAGNOSIS — IMO0002 Reserved for concepts with insufficient information to code with codable children: Secondary | ICD-10-CM

## 2013-09-23 DIAGNOSIS — C569 Malignant neoplasm of unspecified ovary: Secondary | ICD-10-CM | POA: Insufficient documentation

## 2013-09-23 DIAGNOSIS — Z8041 Family history of malignant neoplasm of ovary: Secondary | ICD-10-CM

## 2013-09-23 NOTE — Telephone Encounter (Signed)
per lab forgot to do LL lab orders-asked to sch pt prior to LL appt @3 . called pt and left message4 to be here @ 2:30 for appt 7/1. Pt understoo

## 2013-09-23 NOTE — Progress Notes (Signed)
Patient Name: Shelley Sanchez Patient Age: 58 y.o. Encounter Date: 09/23/2013  Referring Physician: [name], MD  Primary Care Provider: Joylene John, MD   Shelley Sanchez, a 58 y.o. female, is being seen at the [facility] due to a personal and family history of ovarian cancer.  She presents to clinic today to discuss the possibility of a hereditary predisposition to cancer and discuss whether genetic testing is warranted.  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 She is s/p surgery and chemotherapy.   She reports having a yearly mammogram and clinical breast exam. She has not yet initiated colon cancer screening.  Past Medical History  Diagnosis Date  . Skin cancer   . Shortness of breath     bilateral pleural effusions - s/p thoracentesis  . Ascites     s/p paracentesis  . Pelvic mass   . Ovarian cancer   . Family history of ovarian cancer     Past Surgical History  Procedure Laterality Date  . Breast enhancement surgery  2000  . Laparotomy  04/09/2012    Procedure: EXPLORATORY LAPAROTOMY;  Surgeon: [name], MD PHD;  Location: [facility];  Service: Gynecology;  Laterality: N/A;  EXPLORATORY LAPAROTOMY, TOTAL ABDOMINAL HYESTERCTOMY, BILATERAL SALPINGO OOPHERECTOMY  . Abdominal hysterectomy  04/09/2012    Procedure: HYSTERECTOMY ABDOMINAL;  Surgeon: [name], MD PHD;  Location: [facility];  Service: Gynecology;  Laterality: N/A;    History   Social History  . Marital Status: Widowed    Spouse Name: N/A    Number of Children: 3  . Years of Education: N/A   Social History Main Topics  . Smoking status: Never Smoker   . Smokeless tobacco: Never Used  . Alcohol Use: No  . Drug Use: No  . Sexual Activity: Yes   Other Topics Concern  . Not on file   Social History Narrative  . No narrative on file     FAMILY HISTORY:   During the visit, a 4-generation  pedigree was obtained. Significant diagnoses include the following:  Family History  Problem Relation Age of Onset  . Melanoma Mother     dx 57s on her back; TAH/BSO in ~30 d/t bleeding  . Skin cancer Brother   . Thyroid cancer Brother   . Ovarian cancer Maternal Grandmother     deceased 81s  . Cancer Maternal Aunt     "abdonminal ca" possible ovarian; deceased 24s    Additionally, Shelley Sanchez has 3 sons (ages 21, 74, and 55). Her brother has 2 sons. Her father died in his 33s of stroke. She has one paternal aunt and one paternal uncles in their 24s. Her mother is 22 with a history of melanoma and TAH/BSO at a very early age. She only had one maternal aunt who had "abdominal cancer", but Shelley Sanchez says this may have been ovarian.  Ms. Surrette ancestry is [place], [place] and [place]. There is no known Jewish ancestry and no consanguinity.  ASSESSMENT AND PLAN: Shelley Sanchez is a 58 y.o. female with a personal history of ovarian cancer at age 4 and family history of ovarian cancer in her paternal grandmother and possibly a maternal aunt. Much of her family history is difficult to confirm. This history is somewhat suggestive of a hereditary predisposition to cancer. We reviewed the characteristics, features and inheritance patterns of hereditary cancer syndromes. We also discussed genetic testing, including the appropriate family members  to test, the process of testing, insurance coverage and implications of results.   Shelley Sanchez wished to pursue genetic testing and a blood sample will be sent to [facility] for analysis of the 24 genes on the OvaNext gene panel. We discussed the implications of a positive, negative and/ or Variant of Uncertain Significance (VUS) result. Results should be available in approximately 5 weeks, at which point we will contact her and address implications for her as well as address genetic testing for at-risk family members, if needed.    We encouraged Ms.  Sanchez to remain in contact with Cancer Genetics annually so that we can update the family history and inform her of any changes in cancer genetics and testing that may be of benefit for this family. Ms.  Sanchez questions were answered to her satisfaction today.   Thank you for the referral and allowing [place] to share in the care of your patient.   The patient was seen for a total of 30 minutes, greater than 50% of which was spent face-to-face counseling. This patient was discussed with the overseeing provider who agrees with the above.

## 2013-09-24 ENCOUNTER — Telehealth: Payer: Self-pay | Admitting: Oncology

## 2013-09-24 ENCOUNTER — Ambulatory Visit (HOSPITAL_BASED_OUTPATIENT_CLINIC_OR_DEPARTMENT_OTHER): Payer: BC Managed Care – PPO | Admitting: Oncology

## 2013-09-24 ENCOUNTER — Encounter: Payer: Self-pay | Admitting: Oncology

## 2013-09-24 ENCOUNTER — Other Ambulatory Visit (HOSPITAL_BASED_OUTPATIENT_CLINIC_OR_DEPARTMENT_OTHER): Payer: BC Managed Care – PPO

## 2013-09-24 ENCOUNTER — Other Ambulatory Visit: Payer: BC Managed Care – PPO

## 2013-09-24 VITALS — BP 108/71 | HR 73 | Temp 98.6°F | Resp 18 | Ht 68.0 in | Wt 159.7 lb

## 2013-09-24 DIAGNOSIS — R918 Other nonspecific abnormal finding of lung field: Secondary | ICD-10-CM

## 2013-09-24 DIAGNOSIS — C569 Malignant neoplasm of unspecified ovary: Secondary | ICD-10-CM

## 2013-09-24 DIAGNOSIS — C561 Malignant neoplasm of right ovary: Secondary | ICD-10-CM

## 2013-09-24 DIAGNOSIS — Z1231 Encounter for screening mammogram for malignant neoplasm of breast: Secondary | ICD-10-CM

## 2013-09-24 DIAGNOSIS — G620 Drug-induced polyneuropathy: Secondary | ICD-10-CM

## 2013-09-24 LAB — COMPREHENSIVE METABOLIC PANEL (CC13)
ALT: 14 U/L (ref 0–55)
ANION GAP: 8 meq/L (ref 3–11)
AST: 17 U/L (ref 5–34)
Albumin: 4.2 g/dL (ref 3.5–5.0)
Alkaline Phosphatase: 64 U/L (ref 40–150)
BILIRUBIN TOTAL: 0.39 mg/dL (ref 0.20–1.20)
BUN: 22.1 mg/dL (ref 7.0–26.0)
CALCIUM: 9.3 mg/dL (ref 8.4–10.4)
CHLORIDE: 105 meq/L (ref 98–109)
CO2: 27 mEq/L (ref 22–29)
CREATININE: 1.1 mg/dL (ref 0.6–1.1)
Glucose: 97 mg/dl (ref 70–140)
Potassium: 4.4 mEq/L (ref 3.5–5.1)
Sodium: 140 mEq/L (ref 136–145)
Total Protein: 7.1 g/dL (ref 6.4–8.3)

## 2013-09-24 LAB — CBC WITH DIFFERENTIAL/PLATELET
BASO%: 0.6 % (ref 0.0–2.0)
BASOS ABS: 0 10*3/uL (ref 0.0–0.1)
EOS ABS: 0.1 10*3/uL (ref 0.0–0.5)
EOS%: 1.6 % (ref 0.0–7.0)
HCT: 38.9 % (ref 34.8–46.6)
HEMOGLOBIN: 12.9 g/dL (ref 11.6–15.9)
LYMPH%: 31.2 % (ref 14.0–49.7)
MCH: 31.6 pg (ref 25.1–34.0)
MCHC: 33.1 g/dL (ref 31.5–36.0)
MCV: 95.4 fL (ref 79.5–101.0)
MONO#: 0.4 10*3/uL (ref 0.1–0.9)
MONO%: 7.5 % (ref 0.0–14.0)
NEUT%: 59.1 % (ref 38.4–76.8)
NEUTROS ABS: 2.8 10*3/uL (ref 1.5–6.5)
PLATELETS: 163 10*3/uL (ref 145–400)
RBC: 4.08 10*6/uL (ref 3.70–5.45)
RDW: 13 % (ref 11.2–14.5)
WBC: 4.7 10*3/uL (ref 3.9–10.3)
lymph#: 1.5 10*3/uL (ref 0.9–3.3)

## 2013-09-24 NOTE — Telephone Encounter (Signed)
per pof to sch pt appt-[facility] sch not in for 03/2014-printed and will give to [facility] pt copy of sch

## 2013-09-24 NOTE — Progress Notes (Signed)
OFFICE PROGRESS NOTE   09/24/2013   Physicians:W.Brewster, M.Wert, V.Leschber.   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 She saw Dr Skeet Latch 07-17-13 and will see her again in Oct; she will have new patient appointment with Dr Rowan Blase also in Oct. Last imaging was CT AP and PET in 09-2012. She met with genetics counselor on 09-23-13, testing sent and pending (24 gene OvaNext panel).  She has been feeling very well, with no complaints that seem referable to the ovarian cancer or that treatment.  She does not have PAC  She has just returned from trip to [place], enjoyed the beach and visited her elderly mother.  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     Ovarian cancer on right   05/21/2012 Initial Diagnosis Ovarian cancer on right  Patient was in usual excellent health thru Sept 2013,then in Oct 2013 more fatigued, with some fecal urge incontinence and abdominal fullness such that she began to diet to lose weight. By Marcene Duos she could not climb one flight of stairs and could walk only from office to car without SOB and coughing. She was seen at urgent care in Dec with bilateral pleural effusions on CXR, referred to Dr Clois Comber, with 1.6 liter right thoracentesis done 03-11-12 (no path in this EMR). She had CT CAP and CT angio chest with large bilateral pleural effusions, no PE, ascites and pelvic mass. She had paracentesis for 1.4 liters on 04-02-12 and left thoracentesis for 2 liters on 04-05-12. CA 125 was 2418. She was seen in consultation by Dr Skeet Latch, with TAH/BSO, radical debulking and omentectomy on 04-09-2012, which was optimal debulking with only  residual at completion of procedure being 7 mm area on posterior surface of right lobe liver. She was transfused 2 units PRBCs on 04-10-12 for Hgb down to 7.2 (from 12 preop). Pathology [phone]) high grade serous carcinoma involving right pelvic mass, serosa of uterus and peritoneum, omentum negative, no nodes submitted. Adjuvant therapy with dose dense taxol/ carboplatin began 05-03-12, with CA125 down to 30 by early April. Cycle 6 was completed 09-10-12.   Review of systems as above, also: Good energy, good appetite, no SOB or other respiratory symptoms, no abdominal or pelvic pain, bowel movements normal, no bladder symptoms, no bleeding, no LE swelling. No recent infectious illness. No new or different pain. Some muscle cramps in toes, fingers, back intermittently. Remainder of 10 point Review of Systems negative.  Objective:  Vital signs in last 24 hours:  BP 108/71  Pulse 73  Temp(Src) 98.6 F (37 C) (Oral)  Resp 18  Ht _0  (1.727 m)  Wt 159 lb 11.2 oz (72.439 kg)  BMI 24.29 kg/m2 weight is stable  Alert, oriented and appropriate. Ambulatory without difficulty.  No alopecia  HEENT:PERRL, sclerae not icteric. Oral mucosa moist without lesions, posterior pharynx clear.  Neck supple. No JVD.  Lymphatics:no cervical,suraclavicular, axillary or inguinal adenopathy Resp: clear to auscultation bilaterally and normal percussion bilaterally Cardio: regular rate and rhythm. No gallop. GI: soft, nontender, not distended, no mass or organomegaly. Normally active bowel sounds.  Musculoskeletal/ Extremities: without pitting edema, cords, tenderness Neuro: no peripheral neuropathy. Otherwise nonfocal Skin without rash, ecchymosis, petechiae   Lab Results:  Results for orders placed in visit on 09/24/13  COMPREHENSIVE METABOLIC PANEL (PB35)      Result Value Ref Range   Sodium 140  136 - 145 mEq/L   Potassium 4.4  3.5 - 5.1 mEq/L   Chloride 105  98 - 109 mEq/L   CO2 27  22 - 29 mEq/L    Glucose 97  70 - 140 mg/dl   BUN 22.1  7.0 - 26.0 mg/dL   Creatinine 1.1  0.6 - 1.1 mg/dL   Total Bilirubin 0.39  0.20 - 1.20 mg/dL   Alkaline Phosphatase 64  40 - 150 U/L   AST 17  5 - 34 U/L   ALT 14  0 - 55 U/L   Total Protein 7.1  6.4 - 8.3 g/dL   Albumin 4.2  3.5 - 5.0 g/dL   Calcium 9.3  8.4 - 10.4 mg/dL   Anion Gap 8  3 - 11 mEq/L  CBC WITH DIFFERENTIAL      Result Value Ref Range   WBC 4.7  3.9 - 10.3 10e3/uL   NEUT# 2.8  1.5 - 6.5 10e3/uL   HGB 12.9  11.6 - 15.9 g/dL   HCT 38.9  34.8 - 46.6 %   Platelets 163  145 - 400 10e3/uL   MCV 95.4  79.5 - 101.0 fL   MCH 31.6  25.1 - 34.0 pg   MCHC 33.1  31.5 - 36.0 g/dL   RBC 4.08  3.70 - 5.45 10e6/uL   RDW 13.0  11.2 - 14.5 %   lymph# 1.5  0.9 - 3.3 10e3/uL   MONO# 0.4  0.1 - 0.9 10e3/uL   Eosinophils Absolute 0.1  0.0 - 0.5 10e3/uL   Basophils Absolute 0.0  0.0 - 0.1 10e3/uL   NEUT% 59.1  38.4 - 76.8 %   LYMPH% 31.2  14.0 - 49.7 %   MONO% 7.5  0.0 - 14.0 %   EOS% 1.6  0.0 - 7.0 %   BASO% 0.6  0.0 - 2.0 %    CA125 available after visit lower than previously, at 12.2  Studies/Results:  No results found.  [facility] which we will schedule.  Medications: I have reviewed the patient's current medications.  DISCUSSION: she will add gatorade or pedialyte to her usual water for cramps. 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 Clinically doing well. She will see Dr Skeet Latch in 12-2013 with CA125 shortly before that visit, and I will see her with cbc, cmet, ca125 in July.  2. 4 mm RLL pulmonary nodule and bilateral pleural based nodularity 5 mm on right and 10 mm on left. No symptoms at all now; will reimage this with next scans needed.  3.anemia resolved  4. mammograms due 09-2013  5. Has not had colonoscopy. Will need  referral to GI.  6.weight gain thru treatment, recently back exercising regularly  7.slight preexisting peripheral neuropathy fingertips right, no worse with taxol, and minimal residual taxol neuropathy in feet now.  Patient understood discussion and is in agreement with plan above   Kaylla Cobos P, MD   09/24/2013, 4:46 PM

## 2013-09-25 ENCOUNTER — Telehealth: Payer: Self-pay | Admitting: *Deleted

## 2013-09-25 LAB — CA 125: CA 125: 12.2 U/mL (ref 0.0–30.2)

## 2013-09-25 NOTE — Telephone Encounter (Signed)
Message copied by Patton Salles on Thu Sep 25, 2013  2:58 PM ------      Message from: Evlyn Clines P      Created: Thu Sep 25, 2013  1:45 PM       Labs seen and need follow up: please let her know chemistries good and ca125 marker also good at 12.2 ------

## 2013-09-25 NOTE — Telephone Encounter (Signed)
Left message for patient to call us back at her convenience.

## 2013-09-25 NOTE — Telephone Encounter (Signed)
Pt notified of results

## 2013-09-29 ENCOUNTER — Telehealth: Payer: Self-pay | Admitting: Oncology

## 2013-09-29 NOTE — Telephone Encounter (Signed)
Per 09/26/13 POF lft msg for pt for labs 12/29/13, couldn't schedule apt w/Dr. Marko Plume in Jan 2016, pls see note from [facility] 09/24/13. MM scheduled on 10/28/13 .Marland KitchenMarland KitchenMarland KitchenMarland KitchenKJ

## 2013-10-22 ENCOUNTER — Encounter: Payer: Self-pay | Admitting: Genetic Counselor

## 2013-10-22 NOTE — Progress Notes (Signed)
Referring Physician: [name], MD   Ms. Munroe was called today to discuss genetic test results. Please see the Genetics note from her visit on 09/23/13 for a detailed discussion of her personal and family history.  GENETIC TESTING: At the time of Ms. Kilbride's visit, we recommended she pursue genetic testing of multiple genes on the OvaNext gene panel. This test, which included sequencing and deletion/duplication analysis of 24 genes, was performed at [facility]. Testing was normal and did not reveal a mutation in these genes. The genes tested were ATM, BARD1, BRCA1, BRCA2, BRIP1, CDH1, CHEK2, EPCAM, MLH1, MRE11A, MSH2, MSH6, MUTYH, NBN, NF1, PALB2, PMS2, PTEN, RAD50, RAD51C, RAD51D, SMARCA4, STK11, and TP53.  We discussed with Ms. Vandenboom that since the current test is not perfect, it is possible there may be a gene mutation that current testing cannot detect, but that chance is small. We also discussed that it is possible that a different genetic factor, which was not part of this testing or has not yet been discovered, is responsible for the cancer diagnoses in the family. This is also of low likelihood given her family history.  CANCER SCREENING: This result suggests that Ms. Baratta's cancer was most likely not due to an inherited predisposition. Most cancers happen by chance and this negative test, along with details of her family history, suggests that her cancer falls into this category. We, therefore, recommended she continue to follow the cancer screening guidelines provided by her physician.   FAMILY MEMBERS: Ms. Laguardia has no daughters and no sisters. We discussed that if any relative is diagnosed with a cancer at a young age or with an unusual tumor type, she can call to discuss whether additional genetic testing is warranted.  Lastly, we discussed with Ms. Zirkle that cancer genetics is a rapidly advancing field and it is possible that new genetic tests will be appropriate  for her in the future. We encouraged her to remain in contact with [place] on an annual basis so we can update her personal and family histories, and let her know of advances in cancer genetics that may benefit the family. Our contact number was provided. Ms. Lowman questions were answered to her satisfaction today, and she knows she is welcome to call anytime with additional questions.    Steele Berg, MS, [place] Certified Genetic Counseor phone: [phone] [email or web address]

## 2013-10-28 ENCOUNTER — Ambulatory Visit
Admission: RE | Admit: 2013-10-28 | Discharge: 2013-10-28 | Disposition: A | Discharge status: Home / Self Care | Payer: BC Managed Care – PPO | Source: Ambulatory Visit | Attending: Oncology | Admitting: Oncology

## 2013-10-28 ENCOUNTER — Other Ambulatory Visit: Payer: Self-pay | Admitting: Oncology

## 2013-10-28 DIAGNOSIS — Z1231 Encounter for screening mammogram for malignant neoplasm of breast: Secondary | ICD-10-CM

## 2013-12-26 ENCOUNTER — Telehealth: Payer: Self-pay | Admitting: Oncology

## 2013-12-26 NOTE — Telephone Encounter (Signed)
per pt VM CX appt-CX appt-adv to call back to r/s

## 2013-12-29 ENCOUNTER — Other Ambulatory Visit: Payer: BC Managed Care – PPO

## 2014-01-04 ENCOUNTER — Telehealth: Payer: Self-pay | Admitting: Gynecologic Oncology

## 2014-01-04 NOTE — Telephone Encounter (Signed)
Office Visit Note: Gyn-Onc  Shelley Sanchez 58 y.o. female  CC:  Ovarian cancer surveillance  Assessment/Plan:  58 y.o. with abdominal pelvic mass, ascites, and pleural effusions. CA 125 was elevated to about 2400 and CEA is normal at the time of presentation.  She underwent optimal debulking on 04/09/2012. 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  She completed six cycles dose dense  TaxolCarboplatin therapy 08/2012. NED x 10 months Genetic counselling Followup with GYN Onc in 6 months   HPI: 58 y.o. G3 P3 LNMP in early 55's. In October 2013 Shelley Sanchez noted fatigue, and fecal urge incontinence. Also noted increasing abdominal girth. No change in appetite, no nausea or vomiting. Reports SOB onset in November 2013. Denies uterine bleeding, no abdominal pain just discomfort. Was evaluated in urgent care care in December 2013 for malaise and referred to Dr. Melvyn Novas (Pulmonology) Thoracocentesis performed prior to Christmas and the cytology was negative for malignancy. Reported labored breathing 03/26/2012 and a thoracocentesis was performed. There was  return of pleural effusions was noted. CT Chest/Abd/Pelvis 04/01/2012 notable for ascites so patient was admitted for evaluation. Paracentesis performed 04/01/2012. Results were negative for malignancy.  CA -125 at the time of presentation was 2418.  CT Chest Abd/Pelvis: 04/01/2012  IMPRESSION:  Large bilateral pleural effusions with extensive passive atelectasis in both lungs.  Abdomen/Pelvis: 3.7 x 3.1 cm low attenuation lesion in segment 7 of the liver. Smaller subcentimeter low attenuation lesion is also noted in segment 7 of the liver (image 53 of series 2) There is a large volume of ascites throughout the peritoneal cavity. In the pelvis there is a complex heterogeneous appearing soft tissue mass that appears to have multiple internal cystic components and some small amounts of calcium that  measures at least 12.5 x 12.7 x 11.8 cm.   Shelley Sanchez underwent exploratory laparotomy total abdominal hysterectomy bilateral salpingo-oophorectomy omentectomy radical debulking to optimal disease on 04/09/2012.  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 The only gross disease present was a 7 mm lesion in the posterior most aspect of the liver.  Path consistent with stage IIIc high grade serous cancer.  She received  six cycles of dose dense Taxol carboplatin therapy completed 09/10/2012  CT 10/08/2012 IMPRESSION:  1. Small subpleural pulmonary nodules noted at the left lung base. Recommend attention on future studies. The large pleural effusions have resolved.  2. Resection of large pelvic mass with a small scattered pelvic lymph nodes and a 2.1 cm soft tissue density in the right pelvis. Recommend attention on future studies.  3. Scattered retroperitoneal and mesenteric lymph nodes but no mass or omental peritoneal surface caking or nodularity.   PET/CT on 10/15/2012 Trace pelvic stranding/ascites. No gross peritoneal disease. Right pelvic abnormality noted on prior CT is non-FDG-avid and favored to reflect a mildly prominent pelvic vessel rather than a soft tissue mass. 4 mm right lower lobe pulmonary nodule with additional pleural- based nodularity at the lung bases measuring up to 10 mm. While non-FDG-avid, PET is considered insensitive for this evaluation.  Ms. Shelley Sanchez has seen Dr. Wray Sanchez for evaluation of the pulmonary nodules. Serial followup has been recommended   Reports weight gain.  No nausea vomiting, no bloating.  Residual neuropathy, no abdominal bloating, no incontinence, good appetite.    Ref. Range 07/30/2012 11:49 08/20/2012 14:42 09/17/2012 11:30 11/14/2012 14:01 04/23/2013 13:47 07/15/2013 08:20  CA 125 Latest Range: 0.0-30.2 U/mL 30.0 23.2 26.2 18.3  13.1 15.4   Social Hx:  Resides with her significant other  Past Surgical  Hx:  Past Surgical History  Procedure Laterality Date  . Breast enhancement surgery  2000  . Laparotomy  04/09/2012    Procedure: EXPLORATORY LAPAROTOMY;  Surgeon: [name], MD PHD;  Location: [facility];  Service: Gynecology;  Laterality: N/A;  EXPLORATORY LAPAROTOMY, TOTAL ABDOMINAL HYESTERCTOMY, BILATERAL SALPINGO OOPHERECTOMY  . Abdominal hysterectomy  04/09/2012    Procedure: HYSTERECTOMY ABDOMINAL;  Surgeon: [name], MD PHD;  Location: [facility];  Service: Gynecology;  Laterality: N/A;    Past Medical Hx:  Past Medical History  Diagnosis Date  . Skin cancer   . Shortness of breath     bilateral pleural effusions - s/p thoracentesis  . Ascites     s/p paracentesis  . Pelvic mass   . Ovarian cancer   . Family history of ovarian cancer     Family Hx:  Family History  Problem Relation Age of Onset  . Melanoma Mother     dx 80s on her back; TAH/BSO in ~30 d/t bleeding  . Skin cancer Brother   . Thyroid cancer Brother   . Ovarian cancer Maternal Grandmother     deceased 74s  . Cancer Maternal Aunt     "abdonminal ca" possible ovarian; deceased 28s   PHYSICAL EXAMINATION Vitals BP 119/61  Pulse 66  Temp(Src) 98 F (36.7 C) (Oral)  Resp 16  Ht 5\' 8"  (1.727 m)  Wt 160 lb 4.8 oz (72.712 kg)  BMI 24.38 kg/m2 Wt Readings from Last 3 Encounters:  09/24/13 159 lb 11.2 oz (72.439 kg)  07/17/13 160 lb 4.8 oz (72.712 kg)  04/23/13 162 lb 3.2 oz (73.573 kg)   Neck  Supple without any enlargements.  Cardiovascular  Pulse normal rate, regularity and rhythm.  Lungs  Clear to auscultation bilaterally  Psychiatry  Alert and oriented appropriate mood and affect.  Abdomen  Normoactive bowel sounds, abdomen soft, non-tender and obese. Surgical  sites intact without evidence of hernia.  Genito Urinary  Vulva/vagina: Normal external female genitalia.  No lesions. No discharge or bleeding.  Bladder/urethra:  No lesions or masses  Vagina:  No masses no cul-de-sac nodularity,  atrophic, Rectal  Good tone, no masses no cul de sac nodularity.  Extremities  No bilateral cyanosis, clubbing or edema. No rash, lesions or petiche.

## 2014-01-05 ENCOUNTER — Other Ambulatory Visit (HOSPITAL_BASED_OUTPATIENT_CLINIC_OR_DEPARTMENT_OTHER): Payer: BC Managed Care – PPO

## 2014-01-05 ENCOUNTER — Ambulatory Visit: Payer: BC Managed Care – PPO | Attending: Gynecologic Oncology | Admitting: Gynecologic Oncology

## 2014-01-05 ENCOUNTER — Encounter: Payer: Self-pay | Admitting: Gynecologic Oncology

## 2014-01-05 VITALS — BP 100/46 | HR 72 | Temp 97.6°F | Resp 20 | Ht 68.0 in | Wt 159.0 lb

## 2014-01-05 DIAGNOSIS — C569 Malignant neoplasm of unspecified ovary: Secondary | ICD-10-CM

## 2014-01-05 DIAGNOSIS — C561 Malignant neoplasm of right ovary: Secondary | ICD-10-CM

## 2014-01-05 LAB — COMPREHENSIVE METABOLIC PANEL (CC13)
ALT: 13 U/L (ref 0–55)
ANION GAP: 10 meq/L (ref 3–11)
AST: 18 U/L (ref 5–34)
Albumin: 4.4 g/dL (ref 3.5–5.0)
Alkaline Phosphatase: 67 U/L (ref 40–150)
BUN: 16.5 mg/dL (ref 7.0–26.0)
CALCIUM: 10 mg/dL (ref 8.4–10.4)
CHLORIDE: 105 meq/L (ref 98–109)
CO2: 27 meq/L (ref 22–29)
CREATININE: 1 mg/dL (ref 0.6–1.1)
GLUCOSE: 98 mg/dL (ref 70–140)
Potassium: 4.3 mEq/L (ref 3.5–5.1)
Sodium: 142 mEq/L (ref 136–145)
Total Bilirubin: 0.64 mg/dL (ref 0.20–1.20)
Total Protein: 7.8 g/dL (ref 6.4–8.3)

## 2014-01-05 LAB — CBC WITH DIFFERENTIAL/PLATELET
BASO%: 0.6 % (ref 0.0–2.0)
BASOS ABS: 0 10*3/uL (ref 0.0–0.1)
EOS ABS: 0.1 10*3/uL (ref 0.0–0.5)
EOS%: 1.9 % (ref 0.0–7.0)
HEMATOCRIT: 39.3 % (ref 34.8–46.6)
HEMOGLOBIN: 13.3 g/dL (ref 11.6–15.9)
LYMPH#: 1.5 10*3/uL (ref 0.9–3.3)
LYMPH%: 32.9 % (ref 14.0–49.7)
MCH: 31.7 pg (ref 25.1–34.0)
MCHC: 33.9 g/dL (ref 31.5–36.0)
MCV: 93.4 fL (ref 79.5–101.0)
MONO#: 0.3 10*3/uL (ref 0.1–0.9)
MONO%: 7 % (ref 0.0–14.0)
NEUT%: 57.6 % (ref 38.4–76.8)
NEUTROS ABS: 2.6 10*3/uL (ref 1.5–6.5)
PLATELETS: 185 10*3/uL (ref 145–400)
RBC: 4.21 10*6/uL (ref 3.70–5.45)
RDW: 13 % (ref 11.2–14.5)
WBC: 4.5 10*3/uL (ref 3.9–10.3)

## 2014-01-05 NOTE — Progress Notes (Signed)
Office Visit Note: Gyn-Onc  Shelley Sanchez 58 y.o. female  CC:  Ovarian cancer surveillance  Assessment/Plan:  58 y.o. with abdominal pelvic mass, ascites, and pleural effusions. CA 125 was elevated to about 2400 and CEA is normal at the time of presentation.  She underwent optimal debulking on 04/09/2012. 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  She completed six cycles dose dense  TaxolCarboplatin therapy 08/2012. NED x 14 months Followup with GYN Onc in 6 months   HPI: 58 y.o. G3 P3 LNMP in early 69's. In October 2013 Shelley Sanchez noted fatigue, and fecal urge incontinence. Also noted increasing abdominal girth. No change in appetite, no nausea or vomiting. Reports SOB onset in November 2013. Denies uterine bleeding, no abdominal pain just discomfort. Was evaluated in urgent care care in December 2013 for malaise and referred to Dr. Melvyn Novas (Pulmonology) Thoracocentesis performed prior to Christmas and the cytology was negative for malignancy. Reported labored breathing 03/26/2012 and a thoracocentesis was performed. There was  return of pleural effusions was noted. CT Chest/Abd/Pelvis 04/01/2012 notable for ascites so patient was admitted for evaluation. Paracentesis performed 04/01/2012. Results were negative for malignancy.  CA -125 at the time of presentation was 2418.  CT Chest Abd/Pelvis: 04/01/2012  IMPRESSION:  Large bilateral pleural effusions with extensive passive atelectasis in both lungs.  Abdomen/Pelvis: 3.7 x 3.1 cm low attenuation lesion in segment 7 of the liver. Smaller subcentimeter low attenuation lesion is also noted in segment 7 of the liver (image 53 of series 2) There is a large volume of ascites throughout the peritoneal cavity. In the pelvis there is a complex heterogeneous appearing soft tissue mass that appears to have multiple internal cystic components and some small amounts of calcium that measures at least 12.5 x  12.7 x 11.8 cm.   Shelley Sanchez underwent exploratory laparotomy total abdominal hysterectomy bilateral salpingo-oophorectomy omentectomy radical debulking to optimal disease on 04/09/2012.  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 The only gross disease present was a 7 mm lesion in the posterior most aspect of the liver.  Path consistent with stage IIIc high grade serous cancer.  She received  six cycles of dose dense Taxol carboplatin therapy completed 09/10/2012  CT 10/08/2012 IMPRESSION:  1. Small subpleural pulmonary nodules noted at the left lung base. Recommend attention on future studies. The large pleural effusions have resolved.  2. Resection of large pelvic mass with a small scattered pelvic lymph nodes and a 2.1 cm soft tissue density in the right pelvis. Recommend attention on future studies.  3. Scattered retroperitoneal and mesenteric lymph nodes but no mass or omental peritoneal surface caking or nodularity.   PET/CT on 10/15/2012 Trace pelvic stranding/ascites. No gross peritoneal disease. Right pelvic abnormality noted on prior CT is non-FDG-avid and favored to reflect a mildly prominent pelvic vessel rather than a soft tissue mass. 4 mm right lower lobe pulmonary nodule with additional pleural- based nodularity at the lung bases measuring up to 10 mm. While non-FDG-avid, PET is considered insensitive for this evaluation.  Shelley Sanchez has seen Dr. Wray Kearns for evaluation of the pulmonary nodules. Serial followup has been recommended   Reports weight gain.  No nausea vomiting, no bloating.  Residual neuropathy, no abdominal bloating, no incontinence, good appetite.    Ref. Range 07/30/2012 11:49 08/20/2012 14:42 09/17/2012 11:30 11/14/2012 14:01 04/23/2013 13:47 07/15/2013 08:20  CA 125 Latest Range: 0.0-30.2 U/mL 30.0 23.2 26.2 18.3 13.1 15.4  Social Hx:  Resides with her significant other  Past Surgical Hx:  Past Surgical History   Procedure Laterality Date  . Breast enhancement surgery  2000  . Laparotomy  04/09/2012    Procedure: EXPLORATORY LAPAROTOMY;  Surgeon: [name], MD PHD;  Location: [facility];  Service: Gynecology;  Laterality: N/A;  EXPLORATORY LAPAROTOMY, TOTAL ABDOMINAL HYESTERCTOMY, BILATERAL SALPINGO OOPHERECTOMY  . Abdominal hysterectomy  04/09/2012    Procedure: HYSTERECTOMY ABDOMINAL;  Surgeon: [name], MD PHD;  Location: [facility];  Service: Gynecology;  Laterality: N/A;    Past Medical Hx:  Past Medical History  Diagnosis Date  . Skin cancer   . Shortness of breath     bilateral pleural effusions - s/p thoracentesis  . Ascites     s/p paracentesis  . Pelvic mass   . Ovarian cancer   . Family history of ovarian cancer   Genetic testing was normal and did not reveal a mutation in these genes. The genes tested were ATM, BARD1, BRCA1, BRCA2, BRIP1, CDH1, CHEK2, EPCAM, MLH1, MRE11A, MSH2, MSH6, MUTYH, NBN, NF1, PALB2, PMS2, PTEN, RAD50, RAD51C, RAD51D, SMARCA4, STK11, and TP53.   Family Hx:  Family History  Problem Relation Age of Onset  . Melanoma Mother     dx 52s on her back; TAH/BSO in ~30 d/t bleeding  . Skin cancer Brother   . Thyroid cancer Brother   . Ovarian cancer Maternal Grandmother     deceased 47s  . Cancer Maternal Aunt     "abdonminal ca" possible ovarian; deceased 34s   PHYSICAL EXAMINATION Vitals BP 100/46  Pulse 72  Temp(Src) 97.6 F (36.4 C) (Oral)  Resp 20  Ht '5\' 8"'  (1.727 m)  Wt 159 lb (72.122 kg)  BMI 24.18 kg/m2 Wt Readings from Last 3 Encounters:  01/05/14 159 lb (72.122 kg)  09/24/13 159 lb 11.2 oz (72.439 kg)  07/17/13 160 lb 4.8 oz (72.712 kg)   Neck  Supple without any enlargements.  Cardiovascular  Pulse normal rate, regularity and rhythm.  Lungs  Clear to auscultation bilaterally  Psychiatry  Alert and oriented appropriate mood and affect.  Abdomen  Normoactive bowel sounds, abdomen soft, non-tender and obese. Surgical  sites intact  without evidence of hernia.  Genito Urinary  Vulva/vagina: Normal external female genitalia.  No lesions. No discharge or bleeding.  Bladder/urethra:  No lesions or masses  Vagina:  No masses no cul-de-sac nodularity, atrophic, Rectal  Good tone, no masses no cul de sac nodularity.  Extremities  No bilateral cyanosis, clubbing or edema. No rash, lesions or petiche.

## 2014-01-05 NOTE — Patient Instructions (Signed)
  Followup with GYN Onc in 6 months  Body mass index is 24.18 kg/(m^2). Wt Readings from Last 3 Encounters:  01/05/14 159 lb (72.122 kg)  09/24/13 159 lb 11.2 oz (72.439 kg)  07/17/13 160 lb 4.8 oz (72.712 kg)      Thank you very much Ms. Mahlet Jergens for allowing me to provide care for you today.  I appreciate your confidence in choosing our Gynecologic Oncology team.  If you have any questions about your visit today please call our office and we will get back to you as soon as possible.  Please consider using the website Medlineplus.gov as an Geneticist, molecular.   Francetta Found. Toryn Dewalt MD., PhD Gynecologic Oncology

## 2014-01-06 ENCOUNTER — Ambulatory Visit (INDEPENDENT_AMBULATORY_CARE_PROVIDER_SITE_OTHER): Payer: BC Managed Care – PPO | Admitting: Internal Medicine

## 2014-01-06 ENCOUNTER — Other Ambulatory Visit (INDEPENDENT_AMBULATORY_CARE_PROVIDER_SITE_OTHER): Payer: BC Managed Care – PPO

## 2014-01-06 ENCOUNTER — Encounter: Payer: Self-pay | Admitting: Internal Medicine

## 2014-01-06 ENCOUNTER — Other Ambulatory Visit: Payer: Self-pay | Admitting: Oncology

## 2014-01-06 VITALS — BP 110/70 | HR 69 | Temp 98.1°F | Ht 68.0 in | Wt 160.5 lb

## 2014-01-06 DIAGNOSIS — C44622 Squamous cell carcinoma of skin of right upper limb, including shoulder: Secondary | ICD-10-CM

## 2014-01-06 DIAGNOSIS — Z0000 Encounter for general adult medical examination without abnormal findings: Secondary | ICD-10-CM

## 2014-01-06 DIAGNOSIS — R918 Other nonspecific abnormal finding of lung field: Secondary | ICD-10-CM

## 2014-01-06 DIAGNOSIS — Z85828 Personal history of other malignant neoplasm of skin: Secondary | ICD-10-CM | POA: Insufficient documentation

## 2014-01-06 DIAGNOSIS — Z1211 Encounter for screening for malignant neoplasm of colon: Secondary | ICD-10-CM

## 2014-01-06 LAB — LIPID PANEL
CHOLESTEROL: 162 mg/dL (ref 0–200)
HDL: 58 mg/dL (ref >39.00)
LDL CALC: 96 mg/dL (ref 0–99)
NonHDL: 104
TRIGLYCERIDES: 42 mg/dL (ref 0.0–149.0)
Total CHOL/HDL Ratio: 3
VLDL: 8.4 mg/dL (ref 0.0–40.0)

## 2014-01-06 LAB — CA 125: CA 125: 16 U/mL (ref <35)

## 2014-01-06 LAB — TSH: TSH: 2.5 u[IU]/mL (ref 0.35–4.50)

## 2014-01-06 LAB — CA 125(PREVIOUS METHOD): CA 125: 14 U/mL (ref 0.0–30.2)

## 2014-01-06 NOTE — Progress Notes (Signed)
Pre visit review using our clinic review tool, if applicable. No additional management support is needed unless otherwise documented below in the visit note. 

## 2014-01-06 NOTE — Progress Notes (Signed)
Subjective:    Patient ID: Shelley Sanchez, female    DOB: 1955/11/13, 58 y.o.   MRN: [id number]  HPI  New to me, here to establish with new PCP  patient is here today for annual physical. Patient feels well and has no complaints.  Also reviewed chronic medical issues and interval medical events  Past Medical History  Diagnosis Date  . Squamous cell carcinoma of right hand 2006    s/p excision  . Ascites 03/2012    on CT a/p; s/p paracentesis  . Ovarian cancer 03/2012 dx    s/p debulk = stage III C poor diff, ov serous ca  . Family history of ovarian cancer   . 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 Melanoma Mother 37    on her back; TAH/BSO in ~30 d/t bleeding  . Skin cancer Brother 56    squamous cell  . Thyroid cancer Brother 42    s/p thyroidectomy  . Ovarian cancer Maternal Aunt 13    "abd ca" possible ovarian; deceased 36s  . Rheum arthritis Mother   . Macular degeneration Mother    History  Substance Use Topics  . Smoking status: Never Smoker   . Smokeless tobacco: Never Used  . Alcohol Use: No    Review of Systems  Constitutional: Negative for fatigue and unexpected weight change.  Respiratory: Negative for cough, shortness of breath and wheezing.   Cardiovascular: Negative for chest pain, palpitations and leg swelling.  Gastrointestinal: Negative for nausea, abdominal pain and diarrhea.  Neurological: Negative for dizziness, weakness, light-headedness and headaches.  Psychiatric/Behavioral: Negative for dysphoric mood. The patient is not nervous/anxious.   All other systems reviewed and are negative.      Objective:   Physical Exam  BP 110/70  Pulse 69  Temp(Src) 98.1 F (36.7 C) (Oral)  Ht 5\' 8"  (1.727 m)  Wt 160 lb 8 oz (72.802 kg)  BMI 24.41 kg/m2  SpO2 97% Wt Readings from Last 3 Encounters:  01/06/14 160 lb 8 oz (72.802 kg)  01/05/14 159 lb (72.122 kg)  09/24/13  159 lb 11.2 oz (72.439 kg)   Constitutional: She appears well-developed and well-nourished. No distress.  HENT: Head: Normocephalic and atraumatic. Ears: B TMs ok, no erythema or effusion; Nose: Nose normal. Mouth/Throat: Oropharynx is clear and moist. No oropharyngeal exudate.  Eyes: Conjunctivae and EOM are normal. Pupils are equal, round, and reactive to light. No scleral icterus.  Neck: Normal range of motion. Neck supple. No JVD present. No thyromegaly present.  Cardiovascular: Normal rate, regular rhythm and normal heart sounds.  No murmur heard. No BLE edema. Pulmonary/Chest: Effort normal and breath sounds normal. No respiratory distress. She has no wheezes.  Abdominal: Soft. Bowel sounds are normal. She exhibits no distension. There is no tenderness. no masses GU/breast: defer to gyn Musculoskeletal: Normal range of motion, no joint effusions. No gross deformities Neurological: She is alert and oriented to person, place, and time. No cranial nerve deficit. Coordination, balance, strength, speech and gait are normal.  Skin: Skin is warm and dry. No rash noted. No erythema.  Psychiatric: She has a normal mood and affect. Her behavior is normal. Judgment and thought content normal.    Lab Results  Component Value Date   WBC 4.5 01/05/2014   HGB 13.3 01/05/2014   HCT 39.3 01/05/2014   PLT 185 01/05/2014   GLUCOSE 98 01/05/2014   ALT  13 01/05/2014   AST 18 01/05/2014   NA 142 01/05/2014   K 4.3 01/05/2014   CL 105 09/17/2012   CREATININE 1.0 01/05/2014   BUN 16.5 01/05/2014   CO2 27 01/05/2014   TSH 5.96* 03/07/2012   INR 1.13 04/01/2012    Mm Screening Breast W/implant Tomo Bilateral  10/29/2013   CLINICAL DATA:  Screening.  EXAM: DIGITAL SCREENING BILATERAL MAMMOGRAM WITH IMPLANTS, 3D TOMO WITH CAD  The patient has retropectoral implants. Standard and implant displaced views were performed. DIGITAL BREAST TOMOSYNTHESIS  Digital breast tomosynthesis images are acquired in two  projections. These images are reviewed in combination with the digital mammogram, confirming the findings below.  COMPARISON:  Previous exam(s)  ACR Breast Density Category b: There are scattered areas of fibroglandular density.  FINDINGS: There are no findings suspicious for malignancy. Images were processed with CAD.  IMPRESSION: No mammographic evidence of malignancy. A result letter of this screening mammogram will be mailed directly to the patient.  RECOMMENDATION: Screening mammogram in one year. (Code:SM-B-01Y)  BI-RADS CATEGORY  1:  Negative.   Electronically Signed   By: Luberta Robertson M.D.   On: 10/29/2013 08:13       Assessment & Plan:   CPX/z00.00 - Patient has been counseled on age-appropriate routine health concerns for screening and prevention. These are reviewed and up-to-date. Immunizations are up-to-date or declined. Labs ordered and reviewed.

## 2014-01-06 NOTE — Patient Instructions (Addendum)
It was good to see you today.  We have reviewed your prior records including labs and tests today  Health Maintenance reviewed - all recommended immunizations and age-appropriate screenings are up-to-date or declined.  We will make referrals to dermatologist for skin check and to gastroenterology for colonoscopy screening. My office will call you with these appointments once made  Test(s) ordered today. Your results will be released to [place] (or called to you) after review, usually within 72hours after test completion. If any changes need to be made, you will be notified at that same time.  Medications reviewed and updated, no changes recommended at this time.  Please schedule followup in 12 months for annual exam and labs, call sooner if problems.  Health Maintenance Adopting a healthy lifestyle and getting preventive care can go a long way to promote health and wellness. Talk with your health care provider about what schedule of regular examinations is right for you. This is a good chance for you to check in with your provider about disease prevention and staying healthy. In between checkups, there are plenty of things you can do on your own. Experts have done a lot of research about which lifestyle changes and preventive measures are most likely to keep you healthy. Ask your health care provider for more information. WEIGHT AND DIET  Eat a healthy diet  Be sure to include plenty of vegetables, fruits, low-fat dairy products, and lean protein.  Do not eat a lot of foods high in solid fats, added sugars, or salt.  Get regular exercise. This is one of the most important things you can do for your health.  Most adults should exercise for at least 150 minutes each week. The exercise should increase your heart rate and make you sweat (moderate-intensity exercise).  Most adults should also do strengthening exercises at least twice a week. This is in addition to the moderate-intensity  exercise.  Maintain a healthy weight  Body mass index (BMI) is a measurement that can be used to identify possible weight problems. It estimates body fat based on height and weight. Your health care provider can help determine your BMI and help you achieve or maintain a healthy weight.  For females 66 years of age and older:   A BMI below 18.5 is considered underweight.  A BMI of 18.5 to 24.9 is normal.  A BMI of 25 to 29.9 is considered overweight.  A BMI of 30 and above is considered obese.  Watch levels of cholesterol and blood lipids  You should start having your blood tested for lipids and cholesterol at 59 years of age, then have this test every 5 years.  You may need to have your cholesterol levels checked more often if:  Your lipid or cholesterol levels are high.  You are older than 59 years of age.  You are at high risk for heart disease.  CANCER SCREENING   Lung Cancer  Lung cancer screening is recommended for adults 52-35 years old who are at high risk for lung cancer because of a history of smoking.  A yearly low-dose CT scan of the lungs is recommended for people who:  Currently smoke.  Have quit within the past 15 years.  Have at least a 30-pack-year history of smoking. A pack year is smoking an average of one pack of cigarettes a day for 1 year.  Yearly screening should continue until it has been 15 years since you quit.  Yearly screening should stop if you develop  a health problem that would prevent you from having lung cancer treatment.  Breast Cancer  Practice breast self-awareness. This means understanding how your breasts normally appear and feel.  It also means doing regular breast self-exams. Let your health care provider know about any changes, no matter how small.  If you are in your 20s or 30s, you should have a clinical breast exam (CBE) by a health care provider every 1-3 years as part of a regular health exam.  If you are 57 or  older, have a CBE every year. Also consider having a breast X-ray (mammogram) every year.  If you have a family history of breast cancer, talk to your health care provider about genetic screening.  If you are at high risk for breast cancer, talk to your health care provider about having an MRI and a mammogram every year.  Breast cancer gene (BRCA) assessment is recommended for women who have family members with BRCA-related cancers. BRCA-related cancers include:  Breast.  Ovarian.  Tubal.  Peritoneal cancers.  Results of the assessment will determine the need for genetic counseling and BRCA1 and BRCA2 testing. Cervical Cancer Routine pelvic examinations to screen for cervical cancer are no longer recommended for nonpregnant women who are considered low risk for cancer of the pelvic organs (ovaries, uterus, and vagina) and who do not have symptoms. A pelvic examination may be necessary if you have symptoms including those associated with pelvic infections. Ask your health care provider if a screening pelvic exam is right for you.   The Pap test is the screening test for cervical cancer for women who are considered at risk.  If you had a hysterectomy for a problem that was not cancer or a condition that could lead to cancer, then you no longer need Pap tests.  If you are older than 65 years, and you have had normal Pap tests for the past 10 years, you no longer need to have Pap tests.  If you have had past treatment for cervical cancer or a condition that could lead to cancer, you need Pap tests and screening for cancer for at least 20 years after your treatment.  If you no longer get a Pap test, assess your risk factors if they change (such as having a new sexual partner). This can affect whether you should start being screened again.  Some women have medical problems that increase their chance of getting cervical cancer. If this is the case for you, your health care provider may  recommend more frequent screening and Pap tests.  The human papillomavirus (HPV) test is another test that may be used for cervical cancer screening. The HPV test looks for the virus that can cause cell changes in the cervix. The cells collected during the Pap test can be tested for HPV.  The HPV test can be used to screen women 68 years of age and older. Getting tested for HPV can extend the interval between normal Pap tests from three to five years.  An HPV test also should be used to screen women of any age who have unclear Pap test results.  After 59 years of age, women should have HPV testing as often as Pap tests.  Colorectal Cancer  This type of cancer can be detected and often prevented.  Routine colorectal cancer screening usually begins at 59 years of age and continues through 59 years of age.  Your health care provider may recommend screening at an earlier age if you have risk  factors for colon cancer.  Your health care provider may also recommend using home test kits to check for hidden blood in the stool.  A small camera at the end of a tube can be used to examine your colon directly (sigmoidoscopy or colonoscopy). This is done to check for the earliest forms of colorectal cancer.  Routine screening usually begins at age 61.  Direct examination of the colon should be repeated every 5-10 years through 59 years of age. However, you may need to be screened more often if early forms of precancerous polyps or small growths are found. Skin Cancer  Check your skin from head to toe regularly.  Tell your health care provider about any new moles or changes in moles, especially if there is a change in a mole's shape or color.  Also tell your health care provider if you have a mole that is larger than the size of a pencil eraser.  Always use sunscreen. Apply sunscreen liberally and repeatedly throughout the day.  Protect yourself by wearing long sleeves, pants, a wide-brimmed hat,  and sunglasses whenever you are outside. HEART DISEASE, DIABETES, AND HIGH BLOOD PRESSURE   Have your blood pressure checked at least every 1-2 years. High blood pressure causes heart disease and increases the risk of stroke.  If you are between 22 years and 65 years old, ask your health care provider if you should take aspirin to prevent strokes.  Have regular diabetes screenings. This involves taking a blood sample to check your fasting blood sugar level.  If you are at a normal weight and have a low risk for diabetes, have this test once every three years after 59 years of age.  If you are overweight and have a high risk for diabetes, consider being tested at a younger age or more often. PREVENTING INFECTION  Hepatitis B  If you have a higher risk for hepatitis B, you should be screened for this virus. You are considered at high risk for hepatitis B if:  You were born in a country where hepatitis B is common. Ask your health care provider which countries are considered high risk.  Your parents were born in a high-risk country, and you have not been immunized against hepatitis B (hepatitis B vaccine).  You have HIV or AIDS.  You use needles to inject street drugs.  You live with someone who has hepatitis B.  You have had sex with someone who has hepatitis B.  You get hemodialysis treatment.  You take certain medicines for conditions, including cancer, organ transplantation, and autoimmune conditions. Hepatitis C  Blood testing is recommended for:  Everyone born from [birth year] through 1966.  Anyone with known risk factors for hepatitis C. Sexually transmitted infections (STIs)  You should be screened for sexually transmitted infections (STIs) including gonorrhea and chlamydia if:  You are sexually active and are younger than 59 years of age.  You are older than 59 years of age and your health care provider tells you that you are at risk for this type of infection.  Your  sexual activity has changed since you were last screened and you are at an increased risk for chlamydia or gonorrhea. Ask your health care provider if you are at risk.  If you do not have HIV, but are at risk, it may be recommended that you take a prescription medicine daily to prevent HIV infection. This is called pre-exposure prophylaxis (PrEP). You are considered at risk if:  You are sexually active  and do not regularly use condoms or know the HIV status of your partner(s).  You take drugs by injection.  You are sexually active with a partner who has HIV. Talk with your health care provider about whether you are at high risk of being infected with HIV. If you choose to begin PrEP, you should first be tested for HIV. You should then be tested every 3 months for as long as you are taking PrEP.  PREGNANCY   If you are premenopausal and you may become pregnant, ask your health care provider about preconception counseling.  If you may become pregnant, take 400 to 800 micrograms (mcg) of folic acid every day.  If you want to prevent pregnancy, talk to your health care provider about birth control (contraception). OSTEOPOROSIS AND MENOPAUSE   Osteoporosis is a disease in which the bones lose minerals and strength with aging. This can result in serious bone fractures. Your risk for osteoporosis can be identified using a bone density scan.  If you are 57 years of age or older, or if you are at risk for osteoporosis and fractures, ask your health care provider if you should be screened.  Ask your health care provider whether you should take a calcium or vitamin D supplement to lower your risk for osteoporosis.  Menopause may have certain physical symptoms and risks.  Hormone replacement therapy may reduce some of these symptoms and risks. Talk to your health care provider about whether hormone replacement therapy is right for you.  HOME CARE INSTRUCTIONS   Schedule regular health, dental, and  eye exams.  Stay current with your immunizations.   Do not use any tobacco products including cigarettes, chewing tobacco, or electronic cigarettes.  If you are pregnant, do not drink alcohol.  If you are breastfeeding, limit how much and how often you drink alcohol.  Limit alcohol intake to no more than 1 drink per day for nonpregnant women. One drink equals 12 ounces of beer, 5 ounces of wine, or 1 ounces of hard liquor.  Do not use street drugs.  Do not share needles.  Ask your health care provider for help if you need support or information about quitting drugs.  Tell your health care provider if you often feel depressed.  Tell your health care provider if you have ever been abused or do not feel safe at home. Document Released: 09/26/2010 Document Revised: 07/28/2013 Document Reviewed: 02/12/2013 [facility] Patient Information 2015 [place], [place]. This information is not intended to replace advice given to you by your health care provider. Make sure you discuss any questions you have with your health care provider.

## 2014-01-06 NOTE — Assessment & Plan Note (Signed)
Has been followed in pulmonary by Dr. Melvyn Novas for same PET 10/15/12  4 mm right lower lobe pulmonary nodule with additional pleural-  based nodularity at the lung bases measuring up to 10 mm. While  non-FDG-avid, PET is considered insensitive for this evaluation.   Per 10/2012 OV: These lesions are tiny and not amenable to an easy tissue and given the big picture of IIIc Ovarian ca should be considered in the context of what we do if they prove to be mets as they are very unlikely to represent any form of primary or curable lung ca even if they prove to be maligant some day.  Would simply follow them serially and refer to IR when / if clinically relevant changes occur in the nodule size to warrant a bx and only then if it would substantially change treatment plans

## 2014-01-08 ENCOUNTER — Telehealth: Payer: Self-pay | Admitting: *Deleted

## 2014-01-08 NOTE — Telephone Encounter (Signed)
Message copied by Christa See on Thu Jan 08, 2014  1:14 PM ------      Message from: Gordy Levan      Created: Tue Jan 06, 2014 12:04 PM       Labs seen and need follow up: please let her know marker is stable in good range. Please be sure she knows I need to see her in Jan, as appt books not open yet. ------

## 2014-01-08 NOTE — Telephone Encounter (Signed)
Called and spoke with pt as noted below by Dr. Marko Plume and let her know CA125 is stable. Told pt to call us back in a few weeks to get appt with Dr. Marko Plume in January.  Pt appreciative of call and agreeable to call back for appt.

## 2014-01-10 ENCOUNTER — Telehealth: Payer: Self-pay | Admitting: Oncology

## 2014-02-12 ENCOUNTER — Encounter: Payer: Self-pay | Admitting: Internal Medicine

## 2014-02-17 ENCOUNTER — Other Ambulatory Visit: Payer: Self-pay | Admitting: Dermatology

## 2014-03-29 ENCOUNTER — Other Ambulatory Visit: Payer: Self-pay | Admitting: Oncology

## 2014-03-29 DIAGNOSIS — C561 Malignant neoplasm of right ovary: Secondary | ICD-10-CM

## 2014-04-06 ENCOUNTER — Encounter: Payer: Self-pay | Admitting: Oncology

## 2014-04-06 ENCOUNTER — Ambulatory Visit (HOSPITAL_BASED_OUTPATIENT_CLINIC_OR_DEPARTMENT_OTHER): Payer: BLUE CROSS/BLUE SHIELD | Admitting: Oncology

## 2014-04-06 ENCOUNTER — Other Ambulatory Visit (HOSPITAL_BASED_OUTPATIENT_CLINIC_OR_DEPARTMENT_OTHER): Payer: BLUE CROSS/BLUE SHIELD

## 2014-04-06 VITALS — BP 117/55 | HR 60 | Temp 98.6°F | Resp 18 | Ht 68.0 in | Wt 167.5 lb

## 2014-04-06 DIAGNOSIS — Z1231 Encounter for screening mammogram for malignant neoplasm of breast: Secondary | ICD-10-CM

## 2014-04-06 DIAGNOSIS — C561 Malignant neoplasm of right ovary: Secondary | ICD-10-CM

## 2014-04-06 DIAGNOSIS — C4490 Unspecified malignant neoplasm of skin, unspecified: Secondary | ICD-10-CM

## 2014-04-06 DIAGNOSIS — G620 Drug-induced polyneuropathy: Secondary | ICD-10-CM

## 2014-04-06 LAB — CBC WITH DIFFERENTIAL/PLATELET
BASO%: 0.8 % (ref 0.0–2.0)
Basophils Absolute: 0 10*3/uL (ref 0.0–0.1)
EOS%: 4.8 % (ref 0.0–7.0)
Eosinophils Absolute: 0.2 10*3/uL (ref 0.0–0.5)
HCT: 41.2 % (ref 34.8–46.6)
HGB: 13.7 g/dL (ref 11.6–15.9)
LYMPH#: 1.2 10*3/uL (ref 0.9–3.3)
LYMPH%: 31.7 % (ref 14.0–49.7)
MCH: 31.3 pg (ref 25.1–34.0)
MCHC: 33.1 g/dL (ref 31.5–36.0)
MCV: 94.4 fL (ref 79.5–101.0)
MONO#: 0.3 10*3/uL (ref 0.1–0.9)
MONO%: 7.5 % (ref 0.0–14.0)
NEUT%: 55.2 % (ref 38.4–76.8)
NEUTROS ABS: 2 10*3/uL (ref 1.5–6.5)
Platelets: 153 10*3/uL (ref 145–400)
RBC: 4.37 10*6/uL (ref 3.70–5.45)
RDW: 12.9 % (ref 11.2–14.5)
WBC: 3.7 10*3/uL — AB (ref 3.9–10.3)

## 2014-04-06 LAB — COMPREHENSIVE METABOLIC PANEL (CC13)
ALT: 12 U/L (ref 0–55)
AST: 19 U/L (ref 5–34)
Albumin: 4.1 g/dL (ref 3.5–5.0)
Alkaline Phosphatase: 75 U/L (ref 40–150)
Anion Gap: 8 mEq/L (ref 3–11)
BILIRUBIN TOTAL: 0.46 mg/dL (ref 0.20–1.20)
BUN: 15.4 mg/dL (ref 7.0–26.0)
CO2: 28 mEq/L (ref 22–29)
Calcium: 9.3 mg/dL (ref 8.4–10.4)
Chloride: 106 mEq/L (ref 98–109)
Creatinine: 0.9 mg/dL (ref 0.6–1.1)
EGFR: 70 mL/min/{1.73_m2} — ABNORMAL LOW (ref >90)
Glucose: 97 mg/dl (ref 70–140)
POTASSIUM: 4.3 meq/L (ref 3.5–5.1)
Sodium: 142 mEq/L (ref 136–145)
Total Protein: 7.2 g/dL (ref 6.4–8.3)

## 2014-04-06 NOTE — Progress Notes (Signed)
OFFICE PROGRESS NOTE   04/06/2014   Physicians:W.Brewster, M.Wert, V.Leschber.  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 She saw Dr Skeet Latch 01-05-14 and will see her again in 6 months from that visit. Last imaging was CT AP and PET 09-2012.   Patient has been doing well since she was here last, tho still not exercising as regularly as prior to the cancer treatment. She has established with Dr Asa Lente as PCP, first visit 12-2013 and will see her again in a year. 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 She will have colonoscopy scheduled by Dr Katheren Puller office.  She has no abdominal or pelvic discomfort, no recent infectious illness, no cough or SOB, no bleeding, no LE swelling, no changes bowels or bladder.  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     Ovarian cancer on right   05/21/2012 Initial Diagnosis Ovarian cancer on right  Patient was in usual excellent health thru Sept 2013,then in Oct 2013 more fatigued, with some fecal urge incontinence and abdominal fullness such that she began to diet to lose weight. By Marcene Duos she could not climb one flight of stairs and could walk only from office to car without SOB and coughing. She was seen at urgent care in Dec with bilateral pleural effusions on CXR, referred to Dr Clois Comber, with 1.6 liter right thoracentesis done 03-11-12 (no path in this EMR). She had CT CAP and CT angio chest with large bilateral pleural  effusions, no PE, ascites and pelvic mass. She had paracentesis for 1.4 liters on 04-02-12 and left thoracentesis for 2 liters on 04-05-12. CA 125 was 2418. She was seen in consultation by Dr Skeet Latch, with TAH/BSO, radical debulking and omentectomy on 04-09-2012, which was optimal debulking with only residual at completion of procedure being 7 mm area on posterior surface of right lobe liver. She was transfused 2 units PRBCs on 04-10-12 for Hgb down to 7.2 (from 12 preop). Pathology [phone]) high grade serous carcinoma involving right pelvic mass, serosa of uterus and peritoneum, omentum negative, no nodes submitted. Adjuvant therapy with dose dense taxol/ carboplatin began 05-03-12, with CA125 down to 30 by early April. Cycle 6 was completed 09-10-12.  Review of systems as above, also: No new or different neurologic symptoms. Energy good. No noted changes in breasts. No other changes or concerning skin lesions.  Remainder of 10 point Review of Systems negative.  Objective:  Vital signs in last 24 hours: Weight up 8 lbs from my visit 09-2013, to 167.5 lbs. 5'8", 117/55, 60 regular, 18 not labored RA, 98.6 Looks comfortable, very pleasant as always  Alert, oriented and appropriate. Ambulatory without difficulty.  No alopecia  HEENT:PERRL, sclerae not icteric. Oral mucosa moist without lesions, posterior pharynx clear.  Neck supple. No JVD.  Lymphatics:no cervical,supraclavicular, axillary or inguinal adenopathy Resp: clear to auscultation bilaterally and normal percussion bilaterally Cardio: regular rate and rhythm. No gallop. GI: soft, nontender, not distended, no mass or organomegaly. Normally active bowel sounds. Surgical incision not remarkable.  Musculoskeletal/ Extremities: without pitting edema, cords, tenderness Neuro: no change mild peripheral neuropathy soles of feet since taxol. Otherwise nonfocal Skin area of MOHS and skin graft covered with dressing, no swelling or erythema in that area,  otherwise without rash, ecchymosis, petechiae   Lab Results:  Results for orders placed or performed in visit on 04/06/14  CBC with Differential  Result Value Ref Range   WBC 3.7 (L) 3.9 - 10.3 10e3/uL   NEUT# 2.0 1.5 - 6.5 10e3/uL   HGB 13.7 11.6 - 15.9 g/dL   HCT 41.2 34.8 - 46.6 %   Platelets 153 145 - 400 10e3/uL   MCV 94.4 79.5 - 101.0 fL   MCH 31.3 25.1 - 34.0 pg   MCHC 33.1 31.5 - 36.0 g/dL   RBC 4.37 3.70 - 5.45 10e6/uL   RDW 12.9 11.2 - 14.5 %   lymph# 1.2 0.9 - 3.3 10e3/uL   MONO# 0.3 0.1 - 0.9 10e3/uL   Eosinophils Absolute 0.2 0.0 - 0.5 10e3/uL   Basophils Absolute 0.0 0.0 - 0.1 10e3/uL   NEUT% 55.2 38.4 - 76.8 %   LYMPH% 31.7 14.0 - 49.7 %   MONO% 7.5 0.0 - 14.0 %   EOS% 4.8 0.0 - 7.0 %   BASO% 0.8 0.0 - 2.0 %   CMET available after visit normal with exception of EGFR 70 CA 125 available after visit 22 by new method/ 14.4 by old method, corresponding to 27 by old method and 16 by new method 10-12 15: appears stable. NOTE marker elevated at 2418 at presentation with ovarian cancer, so will continue to follow this.   Studies/Results:  Last mammograms at [facility] 10-29-13: bilateral implants, scattered fibroglandular density, otherwise no findings of concern. Needs yearly mamograms, scheduled now.  Last CXR 04-2012 did not show pulmonary nodules seen on previous scans  Medications: I have reviewed the patient's current medications.  DISCUSSION: clinically doing well, continue observation. Encouraged regular exercise, which she has always enjoyed, and watch diet. She will see Dr Skeet Latch ~ April with labs, and I will see her in 6 months unless Dr Skeet Latch recommends less frequent visits.   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 Clinically doing well. Genetics testing negative. She will see Dr Skeet Latch in 06-2014 with CA125, and I  will see her with cbc, cmet, ca125 in ~ July, or can move that apt later if Dr Skeet Latch agrees.  2. 4 mm RLL pulmonary nodule and bilateral pleural based nodularity 5 mm on right and 10 mm on left with initial scans, seen by Dr Melvyn Novas, no pulmonary symptoms now.  3.anemia resolved  4. mammograms due 10-2014, scheduled now  5. Has not had colonoscopy. Referral to GI by PCP  6.weight gain since working from home: resume exercising regularly  7.slight preexisting peripheral neuropathy fingertips right, no worse with taxol, and minimal residual taxol neuropathy in feet now. 8. nonmelanoma skin cancer: post MOHS and skin graft to right upper lip area last week. Has follow up with dermatologist later today.    All questions answered and patient is in agreement with plans and recommendations.    LIVESAY,LENNIS P, MD   04/06/2014, 9:51 AM

## 2014-04-07 LAB — CA 125(PREVIOUS METHOD): CA 125: 14.4 U/mL (ref 0.0–30.2)

## 2014-04-07 LAB — CA 125: CA 125: 22 U/mL (ref <35)

## 2014-04-08 ENCOUNTER — Telehealth: Payer: Self-pay

## 2014-04-08 NOTE — Telephone Encounter (Signed)
Left a message in Ms. Beidleman's voicemail stating results of CA-125 from 04-06-14 as note below by Dr. Marko Plume.  Ms. Nations can call Dr. [name] office if any questions or concerns.

## 2014-04-08 NOTE — Telephone Encounter (Signed)
-----   Message from Gordy Levan, MD sent at 04/07/2014  3:36 PM EST ----- Labs seen and need follow up: please let her know CA125 good in normal range. Value is a little different because lab method changed. Follow up as planned

## 2014-07-28 ENCOUNTER — Telehealth: Payer: Self-pay | Admitting: Oncology

## 2014-07-28 NOTE — Telephone Encounter (Signed)
Returned Advertising account executive. Patient confirmed appointment for July and will check with Dr.Brewster for appointment.

## 2014-09-24 ENCOUNTER — Telehealth: Payer: Self-pay | Admitting: Oncology

## 2014-09-24 ENCOUNTER — Encounter: Payer: Self-pay | Admitting: Gynecologic Oncology

## 2014-09-24 ENCOUNTER — Ambulatory Visit: Payer: BLUE CROSS/BLUE SHIELD | Attending: Gynecologic Oncology | Admitting: Gynecologic Oncology

## 2014-09-24 ENCOUNTER — Other Ambulatory Visit: Payer: BLUE CROSS/BLUE SHIELD

## 2014-09-24 VITALS — BP 114/70 | HR 64 | Temp 97.7°F | Resp 20 | Ht 68.0 in | Wt 164.0 lb

## 2014-09-24 DIAGNOSIS — C569 Malignant neoplasm of unspecified ovary: Secondary | ICD-10-CM

## 2014-09-24 DIAGNOSIS — Z8543 Personal history of malignant neoplasm of ovary: Secondary | ICD-10-CM

## 2014-09-24 NOTE — Progress Notes (Signed)
Office Visit Note: Gyn-Onc  Shelley Sanchez 59 y.o. female  CC:  Ovarian cancer surveillance  Assessment/Plan:  59 y.o. with abdominal pelvic mass, ascites, and pleural effusions. CA 125 was elevated to about 2400 and CEA is normal at the time of presentation.  She underwent optimal debulking on 04/09/2012. 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 No clinical evidence of disease She completed six cycles dose dense  TaxolCarboplatin therapy 08/2012. NED x 24 months Followup with GYN Onc in 6 months F/U with Dr. Marko Plume in 3 months  CA125 increased to 22 from nadir of 12.  Will repeat.    HPI: 59 y.o. G3 P3 LNMP in early 6's. In October 2013 Shelley Sanchez noted fatigue, and fecal urge incontinence. Also noted increasing abdominal girth. No change in appetite, no nausea or vomiting. Reports SOB onset in November 2013. Denies uterine bleeding, no abdominal pain just discomfort. Was evaluated in urgent care care in December 2013 for malaise and referred to Dr. Melvyn Novas (Pulmonology) Thoracocentesis performed prior to Christmas and the cytology was negative for malignancy. Reported labored breathing 03/26/2012 and a thoracocentesis was performed. There was  return of pleural effusions was noted. CT Chest/Abd/Pelvis 04/01/2012 notable for ascites so patient was admitted for evaluation. Paracentesis performed 04/01/2012. Results were negative for malignancy.  CA -125 at the time of presentation was 2418.  CT Chest Abd/Pelvis: 04/01/2012  IMPRESSION:  Large bilateral pleural effusions with extensive passive atelectasis in both lungs.  Abdomen/Pelvis: 3.7 x 3.1 cm low attenuation lesion in segment 7 of the liver. Smaller subcentimeter low attenuation lesion is also noted in segment 7 of the liver (image 53 of series 2) There is a large volume of ascites throughout the peritoneal cavity. In the pelvis there is a complex heterogeneous appearing soft tissue mass  that appears to have multiple internal cystic components and some small amounts of calcium that measures at least 12.5 x 12.7 x 11.8 cm.   Shelley Sanchez underwent exploratory laparotomy total abdominal hysterectomy bilateral salpingo-oophorectomy omentectomy radical debulking to optimal disease on 04/09/2012.  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 The only gross disease present was a 7 mm lesion in the posterior most aspect of the liver.  Path consistent with stage IIIc high grade serous cancer.  She received  six cycles of dose dense Taxol carboplatin therapy completed 09/10/2012  CT 10/08/2012 IMPRESSION:  1. Small subpleural pulmonary nodules noted at the left lung base. Recommend attention on future studies. The large pleural effusions have resolved.  2. Resection of large pelvic mass with a small scattered pelvic lymph nodes and a 2.1 cm soft tissue density in the right pelvis. Recommend attention on future studies.  3. Scattered retroperitoneal and mesenteric lymph nodes but no mass or omental peritoneal surface caking or nodularity.   PET/CT on 10/15/2012 Trace pelvic stranding/ascites. No gross peritoneal disease. Right pelvic abnormality noted on prior CT is non-FDG-avid and favored to reflect a mildly prominent pelvic vessel rather than a soft tissue mass. 4 mm right lower lobe pulmonary nodule with additional pleural- based nodularity at the lung bases measuring up to 10 mm. While non-FDG-avid, PET is considered insensitive for this evaluation.  Shelley Sanchez has seen Dr. Wray Kearns for evaluation of the pulmonary nodules. Serial followup has been recommended   Reports weight gain.  No nausea vomiting, no bloating.  Residual neuropathy, no abdominal bloating, no incontinence, good appetite.    Ref. Range 07/30/2012  11:49 08/20/2012 14:42 09/17/2012 11:30 11/14/2012 14:01 04/23/2013 13:47 07/15/2013 08:20  CA 125 Latest Range: 0.0-30.2 U/mL 30.0  23.2 26.2 18.3 13.1 15.4   Lab Results  Component Value Date   CA125 22 04/06/2014   Doing very wll  Social Hx:  Resides with her significant other  Past Surgical Hx:  Past Surgical History  Procedure Laterality Date  . Breast enhancement surgery  2000  . Laparotomy  04/09/2012    Procedure: EXPLORATORY LAPAROTOMY;  Surgeon: [name], MD PHD;  Location: [facility];  Service: Gynecology;  Laterality: N/A;  EXPLORATORY LAPAROTOMY, TOTAL ABDOMINAL HYESTERCTOMY, BILATERAL SALPINGO OOPHERECTOMY  . Abdominal hysterectomy  04/09/2012    Procedure: HYSTERECTOMY ABDOMINAL;  Surgeon: [name], MD PHD;  Location: [facility];  Service: Gynecology;  Laterality: N/A;    Past Medical Hx:  Past Medical History  Diagnosis Date  . Squamous cell carcinoma of right hand 2006    s/p excision  . Ascites 03/2012    on CT a/p; s/p paracentesis  . Ovarian cancer 03/2012 dx    s/p debulk = stage III C poor diff, ov serous ca  . Family history of ovarian cancer   . 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 The genes tested were ATM, BARD1, BRCA1, BRCA2, BRIP1, CDH1, CHEK2, EPCAM, MLH1, MRE11A, MSH2, MSH6, MUTYH, NBN, NF1, PALB2, PMS2, PTEN, RAD50, RAD51C, RAD51D, SMARCA4, STK11, and TP53.   Family Hx:  Family History  Problem Relation Age of Onset  . Melanoma Mother 25    on her back; TAH/BSO in ~30 d/t bleeding  . Skin cancer Brother 56    squamous cell  . Thyroid cancer Brother 66    s/p thyroidectomy  . Ovarian cancer Maternal Aunt 65    "abd ca" possible ovarian; deceased 41s  . Rheum arthritis Mother   . Macular degeneration Mother    PHYSICAL EXAMINATION Vitals BP 114/70 mmHg  Pulse 64  Temp(Src) 97.7 F (36.5 C) (Oral)  Resp 20  Ht _0  (1.727 m)  Wt 164 lb (74.39 kg)  BMI 24.94 kg/m2  SpO2 100% Wt Readings from Last 3 Encounters:  09/24/14 164 lb (74.39 kg)  04/06/14 167 lb 8 oz  (75.978 kg)  01/06/14 160 lb 8 oz (72.802 kg)   Neck  Supple without any enlargements.  Cardiovascular  Pulse normal rate, regularity and rhythm.  Lungs  Clear to auscultation bilaterally  Psychiatry  Alert and oriented appropriate mood and affect.  Abdomen  Normoactive bowel sounds, abdomen soft, non-tender and obese. Surgical  site intact without evidence of hernia.  Genito Urinary  Vulva/vagina: Normal external female genitalia.  No lesions. No discharge or bleeding.  Bladder/urethra:  No lesions or masses  Vagina:  No masses no cul-de-sac nodularity,   Rectal  Good tone, no masses no cul de sac nodularity.  Extremities  No bilateral cyanosis, clubbing or edema. No rash, lesions or petiche.

## 2014-09-24 NOTE — Patient Instructions (Signed)
We will contact you with the results of your CA 125 from today.  Plan to follow up with Dr. Skeet Latch in Jan 2017.  Please call closer to the date to schedule.  Plan to see Dr. Marko Plume in October 2016

## 2014-09-24 NOTE — Telephone Encounter (Signed)
Per Lenna Sciara c and dr Skeet Latch please move dr Marko Plume appointment from july to October as she was seen by dr Skeet Latch today

## 2014-09-25 ENCOUNTER — Telehealth: Payer: Self-pay | Admitting: Gynecologic Oncology

## 2014-09-25 LAB — CA 125: CA 125: 25 U/mL (ref <35)

## 2014-09-25 NOTE — Telephone Encounter (Signed)
Left message with CA 125 results and Dr. Leone Brand recommendations to check it when she comes back for her follow up appointment.  Advised to call for any questions or concerns.

## 2014-09-29 ENCOUNTER — Ambulatory Visit: Payer: BLUE CROSS/BLUE SHIELD | Admitting: Oncology

## 2014-09-29 ENCOUNTER — Other Ambulatory Visit: Payer: BLUE CROSS/BLUE SHIELD

## 2014-10-05 ENCOUNTER — Other Ambulatory Visit: Payer: BLUE CROSS/BLUE SHIELD

## 2014-10-05 ENCOUNTER — Ambulatory Visit: Payer: BLUE CROSS/BLUE SHIELD | Admitting: Oncology

## 2014-11-26 ENCOUNTER — Telehealth: Payer: Self-pay | Admitting: Oncology

## 2014-11-26 NOTE — Telephone Encounter (Signed)
returned call adn r/s appt per pt request

## 2014-12-22 ENCOUNTER — Ambulatory Visit
Admission: RE | Admit: 2014-12-22 | Discharge: 2014-12-22 | Disposition: A | Discharge status: Home / Self Care | Payer: BLUE CROSS/BLUE SHIELD | Source: Ambulatory Visit | Attending: Oncology | Admitting: Oncology

## 2014-12-22 DIAGNOSIS — Z1231 Encounter for screening mammogram for malignant neoplasm of breast: Secondary | ICD-10-CM

## 2014-12-28 ENCOUNTER — Other Ambulatory Visit: Payer: BLUE CROSS/BLUE SHIELD

## 2014-12-28 ENCOUNTER — Ambulatory Visit: Payer: BLUE CROSS/BLUE SHIELD | Admitting: Oncology

## 2015-01-02 ENCOUNTER — Other Ambulatory Visit: Payer: Self-pay | Admitting: Oncology

## 2015-01-04 ENCOUNTER — Telehealth: Payer: Self-pay | Admitting: Oncology

## 2015-01-04 ENCOUNTER — Encounter: Payer: Self-pay | Admitting: Oncology

## 2015-01-04 ENCOUNTER — Ambulatory Visit (HOSPITAL_BASED_OUTPATIENT_CLINIC_OR_DEPARTMENT_OTHER): Payer: BLUE CROSS/BLUE SHIELD | Admitting: Oncology

## 2015-01-04 ENCOUNTER — Other Ambulatory Visit (HOSPITAL_BASED_OUTPATIENT_CLINIC_OR_DEPARTMENT_OTHER): Payer: BLUE CROSS/BLUE SHIELD | Admitting: *Deleted

## 2015-01-04 ENCOUNTER — Other Ambulatory Visit (HOSPITAL_BASED_OUTPATIENT_CLINIC_OR_DEPARTMENT_OTHER): Payer: BLUE CROSS/BLUE SHIELD

## 2015-01-04 VITALS — BP 110/54 | HR 71 | Temp 97.8°F | Resp 18 | Ht 68.0 in | Wt 167.0 lb

## 2015-01-04 DIAGNOSIS — C561 Malignant neoplasm of right ovary: Secondary | ICD-10-CM

## 2015-01-04 DIAGNOSIS — R5383 Other fatigue: Secondary | ICD-10-CM

## 2015-01-04 DIAGNOSIS — G622 Polyneuropathy due to other toxic agents: Secondary | ICD-10-CM

## 2015-01-04 DIAGNOSIS — Z85828 Personal history of other malignant neoplasm of skin: Secondary | ICD-10-CM

## 2015-01-04 DIAGNOSIS — R970 Elevated carcinoembryonic antigen [CEA]: Secondary | ICD-10-CM

## 2015-01-04 DIAGNOSIS — R7989 Other specified abnormal findings of blood chemistry: Secondary | ICD-10-CM

## 2015-01-04 DIAGNOSIS — Z1211 Encounter for screening for malignant neoplasm of colon: Secondary | ICD-10-CM

## 2015-01-04 DIAGNOSIS — Z79899 Other long term (current) drug therapy: Secondary | ICD-10-CM

## 2015-01-04 DIAGNOSIS — R946 Abnormal results of thyroid function studies: Secondary | ICD-10-CM

## 2015-01-04 DIAGNOSIS — R911 Solitary pulmonary nodule: Secondary | ICD-10-CM | POA: Diagnosis not present

## 2015-01-04 LAB — CBC WITH DIFFERENTIAL/PLATELET
BASO%: 0.5 % (ref 0.0–2.0)
Basophils Absolute: 0 10*3/uL (ref 0.0–0.1)
EOS%: 1.7 % (ref 0.0–7.0)
Eosinophils Absolute: 0.1 10*3/uL (ref 0.0–0.5)
HCT: 36.4 % (ref 34.8–46.6)
HGB: 12.3 g/dL (ref 11.6–15.9)
LYMPH%: 18.2 % (ref 14.0–49.7)
MCH: 30.9 pg (ref 25.1–34.0)
MCHC: 33.9 g/dL (ref 31.5–36.0)
MCV: 91 fL (ref 79.5–101.0)
MONO#: 0.5 10*3/uL (ref 0.1–0.9)
MONO%: 7.5 % (ref 0.0–14.0)
NEUT%: 72.1 % (ref 38.4–76.8)
NEUTROS ABS: 4.3 10*3/uL (ref 1.5–6.5)
Platelets: 193 10*3/uL (ref 145–400)
RBC: 4 10*6/uL (ref 3.70–5.45)
RDW: 13.3 % (ref 11.2–14.5)
WBC: 6 10*3/uL (ref 3.9–10.3)
lymph#: 1.1 10*3/uL (ref 0.9–3.3)

## 2015-01-04 LAB — COMPREHENSIVE METABOLIC PANEL (CC13)
ALT: 15 U/L (ref 0–55)
AST: 24 U/L (ref 5–34)
Albumin: 4 g/dL (ref 3.5–5.0)
Alkaline Phosphatase: 86 U/L (ref 40–150)
Anion Gap: 8 mEq/L (ref 3–11)
BUN: 12.7 mg/dL (ref 7.0–26.0)
CHLORIDE: 103 meq/L (ref 98–109)
CO2: 28 meq/L (ref 22–29)
Calcium: 10 mg/dL (ref 8.4–10.4)
Creatinine: 0.8 mg/dL (ref 0.6–1.1)
EGFR: 76 mL/min/{1.73_m2} — AB (ref >90)
Glucose: 105 mg/dl (ref 70–140)
Potassium: 4.6 mEq/L (ref 3.5–5.1)
Sodium: 140 mEq/L (ref 136–145)
Total Bilirubin: 0.38 mg/dL (ref 0.20–1.20)
Total Protein: 8 g/dL (ref 6.4–8.3)

## 2015-01-04 LAB — TSH CHCC: TSH: 4.585 m(IU)/L — ABNORMAL HIGH (ref 0.308–3.960)

## 2015-01-04 NOTE — Telephone Encounter (Signed)
Appointments made and avs printed,patient will call for her gi appointment

## 2015-01-04 NOTE — Progress Notes (Signed)
OFFICE PROGRESS NOTE   January 04, 2015   Physicians:W.Brewster, M.Wert, (V.Leschber).  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 She saw Dr Skeet Latch 09-24-14 and will see gyn oncology again ~ 3 mo after this appointment. Last imaging was CT AP and PET 09-2012 She is overdue to PCP, has been reassigned from Dr Asa Lente but does not know new physician.  Patient has felt well since she was seen last, with exception of fatigue when she sporadically tries to exercise. She goes to gym 1-2x monthly, walks on treadmill then. She just returned from beach vacation, walked more and was fatigued with this. She is otherwise fairly inactive at home "and I eat because I am bored". One of presenting symptoms with the gyn cancer was fatigue, tho at that time she was exercising very regularly. She denies SOB or chest pain with usual activity. She denies abdominal or pelvic pain. Bowels are moving well, no bladder complaints. She has no bleeding.   No PAC Genetics testing normal (OvaNext panel) 08-2013 Declined flu vaccine today, as she has in past Referral for screening colonoscopy made now   ONCOLOGIC HISTORY  Patient was in usual excellent health thru Sept 2013,then in Oct 2013 more fatigued, with some fecal urge incontinence and abdominal fullness such that she began to diet to lose weight. By Marcene Duos she could not climb one flight of stairs and could walk only from office to car without SOB and coughing. She was seen at urgent care in Dec with bilateral pleural effusions on CXR, referred to Dr Clois Comber, with 1.6 liter right thoracentesis done 03-11-12 (no path in this EMR). She had CT CAP and CT angio chest with large bilateral pleural effusions, no PE, ascites and pelvic mass. She had paracentesis for 1.4 liters on 04-02-12 and left thoracentesis for 2 liters on  04-05-12. CA 125 was 2418. She was seen in consultation by Dr Skeet Latch, with TAH/BSO, radical debulking and omentectomy on 04-09-2012, which was optimal debulking with only residual at completion of procedure being 7 mm area on posterior surface of right lobe liver. She was transfused 2 units PRBCs on 04-10-12 for Hgb down to 7.2 (from 12 preop). Pathology [phone]) high grade serous carcinoma involving right pelvic mass, serosa of uterus and peritoneum, omentum negative, no nodes submitted. Adjuvant therapy with dose dense taxol/ carboplatin began 05-03-12, with CA125 down to 30 by early April. Cycle 6 was completed 09-10-12.   Review of systems as above, also: No history of thyroid problems tho this has not been checked recently. No fever or symptoms of infection. No LE swelling.  Remainder of 10 point Review of Systems negative.  Objective:  Vital signs in last 24 hours:  BP 110/54 mmHg  Pulse 71  Temp(Src) 97.8 F (36.6 C) (Oral)  Resp 18  Ht '5\' 8"'  (1.727 m)  Wt 167 lb (75.751 kg)  BMI 25.40 kg/m2  SpO2 100% Weight up 3 lbs. Alert, oriented and appropriate. Ambulatory without  difficulty. Respirations not labored RA. Very pleasant and easily conversant. Normal hair pattern  HEENT:PERRL, sclerae not icteric. Oral mucosa moist without lesions, posterior pharynx clear.  Neck supple. No JVD.  Lymphatics:no cervical,supraclavicular, axillary or inguinal adenopathy Resp: clear to auscultation bilaterally and normal percussion bilaterally Cardio: regular rate and rhythm. No gallop. GI: soft, nontender, not distended, no mass or organomegaly. Normally active bowel sounds. Surgical  incision not remarkable. Musculoskeletal/ Extremities: without pitting edema, cords, tenderness Neuro: no peripheral neuropathy. Otherwise nonfocal. PSYCH appropriate mood and affect Skin without rash, ecchymosis, petechiae Patient did not undress for breast exam.  Left axilla with 1 cm node mobile and not hard or  tender  Lab Results:  Results for orders placed or performed in visit on 01/04/15  CBC with Differential  Result Value Ref Range   WBC 6.0 3.9 - 10.3 10e3/uL   NEUT# 4.3 1.5 - 6.5 10e3/uL   HGB 12.3 11.6 - 15.9 g/dL   HCT 36.4 34.8 - 46.6 %   Platelets 193 145 - 400 10e3/uL   MCV 91.0 79.5 - 101.0 fL   MCH 30.9 25.1 - 34.0 pg   MCHC 33.9 31.5 - 36.0 g/dL   RBC 4.00 3.70 - 5.45 10e6/uL   RDW 13.3 11.2 - 14.5 %   lymph# 1.1 0.9 - 3.3 10e3/uL   MONO# 0.5 0.1 - 0.9 10e3/uL   Eosinophils Absolute 0.1 0.0 - 0.5 10e3/uL   Basophils Absolute 0.0 0.0 - 0.1 10e3/uL   NEUT% 72.1 38.4 - 76.8 %   LYMPH% 18.2 14.0 - 49.7 %   MONO% 7.5 0.0 - 14.0 %   EOS% 1.7 0.0 - 7.0 %   BASO% 0.5 0.0 - 2.0 %   CMET available after visit  Normal with exception of EGFR calculated 76 TSH added to today's labs and available after visit elevated at 4.585 CA 125 also available after visit elevated at 5  Studies/Results: DIGITAL SCREENING BILATERAL MAMMOGRAM WITH IMPLANTS AND CAD 12-22-14  The patient has retropectoral implants. Standard and implant displaced views were performed.  COMPARISON: Previous exam(s).  ACR Breast Density Category b: There are scattered areas of fibroglandular density.  FINDINGS: There are no findings suspicious for malignancy. Images were processed with CAD.  IMPRESSION: No mammographic evidence of malignancy. A result letter of this screening mammogram will be mailed directly to the patient.  RECOMMENDATION: Screening mammogram in one year.  BI-RADS CATEGORY 1: Negative.     Medications: I have reviewed the patient's current medications.  DISCUSSION:  At time of visit I encouraged patient to get follow up with PCP; I held on phone for that office x 10 min trying to get name of physician, but was unable to get thru lines; patient agreed to try at a time different than Mon AM herself. Plan at time of visit was that we would let her know results of labs  pending. With elevation in CA 125, I will set up CT AP and coordinate visit back either with gyn oncology or at this office.   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 Clinically doing well other than fatigue, however CA 125 today returned up to 88,, this having been 25 in 08-2014 and 14 in 03-2014. Will set up CT CAP and return visit, as this is concerning for recurrent disease.  2. 4 mm RLL pulmonary nodule and bilateral pleural based nodularity 5 mm on right and 10 mm on left with initial scans, seen by Dr Melvyn Novas, no pulmonary symptoms now.  3.anemia resolved  4. mammograms up to date and not remarkable 5. Has not had colonoscopy. Referral to GI done now  6.weight gain since working from home: resume exercising regularly  7.slight preexisting peripheral neuropathy fingertips, not particularly worse with previous taxol 8. 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 Elevated TSH: would not expect this related to the ovarian cancer, likely should have PCP follow up.  10.refused flu vaccine, which we will need to address again if she needs to be back on chemo  All questions answered at time of visit and patient will be contacted by phone as above. Time spent 25 min including >50% counseling and coordination of care. CC    Ferrin Liebig P, MD   01/04/2015, 9:54 AM

## 2015-01-05 ENCOUNTER — Telehealth: Payer: Self-pay | Admitting: Oncology

## 2015-01-05 ENCOUNTER — Telehealth: Payer: Self-pay

## 2015-01-05 DIAGNOSIS — Z1211 Encounter for screening for malignant neoplasm of colon: Secondary | ICD-10-CM | POA: Insufficient documentation

## 2015-01-05 DIAGNOSIS — R5383 Other fatigue: Secondary | ICD-10-CM | POA: Insufficient documentation

## 2015-01-05 DIAGNOSIS — C561 Malignant neoplasm of right ovary: Secondary | ICD-10-CM

## 2015-01-05 LAB — CA 125: CA 125: 88 U/mL — AB (ref <35)

## 2015-01-05 NOTE — Telephone Encounter (Signed)
[facility] Size     Small Medium Large Extra Extra Large    Sera Hitsman  01/05/2015  Telephone  MRN:  [id number]   Description: 59 year old female  Kyndahl Jablon: Gordy Levan, MD  Department: [facility]          Call Documentation      Gordy Levan, MD at 01/05/2015 2:04 PM     Status: Signed       Expand All Collapse All   Medical Oncology  This MD spoke first with [place] primary care: patient seen by Dr Asa Lente last 2015 and still listed with that physician, however appointment availability such that she can be seen more easily by Dr Billey Gosling. That office to contact patient with appointment, particularly in regards to elevated TSH.   This MD also LM for patient to return call to office to speak either to RN or to MD. In addition to letting her know that thyroid tests need to be followed up by PCP as above, need to let her know that CA 125 is elevated, new from last labs in June. She should have CT CAP and be seen back by this MD or Dr Skeet Latch shortly after scans, as the elevated CA 125 is concerning for possible recurrent ovarian cancer. Scans not ordered until we speak with patient.  Godfrey Pick, MD             Encounter MyChart Messages     No messages in this encounter     Created by     Gordy Levan, MD on 01/05/2015 02:04 PM     Visit Pharmacy     [id number] - [place], [place].     Contacts       Type Contact Phone    01/05/2015 02:04 PM Phone ([place]) [place], [place] (Self) [phone] (H)    see documentation

## 2015-01-05 NOTE — Telephone Encounter (Signed)
Spoke with Shelley Sanchez regarding CA-125 elevation as well as f/u on TSH with Dr. Billey Gosling. Shelley Sanchez understandably concerned about ca-125.  She is agreeable to CT scan and f/u with Dr. Marko Plume or Br. Brewster. Placed orders for CT scan and POF for visit as noted below by Dr. Marko Plume.

## 2015-01-05 NOTE — Telephone Encounter (Signed)
Medical Oncology  This MD spoke first with [place] primary care: patient seen by Dr Asa Lente last 2015 and still listed with that physician, however appointment availability such that she can be seen more easily by Dr Billey Gosling. That office to contact patient with appointment, particularly in regards to elevated TSH.   This MD also LM for patient to return call to office to speak either to RN or to MD. In addition to letting her know that thyroid tests need to be followed up by PCP as above, need to let her know that CA 125 is elevated, new from last labs in June. She should have CT CAP and be seen back by this MD or Dr Skeet Latch shortly after scans, as the elevated CA 125 is concerning for possible recurrent ovarian cancer. Scans not ordered until we speak with patient.  Godfrey Pick, MD

## 2015-01-05 NOTE — Telephone Encounter (Signed)
-----   Message from Gordy Levan, MD sent at 01/05/2015  2:14 PM EDT ----- See MD phone note 10-11 if she calls back.  Need to be sure we get in touch with her in next couple of days and get CT scheduled. Page me if better for me to speak with her  Thank you

## 2015-01-06 ENCOUNTER — Telehealth: Payer: Self-pay | Admitting: Oncology

## 2015-01-06 NOTE — Telephone Encounter (Signed)
Called and left a message with a follow up per pof °

## 2015-01-11 ENCOUNTER — Other Ambulatory Visit (INDEPENDENT_AMBULATORY_CARE_PROVIDER_SITE_OTHER): Payer: BLUE CROSS/BLUE SHIELD

## 2015-01-11 ENCOUNTER — Ambulatory Visit (INDEPENDENT_AMBULATORY_CARE_PROVIDER_SITE_OTHER): Payer: BLUE CROSS/BLUE SHIELD | Admitting: Internal Medicine

## 2015-01-11 ENCOUNTER — Ambulatory Visit (HOSPITAL_COMMUNITY)
Admission: RE | Admit: 2015-01-11 | Discharge: 2015-01-11 | Disposition: A | Discharge status: Home / Self Care | Payer: BLUE CROSS/BLUE SHIELD | Source: Ambulatory Visit | Attending: Oncology | Admitting: Oncology

## 2015-01-11 ENCOUNTER — Encounter: Payer: Self-pay | Admitting: Internal Medicine

## 2015-01-11 ENCOUNTER — Telehealth: Payer: Self-pay | Admitting: Oncology

## 2015-01-11 VITALS — BP 114/80 | HR 88 | Temp 97.9°F | Resp 16 | Wt 166.0 lb

## 2015-01-11 DIAGNOSIS — Z0000 Encounter for general adult medical examination without abnormal findings: Secondary | ICD-10-CM

## 2015-01-11 DIAGNOSIS — Z08 Encounter for follow-up examination after completed treatment for malignant neoplasm: Secondary | ICD-10-CM | POA: Diagnosis not present

## 2015-01-11 DIAGNOSIS — R161 Splenomegaly, not elsewhere classified: Secondary | ICD-10-CM | POA: Insufficient documentation

## 2015-01-11 DIAGNOSIS — C561 Malignant neoplasm of right ovary: Secondary | ICD-10-CM

## 2015-01-11 DIAGNOSIS — Z8543 Personal history of malignant neoplasm of ovary: Secondary | ICD-10-CM | POA: Diagnosis present

## 2015-01-11 DIAGNOSIS — E039 Hypothyroidism, unspecified: Secondary | ICD-10-CM

## 2015-01-11 LAB — TSH: TSH: 3.41 u[IU]/mL (ref 0.35–4.50)

## 2015-01-11 LAB — T4, FREE: Free T4: 0.97 ng/dL (ref 0.60–1.60)

## 2015-01-11 MED ORDER — IOHEXOL 300 MG/ML  SOLN
100.0000 mL | Freq: Once | INTRAMUSCULAR | Status: DC | PRN
Start: 2015-01-11 — End: 2015-01-12

## 2015-01-11 MED ORDER — IOHEXOL 300 MG/ML  SOLN
100.0000 mL | Freq: Once | INTRAMUSCULAR | Status: AC | PRN
  Administered 2015-01-11: 100 mL via INTRAVENOUS

## 2015-01-11 NOTE — Patient Instructions (Addendum)
  We have reviewed your prior records including labs and tests today.  Test(s) ordered today. Your results will be released to [place] (or called to you) after review, usually within 72hours after test completion. If any changes need to be made, you will be notified at that same time.  All other Health Maintenance issues reviewed.   All recommended immunizations and age-appropriate screenings are up-to-date or discussed.  No immunizations administered today.   Medications reviewed and updated.     No changes recommended at this time.                 A referral was ordered for GI for a screening colonoscopy.

## 2015-01-11 NOTE — Progress Notes (Signed)
Pre visit review using our clinic review tool, if applicable. No additional management support is needed unless otherwise documented below in the visit note. 

## 2015-01-11 NOTE — Telephone Encounter (Signed)
Medical Oncology  This MD spoke directly with Dr Abigail Miyamoto, who read CT CAP 01-11-15, large mass at spleen. Spoke then with IR Dr Corrie Mckusick, who feels the area could be biopsied, tho increased risk of bleeding.  I have also let gyn oncology know situation, as they may want to see patient or other recommendations.  Godfrey Pick, MD

## 2015-01-11 NOTE — Progress Notes (Signed)
Subjective:    Patient ID: Shelley Sanchez, female    DOB: September 10, 1955, 59 y.o.   MRN: [id number]  HPI Her oncologist made her this appintment because her tsh was slightly elevated.  She has been told her thyroid was a little enlarged in the past.  She has a history of ovarian cancer and follows with oncology.  Some of her symptoms she feels are related to the cancer and chemotherapy.   She does have fatigue, weight gain, and changes in her hair.  She hair is falling out and her hair stylist thought it was breaking.  The weight gain may be from how she was eating. She denies constipation.    Medications and allergies reviewed with patient and updated if appropriate.  Patient Active Problem List   Diagnosis Date Noted  . Screening for colon cancer 01/05/2015  . Other fatigue 01/05/2015  . Elevated TSH 01/05/2015  . Multiple pulmonary nodules 01/06/2014  . Squamous cell carcinoma of right hand   . Ovarian cancer on right ([place]) 05/21/2012  . Hypothyroid 03/07/2012    Past Medical History  Diagnosis Date  . Squamous cell carcinoma of right hand 2006    s/p excision  . Ascites 03/2012    on CT a/p; s/p paracentesis  . Ovarian cancer ([place]) 03/2012 dx    s/p debulk = stage III C poor diff, ov serous ca  . Family history of ovarian cancer   . 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 Breast enhancement surgery  2000  . Laparotomy  04/09/2012    Procedure: EXPLORATORY LAPAROTOMY;  Surgeon: [name], MD PHD;  Location: [facility];  Service: Gynecology;  Laterality: N/A;  EXPLORATORY LAPAROTOMY, TOTAL ABDOMINAL HYESTERCTOMY, BILATERAL SALPINGO OOPHERECTOMY  . Abdominal hysterectomy  04/09/2012    Procedure: HYSTERECTOMY ABDOMINAL;  Surgeon: [name], MD PHD;  Location: [facility];  Service: Gynecology;  Laterality: N/A;    Social History   Social History  . Marital Status: Widowed    Spouse Name: N/A  .  Number of Children: 3  . Years of Education: N/A   Social History Main Topics  . Smoking status: Never Smoker   . Smokeless tobacco: Never Used  . Alcohol Use: No  . Drug Use: No  . Sexual Activity: Yes   Other Topics Concern  . 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 related to steroids). Negative for fever and chills.  Respiratory: Negative for cough, shortness of breath and wheezing.   Cardiovascular: Negative for chest pain, palpitations and leg swelling.  Gastrointestinal: Negative for constipation.  Endocrine: Negative for cold intolerance and heat intolerance.  Skin:       hair falling out, breaking per hair stylist - diffuse  Psychiatric/Behavioral: Positive for dysphoric mood (some depression recently).       Objective:   Filed Vitals:   01/11/15 1024  BP: 114/80  Pulse: 88  Temp: 97.9 F (36.6 C)  Resp: 16   Filed Weights   01/11/15 1024  Weight: 166 lb (75.297 kg)   Body mass index is 25.25 kg/(m^2).   Physical Exam  Constitutional: She is oriented to person, place, and time. She appears well-developed and well-nourished. No distress.  HENT:  Head: Normocephalic and atraumatic.  Neck: Neck supple. No tracheal deviation present. Thyromegaly (mild withou nodules) present.  Cardiovascular: Normal rate, regular rhythm and normal heart sounds.   No murmur heard. Pulmonary/Chest: Effort normal and breath sounds normal. No respiratory distress. She has no wheezes.  Musculoskeletal: She exhibits no edema.  Lymphadenopathy:    She has no cervical adenopathy.  Neurological: She is alert and oriented to person, place, and time.  Skin: Skin is warm and dry.  Psychiatric: She has a normal mood and affect.  Her behavior is normal.        Assessment & Plan:   Elevated tsh, hypothyroid He recent tsh was elevated and she is having symptoms consistent with hypothyroidism It looks like she was on synthroid in 2013 Recheck tfts, include ft4  Check thyroid antibodies If hypothyroid will start levothyroxine - she does want to try it  Also discussed that she needs to have a colonoscopy and she agrees to have one -- will refer to GI   Binnie Rail, MD

## 2015-01-12 ENCOUNTER — Other Ambulatory Visit: Payer: Self-pay | Admitting: Oncology

## 2015-01-12 LAB — HEPATITIS C ANTIBODY: HCV Ab: NEGATIVE

## 2015-01-12 LAB — THYROID ANTIBODIES
THYROGLOBULIN AB: 2 [IU]/mL — AB (ref <2)
Thyroperoxidase Ab SerPl-aCnc: 433 IU/mL — ABNORMAL HIGH (ref <9)

## 2015-01-13 ENCOUNTER — Telehealth: Payer: Self-pay | Admitting: Internal Medicine

## 2015-01-13 ENCOUNTER — Telehealth: Payer: Self-pay | Admitting: Oncology

## 2015-01-13 NOTE — Telephone Encounter (Signed)
LVM for pt to call back.

## 2015-01-13 NOTE — Telephone Encounter (Signed)
Medical Oncology  Spoke with [facility]: Dr Barry Dienes out until 01-18-15  L.Marko Plume, MD

## 2015-01-13 NOTE — Telephone Encounter (Signed)
Let her know her hepatitis c test is neg.  Her thyroid function is now in the normal range.  Her antibodies for the thyroid are positive so that means more likely than not she will need thyroid medication in the future.  We can wait to start the medication and just monitor her blood work at least twice a year or we can start a low dose of the medication now and see if her symptoms improve.  Let me know.  If we start the medication now I will send a prescription to her pharmacy and she will need to have repeat blood work in 6 weeks to recheck her thyroid function

## 2015-01-13 NOTE — Telephone Encounter (Signed)
Spoke with pt. She stated that is is going to see her Onocologist and will wait until after that appt to decide whether or not to take the thyroid medication

## 2015-01-14 ENCOUNTER — Encounter: Payer: Self-pay | Admitting: Oncology

## 2015-01-14 ENCOUNTER — Other Ambulatory Visit: Payer: Self-pay | Admitting: Oncology

## 2015-01-14 ENCOUNTER — Ambulatory Visit (HOSPITAL_BASED_OUTPATIENT_CLINIC_OR_DEPARTMENT_OTHER): Payer: BLUE CROSS/BLUE SHIELD | Admitting: Oncology

## 2015-01-14 VITALS — BP 124/68 | HR 100 | Temp 98.0°F | Resp 18 | Ht 68.0 in | Wt 166.9 lb

## 2015-01-14 DIAGNOSIS — G629 Polyneuropathy, unspecified: Secondary | ICD-10-CM

## 2015-01-14 DIAGNOSIS — Z7185 Encounter for immunization safety counseling: Secondary | ICD-10-CM

## 2015-01-14 DIAGNOSIS — C561 Malignant neoplasm of right ovary: Secondary | ICD-10-CM

## 2015-01-14 DIAGNOSIS — Z23 Encounter for immunization: Secondary | ICD-10-CM | POA: Diagnosis not present

## 2015-01-14 DIAGNOSIS — Z85828 Personal history of other malignant neoplasm of skin: Secondary | ICD-10-CM

## 2015-01-14 DIAGNOSIS — C7889 Secondary malignant neoplasm of other digestive organs: Secondary | ICD-10-CM

## 2015-01-14 DIAGNOSIS — R978 Other abnormal tumor markers: Secondary | ICD-10-CM

## 2015-01-14 DIAGNOSIS — Z7189 Other specified counseling: Secondary | ICD-10-CM

## 2015-01-14 MED ORDER — HAEMOPHILUS B POLYSAC CONJ VAC IM SOLR
0.5000 mL | Freq: Once | INTRAMUSCULAR | Status: AC
  Administered 2015-01-14: 0.5 mL via INTRAMUSCULAR
  Filled 2015-01-14: qty 0.5

## 2015-01-14 MED ORDER — PNEUMOCOCCAL VAC POLYVALENT 25 MCG/0.5ML IJ INJ
0.5000 mL | INJECTION | Freq: Once | INTRAMUSCULAR | Status: AC
  Administered 2015-01-14: 0.5 mL via INTRAMUSCULAR
  Filled 2015-01-14: qty 0.5

## 2015-01-14 MED ORDER — MENINGOCOCCAL VAC A,C,Y,W-135 ~~LOC~~ INJ
0.5000 mL | INJECTION | Freq: Once | SUBCUTANEOUS | Status: AC
  Administered 2015-01-14: 0.5 mL via SUBCUTANEOUS
  Filled 2015-01-14: qty 0.5

## 2015-01-14 NOTE — Patient Instructions (Signed)
Dr Ralene Ok with [facility] Surgery [address] Thurs 10-27  arrive @ 9:10 for paperwork, to see MD at 9:40  After you meet with surgeon, please call Dr [name] nurse  [phone]) to let us know what he says and when you might have surgery

## 2015-01-14 NOTE — Progress Notes (Signed)
OFFICE PROGRESS NOTE   January 14, 2015   Physicians:W.Brewster, M.Wert, V.Leschber/ Billey Gosling  New patient referral to [facility], to see Dr Ralene Ok 01-21-15   Prior to visit the findings on CT were communicated to Dr Skeet Latch, and I have also spoken with her directly today. CT findings were reviewed with IR prior to visit: biopsy of area at spleen possible tho certainly significant risk of bleeding  Pre-splenectomy vaccines confirmed with [facility] pharmacist. I have spoken directly with [facility], Dr Barry Dienes away, however Dr Rosendo Gros able to see patient next week.    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  Patient was doing well at Dr Leone Brand follow up 09-24-14, with CA 125 of 25. I saw her 01-04-15 in scheduled followup, at which time she reported fatigue with exertion such as walking several miles on recent beach vacation, tho she had not been doing any regular exercise prior to the vacation; exam not remarkable, however CA 125 was 88. CT CAP 01-11-15 shows likely recurrent ovarian cancer in area of spleen, without evidence of disease elsewhere. Beginning shortly after my last visit, she noticed discomfort LUQ and left upper flank with deep inspiration and some positions. She has not needed any pain medication for this. She does not notice any fatigue with regular activity now, no SOB or cough. Appetite is good, no fever or sweats, no bleeding. Bowels are moving regularly.  SHe had f/u of thyroid by PCP since my last visit, repeat testing ok.  No PAC Genetics testing normal (OvaNext panel) 08-2013 Pneumovax, meningococcal vaccine and Hemophilus influenza given today in anticipation of possible  splenectomy Patient will return to office for flu vaccine next week  ONCOLOGIC HISTORY Patient was in usual excellent health thru Sept 2013,then in Oct 2013 more fatigued, with some fecal urge incontinence and abdominal fullness such that she began to diet to lose weight. By Marcene Duos she could not climb one flight of stairs and could walk only from office to car without SOB and coughing. She was seen at urgent care in Dec with bilateral pleural effusions on CXR, referred to Dr Clois Comber, with 1.6 liter right thoracentesis done 03-11-12 (no path in this EMR). She had CT CAP and CT angio chest with large bilateral pleural effusions, no PE, ascites and pelvic mass. She had paracentesis for 1.4 liters on 04-02-12 and left thoracentesis for 2 liters on 04-05-12. CA 125 was 2418. She was seen in consultation by Dr Skeet Latch, with TAH/BSO, radical debulking and omentectomy on 04-09-2012, which was optimal debulking with only residual at completion of procedure being 7 mm area on posterior surface of right lobe liver. She was transfused 2 units PRBCs on 04-10-12 for Hgb down to 7.2 (from 12 preop). Pathology [phone]) high grade serous carcinoma involving right pelvic mass, serosa of uterus and peritoneum, omentum negative, no nodes submitted, felt IIIC right ovarian primary.  Adjuvant therapy with dose dense taxol/ carboplatin began 05-03-12, with CA125 down to 30 by early April. Cycle 6 was completed 09-10-12. CA 125 remained in good range, including 14 in 03-2009 and 25 on 09-24-14, however was 88 in early Oct 2016. CT CAP 01-11-15 found mass at anterior aspect of spleen 11.5 x 8.2 x 13  cm, with stable unremarkable chest, no ascites, no adenopathy, no pelvic masses.     Review of systems as above, also: No symptoms of infection. Bladder ok. No LE swelling. Remainder of 10 point Review of Systems negative.  Objective:  Vital signs in last 24 hours:  BP 124/68 mmHg  Pulse 100  Temp(Src) 98 F (36.7 C) (Oral)   Resp 18  Ht 5\' 8"  (1.727 m)  Wt 166 lb 14.4 oz (75.705 kg)  BMI 25.38 kg/m2  SpO2 99% Weight stable Alert, oriented and appropriate. Ambulatory without difficulty, changes positions easily on exam table No alopecia  HEENT:PERRL, sclerae not icteric. Oral mucosa moist without lesions, posterior pharynx clear.  Neck supple. No JVD.  Lymphatics:no cervical,supraclavicular, axillary or inguinal adenopathy Resp: clear to auscultation bilaterally and normal percussion bilaterally Cardio: regular rate and rhythm. No gallop. GI: soft, nontender, not distended, full LUQ no tenderness. Normally active bowel sounds. Surgical incision not remarkable. Musculoskeletal/ Extremities: without pitting edema, cords, tenderness Neuro: no peripheral neuropathy. Otherwise nonfocal. PSYCH appropriate mood and affect Skin without rash, ecchymosis, petechiae   Lab Results:  Results for orders placed or performed in visit on 01/11/15  T4, free  Result Value Ref Range   Free T4 0.97 0.60 - 1.60 ng/dL  TSH  Result Value Ref Range   TSH 3.41 0.35 - 4.50 uIU/mL  Thyroid antibodies  Result Value Ref Range   Thyroperoxidase Ab SerPl-aCnc 433 (H) <9 IU/mL   Thyroglobulin Ab 2 (H) <2 IU/mL  Hepatitis C antibody  Result Value Ref Range   HCV Ab NEGATIVE NEGATIVE     Studies/Results: EXAM: CT CHEST, ABDOMEN, AND PELVIS WITH CONTRAST  TECHNIQUE: Multidetector CT imaging of the chest, abdomen and pelvis was performed following the standard protocol during bolus administration of intravenous contrast.  CONTRAST: 152mL OMNIPAQUE IOHEXOL 300 MG/ML SOLN  COMPARISON: PET of 10/15/2012. Abdominal pelvic CT of 10/08/2012. Chest CT of 04/01/2012.  FINDINGS: CT CHEST FINDINGS  Mediastinum/Nodes: No supraclavicular adenopathy. Normal heart size, without pericardial effusion. No central pulmonary embolism, on this non-dedicated study. No mediastinal or hilar adenopathy. Bilateral breast  implants.  Lungs/Pleura: No pleural fluid. There is trace left pleural thickening, similar.  1 mm subpleural right lower lobe pulmonary nodule on image/series 32/4, similar. No lobar consolidation.  Musculoskeletal: No acute osseous abnormality.  CT ABDOMEN PELVIS FINDINGS  Hepatobiliary: Dominant right hepatic lobe cyst at 3.6 cm. Other scattered too small to characterize liver lesions which are similar. Normal gallbladder, without biliary ductal dilatation.  Pancreas: Normal, without mass or ductal dilatation.  Spleen: A mass centered about the anterior aspect of the spleen measures 11.5 by 8.2 cm on image/series 54/2. Borderline splenomegaly, 13.0 cm craniocaudal.  Adrenals/Urinary Tract: Normal adrenal glands. Normal kidneys, without hydronephrosis. Normal urinary bladder.  Stomach/Bowel: Proximal gastric underdistention. Normal colon and terminal ileum. Normal small bowel.  Vascular/Lymphatic: No abdominopelvic adenopathy.  Reproductive: Hysterectomy. No adnexal mass.  Other: No significant free fluid. No other evidence of omental/peritoneal metastasis.  Musculoskeletal: Vague area of sclerosis within the left iliac which is similar and likely benign. Prominent endplate osteophytes at L1-2.  IMPRESSION: 1. Large left anterior splenic mass, most consistent with metastatic disease. A metachronous primary such as lymphoma (given absence of recurrent or metastatic disease elsewhere) is possible but considered less likely. Consider tissue sampling. 2. No acute process or evidence of metastatic disease in the chest.   PACs images reviewed with patient at this visit.   Medications: I have reviewed the patient's current medications.  DISCUSSION: with apparent recurrent ovarian cancer localized to area of spleen now out over 2 years from completion of adjuvant therapy, Dr Skeet Latch and I have discussed splenectomy, which we feel would be appropriate now  if general surgery feels that this can be done, as this may give best long term control. Alternative would be to start with chemotherapy, then consider splenectomy later depending on response. She may still need some chemotherapy following splenectomy if that is initial intervention.  I am reluctant to attempt diagnostic biopsy due to location, Dr Skeet Latch agrees.  All of above discussed at length with patient now. I have reviewed role of spleen in immune response for various infections. She is comfortable with whichever treatment is recommended, would be glad to have splenectomy as initial intervention if surgery agrees. We have given pre-splenectomy immunizations today, rationale discussed and consent given.  She also agrees now with flu vaccine, understands that it is not a live virus so cannot cause influenza, tho she feels that she had associated illness after a flu vaccine some years ago. We have decided to give the flu vaccine at this office next week rather than together with the HiB, meningococcal and pneumococcal 23 vaccines today.  Patient is able to keep visit with Dr Rosendo Gros 01-21-15, which is first available at [place]. Hopefully will be able to proceed with splenectomy in near future if surgeon agrees, otherwise will need to begin chemotherapy soon. Patient is to speak with my RN after 01-21-15 visit, and knows that I am glad to speak with Dr Rosendo Gros, as is Dr Skeet Latch if needed.   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 New elevation in CA 125 to 88 and CT with mass at spleen as only obvious involvement.Refer to general surgery for possible splenectomy. Pre-splenectomy vaccines given 01-14-15. Likely will need additional chemotherapy , timing to depend on possible splenectomy. 2. For flu vaccine at [facility] next week 3. Repeat TFTs by PCP ok 4. mammograms up to  date and not remarkable 5. Has not had colonoscopy. Referral to GI done previously, may need to hold on this until rest of situation better controlled  6.slight preexisting peripheral neuropathy fingertips, not particularly worse with previous taxol 7. 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 Patient knows to call if concerns at any time, and will be in touch with my RN after consultation with general surgery.  Time spent 60 min including >50% counseling and coordination of care. CC this note Drs Skeet Latch, Neoma Laming.  LIVESAY,LENNIS P, MD   01/14/2015, 5:00 PM

## 2015-01-15 ENCOUNTER — Telehealth: Payer: Self-pay | Admitting: Oncology

## 2015-01-15 NOTE — Telephone Encounter (Signed)
Called and left a message with inj appointments

## 2015-01-17 DIAGNOSIS — C7889 Secondary malignant neoplasm of other digestive organs: Secondary | ICD-10-CM | POA: Insufficient documentation

## 2015-01-17 DIAGNOSIS — Z7189 Other specified counseling: Secondary | ICD-10-CM | POA: Insufficient documentation

## 2015-01-17 DIAGNOSIS — Z7185 Encounter for immunization safety counseling: Secondary | ICD-10-CM | POA: Insufficient documentation

## 2015-01-17 DIAGNOSIS — R978 Other abnormal tumor markers: Secondary | ICD-10-CM | POA: Insufficient documentation

## 2015-01-18 ENCOUNTER — Telehealth: Payer: Self-pay | Admitting: *Deleted

## 2015-01-18 ENCOUNTER — Other Ambulatory Visit: Payer: Self-pay | Admitting: Oncology

## 2015-01-18 DIAGNOSIS — C561 Malignant neoplasm of right ovary: Secondary | ICD-10-CM

## 2015-01-18 DIAGNOSIS — C7889 Secondary malignant neoplasm of other digestive organs: Secondary | ICD-10-CM

## 2015-01-18 NOTE — Telephone Encounter (Signed)
-----   Message from Gordy Levan, MD sent at 01/18/2015 10:45 AM EDT ----- Please let patient know that gyn oncologist and I would like PET to look her over in addition to the CT already done. We discussed her case at the gyn onc multidisciplinary conference this AM and I have started the preauthorization process for PET. We may change the surgery appointment to a different day, but she should keep the appointment as scheduled if we do not tell her otherwise prior.  Please ask if she did ok with immunizations on 10-27 ( given in anticipation of splenectomy) and if she needs anything for discomfort in spleen area. She is for flu shot here on 10-25  thanks

## 2015-01-18 NOTE — Telephone Encounter (Signed)
Left message for patient to call Dr [name] nurse

## 2015-01-19 ENCOUNTER — Ambulatory Visit (HOSPITAL_BASED_OUTPATIENT_CLINIC_OR_DEPARTMENT_OTHER): Payer: BLUE CROSS/BLUE SHIELD

## 2015-01-19 ENCOUNTER — Telehealth: Payer: Self-pay | Admitting: Oncology

## 2015-01-19 VITALS — BP 112/67 | HR 92 | Temp 98.3°F

## 2015-01-19 DIAGNOSIS — Z23 Encounter for immunization: Secondary | ICD-10-CM

## 2015-01-19 MED ORDER — INFLUENZA VAC SPLIT QUAD 0.5 ML IM SUSY
0.5000 mL | PREFILLED_SYRINGE | Freq: Once | INTRAMUSCULAR | Status: AC
  Administered 2015-01-19: 0.5 mL via INTRAMUSCULAR
  Filled 2015-01-19: qty 0.5

## 2015-01-19 NOTE — Telephone Encounter (Signed)
MEDICAL ONCOLOGY  Per [facility] Surgery, appointment moved to Dr Barry Dienes on 01-22-15  L.Marko Plume, MD

## 2015-01-19 NOTE — Telephone Encounter (Signed)
S/w pt about message below. Pt is doing well with immunizations from 10/27. She is coming in today 1045 for flu shot. She has oxycodone/APAP 5-325 left from 2014. She uses this very rarely for pain. She does have a small ache in her L side. She has an appt with Dr Barry Dienes on Friday at 0800. She will await a call for the PET.

## 2015-01-20 ENCOUNTER — Other Ambulatory Visit: Payer: Self-pay | Admitting: General Surgery

## 2015-01-20 ENCOUNTER — Encounter: Payer: Self-pay | Admitting: Gynecologic Oncology

## 2015-01-20 NOTE — Progress Notes (Signed)
Gynecologic Oncology Multi-Disciplinary Disposition Conference Note  Date of the Conference: January 18, 2015  Patient Name: Shelley Sanchez  Primary GYN Oncologist: Dr. 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  Disposition is to PET to evaluate to extent of recurrence.  If localized to spleen, recommend splenectomy by General Surgery.  Carboplatin and taxol for 6 cycles post-operatively.   This Multidisciplinary conference took place involving physicians from [place], [place], Radiation Oncology, Pathology, Radiology along with the Gynecologic Oncology Nurse Practitioner and RN.  Comprehensive assessment of the patient's malignancy, staging, need for surgery, chemotherapy, radiation therapy, and need for further testing were reviewed. Supportive measures, both inpatient and following discharge were also discussed. The recommended plan of care is documented. Greater than 35 minutes were spent correlating and coordinating this patient's care.

## 2015-01-22 ENCOUNTER — Telehealth: Payer: Self-pay | Admitting: Oncology

## 2015-01-22 ENCOUNTER — Telehealth: Payer: Self-pay

## 2015-01-22 ENCOUNTER — Other Ambulatory Visit: Payer: Self-pay | Admitting: Oncology

## 2015-01-22 NOTE — Telephone Encounter (Signed)
Dr Marko Plume asked if PET was prior approved yet to move it before the 11/8 surgery schedule. Secily Walthour is working on it.

## 2015-01-22 NOTE — Telephone Encounter (Signed)
Spoke with patient and she is aware of her follow up °

## 2015-01-22 NOTE — Telephone Encounter (Signed)
Pt called to let us know her surgery with Dr Barry Dienes is scheduled for Nov 8.

## 2015-01-25 ENCOUNTER — Other Ambulatory Visit: Payer: Self-pay | Admitting: Oncology

## 2015-01-25 ENCOUNTER — Encounter: Payer: Self-pay | Admitting: Oncology

## 2015-01-25 ENCOUNTER — Telehealth: Payer: Self-pay | Admitting: Oncology

## 2015-01-25 NOTE — Telephone Encounter (Signed)
Spoke with Shelley Sanchez regarding the PET scan as noted below by Dr. Marko Plume.  Ms. Sporer verbalized understanding.

## 2015-01-25 NOTE — Patient Instructions (Addendum)
Shelley Sanchez  01/25/2015   Your procedure is scheduled on: 02/02/2015   Report to [facility]  Entrance take [facility]  elevators to 3rd floor to  [place] at  1100 AM.  Call this number if you have problems the morning of surgery [phone]   Remember: ONLY 1 PERSON MAY GO WITH YOU TO SHORT STAY TO GET  READY MORNING OF [place].  Do not eat food or drink liquids :After Midnight.     Take these medicines the morning of surgery with A SIP OF WATER: percocet if needed                                 You may not have any metal on your body including hair pins and              piercings  Do not wear jewelry, make-up, lotions, powders or perfumes, deodorant             Do not wear nail polish.  Do not shave  48 hours prior to surgery.                 Do not bring valuables to the hospital. [place].  Contacts, dentures or bridgework may not be worn into surgery.  Leave suitcase in the car. After surgery it may be brought to your room.         Special Instructions  COUGHING AND DEEP BREATHING EXERCISES, LEG EXERCISES               Please read over the following fact sheets you were given: _____________________________________________________________________             [facility] - Preparing for Surgery Before surgery, you can play an important role.  Because skin is not sterile, your skin needs to be as free of germs as possible.  You can reduce the number of germs on your skin by washing with CHG (chlorahexidine gluconate) soap before surgery.  CHG is an antiseptic cleaner which kills germs and bonds with the skin to continue killing germs even after washing. Please DO NOT use if you have an allergy to CHG or antibacterial soaps.  If your skin becomes reddened/irritated stop using the CHG and inform your nurse when you arrive at Short Stay. Do not shave (including legs and underarms) for at  least 48 hours prior to the first CHG shower.  You may shave your face/neck. Please follow these instructions carefully:  1.  Shower with CHG Soap the night before surgery and the  morning of Surgery.  2.  If you choose to wash your hair, wash your hair first as usual with your  normal  shampoo.  3.  After you shampoo, rinse your hair and body thoroughly to remove the  shampoo.                           4.  Use CHG as you would any other liquid soap.  You can apply chg directly  to the skin and wash  Gently with a scrungie or clean washcloth.  5.  Apply the CHG Soap to your body ONLY FROM THE NECK DOWN.   Do not use on face/ open                           Wound or open sores. Avoid contact with eyes, ears mouth and genitals (private parts).                       Wash face,  Genitals (private parts) with your normal soap.             6.  Wash thoroughly, paying special attention to the area where your surgery  will be performed.  7.  Thoroughly rinse your body with warm water from the neck down.  8.  DO NOT shower/wash with your normal soap after using and rinsing off  the CHG Soap.                9.  Pat yourself dry with a clean towel.            10.  Wear clean pajamas.            11.  Place clean sheets on your bed the night of your first shower and do not  sleep with pets. Day of Surgery : Do not apply any lotions/deodorants the morning of surgery.  Please wear clean clothes to the hospital/surgery center.  FAILURE TO FOLLOW THESE INSTRUCTIONS MAY RESULT IN THE CANCELLATION OF YOUR SURGERY PATIENT SIGNATURE_________________________________  NURSE SIGNATURE__________________________________  ________________________________________________________________________  WHAT IS A BLOOD TRANSFUSION? Blood Transfusion Information  A transfusion is the replacement of blood or some of its parts. Blood is made up of multiple cells which provide different functions.  Red  blood cells carry oxygen and are used for blood loss replacement.  White blood cells fight against infection.  Platelets control bleeding.  Plasma helps clot blood.  Other blood products are available for specialized needs, such as hemophilia or other clotting disorders. BEFORE THE TRANSFUSION  Who gives blood for transfusions?   Healthy volunteers who are fully evaluated to make sure their blood is safe. This is blood bank blood. Transfusion therapy is the safest it has ever been in the practice of medicine. Before blood is taken from a donor, a complete history is taken to make sure that person has no history of diseases nor engages in risky social behavior (examples are intravenous drug use or sexual activity with multiple partners). The donor's travel history is screened to minimize risk of transmitting infections, such as malaria. The donated blood is tested for signs of infectious diseases, such as HIV and hepatitis. The blood is then tested to be sure it is compatible with you in order to minimize the chance of a transfusion reaction. If you or a relative donates blood, this is often done in anticipation of surgery and is not appropriate for emergency situations. It takes many days to process the donated blood. RISKS AND COMPLICATIONS Although transfusion therapy is very safe and saves many lives, the main dangers of transfusion include:   Getting an infectious disease.  Developing a transfusion reaction. This is an allergic reaction to something in the blood you were given. Every precaution is taken to prevent this. The decision to have a blood transfusion has been considered carefully by your caregiver before blood is given. Blood is not given unless the benefits outweigh  the risks. AFTER THE TRANSFUSION  Right after receiving a blood transfusion, you will usually feel much better and more energetic. This is especially true if your red blood cells have gotten low (anemic). The  transfusion raises the level of the red blood cells which carry oxygen, and this usually causes an energy increase.  The nurse administering the transfusion will monitor you carefully for complications. HOME CARE INSTRUCTIONS  No special instructions are needed after a transfusion. You may find your energy is better. Speak with your caregiver about any limitations on activity for underlying diseases you may have. SEEK MEDICAL CARE IF:   Your condition is not improving after your transfusion.  You develop redness or irritation at the intravenous (IV) site. SEEK IMMEDIATE MEDICAL CARE IF:  Any of the following symptoms occur over the next 12 hours:  Shaking chills.  You have a temperature by mouth above 102 F (38.9 C), not controlled by medicine.  Chest, back, or muscle pain.  People around you feel you are not acting correctly or are confused.  Shortness of breath or difficulty breathing.  Dizziness and fainting.  You get a rash or develop hives.  You have a decrease in urine output.  Your urine turns a dark color or changes to pink, red, or brown. Any of the following symptoms occur over the next 10 days:  You have a temperature by mouth above 102 F (38.9 C), not controlled by medicine.  Shortness of breath.  Weakness after normal activity.  The white part of the eye turns yellow (jaundice).  You have a decrease in the amount of urine or are urinating less often.  Your urine turns a dark color or changes to pink, red, or brown. Document Released: 03/10/2000 Document Revised: 06/05/2011 Document Reviewed: 10/28/2007 [facility] Patient Information 2014 [place], [place].  _______________________________________________________________________

## 2015-01-25 NOTE — Progress Notes (Signed)
Medical Oncology  Insurance denied PET due to CT Oct unremarkable other than spleen. Have cancelled order for PET and will let patient know. WIll reorder later if needed, but for now will utilize CT information.  Godfrey Pick, MD

## 2015-01-25 NOTE — Telephone Encounter (Signed)
Medical Oncology  Information from managed care that her insurance has denied PET due to CT in Oct. Order for PET cancelled.  LM for patient to call back to Dr [name] RN. Need to let her know that we cannot get the PET now, but ok to proceed with splenectomy as planned. We can try again with PET later if needed.  Godfrey Pick, MD

## 2015-01-26 ENCOUNTER — Other Ambulatory Visit (HOSPITAL_COMMUNITY): Payer: BLUE CROSS/BLUE SHIELD

## 2015-01-26 ENCOUNTER — Encounter (HOSPITAL_COMMUNITY): Payer: Self-pay

## 2015-01-26 ENCOUNTER — Encounter (HOSPITAL_COMMUNITY)
Admission: RE | Admit: 2015-01-26 | Discharge: 2015-01-26 | Disposition: A | Discharge status: Home / Self Care | Payer: BLUE CROSS/BLUE SHIELD | Source: Ambulatory Visit | Attending: General Surgery | Admitting: General Surgery

## 2015-01-26 DIAGNOSIS — Z01818 Encounter for other preprocedural examination: Secondary | ICD-10-CM | POA: Insufficient documentation

## 2015-01-26 DIAGNOSIS — C7889 Secondary malignant neoplasm of other digestive organs: Secondary | ICD-10-CM | POA: Diagnosis not present

## 2015-01-26 HISTORY — DX: Thyrotoxicosis, unspecified without thyrotoxic crisis or storm: E05.90

## 2015-01-26 HISTORY — DX: Headache: R51

## 2015-01-26 HISTORY — DX: Headache, unspecified: R51.9

## 2015-01-26 LAB — CBC WITH DIFFERENTIAL/PLATELET
Basophils Absolute: 0 10*3/uL (ref 0.0–0.1)
Basophils Relative: 0 %
Eosinophils Absolute: 0.2 10*3/uL (ref 0.0–0.7)
Eosinophils Relative: 3 %
HEMATOCRIT: 38.9 % (ref 36.0–46.0)
Hemoglobin: 12.5 g/dL (ref 12.0–15.0)
LYMPHS PCT: 28 %
Lymphs Abs: 1.6 10*3/uL (ref 0.7–4.0)
MCH: 30.5 pg (ref 26.0–34.0)
MCHC: 32.1 g/dL (ref 30.0–36.0)
MCV: 94.9 fL (ref 78.0–100.0)
MONO ABS: 0.4 10*3/uL (ref 0.1–1.0)
MONOS PCT: 7 %
NEUTROS ABS: 3.4 10*3/uL (ref 1.7–7.7)
Neutrophils Relative %: 62 %
Platelets: 184 10*3/uL (ref 150–400)
RBC: 4.1 MIL/uL (ref 3.87–5.11)
RDW: 13.4 % (ref 11.5–15.5)
WBC: 5.5 10*3/uL (ref 4.0–10.5)

## 2015-01-26 LAB — TYPE AND SCREEN
ABO/RH(D): B NEG
Antibody Screen: NEGATIVE

## 2015-01-26 LAB — URINE MICROSCOPIC-ADD ON

## 2015-01-26 LAB — BASIC METABOLIC PANEL
ANION GAP: 7 (ref 5–15)
BUN: 15 mg/dL (ref 6–20)
CHLORIDE: 104 mmol/L (ref 101–111)
CO2: 30 mmol/L (ref 22–32)
Calcium: 9.7 mg/dL (ref 8.9–10.3)
Creatinine, Ser: 0.87 mg/dL (ref 0.44–1.00)
GFR calc Af Amer: >60 mL/min (ref >60)
GFR calc non Af Amer: >60 mL/min (ref >60)
GLUCOSE: 101 mg/dL — AB (ref 65–99)
POTASSIUM: 4.4 mmol/L (ref 3.5–5.1)
Sodium: 141 mmol/L (ref 135–145)

## 2015-01-26 LAB — URINALYSIS, ROUTINE W REFLEX MICROSCOPIC
BILIRUBIN URINE: NEGATIVE
GLUCOSE, UA: NEGATIVE mg/dL
KETONES UR: NEGATIVE mg/dL
LEUKOCYTES UA: NEGATIVE
Nitrite: NEGATIVE
PROTEIN: NEGATIVE mg/dL
Specific Gravity, Urine: 1.012 (ref 1.005–1.030)
Urobilinogen, UA: 0.2 mg/dL (ref 0.0–1.0)
pH: 5 (ref 5.0–8.0)

## 2015-01-26 LAB — PROTIME-INR
INR: 1.04 (ref 0.00–1.49)
Prothrombin Time: 13.8 seconds (ref 11.6–15.2)

## 2015-01-26 NOTE — Progress Notes (Signed)
U/A and micro results faxed via EPIC to Dr Barry Dienes.

## 2015-01-26 NOTE — Progress Notes (Signed)
ECHO- 2014-EPIC  CT Chest- 01/11/15- EPIC

## 2015-02-01 ENCOUNTER — Encounter (HOSPITAL_COMMUNITY): Payer: Self-pay | Admitting: General Surgery

## 2015-02-01 NOTE — H&P (Signed)
Shelley Sanchez 01/22/2015 8:20 AM Location: [facility] Patient #: [id number] DOB: Mar 23, 1956 Single / Language: Cleophus Molt / Race: White Female   History of Present Illness Stark Klein MD; 01/22/2015 9:21 AM) Patient words: splenic mass.  The patient is a 59 year old female who presents with a splenic disorder. Patient is a 59 year old female referred for consultation by Dr. Marko Plume for a new splenic mass. She was first diagnosed with ovarian cancer in December 2013 after a 2-3 months history of weight loss, incontinence, and abdominal bloating. She also developed shortness of breath. She had a thoracentesis and paracentesis. Her CA-125 was 20/4/18. She underwent a TAH/BSO debulking and omentectomy in January 2014. There was very little residual disease at that point. 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 She had Taxol carboplatin from February 2014 until June 2014. Her CA-125 came down to normal and stable low until June 2016. However, on her follow-up in October 2016, [place] heard CA-125 was 88. CT demonstrated a splenic mass with no significant disease in the chest, no ascites, no pleural effusions.  She was doing well in her usual state of good health until the first week of October. She was on vacation and found that her energy level was much lower than it had been. She also started having some left shoulder pain and left flank pain. She was having trouble taking a deep breath. This has now improved.  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   CT chest/abd/pelvis 01/11/2015  IMPRESSION: 1. Large left anterior splenic mass, most consistent with metastatic disease. A metachronous primary such as lymphoma (given absence of recurrent or metastatic disease elsewhere) is possible but considered less likely. Consider tissue sampling. 2. No acute process or evidence of metastatic disease in the chest.   Other Problems Stark Klein, MD; 01/22/2015 8:31 AM) Melanoma Oophorectomy Bilateral. Ovarian Cancer  Past Surgical History Stark Klein, MD; 01/22/2015 8:31 AM) Hysterectomy (due to cancer) - Complete  Diagnostic Studies History Stark Klein, MD; 01/22/2015 8:31 AM) Colonoscopy never Mammogram within last year Pap Smear 1-5 years ago  Allergies Jeralyn Ruths, [place]; 01/22/2015 8:55 AM) No Known Drug Allergies10/28/2016  Medication History Jeralyn Ruths, CMA; 01/22/2015 8:55 AM) Ibuprofen (200MG  Capsule, Oral prn) Active. Medications Reconciled  Social History Stark Klein, MD; 01/22/2015 8:31 AM) Alcohol use Occasional alcohol use. Caffeine use Coffee, Tea. No drug use Tobacco use Never smoker.  Family History Stark Klein, MD; 01/22/2015 8:31 AM) Arthritis Mother. Melanoma Mother.  Pregnancy / Birth History Stark Klein, MD; 01/22/2015 8:31 AM) Age at menarche 41 years. Age of menopause 47-55 Gravida 3 Maternal age 64-25 Para 3    Review of Systems Stark Klein MD; 01/22/2015 8:31 AM) General Present- Fatigue, Night Sweats and Weight Gain. Not Present- Appetite Loss, Chills, Fever and Weight Loss. Neurological Present- Headaches. Not Present- Decreased Memory, Fainting, Numbness, Seizures, Tingling, Tremor, Trouble walking and Weakness. Endocrine Present- Hair Changes. Not Present- Cold Intolerance, Excessive Hunger, Heat Intolerance, Hot flashes and New Diabetes.  Vitals Jearld Fenton Morris CMA; 01/22/2015 8:55 AM) 01/22/2015 8:55 AM Weight: 166.4 lb Height: 67.5in Body Surface Area: 1.88 m Body Mass Index: 25.68 kg/m  Temp.: 97.44F(Oral)  Pulse: 72 (Regular)  BP: 120/88 (Sitting, Left Arm, Standard)       Physical Exam Stark Klein MD; 01/22/2015 9:21 AM) General Mental Status-Alert. General Appearance-Consistent with stated age. Hydration-Well hydrated. Voice-Normal.  Head and Neck Head-normocephalic, atraumatic with no  lesions or palpable masses. Trachea-midline. Thyroid  Gland Characteristics - normal size and consistency.  Eye Eyeball - Bilateral-Extraocular movements intact. Sclera/Conjunctiva - Bilateral-No scleral icterus.  Chest and Lung Exam Chest and lung exam reveals -quiet, even and easy respiratory effort with no use of accessory muscles and on auscultation, normal breath sounds, no adventitious sounds and normal vocal resonance. Inspection Chest Wall - Normal. Back - normal.  Cardiovascular Cardiovascular examination reveals -normal heart sounds, regular rate and rhythm with no murmurs and normal pedal pulses bilaterally.  Abdomen Inspection Inspection of the abdomen reveals - No Hernias. Palpation/Percussion Palpation and Percussion of the abdomen reveal - Soft, Non Tender, No Rebound tenderness, No Rigidity (guarding) and No hepatosplenomegaly. Auscultation Auscultation of the abdomen reveals - Bowel sounds normal.  Neurologic Neurologic evaluation reveals -alert and oriented x 3 with no impairment of recent or remote memory. Mental Status-Normal.  Musculoskeletal Global Assessment -Note: no gross deformities.  Normal Exam - Left-Upper Extremity Strength Normal and Lower Extremity Strength Normal. Normal Exam - Right-Upper Extremity Strength Normal and Lower Extremity Strength Normal.  Lymphatic Head & Neck  General Head & Neck Lymphatics: Bilateral - Description - Normal. Axillary  General Axillary Region: Bilateral - Description - Normal. Tenderness - Non Tender. Femoral & Inguinal  Generalized Femoral & Inguinal Lymphatics: Bilateral - Description - No Generalized lymphadenopathy.    Assessment & Plan Stark Klein MD; 01/22/2015 9:27 AM) SPLENIC MASS (R16.1) Impression: The patient has oligo metastatic disease. The splenic mass is presumably ovarian cancer given her increased CEA 125. We're not able to biopsy this because of potential for  rupture.  I'll plan to do a hand-assisted laparoscopic splenectomy. I reviewed the surgery with the patient. I discussed diagrams of anatomy and reviewed the risks. I discussed the risks of bleeding, infection, damage to adjacent structures, and pancreatic leak, heart or lung complications, blood clots, possible need for additional procedures or surgery, possible wound complications, and possible death.  I discussed postoperative restrictions and recovery. 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 I discussed that she may possibly be able to do this within 2 weeks, but that she may still be too sore and that may be more like 4-6 weeks.  45 min spent in evaluation, examination, counseling, and coordination of care. >50% spent in counseling.  The patient understands and wishes to proceed. We will do this at the first available opportunity. Current Plans You are being scheduled for surgery - Our schedulers will call you.  You should hear from our office's scheduling department within 5 working days about the location, date, and time of surgery. We try to make accommodations for patient's preferences in scheduling surgery, but sometimes the OR schedule or the surgeon's schedule prevents [place] from making those accommodations.  If you have not heard from our office [phone]) in 5 working days, call the office and ask for your surgeon's nurse.  If you have other questions about your diagnosis, plan, or surgery, call the office and ask for your surgeon's nurse.  Pt Education - C[facility] Free Text Education/Instructions: discussed with patient and provided information.   Signed by Stark Klein, MD (01/22/2015 9:28 AM)

## 2015-02-02 ENCOUNTER — Inpatient Hospital Stay (HOSPITAL_COMMUNITY): Payer: BLUE CROSS/BLUE SHIELD | Admitting: Anesthesiology

## 2015-02-02 ENCOUNTER — Encounter (HOSPITAL_COMMUNITY)
Admission: RE | Disposition: A | Discharge status: Home / Self Care | Payer: Self-pay | Source: Ambulatory Visit | Attending: General Surgery

## 2015-02-02 ENCOUNTER — Encounter (HOSPITAL_COMMUNITY): Payer: Self-pay | Admitting: *Deleted

## 2015-02-02 ENCOUNTER — Inpatient Hospital Stay (HOSPITAL_COMMUNITY)
Admission: RE | Admit: 2015-02-02 | Discharge: 2015-02-08 | DRG: 357 | Disposition: A | Discharge status: Home / Self Care | Payer: BLUE CROSS/BLUE SHIELD | Source: Ambulatory Visit | Attending: General Surgery | Admitting: General Surgery

## 2015-02-02 DIAGNOSIS — Z01812 Encounter for preprocedural laboratory examination: Secondary | ICD-10-CM

## 2015-02-02 DIAGNOSIS — C7889 Secondary malignant neoplasm of other digestive organs: Secondary | ICD-10-CM | POA: Diagnosis present

## 2015-02-02 DIAGNOSIS — Z808 Family history of malignant neoplasm of other organs or systems: Secondary | ICD-10-CM | POA: Diagnosis not present

## 2015-02-02 DIAGNOSIS — R51 Headache: Secondary | ICD-10-CM | POA: Diagnosis present

## 2015-02-02 DIAGNOSIS — C561 Malignant neoplasm of right ovary: Secondary | ICD-10-CM | POA: Diagnosis present

## 2015-02-02 DIAGNOSIS — M25512 Pain in left shoulder: Secondary | ICD-10-CM | POA: Diagnosis present

## 2015-02-02 DIAGNOSIS — K567 Ileus, unspecified: Secondary | ICD-10-CM | POA: Diagnosis not present

## 2015-02-02 DIAGNOSIS — R161 Splenomegaly, not elsewhere classified: Secondary | ICD-10-CM | POA: Diagnosis present

## 2015-02-02 SURGERY — CHOLECYSTECTOMY, ROBOT-ASSISTED, LAPAROSCOPIC
Anesthesia: General | Site: Abdomen

## 2015-02-02 MED ORDER — BUPIVACAINE ON-Q PAIN PUMP (FOR ORDER SET NO CHG)
INJECTION | Status: AC
  Filled 2015-02-02: qty 1

## 2015-02-02 MED ORDER — HYDROMORPHONE HCL 2 MG/ML IJ SOLN
INTRAMUSCULAR | Status: AC
  Filled 2015-02-02: qty 1

## 2015-02-02 MED ORDER — HYDRALAZINE HCL 20 MG/ML IJ SOLN: 10.0000 mg | INTRAMUSCULAR | Status: DC | PRN

## 2015-02-02 MED ORDER — HYDROMORPHONE HCL 1 MG/ML IJ SOLN: 0.5000 mg | INTRAMUSCULAR | Status: DC | PRN

## 2015-02-02 MED ORDER — ROCURONIUM BROMIDE 100 MG/10ML IV SOLN
INTRAVENOUS | Status: AC
  Filled 2015-02-02: qty 1

## 2015-02-02 MED ORDER — METHOCARBAMOL 500 MG PO TABS
500.0000 mg | ORAL_TABLET | Freq: Four times a day (QID) | ORAL | Status: DC | PRN
  Administered 2015-02-04: 500 mg via ORAL
  Filled 2015-02-02: qty 1

## 2015-02-02 MED ORDER — NALOXONE HCL 0.4 MG/ML IJ SOLN: 0.4000 mg | INTRAMUSCULAR | Status: DC | PRN

## 2015-02-02 MED ORDER — KETOROLAC TROMETHAMINE 30 MG/ML IJ SOLN
30.0000 mg | Freq: Four times a day (QID) | INTRAMUSCULAR | Status: AC
  Administered 2015-02-02 – 2015-02-07 (×15): 30 mg via INTRAVENOUS
  Filled 2015-02-02 (×20): qty 1

## 2015-02-02 MED ORDER — ZOLPIDEM TARTRATE 5 MG PO TABS: 5.0000 mg | ORAL_TABLET | Freq: Every evening | ORAL | Status: DC | PRN

## 2015-02-02 MED ORDER — PHENYLEPHRINE 40 MCG/ML (10ML) SYRINGE FOR IV PUSH (FOR BLOOD PRESSURE SUPPORT)
PREFILLED_SYRINGE | INTRAVENOUS | Status: AC
  Filled 2015-02-02: qty 10

## 2015-02-02 MED ORDER — KCL IN DEXTROSE-NACL 20-5-0.45 MEQ/L-%-% IV SOLN
INTRAVENOUS | Status: DC
  Administered 2015-02-02: 19:00:00 via INTRAVENOUS
  Administered 2015-02-03: 100 mL/h via INTRAVENOUS
  Administered 2015-02-04 – 2015-02-07 (×4): via INTRAVENOUS
  Filled 2015-02-02 (×11): qty 1000

## 2015-02-02 MED ORDER — DEXAMETHASONE SODIUM PHOSPHATE 10 MG/ML IJ SOLN
INTRAMUSCULAR | Status: DC | PRN
  Administered 2015-02-02: 10 mg via INTRAVENOUS

## 2015-02-02 MED ORDER — HYDROMORPHONE 1 MG/ML IV SOLN
INTRAVENOUS | Status: DC
  Administered 2015-02-02: 0.3 mg via INTRAVENOUS
  Administered 2015-02-02: 0.9 mg via INTRAVENOUS
  Administered 2015-02-02: 15:00:00 via INTRAVENOUS
  Administered 2015-02-03: 0.3 mg via INTRAVENOUS
  Administered 2015-02-03 (×5): 0.6 mg via INTRAVENOUS
  Administered 2015-02-04: 0.3 mg via INTRAVENOUS
  Administered 2015-02-04: 0.6 mg via INTRAVENOUS
  Administered 2015-02-04: 1.8 mg via INTRAVENOUS
  Administered 2015-02-04: 1.5 mg via INTRAVENOUS

## 2015-02-02 MED ORDER — GLYCOPYRROLATE 0.2 MG/ML IJ SOLN
INTRAMUSCULAR | Status: DC | PRN
  Administered 2015-02-02: 0.6 mg via INTRAVENOUS

## 2015-02-02 MED ORDER — HYDROMORPHONE HCL 1 MG/ML IJ SOLN
0.2500 mg | INTRAMUSCULAR | Status: DC | PRN
  Administered 2015-02-02 (×2): 0.5 mg via INTRAVENOUS

## 2015-02-02 MED ORDER — HYDROMORPHONE HCL 1 MG/ML IJ SOLN
INTRAMUSCULAR | Status: AC
  Filled 2015-02-02: qty 1

## 2015-02-02 MED ORDER — LACTATED RINGERS IR SOLN
Status: DC | PRN
  Administered 2015-02-02: 1000 mL

## 2015-02-02 MED ORDER — SUCCINYLCHOLINE CHLORIDE 20 MG/ML IJ SOLN
INTRAMUSCULAR | Status: DC | PRN
  Administered 2015-02-02: 100 mg via INTRAVENOUS

## 2015-02-02 MED ORDER — GLYCOPYRROLATE 0.2 MG/ML IJ SOLN
INTRAMUSCULAR | Status: AC
  Filled 2015-02-02: qty 3

## 2015-02-02 MED ORDER — LACTATED RINGERS IV SOLN
INTRAVENOUS | Status: DC
  Administered 2015-02-02: 11:00:00 via INTRAVENOUS

## 2015-02-02 MED ORDER — HYDROMORPHONE HCL 1 MG/ML IJ SOLN
INTRAMUSCULAR | Status: DC | PRN
  Administered 2015-02-02 (×2): .4 mg via INTRAVENOUS

## 2015-02-02 MED ORDER — NEOSTIGMINE METHYLSULFATE 10 MG/10ML IV SOLN
INTRAVENOUS | Status: DC | PRN
  Administered 2015-02-02: 4 mg via INTRAVENOUS

## 2015-02-02 MED ORDER — ROCURONIUM BROMIDE 100 MG/10ML IV SOLN
INTRAVENOUS | Status: DC | PRN
  Administered 2015-02-02 (×2): 20 mg via INTRAVENOUS
  Administered 2015-02-02: 30 mg via INTRAVENOUS
  Administered 2015-02-02: 20 mg via INTRAVENOUS
  Administered 2015-02-02: 10 mg via INTRAVENOUS

## 2015-02-02 MED ORDER — IBUPROFEN 400 MG PO TABS
400.0000 mg | ORAL_TABLET | Freq: Four times a day (QID) | ORAL | Status: DC | PRN
Start: 2015-02-07 — End: 2015-02-08
  Filled 2015-02-02: qty 1

## 2015-02-02 MED ORDER — DOCUSATE SODIUM 100 MG PO CAPS
100.0000 mg | ORAL_CAPSULE | Freq: Two times a day (BID) | ORAL | Status: DC
  Administered 2015-02-02 – 2015-02-07 (×11): 100 mg via ORAL
  Filled 2015-02-02 (×14): qty 1

## 2015-02-02 MED ORDER — FENTANYL CITRATE (PF) 250 MCG/5ML IJ SOLN
INTRAMUSCULAR | Status: AC
  Filled 2015-02-02: qty 25

## 2015-02-02 MED ORDER — ONDANSETRON HCL 4 MG/2ML IJ SOLN
INTRAMUSCULAR | Status: AC
  Filled 2015-02-02: qty 2

## 2015-02-02 MED ORDER — SIMETHICONE 80 MG PO CHEW
40.0000 mg | CHEWABLE_TABLET | Freq: Four times a day (QID) | ORAL | Status: DC | PRN
  Filled 2015-02-02: qty 1

## 2015-02-02 MED ORDER — PROPOFOL 10 MG/ML IV BOLUS
INTRAVENOUS | Status: AC
  Filled 2015-02-02: qty 20

## 2015-02-02 MED ORDER — 0.9 % SODIUM CHLORIDE (POUR BTL) OPTIME
TOPICAL | Status: DC | PRN
  Administered 2015-02-02: 1000 mL

## 2015-02-02 MED ORDER — SODIUM CHLORIDE 0.9 % IJ SOLN
INTRAMUSCULAR | Status: AC
  Filled 2015-02-02: qty 10

## 2015-02-02 MED ORDER — LACTATED RINGERS IV SOLN
INTRAVENOUS | Status: DC
  Administered 2015-02-02 (×2): via INTRAVENOUS

## 2015-02-02 MED ORDER — MIDAZOLAM HCL 5 MG/5ML IJ SOLN
INTRAMUSCULAR | Status: DC | PRN
  Administered 2015-02-02 (×2): 1 mg via INTRAVENOUS

## 2015-02-02 MED ORDER — DIPHENHYDRAMINE HCL 12.5 MG/5ML PO ELIX: 12.5000 mg | ORAL_SOLUTION | Freq: Four times a day (QID) | ORAL | Status: DC | PRN

## 2015-02-02 MED ORDER — ONDANSETRON 4 MG PO TBDP: 4.0000 mg | ORAL_TABLET | Freq: Four times a day (QID) | ORAL | Status: DC | PRN

## 2015-02-02 MED ORDER — DIPHENHYDRAMINE HCL 50 MG/ML IJ SOLN: 12.5000 mg | Freq: Four times a day (QID) | INTRAMUSCULAR | Status: DC | PRN

## 2015-02-02 MED ORDER — HYDROMORPHONE 1 MG/ML IV SOLN
INTRAVENOUS | Status: AC
  Filled 2015-02-02: qty 25

## 2015-02-02 MED ORDER — PHENYLEPHRINE HCL 10 MG/ML IJ SOLN
INTRAMUSCULAR | Status: DC | PRN
  Administered 2015-02-02 (×2): 40 ug via INTRAVENOUS
  Administered 2015-02-02: 80 ug via INTRAVENOUS
  Administered 2015-02-02: 40 ug via INTRAVENOUS
  Administered 2015-02-02 (×2): 80 ug via INTRAVENOUS

## 2015-02-02 MED ORDER — LIDOCAINE HCL (CARDIAC) 20 MG/ML IV SOLN
INTRAVENOUS | Status: AC
  Filled 2015-02-02: qty 5

## 2015-02-02 MED ORDER — SODIUM CHLORIDE 0.9 % IJ SOLN: 9.0000 mL | INTRAMUSCULAR | Status: DC | PRN

## 2015-02-02 MED ORDER — LACTATED RINGERS IV SOLN: INTRAVENOUS | Status: DC

## 2015-02-02 MED ORDER — ONDANSETRON HCL 4 MG/2ML IJ SOLN: 4.0000 mg | Freq: Four times a day (QID) | INTRAMUSCULAR | Status: DC | PRN

## 2015-02-02 MED ORDER — LIDOCAINE HCL 1 % IJ SOLN
INTRAMUSCULAR | Status: AC
  Filled 2015-02-02: qty 20

## 2015-02-02 MED ORDER — ONDANSETRON HCL 4 MG/2ML IJ SOLN
INTRAMUSCULAR | Status: DC | PRN
  Administered 2015-02-02: 4 mg via INTRAVENOUS

## 2015-02-02 MED ORDER — CETYLPYRIDINIUM CHLORIDE 0.05 % MT LIQD
7.0000 mL | Freq: Two times a day (BID) | OROMUCOSAL | Status: DC
  Administered 2015-02-02 – 2015-02-04 (×5): 7 mL via OROMUCOSAL

## 2015-02-02 MED ORDER — AMPICILLIN-SULBACTAM SODIUM 3 (2-1) G IJ SOLR
3.0000 g | Freq: Four times a day (QID) | INTRAMUSCULAR | Status: AC
  Administered 2015-02-02: 3 g via INTRAVENOUS
  Filled 2015-02-02: qty 3

## 2015-02-02 MED ORDER — OXYCODONE HCL 5 MG PO TABS
5.0000 mg | ORAL_TABLET | ORAL | Status: DC | PRN
  Administered 2015-02-04 – 2015-02-07 (×2): 5 mg via ORAL
  Filled 2015-02-02 (×2): qty 1

## 2015-02-02 MED ORDER — LIDOCAINE HCL (CARDIAC) 20 MG/ML IV SOLN
INTRAVENOUS | Status: DC | PRN
  Administered 2015-02-02: 80 mg via INTRAVENOUS

## 2015-02-02 MED ORDER — ACETAMINOPHEN 10 MG/ML IV SOLN
1000.0000 mg | Freq: Once | INTRAVENOUS | Status: AC
  Administered 2015-02-02: 1000 mg via INTRAVENOUS

## 2015-02-02 MED ORDER — BISACODYL 10 MG RE SUPP: 10.0000 mg | Freq: Every day | RECTAL | Status: DC | PRN

## 2015-02-02 MED ORDER — CEFAZOLIN SODIUM-DEXTROSE 2-3 GM-% IV SOLR
2.0000 g | INTRAVENOUS | Status: AC
  Administered 2015-02-02: 2 g via INTRAVENOUS

## 2015-02-02 MED ORDER — LIDOCAINE HCL (PF) 1 % IJ SOLN
INTRAMUSCULAR | Status: DC | PRN
  Administered 2015-02-02: 20 mL

## 2015-02-02 MED ORDER — PROPOFOL 10 MG/ML IV BOLUS
INTRAVENOUS | Status: DC | PRN
  Administered 2015-02-02: 150 mg via INTRAVENOUS

## 2015-02-02 MED ORDER — BUPIVACAINE-EPINEPHRINE (PF) 0.25% -1:200000 IJ SOLN
INTRAMUSCULAR | Status: AC
  Filled 2015-02-02: qty 30

## 2015-02-02 MED ORDER — ACETAMINOPHEN 500 MG PO TABS
1000.0000 mg | ORAL_TABLET | Freq: Four times a day (QID) | ORAL | Status: DC | PRN
  Administered 2015-02-07: 1000 mg via ORAL
  Filled 2015-02-02: qty 2

## 2015-02-02 MED ORDER — DEXAMETHASONE SODIUM PHOSPHATE 10 MG/ML IJ SOLN
INTRAMUSCULAR | Status: AC
  Filled 2015-02-02: qty 1

## 2015-02-02 MED ORDER — FENTANYL CITRATE (PF) 250 MCG/5ML IJ SOLN
INTRAMUSCULAR | Status: DC | PRN
  Administered 2015-02-02 (×3): 50 ug via INTRAVENOUS
  Administered 2015-02-02: 100 ug via INTRAVENOUS

## 2015-02-02 MED ORDER — EPHEDRINE SULFATE 50 MG/ML IJ SOLN
INTRAMUSCULAR | Status: AC
  Filled 2015-02-02: qty 1

## 2015-02-02 MED ORDER — BUPIVACAINE 0.25 % ON-Q PUMP DUAL CATH 300 ML
300.0000 mL | INJECTION | Status: DC
  Administered 2015-02-02: 300 mL
  Filled 2015-02-02: qty 300

## 2015-02-02 MED ORDER — ACETAMINOPHEN 10 MG/ML IV SOLN
INTRAVENOUS | Status: AC
  Filled 2015-02-02: qty 100

## 2015-02-02 MED ORDER — PANTOPRAZOLE SODIUM 40 MG IV SOLR
40.0000 mg | Freq: Every day | INTRAVENOUS | Status: DC
  Administered 2015-02-02 – 2015-02-07 (×6): 40 mg via INTRAVENOUS
  Filled 2015-02-02 (×7): qty 40

## 2015-02-02 MED ORDER — MIDAZOLAM HCL 2 MG/2ML IJ SOLN
INTRAMUSCULAR | Status: AC
  Filled 2015-02-02: qty 4

## 2015-02-02 MED ORDER — EPHEDRINE SULFATE 50 MG/ML IJ SOLN
INTRAMUSCULAR | Status: DC | PRN
  Administered 2015-02-02 (×2): 10 mg via INTRAVENOUS

## 2015-02-02 MED ORDER — BUPIVACAINE-EPINEPHRINE 0.25% -1:200000 IJ SOLN
INTRAMUSCULAR | Status: DC | PRN
  Administered 2015-02-02: 20 mL

## 2015-02-02 MED ORDER — CEFAZOLIN SODIUM-DEXTROSE 2-3 GM-% IV SOLR
INTRAVENOUS | Status: AC
  Filled 2015-02-02: qty 50

## 2015-02-02 SURGICAL SUPPLY — 88 items
APPLIER CLIP 5 13 M/L LIGAMAX5 (MISCELLANEOUS)
APPLIER CLIP ROT 10 11.4 M/L (STAPLE)
BLADE EXTENDED COATED 6.5IN (ELECTRODE) IMPLANT
BLADE HEX COATED 2.75 (ELECTRODE) IMPLANT
BLADE SURG 15 STRL LF DISP TIS (BLADE) ×1 IMPLANT
BLADE SURG 15 STRL SS (BLADE) ×1
BLADE SURG SZ10 CARB STEEL (BLADE) IMPLANT
CABLE HIGH FREQUENCY MONO STRZ (ELECTRODE) IMPLANT
CANNULA REDUC XI 12-8 STAPL (CANNULA) ×1
CANNULA REDUCER 12-8 DVNC XI (CANNULA) ×1 IMPLANT
CATH KIT ON-Q SILVERSOAK 5IN (CATHETERS) ×4 IMPLANT
CHLORAPREP W/TINT 26ML (MISCELLANEOUS) ×2 IMPLANT
CLIP APPLIE 5 13 M/L LIGAMAX5 (MISCELLANEOUS) IMPLANT
CLIP APPLIE ROT 10 11.4 M/L (STAPLE) IMPLANT
CLIP LIGATING HEM O LOK PURPLE (MISCELLANEOUS) IMPLANT
CLIP LIGATING HEMO O LOK GREEN (MISCELLANEOUS) IMPLANT
CLIP LIGATING HEMOLOK MED (MISCELLANEOUS) IMPLANT
COVER MAYO STAND STRL (DRAPES) IMPLANT
COVER SURGICAL LIGHT HANDLE (MISCELLANEOUS) IMPLANT
COVER TIP SHEARS 8 DVNC (MISCELLANEOUS) ×1 IMPLANT
COVER TIP SHEARS 8MM DA VINCI (MISCELLANEOUS) ×1
DECANTER SPIKE VIAL GLASS SM (MISCELLANEOUS) IMPLANT
DEVICE TROCAR PUNCTURE CLOSURE (ENDOMECHANICALS) IMPLANT
DRAIN CHANNEL 19F RND (DRAIN) ×2 IMPLANT
DRAPE ARM DVNC X/XI (DISPOSABLE) ×4 IMPLANT
DRAPE COLUMN DVNC XI (DISPOSABLE) ×1 IMPLANT
DRAPE DA VINCI XI ARM (DISPOSABLE) ×4
DRAPE DA VINCI XI COLUMN (DISPOSABLE) ×1
DRAPE SHEET LG 3/4 BI-LAMINATE (DRAPES) ×4 IMPLANT
DRAPE SURG IRRIG POUCH 19X23 (DRAPES) ×2 IMPLANT
DRAPE WARM FLUID 44X44 (DRAPE) ×2 IMPLANT
DRESSING TELFA ISLAND 4X8 (GAUZE/BANDAGES/DRESSINGS) ×2 IMPLANT
DRSG OPSITE POSTOP 4X10 (GAUZE/BANDAGES/DRESSINGS) IMPLANT
DRSG OPSITE POSTOP 4X6 (GAUZE/BANDAGES/DRESSINGS) IMPLANT
DRSG OPSITE POSTOP 4X8 (GAUZE/BANDAGES/DRESSINGS) IMPLANT
DRSG TEGADERM 2-3/8X2-3/4 SM (GAUZE/BANDAGES/DRESSINGS) ×2 IMPLANT
DRSG TEGADERM 4X4.75 (GAUZE/BANDAGES/DRESSINGS) ×2 IMPLANT
DRSG TEGADERM 6X8 (GAUZE/BANDAGES/DRESSINGS) IMPLANT
ELECT PENCIL ROCKER SW 15FT (MISCELLANEOUS) ×2 IMPLANT
ELECT REM PT RETURN 9FT ADLT (ELECTROSURGICAL) ×2
ELECTRODE REM PT RTRN 9FT ADLT (ELECTROSURGICAL) ×1 IMPLANT
EVACUATOR SILICONE 100CC (DRAIN) ×2 IMPLANT
GAUZE SPONGE 4X4 12PLY STRL (GAUZE/BANDAGES/DRESSINGS) IMPLANT
GLOVE BIO SURGEON STRL SZ 6 (GLOVE) ×6 IMPLANT
GLOVE INDICATOR 6.5 STRL GRN (GLOVE) ×6 IMPLANT
GOWN STRL REUS W/TWL 2XL LVL3 (GOWN DISPOSABLE) ×4 IMPLANT
GOWN STRL REUS W/TWL XL LVL3 (GOWN DISPOSABLE) ×10 IMPLANT
KIT BASIN OR (CUSTOM PROCEDURE TRAY) ×6 IMPLANT
NEEDLE HYPO 22GX1.5 SAFETY (NEEDLE) ×2 IMPLANT
NEEDLE INSUFFLATION 14GA 120MM (NEEDLE) ×2 IMPLANT
PACK CARDIOVASCULAR III (CUSTOM PROCEDURE TRAY) ×2 IMPLANT
PAD POSITIONING PINK XL (MISCELLANEOUS) IMPLANT
RELOAD STAPLER WHITE 60MM (STAPLE) ×2 IMPLANT
SCISSORS LAP 5X35 DISP (ENDOMECHANICALS) ×2 IMPLANT
SEAL CANN UNIV 5-8 DVNC XI (MISCELLANEOUS) ×3 IMPLANT
SEAL XI 5MM-8MM UNIVERSAL (MISCELLANEOUS) ×3
SEALER VESSEL DA VINCI XI (MISCELLANEOUS) ×1
SEALER VESSEL EXT DVNC XI (MISCELLANEOUS) ×1 IMPLANT
SET IRRIG TUBING LAPAROSCOPIC (IRRIGATION / IRRIGATOR) ×2 IMPLANT
SHEARS HARMONIC ACE PLUS 36CM (ENDOMECHANICALS) ×2 IMPLANT
SHEET LAVH (DRAPES) IMPLANT
SLEEVE SURGEON STRL (DRAPES) ×2 IMPLANT
SLEEVE XCEL OPT CAN 5 100 (ENDOMECHANICALS) IMPLANT
SOLUTION ELECTROLUBE (MISCELLANEOUS) ×2 IMPLANT
SPONGE DRAIN TRACH 4X4 STRL 2S (GAUZE/BANDAGES/DRESSINGS) ×2 IMPLANT
SPONGE LAP 18X18 X RAY DECT (DISPOSABLE) ×6 IMPLANT
STAPLE ECHEON FLEX 60 POW ENDO (STAPLE) ×2 IMPLANT
STAPLER RELOAD WHITE 60MM (STAPLE) ×4
STAPLER SHEATH (SHEATH) ×1
STAPLER SHEATH ENDOWRIST DVNC (SHEATH) ×1 IMPLANT
SUCTION POOLE TIP (SUCTIONS) ×2 IMPLANT
SUT ETHILON 2 0 PS N (SUTURE) ×2 IMPLANT
SUT MNCRL AB 4-0 PS2 18 (SUTURE) ×2 IMPLANT
SUT PDS AB 1 CTX 36 (SUTURE) ×4 IMPLANT
SUT VICRYL 0 TIES 12 18 (SUTURE) ×2 IMPLANT
SUT VICRYL 0 UR6 27IN ABS (SUTURE) ×2 IMPLANT
SYR BULB IRRIGATION 50ML (SYRINGE) ×2 IMPLANT
SYRINGE 10CC LL (SYRINGE) ×2 IMPLANT
SYS LAPSCP GELPORT 120MM (MISCELLANEOUS) ×2
SYSTEM LAPSCP GELPORT 120MM (MISCELLANEOUS) ×1 IMPLANT
TOWEL OR 17X26 10 PK STRL BLUE (TOWEL DISPOSABLE) ×2 IMPLANT
TOWEL OR NON WOVEN STRL DISP B (DISPOSABLE) ×2 IMPLANT
TRAY FOLEY W/METER SILVER 14FR (SET/KITS/TRAYS/PACK) ×2 IMPLANT
TRAY FOLEY W/METER SILVER 16FR (SET/KITS/TRAYS/PACK) IMPLANT
TROCAR BLADELESS OPT 5 100 (ENDOMECHANICALS) ×2 IMPLANT
TUBING FILTER THERMOFLATOR (ELECTROSURGICAL) ×2 IMPLANT
TUNNELER SHEATH ON-Q 16GX12 DP (PAIN MANAGEMENT) ×2 IMPLANT
YANKAUER SUCT BULB TIP NO VENT (SUCTIONS) ×2 IMPLANT

## 2015-02-02 NOTE — Anesthesia Preprocedure Evaluation (Addendum)
Anesthesia Evaluation  Patient identified by MRN, date of birth, ID band Patient awake    Reviewed: Allergy & Precautions, H&P , NPO status , Patient's Chart, lab work & pertinent test results  Airway Mallampati: II  TM Distance: >3 FB Neck ROM: Full    Dental no notable dental hx. (+) Dental Advisory Given, Teeth Intact   Pulmonary shortness of breath and with exertion,  bil pleural effusions. 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  Renal/GU negative Renal ROS  negative genitourinary   Musculoskeletal negative musculoskeletal ROS (+)   Abdominal   Peds negative pediatric ROS (+)  Hematology negative hematology ROS (+)   Anesthesia Other Findings   Reproductive/Obstetrics negative OB ROS                            Anesthesia Physical Anesthesia Plan  ASA: III  Anesthesia Plan: General   Post-op Pain Management:    Induction: Intravenous, Rapid sequence and Cricoid pressure planned  Airway Management Planned: Oral ETT  Additional Equipment:   Intra-op Plan:   Post-operative Plan: Extubation in OR  Informed Consent: I have reviewed the patients History and Physical, chart, labs and discussed the procedure including the risks, benefits and alternatives for the proposed anesthesia with the patient or authorized representative who has indicated his/her understanding and acceptance.   Dental Advisory Given  Plan Discussed with: CRNA and Surgeon  Anesthesia Plan Comments:         Anesthesia Quick  Evaluation

## 2015-02-02 NOTE — Anesthesia Procedure Notes (Signed)
Procedure Name: Intubation Date/Time: 02/02/2015 12:16 PM Performed by: Dione Booze Pre-anesthesia Checklist: Patient identified, Emergency Drugs available, Suction available and Patient being monitored Patient Re-evaluated:Patient Re-evaluated prior to inductionOxygen Delivery Method: Circle system utilized Preoxygenation: Pre-oxygenation with 100% oxygen Intubation Type: IV induction, Rapid sequence and Cricoid Pressure applied Laryngoscope Size: Mac and 4 Grade View: Grade II Tube type: Oral Tube size: 7.5 mm Number of attempts: 1 Airway Equipment and Method: Stylet Placement Confirmation: ETT inserted through vocal cords under direct vision,  breath sounds checked- equal and bilateral and positive ETCO2 Secured at: 21 cm Tube secured with: Tape Dental Injury: Teeth and Oropharynx as per pre-operative assessment

## 2015-02-02 NOTE — Anesthesia Postprocedure Evaluation (Signed)
  Anesthesia Post-op Note  Patient: Shelley Sanchez  Procedure(s) Performed: Procedure(s) (LRB): XI ROBOTIC ASSISTED LAPAROSCOPIC SPLENECTOMY (N/A)  Patient Location: PACU  Anesthesia Type: General  Level of Consciousness: awake and alert   Airway and Oxygen Therapy: Patient Spontanous Breathing  Post-op Pain: mild  Post-op Assessment: Post-op Vital signs reviewed, Patient's Cardiovascular Status Stable, Respiratory Function Stable, Patent Airway and No signs of Nausea or vomiting  Last Vitals:  Filed Vitals:   02/02/15 1601  BP: 110/62  Pulse: 64  Temp: 36.3 C  Resp: 15    Post-op Vital Signs: stable   Complications: No apparent anesthesia complications

## 2015-02-02 NOTE — Op Note (Signed)
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  FINDINGS:  Large spleen, medial metastasis near hilum.    EBL: min  PROCEDURE:   Patient was identified in the holding area and taken to the operating room where she was placed supine on operating room table. General anesthesia was induced and a Foley catheter was placed. She was then placed into the leading spleen position.  The abdomen was prepped and draped in sterile fashion.  A timeout was performed according to the surgical safety checklist. When all was correct, we continued.  A Veress needle was inserted into the abdomen in the left upper quadrant. The abdomen was insufflated to a pressure of 15 mmHg. A 63mm robotic port was placed in the left lower quadrant. The patient's abdomen was inspected with the camera. There was no evidence of carcinomatosis. The spleen was immediately visible. It appears larger than on her previous scan. The additional robotic ports were placed in the left upper quadrant and a 12 mm robotic port placed in the left lower quadrant. Several adhesions from her prior surgery were taken down sharply. The robot was undocked. The vessel sealer was used to take down the omental attachments to the spleen. The lienocolic ligament was taken down. The tissue was extremely friable. There is difficulty assessing what was oozing A decision was made to convert to a hand-assisted  approach.  A left upper quadrant paramedian incision was made converting the robotic ports together. The muscle was separated but not divided. The GelPort was placed into the abdomen. Hand assistance was used to help identify the areas of oozing. The short gastrics were able to be taken down with the harmonic scalpel. The posterior attachments of the spleen to the retroperitoneum were taken down. The splenic hilum was identified. The vascular load of the Echelon stapler was used to fire across the hilum of the spleen. The tail of the pancreas was adherent to the mass and was taken en bloc with the specimen.  The superior attachments were taken as well with the harmonic.  The spleen was then removed through the gel port.  The LUQ was irrgated.  A 19 Fr blake drain was placed along the pancreatic tail.    The 12 mm port was closed with a 0-0 vicryl suture.  The OnQ tunneler was placed in the preperitoneal space on both sides of the fascial incision.  The fascia was closed with #1 running PDS suture in 2 layers.  The skin was irrigated and closed with staples.  The OnQ catheters were advanced through the tunneler sheaths and the sheaths were removed.  The wounds were cleaned, dried, and dressed with soft sterile dressings.  Needle, sponge, and instrument counts were correct x 2.

## 2015-02-02 NOTE — Transfer of Care (Signed)
Immediate Anesthesia Transfer of Care Note  Patient: Shelley Sanchez  Procedure(s) Performed: Procedure(s): XI ROBOTIC ASSISTED LAPAROSCOPIC SPLENECTOMY (N/A)  Patient Location: PACU  Anesthesia Type:General  Level of Consciousness: awake, alert , oriented and patient cooperative  Airway & Oxygen Therapy: Patient Spontanous Breathing and Patient connected to face mask oxygen  Post-op Assessment: Report given to RN and Post -op Vital signs reviewed and stable  Post vital signs: Reviewed and stable  Last Vitals:  Filed Vitals:   02/02/15 0958  BP: 110/47  Pulse: 87  Temp: 36.4 C  Resp: 16    Complications: No apparent anesthesia complications

## 2015-02-02 NOTE — Interval H&P Note (Signed)
History and Physical Interval Note:  02/02/2015 12:07 PM  Shelley Sanchez  has presented today for surgery, with the diagnosis of MALIGNANT SPLENIC MASS  The various methods of treatment have been discussed with the patient and family. After consideration of risks, benefits and other options for treatment, the patient has consented to  Procedure(s): XI ROBOTIC [place] (N/A) as a surgical intervention .  The patient's history has been reviewed, patient examined, no change in status, stable for surgery.  I have reviewed the patient's chart and labs.  Questions were answered to the patient's satisfaction.     [name]

## 2015-02-03 ENCOUNTER — Encounter (HOSPITAL_COMMUNITY): Payer: BLUE CROSS/BLUE SHIELD

## 2015-02-03 LAB — CBC
HCT: 31.3 % — ABNORMAL LOW (ref 36.0–46.0)
Hemoglobin: 10.1 g/dL — ABNORMAL LOW (ref 12.0–15.0)
MCH: 30.6 pg (ref 26.0–34.0)
MCHC: 32.3 g/dL (ref 30.0–36.0)
MCV: 94.8 fL (ref 78.0–100.0)
PLATELETS: 189 10*3/uL (ref 150–400)
RBC: 3.3 MIL/uL — AB (ref 3.87–5.11)
RDW: 13.5 % (ref 11.5–15.5)
WBC: 11.4 10*3/uL — ABNORMAL HIGH (ref 4.0–10.5)

## 2015-02-03 LAB — BASIC METABOLIC PANEL
Anion gap: 6 (ref 5–15)
BUN: 14 mg/dL (ref 6–20)
CHLORIDE: 100 mmol/L — AB (ref 101–111)
CO2: 29 mmol/L (ref 22–32)
CREATININE: 0.84 mg/dL (ref 0.44–1.00)
Calcium: 8.6 mg/dL — ABNORMAL LOW (ref 8.9–10.3)
GFR calc Af Amer: >60 mL/min (ref >60)
GLUCOSE: 153 mg/dL — AB (ref 65–99)
POTASSIUM: 4.8 mmol/L (ref 3.5–5.1)
Sodium: 135 mmol/L (ref 135–145)

## 2015-02-03 MED ORDER — ASPIRIN-ACETAMINOPHEN-CAFFEINE 250-250-65 MG PO TABS
2.0000 | ORAL_TABLET | Freq: Three times a day (TID) | ORAL | Status: DC | PRN
  Administered 2015-02-03 – 2015-02-04 (×2): 2 via ORAL
  Filled 2015-02-03 (×4): qty 2

## 2015-02-03 NOTE — Progress Notes (Signed)
1 Day Post-Op  Subjective: No n/v.  Has a headache.    Objective: Vital signs in last 24 hours: Temp:  [97.4 F (36.3 C)-98.6 F (37 C)] 98.6 F (37 C) (11/09 1000) Pulse Rate:  [55-81] 78 (11/09 1000) Resp:  [10-18] 18 (11/09 1000) BP: (89-115)/(41-66) 102/59 mmHg (11/09 1000) SpO2:  [97 %-100 %] 100 % (11/09 0717)    Intake/Output from previous day: 11/08 0701 - 11/09 0700 In: 3288.3 [I.V.:3288.3] Out: 1540 [Urine:1325; Drains:65; Blood:150] Intake/Output this shift: Total I/O In: -  Out: 505 [Urine:500; Drains:5]  General appearance: alert, cooperative and no distress Resp: breathing comfortably GI: s, non distended, approp tender.  drain serosang. Extremities: extremities normal, atraumatic, no cyanosis or edema  Lab Results:   Recent Labs  02/03/15 0520  WBC 11.4*  HGB 10.1*  HCT 31.3*  PLT 189   BMET  Recent Labs  02/03/15 0520  NA 135  K 4.8  CL 100*  CO2 29  GLUCOSE 153*  BUN 14  CREATININE 0.84  CALCIUM 8.6*   PT/INR No results for input(s): LABPROT, INR in the last 72 hours. ABG No results for input(s): PHART, HCO3 in the last 72 hours.  Invalid input(s): PCO2, PO2  Studies/Results: No results found.  Anti-infectives: Anti-infectives    Start     Dose/Rate Route Frequency Ordered Stop   02/02/15 1800  Ampicillin-Sulbactam (UNASYN) 3 g in sodium chloride 0.9 % 100 mL IVPB     3 g 100 mL/hr over 60 Minutes Intravenous Every 6 hours 02/02/15 1556 02/02/15 1936   02/02/15 0959  ceFAZolin (ANCEF) IVPB 2 g/50 mL premix     2 g 100 mL/hr over 30 Minutes Intravenous On call to O.R. 02/02/15 0959 02/02/15 1224      Assessment/Plan: s/p Procedure(s): XI ROBOTIC ASSISTED LAPAROSCOPIC SPLENECTOMY (N/A) d/c foley Advance diet Ambulate  Excedrin for headache.     LOS: 1 day    [facility] 02/03/2015

## 2015-02-04 LAB — CBC
HEMATOCRIT: 27.6 % — AB (ref 36.0–46.0)
HEMOGLOBIN: 8.8 g/dL — AB (ref 12.0–15.0)
MCH: 30.2 pg (ref 26.0–34.0)
MCHC: 31.9 g/dL (ref 30.0–36.0)
MCV: 94.8 fL (ref 78.0–100.0)
Platelets: 197 10*3/uL (ref 150–400)
RBC: 2.91 MIL/uL — ABNORMAL LOW (ref 3.87–5.11)
RDW: 13.6 % (ref 11.5–15.5)
WBC: 10.7 10*3/uL — AB (ref 4.0–10.5)

## 2015-02-04 LAB — BASIC METABOLIC PANEL
ANION GAP: 5 (ref 5–15)
BUN: 8 mg/dL (ref 6–20)
CALCIUM: 8.3 mg/dL — AB (ref 8.9–10.3)
CHLORIDE: 102 mmol/L (ref 101–111)
CO2: 27 mmol/L (ref 22–32)
Creatinine, Ser: 0.67 mg/dL (ref 0.44–1.00)
GFR calc non Af Amer: >60 mL/min (ref >60)
Glucose, Bld: 130 mg/dL — ABNORMAL HIGH (ref 65–99)
Potassium: 3.9 mmol/L (ref 3.5–5.1)
SODIUM: 134 mmol/L — AB (ref 135–145)

## 2015-02-05 LAB — BASIC METABOLIC PANEL
ANION GAP: 5 (ref 5–15)
BUN: 6 mg/dL (ref 6–20)
CALCIUM: 8.8 mg/dL — AB (ref 8.9–10.3)
CO2: 30 mmol/L (ref 22–32)
CREATININE: 0.78 mg/dL (ref 0.44–1.00)
Chloride: 102 mmol/L (ref 101–111)
Glucose, Bld: 107 mg/dL — ABNORMAL HIGH (ref 65–99)
Potassium: 3.8 mmol/L (ref 3.5–5.1)
SODIUM: 137 mmol/L (ref 135–145)

## 2015-02-05 LAB — CBC
HCT: 29.2 % — ABNORMAL LOW (ref 36.0–46.0)
Hemoglobin: 9.3 g/dL — ABNORMAL LOW (ref 12.0–15.0)
MCH: 30.9 pg (ref 26.0–34.0)
MCHC: 31.8 g/dL (ref 30.0–36.0)
MCV: 97 fL (ref 78.0–100.0)
PLATELETS: 227 10*3/uL (ref 150–400)
RBC: 3.01 MIL/uL — ABNORMAL LOW (ref 3.87–5.11)
RDW: 13.6 % (ref 11.5–15.5)
WBC: 8.8 10*3/uL (ref 4.0–10.5)

## 2015-02-05 MED ORDER — BUPIVACAINE 0.5 % ON-Q PUMP DUAL CATH 300 ML
300.0000 mL | INJECTION | Status: DC
  Filled 2015-02-05: qty 300

## 2015-02-05 NOTE — Progress Notes (Signed)
Patient ID: Shelley Sanchez, female   DOB: 1955/04/15, 58 y.o.   MRN: [id number] 3 Days Post-Op  Subjective: Doing better.  No n/v.  Pain better.    Objective: Vital signs in last 24 hours: Temp:  [98 F (36.7 C)-99.1 F (37.3 C)] 98.1 F (36.7 C) (11/11 0605) Pulse Rate:  [68-97] 70 (11/11 0605) Resp:  [12-17] 15 (11/11 0800) BP: (91-109)/(42-62) 109/62 mmHg (11/11 0605) SpO2:  [94 %-100 %] 97 % (11/11 0800) Last BM Date: 02/02/15  Intake/Output from previous day: 11/10 0701 - 11/11 0700 In: 2595.6 [P.O.:1080; I.V.:1515.6] Out: 4425 [Urine:4400; Drains:20] Intake/Output this shift:    General appearance: alert, cooperative and no distress Resp: breathing comfortably GI: s, non distended, approp tender.  drain serosang. Extremities: extremities normal, atraumatic, no cyanosis or edema  Lab Results:   Recent Labs  02/04/15 0450 02/05/15 0535  WBC 10.7* 8.8  HGB 8.8* 9.3*  HCT 27.6* 29.2*  PLT 197 227   BMET  Recent Labs  02/04/15 0450 02/05/15 0535  NA 134* 137  K 3.9 3.8  CL 102 102  CO2 27 30  GLUCOSE 130* 107*  BUN 8 6  CREATININE 0.67 0.78  CALCIUM 8.3* 8.8*   PT/INR No results for input(s): LABPROT, INR in the last 72 hours. ABG No results for input(s): PHART, HCO3 in the last 72 hours.  Invalid input(s): PCO2, PO2  Studies/Results: No results found.  Anti-infectives: Anti-infectives    Start     Dose/Rate Route Frequency Ordered Stop   02/02/15 1800  Ampicillin-Sulbactam (UNASYN) 3 g in sodium chloride 0.9 % 100 mL IVPB     3 g 100 mL/hr over 60 Minutes Intravenous Every 6 hours 02/02/15 1556 02/02/15 1936   02/02/15 0959  ceFAZolin (ANCEF) IVPB 2 g/50 mL premix     2 g 100 mL/hr over 30 Minutes Intravenous On call to O.R. 02/02/15 0959 02/02/15 1224      Assessment/Plan: s/p Procedure(s): XI ROBOTIC ASSISTED LAPAROSCOPIC SPLENECTOMY (N/A) Ambulate. D/c pca Advance to full liquids.   LOS: 3 days    [facility] 02/05/2015

## 2015-02-06 LAB — CBC
HCT: 28.6 % — ABNORMAL LOW (ref 36.0–46.0)
Hemoglobin: 9.5 g/dL — ABNORMAL LOW (ref 12.0–15.0)
MCH: 31 pg (ref 26.0–34.0)
MCHC: 33.2 g/dL (ref 30.0–36.0)
MCV: 93.5 fL (ref 78.0–100.0)
PLATELETS: 260 10*3/uL (ref 150–400)
RBC: 3.06 MIL/uL — AB (ref 3.87–5.11)
RDW: 13.2 % (ref 11.5–15.5)
WBC: 8.6 10*3/uL (ref 4.0–10.5)

## 2015-02-06 LAB — BASIC METABOLIC PANEL
Anion gap: 6 (ref 5–15)
BUN: 5 mg/dL — AB (ref 6–20)
CALCIUM: 8.7 mg/dL — AB (ref 8.9–10.3)
CO2: 28 mmol/L (ref 22–32)
Chloride: 99 mmol/L — ABNORMAL LOW (ref 101–111)
Creatinine, Ser: 0.8 mg/dL (ref 0.44–1.00)
GFR calc Af Amer: >60 mL/min (ref >60)
GLUCOSE: 111 mg/dL — AB (ref 65–99)
POTASSIUM: 3.8 mmol/L (ref 3.5–5.1)
SODIUM: 133 mmol/L — AB (ref 135–145)

## 2015-02-06 NOTE — Progress Notes (Signed)
Patient ID: Shelley Sanchez, female   DOB: 1955-04-08, 59 y.o.   MRN: [id number] [facility] Progress Note:   4 Days Post-Op  Subjective: Mental status is clear. No complaints Objective: Vital signs in last 24 hours: Temp:  [97.9 F (36.6 C)-98.9 F (37.2 C)] 97.9 F (36.6 C) (11/12 0630) Pulse Rate:  [77-102] 77 (11/12 0630) Resp:  [18] 18 (11/12 0630) BP: (108-125)/(58-64) 108/62 mmHg (11/12 0630) SpO2:  [96 %-97 %] 97 % (11/12 0630)  Intake/Output from previous day: 11/11 0701 - 11/12 0700 In: 2395.8 [P.O.:1200; I.V.:1195.8] Out: 4450 [Urine:4450] Intake/Output this shift:    Physical Exam: Work of breathing is normal.  JP serosanguinous and scant.  Pain controlled  Wants more to eat  Lab Results:  Results for orders placed or performed during the hospital encounter of 02/02/15 (from the past 48 hour(s))  CBC     Status: Abnormal   Collection Time: 02/05/15  5:35 AM  Result Value Ref Range   WBC 8.8 4.0 - 10.5 K/uL   RBC 3.01 (L) 3.87 - 5.11 MIL/uL   Hemoglobin 9.3 (L) 12.0 - 15.0 g/dL   HCT 29.2 (L) 36.0 - 46.0 %   MCV 97.0 78.0 - 100.0 fL   MCH 30.9 26.0 - 34.0 pg   MCHC 31.8 30.0 - 36.0 g/dL   RDW 13.6 11.5 - 15.5 %   Platelets 227 150 - 400 K/uL  Basic metabolic panel     Status: Abnormal   Collection Time: 02/05/15  5:35 AM  Result Value Ref Range   Sodium 137 135 - 145 mmol/L   Potassium 3.8 3.5 - 5.1 mmol/L   Chloride 102 101 - 111 mmol/L   CO2 30 22 - 32 mmol/L   Glucose, Bld 107 (H) 65 - 99 mg/dL   BUN 6 6 - 20 mg/dL   Creatinine, Ser 0.78 0.44 - 1.00 mg/dL   Calcium 8.8 (L) 8.9 - 10.3 mg/dL   GFR calc non Af Amer >60 >60 mL/min   GFR calc Af Amer >60 >60 mL/min    Comment: (NOTE) The eGFR has been calculated using the CKD EPI equation. This calculation has not been validated in all clinical situations. eGFR's persistently <60 mL/min signify possible Chronic Kidney Disease.    Anion gap 5 5 - 15  CBC     Status: Abnormal   Collection  Time: 02/06/15  4:30 AM  Result Value Ref Range   WBC 8.6 4.0 - 10.5 K/uL   RBC 3.06 (L) 3.87 - 5.11 MIL/uL   Hemoglobin 9.5 (L) 12.0 - 15.0 g/dL   HCT 28.6 (L) 36.0 - 46.0 %   MCV 93.5 78.0 - 100.0 fL   MCH 31.0 26.0 - 34.0 pg   MCHC 33.2 30.0 - 36.0 g/dL   RDW 13.2 11.5 - 15.5 %   Platelets 260 150 - 400 K/uL  Basic metabolic panel     Status: Abnormal   Collection Time: 02/06/15  4:30 AM  Result Value Ref Range   Sodium 133 (L) 135 - 145 mmol/L   Potassium 3.8 3.5 - 5.1 mmol/L   Chloride 99 (L) 101 - 111 mmol/L   CO2 28 22 - 32 mmol/L   Glucose, Bld 111 (H) 65 - 99 mg/dL   BUN 5 (L) 6 - 20 mg/dL   Creatinine, Ser 0.80 0.44 - 1.00 mg/dL   Calcium 8.7 (L) 8.9 - 10.3 mg/dL   GFR calc non Af Amer >60 >60 mL/min   GFR  calc Af Amer >60 >60 mL/min    Comment: (NOTE) The eGFR has been calculated using the CKD EPI equation. This calculation has not been validated in all clinical situations. eGFR's persistently <60 mL/min signify possible Chronic Kidney Disease.    Anion gap 6 5 - 15    Radiology/Results: No results found.  Anti-infectives: Anti-infectives    Start     Dose/Rate Route Frequency Ordered Stop   02/02/15 1800  Ampicillin-Sulbactam (UNASYN) 3 g in sodium chloride 0.9 % 100 mL IVPB     3 g 100 mL/hr over 60 Minutes Intravenous Every 6 hours 02/02/15 1556 02/02/15 1936   02/02/15 0959  ceFAZolin (ANCEF) IVPB 2 g/50 mL premix     2 g 100 mL/hr over 30 Minutes Intravenous On call to O.R. 02/02/15 0959 02/02/15 1224      Assessment/Plan: Problem List: Patient Active Problem List   Diagnosis Date Noted  . Elevated tumor markers 01/17/2015  . Immunization counseling 01/17/2015  . 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 Screening for colon cancer 01/05/2015  . Other fatigue 01/05/2015  . Multiple pulmonary nodules 01/06/2014  . Squamous cell carcinoma of right hand   . Ovarian cancer on right ([place]) 05/21/2012  . Hypothyroid 03/07/2012    Diet advance 4  Days Post-Op    LOS: 4 days   Matt B. Hassell Done, MD, [facility] [phone] beeper [phone]  02/06/2015 8:48 AM

## 2015-02-07 LAB — BASIC METABOLIC PANEL
Anion gap: 6 (ref 5–15)
BUN: 10 mg/dL (ref 6–20)
CHLORIDE: 106 mmol/L (ref 101–111)
CO2: 28 mmol/L (ref 22–32)
Calcium: 8.8 mg/dL — ABNORMAL LOW (ref 8.9–10.3)
Creatinine, Ser: 0.72 mg/dL (ref 0.44–1.00)
GFR calc Af Amer: >60 mL/min (ref >60)
GFR calc non Af Amer: >60 mL/min (ref >60)
GLUCOSE: 116 mg/dL — AB (ref 65–99)
POTASSIUM: 4.2 mmol/L (ref 3.5–5.1)
Sodium: 140 mmol/L (ref 135–145)

## 2015-02-07 LAB — CBC
HCT: 31.2 % — ABNORMAL LOW (ref 36.0–46.0)
HEMOGLOBIN: 9.9 g/dL — AB (ref 12.0–15.0)
MCH: 30.3 pg (ref 26.0–34.0)
MCHC: 31.7 g/dL (ref 30.0–36.0)
MCV: 95.4 fL (ref 78.0–100.0)
Platelets: 345 10*3/uL (ref 150–400)
RBC: 3.27 MIL/uL — AB (ref 3.87–5.11)
RDW: 13.4 % (ref 11.5–15.5)
WBC: 8.8 10*3/uL (ref 4.0–10.5)

## 2015-02-07 MED ORDER — LIP MEDEX EX OINT
TOPICAL_OINTMENT | CUTANEOUS | Status: AC
  Administered 2015-02-07: 13:00:00
  Filled 2015-02-07: qty 7

## 2015-02-07 NOTE — Progress Notes (Signed)
Patient ID: Shelley Sanchez, female   DOB: Dec 21, 1955, 59 y.o.   MRN: [id number] [facility] Progress Note:   5 Days Post-Op  Subjective: Mental status is clear.  Working on incentive spirometry Objective: Vital signs in last 24 hours: Temp:  [98 F (36.7 C)-98.6 F (37 C)] 98.6 F (37 C) (11/13 0600) Pulse Rate:  [70-83] 70 (11/13 0600) Resp:  [14-16] 14 (11/13 0600) BP: (105-113)/(55-59) 105/55 mmHg (11/13 0600) SpO2:  [97 %-98 %] 98 % (11/13 0600)  Intake/Output from previous day: 11/12 0701 - 11/13 0700 In: 1240 [P.O.:240; I.V.:1000] Out: 525 [Urine:525] Intake/Output this shift:    Physical Exam: Work of breathing is normal.  JP serous;  On Q pumps in place.    Lab Results:  Results for orders placed or performed during the hospital encounter of 02/02/15 (from the past 48 hour(s))  CBC     Status: Abnormal   Collection Time: 02/06/15  4:30 AM  Result Value Ref Range   WBC 8.6 4.0 - 10.5 K/uL   RBC 3.06 (L) 3.87 - 5.11 MIL/uL   Hemoglobin 9.5 (L) 12.0 - 15.0 g/dL   HCT 28.6 (L) 36.0 - 46.0 %   MCV 93.5 78.0 - 100.0 fL   MCH 31.0 26.0 - 34.0 pg   MCHC 33.2 30.0 - 36.0 g/dL   RDW 13.2 11.5 - 15.5 %   Platelets 260 150 - 400 K/uL  Basic metabolic panel     Status: Abnormal   Collection Time: 02/06/15  4:30 AM  Result Value Ref Range   Sodium 133 (L) 135 - 145 mmol/L   Potassium 3.8 3.5 - 5.1 mmol/L   Chloride 99 (L) 101 - 111 mmol/L   CO2 28 22 - 32 mmol/L   Glucose, Bld 111 (H) 65 - 99 mg/dL   BUN 5 (L) 6 - 20 mg/dL   Creatinine, Ser 0.80 0.44 - 1.00 mg/dL   Calcium 8.7 (L) 8.9 - 10.3 mg/dL   GFR calc non Af Amer >60 >60 mL/min   GFR calc Af Amer >60 >60 mL/min    Comment: (NOTE) The eGFR has been calculated using the CKD EPI equation. This calculation has not been validated in all clinical situations. eGFR's persistently <60 mL/min signify possible Chronic Kidney Disease.    Anion gap 6 5 - 15  CBC     Status: Abnormal   Collection Time:  02/07/15  6:00 AM  Result Value Ref Range   WBC 8.8 4.0 - 10.5 K/uL   RBC 3.27 (L) 3.87 - 5.11 MIL/uL   Hemoglobin 9.9 (L) 12.0 - 15.0 g/dL   HCT 31.2 (L) 36.0 - 46.0 %   MCV 95.4 78.0 - 100.0 fL   MCH 30.3 26.0 - 34.0 pg   MCHC 31.7 30.0 - 36.0 g/dL   RDW 13.4 11.5 - 15.5 %   Platelets 345 150 - 400 K/uL  Basic metabolic panel     Status: Abnormal   Collection Time: 02/07/15  6:00 AM  Result Value Ref Range   Sodium 140 135 - 145 mmol/L    Comment: DELTA CHECK NOTED REPEATED TO VERIFY    Potassium 4.2 3.5 - 5.1 mmol/L   Chloride 106 101 - 111 mmol/L   CO2 28 22 - 32 mmol/L   Glucose, Bld 116 (H) 65 - 99 mg/dL   BUN 10 6 - 20 mg/dL   Creatinine, Ser 0.72 0.44 - 1.00 mg/dL   Calcium 8.8 (L) 8.9 - 10.3 mg/dL  GFR calc non Af Amer >60 >60 mL/min   GFR calc Af Amer >60 >60 mL/min    Comment: (NOTE) The eGFR has been calculated using the CKD EPI equation. This calculation has not been validated in all clinical situations. eGFR's persistently <60 mL/min signify possible Chronic Kidney Disease.    Anion gap 6 5 - 15    Radiology/Results: No results found.  Anti-infectives: Anti-infectives    Start     Dose/Rate Route Frequency Ordered Stop   02/02/15 1800  Ampicillin-Sulbactam (UNASYN) 3 g in sodium chloride 0.9 % 100 mL IVPB     3 g 100 mL/hr over 60 Minutes Intravenous Every 6 hours 02/02/15 1556 02/02/15 1936   02/02/15 0959  ceFAZolin (ANCEF) IVPB 2 g/50 mL premix     2 g 100 mL/hr over 30 Minutes Intravenous On call to O.R. 02/02/15 0959 02/02/15 1224      Assessment/Plan: Problem List: Patient Active Problem List   Diagnosis Date Noted  . Elevated tumor markers 01/17/2015  . Immunization counseling 01/17/2015  . 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 Screening for colon cancer 01/05/2015  . Other fatigue 01/05/2015  . Multiple pulmonary nodules 01/06/2014  . Squamous cell carcinoma of right hand   . Ovarian cancer on right ([place]) 05/21/2012  .  Hypothyroid 03/07/2012    Plan discharge tomorrow with discontinue of JP and On Q pumps.   5 Days Post-Op    LOS: 5 days   Matt B. Hassell Done, MD, [facility] [phone] beeper [phone]  02/07/2015 9:11 AM

## 2015-02-08 MED ORDER — OXYCODONE HCL 5 MG PO TABS: 5.0000 mg | ORAL_TABLET | ORAL | Status: DC | PRN

## 2015-02-08 NOTE — Discharge Summary (Signed)
Physician Discharge Summary  Patient ID: Shelley Sanchez MRN: [id number] DOB/AGE: Aug 15, 1955 59 y.o.  Admit date: 02/02/2015 Discharge date: 02/08/2015  Admission Diagnoses: Patient Active Problem List   Diagnosis Date Noted  . Elevated tumor markers 01/17/2015  . Immunization counseling 01/17/2015  . 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 Screening for colon cancer 01/05/2015  . Other fatigue 01/05/2015  . Multiple pulmonary nodules 01/06/2014  . Squamous cell carcinoma of right hand   . Ovarian cancer on right ([place]) 05/21/2012  . 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  She had some nausea and pain at first.  NGT was left in place at time of surgery due to significant handling of stomach.  Also, she had a drain left due to panc tail in hilum of spleen.  Her gastric ileus was allowed to resolve.  NGT was removed and her diet was slowly advanced.  She was able to void spontaneously.  She was also able to ambulate independently.  We transitioned to oral pain medications.  Drain was removed.  She was discharged to home in stable condition.    Consults: None  Significant Diagnostic Studies: labs: HCT 35.7 prior to d/c.    Treatments: surgery: see above  Discharge Exam: Blood pressure 111/61, pulse 74, temperature 98.1 F (36.7 C), temperature source Oral, resp. rate 16, height 5\' 8"  (1.727 m), weight 75.297 kg (166 lb), SpO2 98 %. General appearance: alert, cooperative and no distress Resp: breathing comfortably GI: soft, non distended, approp tender.  incisions c/d/i.  Disposition: 01-Home or Self Care  Discharge Instructions    Call MD for:  persistant nausea and vomiting    Complete by:  As directed      Call MD for:  redness, tenderness, or signs of infection (pain, swelling, redness, odor or green/yellow  discharge around incision site)    Complete by:  As directed      Call MD for:  severe uncontrolled pain    Complete by:  As directed      Call MD for:  temperature >100.4    Complete by:  As directed      Diet - low sodium heart healthy    Complete by:  As directed      Increase activity slowly    Complete by:  As directed      No dressing needed    Complete by:  As directed             Medication List    TAKE these medications        acetaminophen 500 MG tablet  Commonly known as:  TYLENOL  Take 1,000 mg by mouth every 6 (six) hours as needed for mild pain or headache.     aspirin-acetaminophen-caffeine 250-250-65 MG tablet  Commonly known as:  EXCEDRIN MIGRAINE  Take 2 tablets by mouth every 6 (six) hours as needed for headache.     ibuprofen 200 MG tablet  Commonly known as:  ADVIL,MOTRIN  Take 400 mg by mouth every 6 (six) hours as needed for headache. For pain     oxyCODONE 5 MG immediate release tablet  Commonly known as:  Oxy IR/ROXICODONE  Take 1-2 tablets (5-10 mg total) by mouth every 4 (four) hours as needed for moderate pain.     oxyCODONE-acetaminophen 5-325 MG tablet  Commonly known as:  PERCOCET/ROXICET  Take 1 tablet by mouth every 4 (four) hours as needed for severe pain (severe headache unrelieved by Excedrin).           Follow-up Information    Follow up with [facility], MD.   Specialty:  General Surgery   Contact information:   [address] [place] [address] [phone]       Signed: Stark Klein 02/08/2015, 7:45 AM

## 2015-02-08 NOTE — Discharge Instructions (Signed)
[facility]      [facility] °[phone] ° °ABDOMINAL SURGERY: POST OP INSTRUCTIONS ° °Always review your discharge instruction sheet given to you by the facility where your surgery was performed. ° °IF YOU HAVE DISABILITY OR FAMILY LEAVE FORMS, YOU MUST BRING THEM TO THE OFFICE FOR PROCESSING.  PLEASE DO NOT GIVE THEM TO YOUR DOCTOR. ° °1. A prescription for pain medication may be given to you upon discharge.  Take your pain medication as prescribed, if needed.  If narcotic pain medicine is not needed, then you may take acetaminophen (Tylenol) or ibuprofen (Advil) as needed. °2. Take your usually prescribed medications unless otherwise directed. °3. If you need a refill on your pain medication, please contact your pharmacy. They will contact our office to request authorization.  Prescriptions will not be filled after 5pm or on week-ends. °4. You should follow a light diet the first few days after arrival home, such as soup and crackers, pudding, etc.unless your doctor has advised otherwise. A high-fiber, low fat diet can be resumed as tolerated.   Be sure to include lots of fluids daily. Most patients will experience some swelling and bruising on the chest and neck area.  Ice packs will help.  Swelling and bruising can take several days to resolve °5. Most patients will experience some swelling and bruising in the area of the incision. Ice pack will help. Swelling and bruising can take several days to resolve..  °6. It is common to experience some constipation if taking pain medication after surgery.  Increasing fluid intake and taking a stool softener will usually help or prevent this problem from occurring.  A mild laxative (Milk of Magnesia or Miralax) should be taken according to package directions if there are no bowel movements after 48 hours. °7.  You may have steri-strips (small skin tapes) in place directly over the incision.  These strips should be left on the skin for 10-14 days.  If your  surgeon used skin glue on the incision, you may shower in 48 hours.  The glue will flake off over the next 2-3 weeks.  Any sutures or staples will be removed at the office during your follow-up visit. You may find that a light gauze bandage over your incision may keep your staples from being rubbed or pulled. You may shower and replace the bandage daily. °8. ACTIVITIES:  You may resume regular (light) daily activities beginning the next day--such as daily self-care, walking, climbing stairs--gradually increasing activities as tolerated.  You may have sexual intercourse when it is comfortable.  Refrain from any heavy lifting or straining until approved by your doctor. °a. You may drive when you no longer are taking prescription pain medication, you can comfortably wear a seatbelt, and you can safely maneuver your car and apply brakes °b. Return to Work: __________8 weeks if applicable_________________________ °9. You should see your doctor in the office for a follow-up appointment approximately two weeks after your surgery.  Make sure that you call for this appointment within a day or two after you arrive home to insure a convenient appointment time. °OTHER INSTRUCTIONS:  °_____________________________________________________________ °_____________________________________________________________ ° °WHEN TO CALL YOUR DOCTOR: °1. Fever over 101.0 °2. Inability to urinate °3. Nausea and/or vomiting °4. Extreme swelling or bruising °5. Continued bleeding from incision. °6. Increased pain, redness, or drainage from the incision. °7. Difficulty swallowing or breathing °8. Muscle cramping or spasms. °9. Numbness or tingling in hands or feet or around lips. ° °The clinic staff is   available to answer your questions during regular business hours.  Please don’t hesitate to call and ask to speak to one of the nurses if you have concerns. ° °For further questions, please visit [email or web address] ° ° ° °

## 2015-02-08 NOTE — Progress Notes (Signed)
Patient tolerating regular diet, ambulating in hall, passing gas, pain controlled with oral pain meds..  JP drain and staples removed per MD order.  Discharge instructions reviewed with patient.  Patient verbalizes understanding.  Prescription given to patient.  Patient discharged to home.

## 2015-02-12 ENCOUNTER — Other Ambulatory Visit: Payer: Self-pay | Admitting: *Deleted

## 2015-02-12 DIAGNOSIS — C7889 Secondary malignant neoplasm of other digestive organs: Secondary | ICD-10-CM

## 2015-02-13 ENCOUNTER — Other Ambulatory Visit: Payer: Self-pay | Admitting: Oncology

## 2015-02-15 ENCOUNTER — Encounter: Payer: Self-pay | Admitting: Oncology

## 2015-02-15 ENCOUNTER — Other Ambulatory Visit (HOSPITAL_BASED_OUTPATIENT_CLINIC_OR_DEPARTMENT_OTHER): Payer: BLUE CROSS/BLUE SHIELD

## 2015-02-15 ENCOUNTER — Ambulatory Visit (HOSPITAL_BASED_OUTPATIENT_CLINIC_OR_DEPARTMENT_OTHER): Payer: BLUE CROSS/BLUE SHIELD | Admitting: Oncology

## 2015-02-15 VITALS — BP 107/53 | HR 83 | Temp 98.1°F | Resp 18 | Ht 68.0 in | Wt 166.3 lb

## 2015-02-15 DIAGNOSIS — G609 Hereditary and idiopathic neuropathy, unspecified: Secondary | ICD-10-CM

## 2015-02-15 DIAGNOSIS — C561 Malignant neoplasm of right ovary: Secondary | ICD-10-CM

## 2015-02-15 DIAGNOSIS — D508 Other iron deficiency anemias: Secondary | ICD-10-CM

## 2015-02-15 DIAGNOSIS — C7889 Secondary malignant neoplasm of other digestive organs: Secondary | ICD-10-CM

## 2015-02-15 LAB — COMPREHENSIVE METABOLIC PANEL (CC13)
ALBUMIN: 3.6 g/dL (ref 3.5–5.0)
ALK PHOS: 101 U/L (ref 40–150)
ALT: 33 U/L (ref 0–55)
ANION GAP: 8 meq/L (ref 3–11)
AST: 23 U/L (ref 5–34)
BUN: 18.3 mg/dL (ref 7.0–26.0)
CALCIUM: 9.7 mg/dL (ref 8.4–10.4)
CO2: 28 mEq/L (ref 22–29)
Chloride: 104 mEq/L (ref 98–109)
Creatinine: 0.8 mg/dL (ref 0.6–1.1)
EGFR: 83 mL/min/{1.73_m2} — AB (ref >90)
Glucose: 91 mg/dl (ref 70–140)
POTASSIUM: 4.2 meq/L (ref 3.5–5.1)
Sodium: 140 mEq/L (ref 136–145)
Total Bilirubin: <0.3 mg/dL (ref 0.20–1.20)
Total Protein: 7.3 g/dL (ref 6.4–8.3)

## 2015-02-15 LAB — CBC WITH DIFFERENTIAL/PLATELET
BASO%: 0.9 % (ref 0.0–2.0)
BASOS ABS: 0.1 10*3/uL (ref 0.0–0.1)
EOS%: 5.7 % (ref 0.0–7.0)
Eosinophils Absolute: 0.6 10*3/uL — ABNORMAL HIGH (ref 0.0–0.5)
HEMATOCRIT: 35.7 % (ref 34.8–46.6)
HEMOGLOBIN: 11.4 g/dL — AB (ref 11.6–15.9)
LYMPH#: 2.5 10*3/uL (ref 0.9–3.3)
LYMPH%: 25.7 % (ref 14.0–49.7)
MCH: 29.9 pg (ref 25.1–34.0)
MCHC: 31.9 g/dL (ref 31.5–36.0)
MCV: 93.7 fL (ref 79.5–101.0)
MONO#: 0.7 10*3/uL (ref 0.1–0.9)
MONO%: 7.4 % (ref 0.0–14.0)
NEUT#: 5.9 10*3/uL (ref 1.5–6.5)
NEUT%: 60.3 % (ref 38.4–76.8)
PLATELETS: 683 10*3/uL — AB (ref 145–400)
RBC: 3.81 10*6/uL (ref 3.70–5.45)
RDW: 13.5 % (ref 11.2–14.5)
WBC: 9.8 10*3/uL (ref 3.9–10.3)
nRBC: 0 % (ref 0–0)

## 2015-02-15 MED ORDER — FERROUS FUMARATE 325 (106 FE) MG PO TABS: ORAL_TABLET | ORAL | Status: DC

## 2015-02-15 NOTE — Progress Notes (Signed)
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 Surgery required en bloc resection of tail of pancreas and spleen was larger than appreciated on scans. Even so patient did well, discharged from hospital 02-08-15 and to see Dr Barry Dienes next on Dec 5.  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 She is ambulatory and increasing activity slowly. She has not needed regular pain medication, bowels are moving, and she is eating and drinking fluids adequately. She has had no fever or symptoms of infection. The incision is not draining or particularly unncomfortable. She denies SOB or chest pain, LE swelling. She has had no bleeding.  No bladder symptoms.  Remainder of 10 point Review of Systems negative/ unchanged.  Flu vaccine 01-19-15 No PAC Genetics testing normal 08-2013 ([employer] panel) Pneumovax, meningococcal vaccine and Hemophilus influenza all 01-14-15   ONCOLOGIC HISTORY Patient was in usual excellent health thru Sept 2013,then in Oct 2013 more fatigued, with some fecal urge incontinence and abdominal fullness such that she began to diet to lose weight. By Marcene Duos she could not climb one flight of stairs and could walk only from office to car without SOB and coughing. She was seen at urgent care in Dec with bilateral pleural effusions on CXR, referred to  Dr Clois Comber, with 1.6 liter right thoracentesis done 03-11-12 (no path in this EMR). She had CT CAP and CT angio chest with large bilateral pleural effusions, no PE, ascites and pelvic mass. She had paracentesis for 1.4 liters on 04-02-12 and left thoracentesis for 2 liters on 04-05-12. CA 125 was 2418. She was seen in consultation by Dr Skeet Latch, with TAH/BSO, radical debulking and omentectomy on 04-09-2012, which was optimal debulking with only residual at completion of procedure being 7 mm area on posterior surface of right lobe liver. She was transfused 2 units PRBCs on 04-10-12 for Hgb down to 7.2 (from 12 preop). 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 Adjuvant therapy with dose dense taxol/ carboplatin began 05-03-12, with CA125 down to 30 by early April. Cycle 6 was completed 09-10-12. CA 125 remained in good range, including 14 in 03-2009 and 25 on 09-24-14, however was 88 in early Oct 2016. CT CAP 01-11-15 found mass at anterior aspect of spleen 11.5 x 8.2 x 13 cm, with stable unremarkable chest, no ascites, no adenopathy, no pelvic masses. 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 There was no visualized abdominal carcinomatosis; tail of pancreas was adherent to mass and taken with spleen.     Objective:  Vital signs in last 24 hours:  BP 107/53 mmHg  Pulse 83  Temp(Src) 98.1 F (36.7 C) (Oral)  Resp 18  Ht '5\' 8"'  (1.727 m)  Wt 166 lb 4.8 oz (75.433 kg)  BMI 25.29  kg/m2  SpO2 100% Weight stable Alert, oriented and appropriate. Ambulatory carefully but without assistance. Respirations not labored RA. NAD. Very pleasant as always.   HEENT:PERRL, sclerae not icteric. Oral mucosa moist without lesions, posterior pharynx clear.  Neck supple. No JVD.   Lymphatics:no supraclavicular or inguinal adenopathy Resp: clear to auscultation bilaterally and normal percussion bilaterally Cardio: regular rate and rhythm. No gallop. GI: soft, nontender, not distended. Some bowel sounds. Surgical incision RUQ appears to be healing well, no erythema or unusual tenderness. No drain now. Musculoskeletal/ Extremities: without pitting edema, cords, tenderness Neuro: no peripheral neuropathy. Otherwise nonfocal Skin without rash, ecchymosis, petechiae  Lab Results:  Results for orders placed or performed in visit on 02/15/15  CBC with Differential  Result Value Ref Range   WBC 9.8 3.9 - 10.3 10e3/uL   NEUT# 5.9 1.5 - 6.5 10e3/uL   HGB 11.4 (L) 11.6 - 15.9 g/dL   HCT 35.7 34.8 - 46.6 %   Platelets 683 (H) 145 - 400 10e3/uL   MCV 93.7 79.5 - 101.0 fL   MCH 29.9 25.1 - 34.0 pg   MCHC 31.9 31.5 - 36.0 g/dL   RBC 3.81 3.70 - 5.45 10e6/uL   RDW 13.5 11.2 - 14.5 %   lymph# 2.5 0.9 - 3.3 10e3/uL   MONO# 0.7 0.1 - 0.9 10e3/uL   Eosinophils Absolute 0.6 (H) 0.0 - 0.5 10e3/uL   Basophils Absolute 0.1 0.0 - 0.1 10e3/uL   NEUT% 60.3 38.4 - 76.8 %   LYMPH% 25.7 14.0 - 49.7 %   MONO% 7.4 0.0 - 14.0 %   EOS% 5.7 0.0 - 7.0 %   BASO% 0.9 0.0 - 2.0 %   nRBC 0 0 - 0 %  Comprehensive metabolic panel ([facility]) - [facility]  Result Value Ref Range   Sodium 140 136 - 145 mEq/L   Potassium 4.2 3.5 - 5.1 mEq/L   Chloride 104 98 - 109 mEq/L   CO2 28 22 - 29 mEq/L   Glucose 91 70 - 140 mg/dl   BUN 18.3 7.0 - 26.0 mg/dL   Creatinine 0.8 0.6 - 1.1 mg/dL   Total Bilirubin <0.30 0.20 - 1.20 mg/dL   Alkaline Phosphatase 101 40 - 150 U/L   AST 23 5 - 34 U/L   ALT 33 0 - 55 U/L   Total Protein 7.3 6.4 - 8.3 g/dL   Albumin 3.6 3.5 - 5.0 g/dL   Calcium 9.7 8.4 - 10.4 mg/dL   Anion Gap 8 3 - 11 mEq/L   EGFR 83 (L) >90 ml/min/1.73 m2   CA 125 not repeated today as would not be accurate this close to surgery  Studies/Results: FINAL for SCOTLYN, MCCRANIE ([id number]) Patient:  RIM, THATCH Collected: 02/02/2015 Client: [facility] Accession: [id number] Received: 02/02/2015 Stark Klein REPORT OF SURGICAL PATHOLOGY FINAL DIAGNOSIS Diagnosis Spleen - HIGH GRADE SEROUS CARCINOMA INVOLVING SPLENIC PARENCHYMA AND CAPSULE, 9 CM IN GREATEST DIMENSION. Specimen Gross and Clinical Information Specimen(s) Obtained: Spleen Specimen Clinical Information malignant splenic mass [rd] Gross Received fresh is an 800 gram, 15 x 11 x 7 cm spleen, which has a small amount of attached soft fat at the hilum. On sectioning, there is a 9 x 9 x 7 cm tan white to focally dark red firm well-defined mass with focal friable cut surfaces. The mass grossly involves a portion of the capsule. Uninvolved parenchyma is red purple, soft with non-prominent white pulp. Found within the fat at the hilum are five possible lymph nodes ranging  from 0.5 to 1.5 cm.     Medications: I have reviewed the patient's current medications.  DISCUSSION: Explained to patient that there should be lower threshold to begin antibiotics in her if infection from now on due to absence of spleen. Reviewed pathology with patient and mentioned pertinent information from Dr [facility] operative note.  I think likely best to give possibly 4 cycles of chemotherapy after cleared by Dr Barry Dienes from this surgery, but will ask Dr Skeet Latch to review all information and also give recommendations in this regard. Patient understands that recovering from the surgery is first priority now, reviewed good nutrition including good protein intake. Note patient did not have PET prior to surgery.   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 Recurrence with 9 cm mass in splenic hilum at splenectomy 02-02-15, CA 125 elevated at 88 preoperatively. Now recovering from the splenectomy. I will discuss  additional chemotherapy with Dr Skeet Latch and will see patient back in ~ 2 weeks with labs.  2.Pre-splenectomy vaccines given 01-14-15, and flu vaccine 01-19-15 3. Repeat TFTs by PCP ok 4. mammograms up to date and not remarkable 5. Has not had colonoscopy. Referral to GI previously, may need to defer this until after additional chemotherapy 6.slight preexisting peripheral neuropathy fingertips, not particularly worse with previous taxol 7. 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 Time spent 30 min including >50% counseling and coordination of care. CC Drs Carolee Rota, Geraldo Docker, MD   02/15/2015, 2:48 PM

## 2015-02-17 ENCOUNTER — Telehealth: Payer: Self-pay | Admitting: Oncology

## 2015-02-17 NOTE — Telephone Encounter (Signed)
s.w. pt and advised on DEC appt.....pt ok adn aware °

## 2015-03-10 ENCOUNTER — Other Ambulatory Visit: Payer: Self-pay | Admitting: Oncology

## 2015-03-11 ENCOUNTER — Encounter: Payer: Self-pay | Admitting: Oncology

## 2015-03-11 ENCOUNTER — Telehealth: Payer: Self-pay | Admitting: Oncology

## 2015-03-11 ENCOUNTER — Telehealth: Payer: Self-pay

## 2015-03-11 ENCOUNTER — Ambulatory Visit (HOSPITAL_BASED_OUTPATIENT_CLINIC_OR_DEPARTMENT_OTHER): Payer: BLUE CROSS/BLUE SHIELD | Admitting: Oncology

## 2015-03-11 ENCOUNTER — Other Ambulatory Visit (HOSPITAL_BASED_OUTPATIENT_CLINIC_OR_DEPARTMENT_OTHER): Payer: BLUE CROSS/BLUE SHIELD

## 2015-03-11 VITALS — BP 104/69 | HR 77 | Temp 98.1°F | Resp 18 | Ht 68.0 in | Wt 166.9 lb

## 2015-03-11 DIAGNOSIS — Z85820 Personal history of malignant melanoma of skin: Secondary | ICD-10-CM | POA: Diagnosis not present

## 2015-03-11 DIAGNOSIS — G620 Drug-induced polyneuropathy: Secondary | ICD-10-CM | POA: Diagnosis not present

## 2015-03-11 DIAGNOSIS — C561 Malignant neoplasm of right ovary: Secondary | ICD-10-CM

## 2015-03-11 DIAGNOSIS — C569 Malignant neoplasm of unspecified ovary: Secondary | ICD-10-CM | POA: Insufficient documentation

## 2015-03-11 DIAGNOSIS — Z9081 Acquired absence of spleen: Secondary | ICD-10-CM

## 2015-03-11 LAB — CBC WITH DIFFERENTIAL/PLATELET
BASO%: 0.9 % (ref 0.0–2.0)
BASOS ABS: 0.1 10*3/uL (ref 0.0–0.1)
EOS ABS: 0.5 10*3/uL (ref 0.0–0.5)
EOS%: 8.4 % — AB (ref 0.0–7.0)
HCT: 42.8 % (ref 34.8–46.6)
HGB: 13.4 g/dL (ref 11.6–15.9)
LYMPH%: 29.6 % (ref 14.0–49.7)
MCH: 29.8 pg (ref 25.1–34.0)
MCHC: 31.3 g/dL — AB (ref 31.5–36.0)
MCV: 95.1 fL (ref 79.5–101.0)
MONO#: 0.6 10*3/uL (ref 0.1–0.9)
MONO%: 8.7 % (ref 0.0–14.0)
NEUT#: 3.4 10*3/uL (ref 1.5–6.5)
NEUT%: 52.4 % (ref 38.4–76.8)
Platelets: 359 10*3/uL (ref 145–400)
RBC: 4.5 10*6/uL (ref 3.70–5.45)
RDW: 14.4 % (ref 11.2–14.5)
WBC: 6.5 10*3/uL (ref 3.9–10.3)
lymph#: 1.9 10*3/uL (ref 0.9–3.3)

## 2015-03-11 LAB — COMPREHENSIVE METABOLIC PANEL
ALBUMIN: 4.2 g/dL (ref 3.5–5.0)
ALT: 24 U/L (ref 0–55)
AST: 17 U/L (ref 5–34)
Alkaline Phosphatase: 110 U/L (ref 40–150)
Anion Gap: 11 mEq/L (ref 3–11)
BUN: 18 mg/dL (ref 7.0–26.0)
CHLORIDE: 105 meq/L (ref 98–109)
CO2: 24 meq/L (ref 22–29)
CREATININE: 1 mg/dL (ref 0.6–1.1)
Calcium: 9.7 mg/dL (ref 8.4–10.4)
EGFR: 60 mL/min/{1.73_m2} — AB (ref >90)
GLUCOSE: 88 mg/dL (ref 70–140)
POTASSIUM: 4.7 meq/L (ref 3.5–5.1)
SODIUM: 140 meq/L (ref 136–145)
Total Protein: 7.9 g/dL (ref 6.4–8.3)

## 2015-03-11 MED ORDER — DEXAMETHASONE 4 MG PO TABS: ORAL_TABLET | ORAL | Status: DC

## 2015-03-11 MED ORDER — ONDANSETRON HCL 8 MG PO TABS: 8.0000 mg | ORAL_TABLET | Freq: Three times a day (TID) | ORAL | Status: DC | PRN

## 2015-03-11 MED ORDER — LORAZEPAM 0.5 MG PO TABS: ORAL_TABLET | ORAL | Status: DC

## 2015-03-11 NOTE — Progress Notes (Signed)
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 Plan is to give 4 cycles of carboplatin taxol next. She saw Dr Barry Dienes on 02-23-15, with prn follow up there. She will see Dr Skeet Latch on 04-08-15. Last imaging was CT CAP 01-11-15.   Patient has continued to recover well from the splenectomy, which was more involved than originally anticipated and included en bloc resection of tail of pancreas. She has some vague minimal discomfort LUQ and was more tender there on Dr Marlowe Aschoff last exam; she has not needed any pain medication in several weeks. 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 Bowels are moving regularly. She has no other abdominal or pelvic discomfort, no SOB or chest pain, no LE swelling, no bleeding. She has had no fever or symptoms of infection. 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 Remainder of 10 point Review of Systems negative.   Flu vaccine 01-19-15 No PAC. All of previous chemo done peripheral Genetics testing normal 08-2013 ([facility] panel) Pneumovax, meningococcal vaccine and Hemophilus influenza all 01-14-15  ONCOLOGIC HISTORY  Patient was in usual excellent health thru Sept 2013,then in Oct 2013 more fatigued, with some fecal urge incontinence and abdominal fullness such that she began to diet to lose weight. By Marcene Duos she could not climb one flight of stairs and could walk only from office to car without SOB and coughing. She was seen at urgent care in Dec with bilateral pleural effusions on CXR, referred to Dr Clois Comber, with 1.6 liter right thoracentesis done 03-11-12 (no path in this EMR). She had CT  CAP and CT angio chest with large bilateral pleural effusions, no PE, ascites and pelvic mass. She had paracentesis for 1.4 liters on 04-02-12 and left thoracentesis for 2 liters on 04-05-12. CA 125 was 2418. She was seen in consultation by Dr Skeet Latch, with TAH/BSO, radical debulking and omentectomy on 04-09-2012, which was optimal debulking with only residual at completion of procedure being 7 mm area on posterior surface of right lobe liver. She was transfused 2 units PRBCs on 04-10-12 for Hgb down to 7.2 (from 12 preop). Pathology [id number]) high grade serous carcinoma involving right pelvic mass, serosa of uterus and peritoneum, omentum negative, no nodes submitted, felt IIIC right ovarian primary. Adjuvant therapy with dose dense taxol/ carboplatin began 05-03-12, with CA125 down to 30 by early April. Cycle 6 was completed 09-10-12. CA 125 remained in good range, including 14 in 03-2009 and 25 on 09-24-14, however was 88 in early Oct 2016. CT CAP 01-11-15 found mass at anterior aspect of spleen 11.5 x 8.2 x 13 cm, with stable unremarkable chest, no ascites, no adenopathy, no pelvic masses. She had splenectomy by Dr Barry Dienes 02-02-15, pathology [phone]) high grade serous carcinoma involving splenic capsule and parenchyma, 9 cm. There was no visualized abdominal carcinomatosis; tail of pancreas was adherent to mass and taken with spleen.   Objective:  Vital signs in last 24 hours:  BP 104/69 mmHg  Pulse 77  Temp(Src) 98.1 F (36.7 C) (Oral)  Resp 18  Ht '5\' 8"'  (1.727 m)  Wt 166 lb 14.4 oz (75.705 kg)  BMI 25.38 kg/m2  SpO2 99% Weight up 0.5  lb Alert, oriented and appropriate. Ambulatory without assistance and much more easily mobile in exam room.  No alopecia  HEENT:PERRL, sclerae not icteric. Oral mucosa moist without lesions, posterior pharynx clear.  Neck supple. No JVD.  Lymphatics:no cervical,supraclavicular or inguinal adenopathy Resp: clear to auscultation bilaterally and normal percussion  bilaterally Cardio: regular rate and rhythm. No gallop. GI: soft, minimally tender to gentle exam LUQ, not distended, no mass or organomegaly. Normally active bowel sounds. Surgical incision healed, unremarkable Musculoskeletal/ Extremities: without pitting edema, cords, tenderness Neuro: no significant peripheral neuropathy. Otherwise nonfocal. PSYCH appropriate mood and affect Skin without rash, ecchymosis, petechiae No central line  Lab Results:  Results for orders placed or performed in visit on 03/11/15  CA 125  Result Value Ref Range   CA 125 51 (H) <35 U/mL  CBC with Differential  Result Value Ref Range   WBC 6.5 3.9 - 10.3 10e3/uL   NEUT# 3.4 1.5 - 6.5 10e3/uL   HGB 13.4 11.6 - 15.9 g/dL   HCT 42.8 34.8 - 46.6 %   Platelets 359 145 - 400 10e3/uL   MCV 95.1 79.5 - 101.0 fL   MCH 29.8 25.1 - 34.0 pg   MCHC 31.3 (L) 31.5 - 36.0 g/dL   RBC 4.50 3.70 - 5.45 10e6/uL   RDW 14.4 11.2 - 14.5 %   lymph# 1.9 0.9 - 3.3 10e3/uL   MONO# 0.6 0.1 - 0.9 10e3/uL   Eosinophils Absolute 0.5 0.0 - 0.5 10e3/uL   Basophils Absolute 0.1 0.0 - 0.1 10e3/uL   NEUT% 52.4 38.4 - 76.8 %   LYMPH% 29.6 14.0 - 49.7 %   MONO% 8.7 0.0 - 14.0 %   EOS% 8.4 (H) 0.0 - 7.0 %   BASO% 0.9 0.0 - 2.0 %  Comprehensive metabolic panel  Result Value Ref Range   Sodium 140 136 - 145 mEq/L   Potassium 4.7 3.5 - 5.1 mEq/L   Chloride 105 98 - 109 mEq/L   CO2 24 22 - 29 mEq/L   Glucose 88 70 - 140 mg/dl   BUN 18.0 7.0 - 26.0 mg/dL   Creatinine 1.0 0.6 - 1.1 mg/dL   Total Bilirubin <0.30 0.20 - 1.20 mg/dL   Alkaline Phosphatase 110 40 - 150 U/L   AST 17 5 - 34 U/L   ALT 24 0 - 55 U/L   Total Protein 7.9 6.4 - 8.3 g/dL   Albumin 4.2 3.5 - 5.0 g/dL   Calcium 9.7 8.4 - 10.4 mg/dL   Anion Gap 11 3 - 11 mEq/L   EGFR 60 (L) >90 ml/min/1.73 m2    CA 125 available after visit 51, which may reflect in part the recent surgery. This had been 25 on 09-24-14 and 88 on 01-04-15.  Studies/Results:  No results  found.  Medications: I have reviewed the patient's current medications. Prescriptions to be sent for decadron which will be used initially as 20 mg 12 hrs prior to taxol, zofran and lorazepam.  DISCUSSION I have been in communication with Dr Skeet Latch, who agrees with recommendation for 4 cycles of carbo taxol now, then likely will need restaging scans, at least CT and possibly also PET. We have discussed rationale for chemotherapy in addition to the surgery already done. We have discussed q 3 week vs weekly dose dense carbo taxol; as she had dose dense for adjuvant treatment and did well with that, we have decided to begin with dose dense regimen again. We have discussed carbo test dose, appropriate as  she has had 6 cycles of carboplatin previously. We have discussed premedication steroids, which will start at 20 mg 12 hrs prior to taxol, but will consider decrease to 16 mg prior if tolerates chemo.  We have reviewed risk of infections post splenectomy and she understands that she should contact physician if any significant infectious illness particularly with fever in future.  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 Recurrence with 9 cm mass in splenic hilum at splenectomy 02-02-15, CA 125 elevated at 88 preoperatively. She has recovered adequately from surgery to begin additional 4 cycles of carbo taxol starting 03-16-15, with carbo skin testing. Due to holidays, we may need to adjust timing of "day 8" and "day 15" cycle 1. 2.Pre-splenectomy vaccines given 01-14-15, and flu vaccine 01-19-15 3. Repeat TFTs by PCP ok 4. mammograms up to date and not remarkable 5. Has not had colonoscopy. Referral to GI previously, may need to defer this until after additional chemotherapy 6.slight preexisting peripheral neuropathy fingertips, not  particularly worse with previous taxol. She understands that neuropathy may increase with additional taxol. 7. 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 Chemo orders placed including carbo skin test, managed care notified for preauthorization as we hope to begin on 03-16-15 when availability in infusion. Granix requested, not signed, will begin if needed based on counts. Time spent 40 min including >50% counseling and coordination of care. Patient knows that she can call at any time if concerns prior to scheduled appointments. She is most appreciative of Dr Marlowe Aschoff help and Dr Leone Brand input. CC Drs Skeet Latch, Barry Dienes, Burns   Gordy Levan, MD   03/13/2015, 3:50 PM

## 2015-03-11 NOTE — Telephone Encounter (Signed)
Requested a copy of recent office note for [facility].

## 2015-03-11 NOTE — Telephone Encounter (Signed)
Ms. Yzquierdo is to receive her Taxol treatment on 03-16-15 at 1015 am.  Reviewed when to take 5 tabs of decadron with food 12 hrs prior to Taxol.  Pt to take decadron at 1030 pm on Monday 03-15-15. Told her that the other antiemetics were sent to her pharmacy as well. Ativan and zofran.  Ms. Rohweder verbalized understanding.

## 2015-03-11 NOTE — Telephone Encounter (Signed)
-----   Message from Gordy Levan, MD sent at 03/11/2015  9:09 AM EST ----- Scripts to pharmacy and RN to speak with her about decadron time and nausea meds prior to cycle 1 ~ 12-20  [employer] always fine  Decadron 4 mg   Five tabs with food 12 hrs prior to taxol   #15 for 3 weeks  (may decrease to 16 mg with next cycles so no refills here)  Antiemetics as she used with chemo in 2014 - may have been zofran and ??

## 2015-03-11 NOTE — Telephone Encounter (Signed)
Appointments  Made and  avs printed for patient

## 2015-03-12 LAB — CA 125: CA 125: 51 U/mL — ABNORMAL HIGH (ref <35)

## 2015-03-13 DIAGNOSIS — Z9081 Acquired absence of spleen: Secondary | ICD-10-CM | POA: Insufficient documentation

## 2015-03-15 ENCOUNTER — Telehealth: Payer: Self-pay | Admitting: Oncology

## 2015-03-15 NOTE — Telephone Encounter (Signed)
Appointments made and patient will get a new schedule at 12/20  Chemo

## 2015-03-16 ENCOUNTER — Ambulatory Visit (HOSPITAL_BASED_OUTPATIENT_CLINIC_OR_DEPARTMENT_OTHER): Payer: BLUE CROSS/BLUE SHIELD

## 2015-03-16 VITALS — BP 115/78 | HR 102 | Temp 98.4°F | Resp 18

## 2015-03-16 DIAGNOSIS — Z5111 Encounter for antineoplastic chemotherapy: Secondary | ICD-10-CM | POA: Diagnosis not present

## 2015-03-16 DIAGNOSIS — C569 Malignant neoplasm of unspecified ovary: Secondary | ICD-10-CM | POA: Diagnosis not present

## 2015-03-16 MED ORDER — SODIUM CHLORIDE 0.9 % IV SOLN
Freq: Once | INTRAVENOUS | Status: AC
  Administered 2015-03-16: 10:00:00 via INTRAVENOUS

## 2015-03-16 MED ORDER — CARBOPLATIN CHEMO INTRADERMAL TEST DOSE 100MCG/0.02ML
100.0000 ug | Freq: Once | INTRADERMAL | Status: AC
  Administered 2015-03-16: 100 ug via INTRADERMAL
  Filled 2015-03-16: qty 0.01

## 2015-03-16 MED ORDER — PACLITAXEL CHEMO INJECTION 300 MG/50ML
80.0000 mg/m2 | Freq: Once | INTRAVENOUS | Status: AC
  Administered 2015-03-16: 150 mg via INTRAVENOUS
  Filled 2015-03-16: qty 25

## 2015-03-16 MED ORDER — FAMOTIDINE IN NACL 20-0.9 MG/50ML-% IV SOLN
INTRAVENOUS | Status: AC
  Filled 2015-03-16: qty 50

## 2015-03-16 MED ORDER — SODIUM CHLORIDE 0.9 % IV SOLN
482.5000 mg | Freq: Once | INTRAVENOUS | Status: AC
  Administered 2015-03-16: 480 mg via INTRAVENOUS
  Filled 2015-03-16: qty 48

## 2015-03-16 MED ORDER — DIPHENHYDRAMINE HCL 50 MG/ML IJ SOLN
25.0000 mg | Freq: Once | INTRAMUSCULAR | Status: AC
  Administered 2015-03-16: 25 mg via INTRAVENOUS

## 2015-03-16 MED ORDER — FAMOTIDINE IN NACL 20-0.9 MG/50ML-% IV SOLN
20.0000 mg | Freq: Once | INTRAVENOUS | Status: AC
  Administered 2015-03-16: 20 mg via INTRAVENOUS

## 2015-03-16 MED ORDER — SODIUM CHLORIDE 0.9 % IV SOLN
Freq: Once | INTRAVENOUS | Status: AC
  Administered 2015-03-16: 11:00:00 via INTRAVENOUS
  Filled 2015-03-16: qty 8

## 2015-03-16 MED ORDER — DIPHENHYDRAMINE HCL 50 MG/ML IJ SOLN
INTRAMUSCULAR | Status: AC
  Filled 2015-03-16: qty 1

## 2015-03-16 NOTE — Progress Notes (Signed)
Carbo test dose negative today.

## 2015-03-16 NOTE — Patient Instructions (Signed)
[facility] Discharge Instructions for Patients Receiving Chemotherapy  Today you received the following chemotherapy agents Taxol/Carboplatin  To help prevent nausea and vomiting after your treatment, we encourage you to take your nausea medication    If you develop nausea and vomiting that is not controlled by your nausea medication, call the clinic.   BELOW ARE SYMPTOMS THAT SHOULD BE REPORTED IMMEDIATELY:  *FEVER GREATER THAN 100.5 F  *CHILLS WITH OR WITHOUT FEVER  NAUSEA AND VOMITING THAT IS NOT CONTROLLED WITH YOUR NAUSEA MEDICATION  *UNUSUAL SHORTNESS OF BREATH  *UNUSUAL BRUISING OR BLEEDING  TENDERNESS IN MOUTH AND THROAT WITH OR WITHOUT PRESENCE OF ULCERS  *URINARY PROBLEMS  *BOWEL PROBLEMS  UNUSUAL RASH Items with * indicate a potential emergency and should be followed up as soon as possible.  Feel free to call the clinic you have any questions or concerns. The clinic phone number is [phone].  Please show the CHEMO ALERT CARD at check-in to the Emergency Department and triage nurse.   

## 2015-03-19 ENCOUNTER — Other Ambulatory Visit: Payer: Self-pay | Admitting: *Deleted

## 2015-03-19 DIAGNOSIS — C561 Malignant neoplasm of right ovary: Secondary | ICD-10-CM

## 2015-03-23 ENCOUNTER — Other Ambulatory Visit (HOSPITAL_BASED_OUTPATIENT_CLINIC_OR_DEPARTMENT_OTHER): Payer: BLUE CROSS/BLUE SHIELD

## 2015-03-23 DIAGNOSIS — C561 Malignant neoplasm of right ovary: Secondary | ICD-10-CM | POA: Diagnosis not present

## 2015-03-23 LAB — CBC WITH DIFFERENTIAL/PLATELET
BASO%: 0.9 % (ref 0.0–2.0)
BASOS ABS: 0 10*3/uL (ref 0.0–0.1)
EOS ABS: 0.2 10*3/uL (ref 0.0–0.5)
EOS%: 3.1 % (ref 0.0–7.0)
HCT: 38.9 % (ref 34.8–46.6)
HGB: 12.6 g/dL (ref 11.6–15.9)
LYMPH%: 31.1 % (ref 14.0–49.7)
MCH: 29.5 pg (ref 25.1–34.0)
MCHC: 32.5 g/dL (ref 31.5–36.0)
MCV: 90.8 fL (ref 79.5–101.0)
MONO#: 0.4 10*3/uL (ref 0.1–0.9)
MONO%: 6.7 % (ref 0.0–14.0)
NEUT#: 3.2 10*3/uL (ref 1.5–6.5)
NEUT%: 58.2 % (ref 38.4–76.8)
PLATELETS: 320 10*3/uL (ref 145–400)
RBC: 4.29 10*6/uL (ref 3.70–5.45)
RDW: 14.8 % — ABNORMAL HIGH (ref 11.2–14.5)
WBC: 5.5 10*3/uL (ref 3.9–10.3)
lymph#: 1.7 10*3/uL (ref 0.9–3.3)

## 2015-03-23 LAB — COMPREHENSIVE METABOLIC PANEL
ALK PHOS: 85 U/L (ref 40–150)
ALT: 17 U/L (ref 0–55)
AST: 14 U/L (ref 5–34)
Albumin: 4 g/dL (ref 3.5–5.0)
Anion Gap: 7 mEq/L (ref 3–11)
BUN: 16.7 mg/dL (ref 7.0–26.0)
CO2: 29 meq/L (ref 22–29)
Calcium: 9.6 mg/dL (ref 8.4–10.4)
Chloride: 103 mEq/L (ref 98–109)
Creatinine: 0.8 mg/dL (ref 0.6–1.1)
EGFR: 78 mL/min/{1.73_m2} — AB (ref >90)
GLUCOSE: 93 mg/dL (ref 70–140)
POTASSIUM: 4.6 meq/L (ref 3.5–5.1)
SODIUM: 140 meq/L (ref 136–145)
TOTAL PROTEIN: 7.3 g/dL (ref 6.4–8.3)
Total Bilirubin: 0.6 mg/dL (ref 0.20–1.20)

## 2015-03-24 ENCOUNTER — Ambulatory Visit (HOSPITAL_BASED_OUTPATIENT_CLINIC_OR_DEPARTMENT_OTHER): Payer: BLUE CROSS/BLUE SHIELD

## 2015-03-24 VITALS — BP 123/79 | HR 90 | Temp 98.7°F | Resp 18

## 2015-03-24 DIAGNOSIS — C569 Malignant neoplasm of unspecified ovary: Secondary | ICD-10-CM | POA: Diagnosis not present

## 2015-03-24 DIAGNOSIS — Z5111 Encounter for antineoplastic chemotherapy: Secondary | ICD-10-CM

## 2015-03-24 MED ORDER — PACLITAXEL CHEMO INJECTION 300 MG/50ML
80.0000 mg/m2 | Freq: Once | INTRAVENOUS | Status: AC
  Administered 2015-03-24: 150 mg via INTRAVENOUS
  Filled 2015-03-24: qty 25

## 2015-03-24 MED ORDER — DIPHENHYDRAMINE HCL 50 MG/ML IJ SOLN
INTRAMUSCULAR | Status: AC
Start: 2015-03-24 — End: 2015-03-24
  Filled 2015-03-24: qty 1

## 2015-03-24 MED ORDER — DIPHENHYDRAMINE HCL 50 MG/ML IJ SOLN
25.0000 mg | Freq: Once | INTRAMUSCULAR | Status: AC
  Administered 2015-03-24: 25 mg via INTRAVENOUS

## 2015-03-24 MED ORDER — FAMOTIDINE IN NACL 20-0.9 MG/50ML-% IV SOLN
INTRAVENOUS | Status: AC
  Filled 2015-03-24: qty 50

## 2015-03-24 MED ORDER — SODIUM CHLORIDE 0.9 % IV SOLN
Freq: Once | INTRAVENOUS | Status: AC
  Administered 2015-03-24: 16:00:00 via INTRAVENOUS
  Filled 2015-03-24: qty 4

## 2015-03-24 MED ORDER — SODIUM CHLORIDE 0.9 % IV SOLN
Freq: Once | INTRAVENOUS | Status: AC
  Administered 2015-03-24: 15:00:00 via INTRAVENOUS

## 2015-03-24 MED ORDER — FAMOTIDINE IN NACL 20-0.9 MG/50ML-% IV SOLN
20.0000 mg | Freq: Once | INTRAVENOUS | Status: AC
  Administered 2015-03-24: 20 mg via INTRAVENOUS

## 2015-03-24 NOTE — Patient Instructions (Signed)
[facility] Discharge Instructions for Patients Receiving Chemotherapy  Today you received the following chemotherapy agents Taxol   To help prevent nausea and vomiting after your treatment, we encourage you to take your nausea medication as directed.   If you develop nausea and vomiting that is not controlled by your nausea medication, call the clinic.   BELOW ARE SYMPTOMS THAT SHOULD BE REPORTED IMMEDIATELY:  *FEVER GREATER THAN 100.5 F  *CHILLS WITH OR WITHOUT FEVER  NAUSEA AND VOMITING THAT IS NOT CONTROLLED WITH YOUR NAUSEA MEDICATION  *UNUSUAL SHORTNESS OF BREATH  *UNUSUAL BRUISING OR BLEEDING  TENDERNESS IN MOUTH AND THROAT WITH OR WITHOUT PRESENCE OF ULCERS  *URINARY PROBLEMS  *BOWEL PROBLEMS  UNUSUAL RASH Items with * indicate a potential emergency and should be followed up as soon as possible.  Feel free to call the clinic you have any questions or concerns. The clinic phone number is [phone].  Please show the CHEMO ALERT CARD at check-in to the Emergency Department and triage nurse.   

## 2015-03-25 ENCOUNTER — Encounter: Payer: Self-pay | Admitting: Oncology

## 2015-03-25 NOTE — Progress Notes (Signed)
I placed fmla forms on desk for dr. Marko Plume and noted sharepoint.

## 2015-03-26 ENCOUNTER — Other Ambulatory Visit: Payer: Self-pay

## 2015-03-26 ENCOUNTER — Encounter: Payer: Self-pay | Admitting: Oncology

## 2015-03-26 DIAGNOSIS — C561 Malignant neoplasm of right ovary: Secondary | ICD-10-CM

## 2015-03-26 NOTE — Progress Notes (Signed)
I faxed [employer] form [phone] and called and left message I faxed and her copy at front with ms. Wilma for pk up on next visit. sharepoint noted.

## 2015-03-28 ENCOUNTER — Other Ambulatory Visit: Payer: Self-pay | Admitting: Oncology

## 2015-03-28 DIAGNOSIS — C569 Malignant neoplasm of unspecified ovary: Secondary | ICD-10-CM

## 2015-03-30 ENCOUNTER — Telehealth: Payer: Self-pay

## 2015-03-30 ENCOUNTER — Telehealth: Payer: Self-pay | Admitting: Oncology

## 2015-03-30 ENCOUNTER — Encounter: Payer: Self-pay | Admitting: Oncology

## 2015-03-30 ENCOUNTER — Other Ambulatory Visit: Payer: BLUE CROSS/BLUE SHIELD

## 2015-03-30 ENCOUNTER — Ambulatory Visit (HOSPITAL_BASED_OUTPATIENT_CLINIC_OR_DEPARTMENT_OTHER): Payer: BLUE CROSS/BLUE SHIELD | Admitting: Oncology

## 2015-03-30 ENCOUNTER — Other Ambulatory Visit (HOSPITAL_BASED_OUTPATIENT_CLINIC_OR_DEPARTMENT_OTHER): Payer: BLUE CROSS/BLUE SHIELD

## 2015-03-30 VITALS — BP 102/70 | HR 87 | Temp 98.4°F | Resp 18 | Ht 68.0 in | Wt 167.7 lb

## 2015-03-30 DIAGNOSIS — C569 Malignant neoplasm of unspecified ovary: Secondary | ICD-10-CM

## 2015-03-30 DIAGNOSIS — C7889 Secondary malignant neoplasm of other digestive organs: Secondary | ICD-10-CM

## 2015-03-30 DIAGNOSIS — D701 Agranulocytosis secondary to cancer chemotherapy: Secondary | ICD-10-CM | POA: Diagnosis not present

## 2015-03-30 DIAGNOSIS — C561 Malignant neoplasm of right ovary: Secondary | ICD-10-CM | POA: Diagnosis not present

## 2015-03-30 DIAGNOSIS — Z9081 Acquired absence of spleen: Secondary | ICD-10-CM

## 2015-03-30 DIAGNOSIS — T451X5A Adverse effect of antineoplastic and immunosuppressive drugs, initial encounter: Secondary | ICD-10-CM

## 2015-03-30 DIAGNOSIS — R1013 Epigastric pain: Secondary | ICD-10-CM

## 2015-03-30 LAB — CBC WITH DIFFERENTIAL/PLATELET
BASO%: 1.3 % (ref 0.0–2.0)
BASOS ABS: 0.1 10*3/uL (ref 0.0–0.1)
EOS%: 2.9 % (ref 0.0–7.0)
Eosinophils Absolute: 0.1 10*3/uL (ref 0.0–0.5)
HEMATOCRIT: 38.9 % (ref 34.8–46.6)
HGB: 12.7 g/dL (ref 11.6–15.9)
LYMPH%: 41.3 % (ref 14.0–49.7)
MCH: 29.9 pg (ref 25.1–34.0)
MCHC: 32.6 g/dL (ref 31.5–36.0)
MCV: 91.7 fL (ref 79.5–101.0)
MONO#: 0.3 10*3/uL (ref 0.1–0.9)
MONO%: 7.1 % (ref 0.0–14.0)
NEUT#: 2 10*3/uL (ref 1.5–6.5)
NEUT%: 47.4 % (ref 38.4–76.8)
Platelets: 318 10*3/uL (ref 145–400)
RBC: 4.24 10*6/uL (ref 3.70–5.45)
RDW: 15 % — ABNORMAL HIGH (ref 11.2–14.5)
WBC: 4.2 10*3/uL (ref 3.9–10.3)
lymph#: 1.7 10*3/uL (ref 0.9–3.3)

## 2015-03-30 LAB — COMPREHENSIVE METABOLIC PANEL
ALT: 16 U/L (ref 0–55)
AST: 15 U/L (ref 5–34)
Albumin: 4.1 g/dL (ref 3.5–5.0)
Alkaline Phosphatase: 82 U/L (ref 40–150)
Anion Gap: 7 mEq/L (ref 3–11)
BUN: 15.5 mg/dL (ref 7.0–26.0)
CALCIUM: 9.5 mg/dL (ref 8.4–10.4)
CHLORIDE: 104 meq/L (ref 98–109)
CO2: 27 mEq/L (ref 22–29)
Creatinine: 0.8 mg/dL (ref 0.6–1.1)
EGFR: 78 mL/min/{1.73_m2} — ABNORMAL LOW (ref >90)
Glucose: 99 mg/dl (ref 70–140)
POTASSIUM: 4.5 meq/L (ref 3.5–5.1)
SODIUM: 139 meq/L (ref 136–145)
Total Bilirubin: 0.37 mg/dL (ref 0.20–1.20)
Total Protein: 7.4 g/dL (ref 6.4–8.3)

## 2015-03-30 MED ORDER — PANTOPRAZOLE SODIUM 40 MG PO TBEC: 40.0000 mg | DELAYED_RELEASE_TABLET | Freq: Every day | ORAL | Status: DC

## 2015-03-30 NOTE — Telephone Encounter (Signed)
lvm that Rx was e-scribed.

## 2015-03-30 NOTE — Progress Notes (Signed)
OFFICE PROGRESS NOTE   March 31, 2015   Physicians:W.Brewster, M.Wert, V.Leschber/ Billey Gosling , Stark Klein  INTERVAL HISTORY:  Patient is seen, alone for visit, in continuing attention to chemotherapy in process for high grade serous carcinoma of right ovary recurrent with metastatic disease to spleen, post splenectomy 02-02-15 and now on dose dense carbo taxol since 03-16-15. She has not had granix thus far, tho needed this weekly with same regimen in 2014.  Patient is tolerating chemotherapy generally well, due day 15 cycle 1 on 03-31-15. She has had minimal nausea, tho experiences some epigastric burning discomfort which improves when she eats. Bowels are not moving well, and she will add colace and/ or miralax or other laxative as needed to keep them moving daily. She has been drinking fluids. She denies fever, any symptoms of infection, bleeding, abdominal pain, SOB, peripheral neuropathy, LE swelling, bladder symptoms. Remainder of 10 point Review of Systems negative.   Flu vaccine 01-19-15 No PAC. All of previous chemo done peripheral Genetics testing normal 08-2013 ([facility]Next panel) Pneumovax, meningococcal vaccine and Hemophilus influenza all 01-14-15 Splenectomy 02-02-15 CA 125 was 2418 at presentation 03-2012 and 88 on 01-04-15 prior to splenectomy  ONCOLOGIC HISTORY Patient was in usual excellent health thru Sept 2013,then in Oct 2013 more fatigued, with some fecal urge incontinence and abdominal fullness such that she began to diet to lose weight. By Marcene Duos she could not climb one flight of stairs and could walk only from office to car without SOB and coughing. She was seen at urgent care in Dec with bilateral pleural effusions on CXR, referred to Dr Clois Comber, with 1.6 liter right thoracentesis done 03-11-12 (no path in this EMR). She had CT CAP and CT angio chest with large bilateral pleural effusions, no PE, ascites and pelvic mass. She had paracentesis for 1.4 liters on 04-02-12 and  left thoracentesis for 2 liters on 04-05-12. CA 125 was 2418. She was seen in consultation by Dr Skeet Latch, with TAH/BSO, radical debulking and omentectomy on 04-09-2012, which was optimal debulking with only residual at completion of procedure being 7 mm area on posterior surface of right lobe liver. She was transfused 2 units PRBCs on 04-10-12 for Hgb down to 7.2 (from 12 preop). Pathology [id number]) high grade serous carcinoma involving right pelvic mass, serosa of uterus and peritoneum, omentum negative, no nodes submitted, felt IIIC right ovarian primary. Adjuvant therapy with dose dense taxol/ carboplatin began 05-03-12, with CA125 down to 30 by early April. Cycle 6 was completed 09-10-12. CA 125 remained in good range, including 14 in 03-2009 and 25 on 09-24-14, however was 88 in early Oct 2016. CT CAP 01-11-15 found mass at anterior aspect of spleen 11.5 x 8.2 x 13 cm, with stable unremarkable chest, no ascites, no adenopathy, no pelvic masses. She had splenectomy by Dr Barry Dienes 02-02-15, pathology [id number]) high grade serous carcinoma involving splenic capsule and parenchyma, 9 cm. There was no visualized abdominal carcinomatosis; tail of pancreas was adherent to mass and taken with spleen.    Objective:  Vital signs in last 24 hours:  BP 102/70 mmHg  Pulse 87  Temp(Src) 98.4 F (36.9 C) (Oral)  Resp 18  Ht 5' 8" (1.727 m)  Wt 167 lb 11.2 oz (76.068 kg)  BMI 25.50 kg/m2  SpO2 100% Weight up 1 lb Alert, oriented and appropriate. Ambulatory without difficulty.  No alopecia  HEENT:PERRL, sclerae not icteric. Oral mucosa moist without lesions, posterior pharynx clear.  Neck supple. No JVD.  Lymphatics:no cervical,supraclavicular,  axillary or inguinal adenopathy Resp: clear to auscultation bilaterally and normal percussion bilaterally Cardio: regular rate and rhythm. No gallop. GI: soft, minimally tender upper mid abdomen, not distended, no mass or organomegaly. Normally active bowel sounds.  Surgical incisions not remarkable including LUQ. Musculoskeletal/ Extremities: without pitting edema, cords, tenderness Neuro: no peripheral neuropathy. Otherwise nonfocal. PSYCH appropriate mood and affect Skin without rash, ecchymosis, petechiae  Lab Results:  Results for orders placed or performed in visit on 03/30/15  CBC with Differential  Result Value Ref Range   WBC 4.2 3.9 - 10.3 10e3/uL   NEUT# 2.0 1.5 - 6.5 10e3/uL   HGB 12.7 11.6 - 15.9 g/dL   HCT 38.9 34.8 - 46.6 %   Platelets 318 145 - 400 10e3/uL   MCV 91.7 79.5 - 101.0 fL   MCH 29.9 25.1 - 34.0 pg   MCHC 32.6 31.5 - 36.0 g/dL   RBC 4.24 3.70 - 5.45 10e6/uL   RDW 15.0 (H) 11.2 - 14.5 %   lymph# 1.7 0.9 - 3.3 10e3/uL   MONO# 0.3 0.1 - 0.9 10e3/uL   Eosinophils Absolute 0.1 0.0 - 0.5 10e3/uL   Basophils Absolute 0.1 0.0 - 0.1 10e3/uL   NEUT% 47.4 38.4 - 76.8 %   LYMPH% 41.3 14.0 - 49.7 %   MONO% 7.1 0.0 - 14.0 %   EOS% 2.9 0.0 - 7.0 %   BASO% 1.3 0.0 - 2.0 %  Comprehensive metabolic panel  Result Value Ref Range   Sodium 139 136 - 145 mEq/L   Potassium 4.5 3.5 - 5.1 mEq/L   Chloride 104 98 - 109 mEq/L   CO2 27 22 - 29 mEq/L   Glucose 99 70 - 140 mg/dl   BUN 15.5 7.0 - 26.0 mg/dL   Creatinine 0.8 0.6 - 1.1 mg/dL   Total Bilirubin 0.37 0.20 - 1.20 mg/dL   Alkaline Phosphatase 82 40 - 150 U/L   AST 15 5 - 34 U/L   ALT 16 0 - 55 U/L   Total Protein 7.4 6.4 - 8.3 g/dL   Albumin 4.1 3.5 - 5.0 g/dL   Calcium 9.5 8.4 - 10.4 mg/dL   Anion Gap 7 3 - 11 mEq/L   EGFR 78 (L) >90 ml/min/1.73 m2   CA 125 was 51 on 03-11-15, which may still have reflected recent surgery, will repeat with labs 04-12-15  Studies/Results:   She did not have PET prior to splenectomy, last imaging was CT CAP on 01-11-15  Medications: I have reviewed the patient's current medications.  Will add granix day 2 cycle 2, then additional from there is needed. Will decrease premed decadron to 16 mg 12 hrs prior to taxol. Add protonix 40 mg daily    DISCUSSION Interval history reviewed; patient is pleased with how well she is tolerating chemotherapy and in agreement with continuing as planned.  Medications reviewed as above She is to see Dr Brewster on 04-29-15.    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 Recurrence with 9 cm mass in splenic hilum at splenectomy 02-02-15, CA 125 elevated at 88 preoperatively. Plan 4 cycles dose dense carbo taxol, then expect will need PET CT. She will have day 15 cycle 1 on 1-4, then day 1 cycle 2 on 1-11 as long as ANC >=1.5 and plt >=100k, with granix on 1-12 due to chemo leukopenia. Will recheck CA 125 with   labs 1-16.  2.Post splenectomy: vaccines given 12-2014. She knows to be in touch with MD immediately if fever/ infectious illness, as low threshold for antibiotics  3. Repeat TFTs by PCP ok 4. mammograms up to date and not remarkable 5. Has not had colonoscopy. Referral to GI previously, may need to defer this until after additional chemotherapy 6.slight preexisting peripheral neuropathy fingertips, not particularly worse with previous taxol. She understands that neuropathy may increase with additional taxol. 7. 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 Chemo orders confirmed, granix added. Patient knows to call if any concerns prior to next scheduled appointments. Time spent 25 min including >50% counseling and coordination of care.Cc Dr Skeet Latch as update.   LIVESAY,LENNIS P, MD   03/31/2015, 2:14 PM

## 2015-03-30 NOTE — Telephone Encounter (Signed)
Appointments made and avs printed for patient °

## 2015-03-30 NOTE — Telephone Encounter (Signed)
-----   Message from Gordy Levan, MD sent at 03/30/2015  9:39 AM EST ----- Protonix 40 mg daily #30  2 RF for stomach burning

## 2015-03-31 ENCOUNTER — Ambulatory Visit (HOSPITAL_BASED_OUTPATIENT_CLINIC_OR_DEPARTMENT_OTHER): Payer: BLUE CROSS/BLUE SHIELD

## 2015-03-31 VITALS — BP 117/60 | HR 100 | Temp 98.0°F | Resp 18 | Ht 68.0 in

## 2015-03-31 DIAGNOSIS — D701 Agranulocytosis secondary to cancer chemotherapy: Secondary | ICD-10-CM | POA: Insufficient documentation

## 2015-03-31 DIAGNOSIS — Z5111 Encounter for antineoplastic chemotherapy: Secondary | ICD-10-CM

## 2015-03-31 DIAGNOSIS — C569 Malignant neoplasm of unspecified ovary: Secondary | ICD-10-CM

## 2015-03-31 DIAGNOSIS — R1013 Epigastric pain: Secondary | ICD-10-CM | POA: Insufficient documentation

## 2015-03-31 DIAGNOSIS — T451X5A Adverse effect of antineoplastic and immunosuppressive drugs, initial encounter: Secondary | ICD-10-CM

## 2015-03-31 MED ORDER — SODIUM CHLORIDE 0.9 % IV SOLN
Freq: Once | INTRAVENOUS | Status: AC
  Administered 2015-03-31: 16:00:00 via INTRAVENOUS
  Filled 2015-03-31: qty 4

## 2015-03-31 MED ORDER — FAMOTIDINE IN NACL 20-0.9 MG/50ML-% IV SOLN
INTRAVENOUS | Status: AC
  Filled 2015-03-31: qty 50

## 2015-03-31 MED ORDER — SODIUM CHLORIDE 0.9 % IV SOLN
Freq: Once | INTRAVENOUS | Status: AC
  Administered 2015-03-31: 16:00:00 via INTRAVENOUS

## 2015-03-31 MED ORDER — FAMOTIDINE IN NACL 20-0.9 MG/50ML-% IV SOLN
20.0000 mg | Freq: Once | INTRAVENOUS | Status: AC
  Administered 2015-03-31: 20 mg via INTRAVENOUS

## 2015-03-31 MED ORDER — DIPHENHYDRAMINE HCL 50 MG/ML IJ SOLN
INTRAMUSCULAR | Status: AC
  Filled 2015-03-31: qty 1

## 2015-03-31 MED ORDER — DIPHENHYDRAMINE HCL 50 MG/ML IJ SOLN
25.0000 mg | Freq: Once | INTRAMUSCULAR | Status: AC
  Administered 2015-03-31: 25 mg via INTRAVENOUS

## 2015-03-31 MED ORDER — PACLITAXEL CHEMO INJECTION 300 MG/50ML
80.0000 mg/m2 | Freq: Once | INTRAVENOUS | Status: AC
  Administered 2015-03-31: 150 mg via INTRAVENOUS
  Filled 2015-03-31: qty 25

## 2015-03-31 NOTE — Patient Instructions (Signed)
[facility] Discharge Instructions for Patients Receiving Chemotherapy  Today you received the following chemotherapy agents Taxol   To help prevent nausea and vomiting after your treatment, we encourage you to take your nausea medication as directed.   If you develop nausea and vomiting that is not controlled by your nausea medication, call the clinic.   BELOW ARE SYMPTOMS THAT SHOULD BE REPORTED IMMEDIATELY:  *FEVER GREATER THAN 100.5 F  *CHILLS WITH OR WITHOUT FEVER  NAUSEA AND VOMITING THAT IS NOT CONTROLLED WITH YOUR NAUSEA MEDICATION  *UNUSUAL SHORTNESS OF BREATH  *UNUSUAL BRUISING OR BLEEDING  TENDERNESS IN MOUTH AND THROAT WITH OR WITHOUT PRESENCE OF ULCERS  *URINARY PROBLEMS  *BOWEL PROBLEMS  UNUSUAL RASH Items with * indicate a potential emergency and should be followed up as soon as possible.  Feel free to call the clinic you have any questions or concerns. The clinic phone number is [phone].  Please show the CHEMO ALERT CARD at check-in to the Emergency Department and triage nurse.   

## 2015-04-01 ENCOUNTER — Encounter: Payer: Self-pay | Admitting: Oncology

## 2015-04-01 NOTE — Progress Notes (Signed)
Patient left a message to resend forms. See prev sent 03/26/15.

## 2015-04-07 ENCOUNTER — Ambulatory Visit: Payer: BLUE CROSS/BLUE SHIELD

## 2015-04-07 ENCOUNTER — Other Ambulatory Visit: Payer: Self-pay | Admitting: *Deleted

## 2015-04-07 ENCOUNTER — Other Ambulatory Visit (HOSPITAL_BASED_OUTPATIENT_CLINIC_OR_DEPARTMENT_OTHER): Payer: BLUE CROSS/BLUE SHIELD

## 2015-04-07 DIAGNOSIS — C569 Malignant neoplasm of unspecified ovary: Secondary | ICD-10-CM

## 2015-04-07 LAB — CBC WITH DIFFERENTIAL/PLATELET
BASO%: 2.1 % — AB (ref 0.0–2.0)
Basophils Absolute: 0.1 10*3/uL (ref 0.0–0.1)
EOS%: 1.4 % (ref 0.0–7.0)
Eosinophils Absolute: 0 10*3/uL (ref 0.0–0.5)
HCT: 40.8 % (ref 34.8–46.6)
HGB: 13.3 g/dL (ref 11.6–15.9)
LYMPH%: 51.2 % — AB (ref 14.0–49.7)
MCH: 29.8 pg (ref 25.1–34.0)
MCHC: 32.5 g/dL (ref 31.5–36.0)
MCV: 91.8 fL (ref 79.5–101.0)
MONO#: 0.3 10*3/uL (ref 0.1–0.9)
MONO%: 8.5 % (ref 0.0–14.0)
NEUT#: 1.3 10*3/uL — ABNORMAL LOW (ref 1.5–6.5)
NEUT%: 36.8 % — AB (ref 38.4–76.8)
Platelets: 271 10*3/uL (ref 145–400)
RBC: 4.45 10*6/uL (ref 3.70–5.45)
RDW: 15.7 % — ABNORMAL HIGH (ref 11.2–14.5)
WBC: 3.5 10*3/uL — ABNORMAL LOW (ref 3.9–10.3)
lymph#: 1.8 10*3/uL (ref 0.9–3.3)

## 2015-04-07 LAB — COMPREHENSIVE METABOLIC PANEL
ALT: 16 U/L (ref 0–55)
AST: 17 U/L (ref 5–34)
Albumin: 4.3 g/dL (ref 3.5–5.0)
Alkaline Phosphatase: 78 U/L (ref 40–150)
Anion Gap: 10 mEq/L (ref 3–11)
BUN: 16.9 mg/dL (ref 7.0–26.0)
CHLORIDE: 103 meq/L (ref 98–109)
CO2: 25 meq/L (ref 22–29)
CREATININE: 0.8 mg/dL (ref 0.6–1.1)
Calcium: 9.7 mg/dL (ref 8.4–10.4)
EGFR: 80 mL/min/{1.73_m2} — ABNORMAL LOW (ref >90)
GLUCOSE: 94 mg/dL (ref 70–140)
Potassium: 4 mEq/L (ref 3.5–5.1)
SODIUM: 138 meq/L (ref 136–145)
Total Bilirubin: 0.36 mg/dL (ref 0.20–1.20)
Total Protein: 7.7 g/dL (ref 6.4–8.3)

## 2015-04-07 MED ORDER — DEXAMETHASONE 4 MG PO TABS: ORAL_TABLET | ORAL | Status: DC

## 2015-04-07 NOTE — Progress Notes (Signed)
Pt was not expecting to take chemo today. Was upset, thinking she was for injection only. Reviewed treatment plan with her and explained schedule. Pt was concerned that she had not taken steroids at home as pre med- afraid she would not tolerated chemo as well without them. Asked patient if she would feel better if we reschedule treatment for Thursday and injection for Friday. Will come for labs prior to treatment. Calendar given to patient. Decadron called into pharmacy per Dr [name] last office note indicating a change to 16 mg 12 hours prior to chemo. Pt verbalized understanding

## 2015-04-08 ENCOUNTER — Ambulatory Visit: Payer: BLUE CROSS/BLUE SHIELD

## 2015-04-08 ENCOUNTER — Ambulatory Visit (HOSPITAL_BASED_OUTPATIENT_CLINIC_OR_DEPARTMENT_OTHER): Payer: BLUE CROSS/BLUE SHIELD

## 2015-04-08 ENCOUNTER — Ambulatory Visit: Payer: BLUE CROSS/BLUE SHIELD | Admitting: Gynecologic Oncology

## 2015-04-08 VITALS — BP 116/63 | HR 99 | Temp 97.9°F | Resp 16

## 2015-04-08 DIAGNOSIS — C569 Malignant neoplasm of unspecified ovary: Secondary | ICD-10-CM

## 2015-04-08 DIAGNOSIS — Z5111 Encounter for antineoplastic chemotherapy: Secondary | ICD-10-CM | POA: Diagnosis not present

## 2015-04-08 LAB — CBC WITH DIFFERENTIAL/PLATELET
BASO%: 0.3 % (ref 0.0–2.0)
Basophils Absolute: 0 10*3/uL (ref 0.0–0.1)
EOS ABS: 0 10*3/uL (ref 0.0–0.5)
EOS%: 0 % (ref 0.0–7.0)
HEMATOCRIT: 40.7 % (ref 34.8–46.6)
HEMOGLOBIN: 13.2 g/dL (ref 11.6–15.9)
LYMPH#: 0.5 10*3/uL — AB (ref 0.9–3.3)
LYMPH%: 15.2 % (ref 14.0–49.7)
MCH: 30 pg (ref 25.1–34.0)
MCHC: 32.5 g/dL (ref 31.5–36.0)
MCV: 92.4 fL (ref 79.5–101.0)
MONO#: 0 10*3/uL — AB (ref 0.1–0.9)
MONO%: 0.9 % (ref 0.0–14.0)
NEUT%: 83.6 % — ABNORMAL HIGH (ref 38.4–76.8)
NEUTROS ABS: 2.7 10*3/uL (ref 1.5–6.5)
PLATELETS: 268 10*3/uL (ref 145–400)
RBC: 4.41 10*6/uL (ref 3.70–5.45)
RDW: 15.8 % — AB (ref 11.2–14.5)
WBC: 3.2 10*3/uL — AB (ref 3.9–10.3)

## 2015-04-08 LAB — COMPREHENSIVE METABOLIC PANEL
ALT: 17 U/L (ref 0–55)
AST: 16 U/L (ref 5–34)
Albumin: 4.1 g/dL (ref 3.5–5.0)
Alkaline Phosphatase: 81 U/L (ref 40–150)
Anion Gap: 10 mEq/L (ref 3–11)
BUN: 13.9 mg/dL (ref 7.0–26.0)
CALCIUM: 9.7 mg/dL (ref 8.4–10.4)
CHLORIDE: 104 meq/L (ref 98–109)
CO2: 24 mEq/L (ref 22–29)
Creatinine: 0.8 mg/dL (ref 0.6–1.1)
EGFR: 84 mL/min/{1.73_m2} — ABNORMAL LOW (ref >90)
Glucose: 143 mg/dl — ABNORMAL HIGH (ref 70–140)
POTASSIUM: 4.7 meq/L (ref 3.5–5.1)
Sodium: 138 mEq/L (ref 136–145)
Total Bilirubin: <0.3 mg/dL (ref 0.20–1.20)
Total Protein: 7.8 g/dL (ref 6.4–8.3)

## 2015-04-08 MED ORDER — DIPHENHYDRAMINE HCL 50 MG/ML IJ SOLN
INTRAMUSCULAR | Status: AC
  Filled 2015-04-08: qty 1

## 2015-04-08 MED ORDER — PACLITAXEL CHEMO INJECTION 300 MG/50ML
80.0000 mg/m2 | Freq: Once | INTRAVENOUS | Status: AC
  Administered 2015-04-08: 150 mg via INTRAVENOUS
  Filled 2015-04-08: qty 25

## 2015-04-08 MED ORDER — CARBOPLATIN CHEMO INTRADERMAL TEST DOSE 100MCG/0.02ML
100.0000 ug | Freq: Once | INTRADERMAL | Status: AC
  Administered 2015-04-08: 100 ug via INTRADERMAL
  Filled 2015-04-08: qty 0.02

## 2015-04-08 MED ORDER — FAMOTIDINE IN NACL 20-0.9 MG/50ML-% IV SOLN
20.0000 mg | Freq: Once | INTRAVENOUS | Status: AC
  Administered 2015-04-08: 20 mg via INTRAVENOUS

## 2015-04-08 MED ORDER — SODIUM CHLORIDE 0.9 % IV SOLN
Freq: Once | INTRAVENOUS | Status: AC
  Administered 2015-04-08: 13:00:00 via INTRAVENOUS

## 2015-04-08 MED ORDER — SODIUM CHLORIDE 0.9 % IV SOLN
Freq: Once | INTRAVENOUS | Status: AC
  Administered 2015-04-08: 14:00:00 via INTRAVENOUS
  Filled 2015-04-08: qty 8

## 2015-04-08 MED ORDER — SODIUM CHLORIDE 0.9 % IV SOLN
572.0000 mg | Freq: Once | INTRAVENOUS | Status: AC
  Administered 2015-04-08: 570 mg via INTRAVENOUS
  Filled 2015-04-08: qty 57

## 2015-04-08 MED ORDER — DIPHENHYDRAMINE HCL 50 MG/ML IJ SOLN
25.0000 mg | Freq: Once | INTRAMUSCULAR | Status: AC
  Administered 2015-04-08: 25 mg via INTRAVENOUS

## 2015-04-08 MED ORDER — FAMOTIDINE IN NACL 20-0.9 MG/50ML-% IV SOLN
INTRAVENOUS | Status: AC
  Filled 2015-04-08: qty 50

## 2015-04-08 NOTE — Patient Instructions (Signed)
[facility] Discharge Instructions for Patients Receiving Chemotherapy  Today you received the following chemotherapy agents taxol/carboplatin  To help prevent nausea and vomiting after your treatment, we encourage you to take your nausea medication as directed   If you develop nausea and vomiting that is not controlled by your nausea medication, call the clinic.   BELOW ARE SYMPTOMS THAT SHOULD BE REPORTED IMMEDIATELY:  *FEVER GREATER THAN 100.5 F  *CHILLS WITH OR WITHOUT FEVER  NAUSEA AND VOMITING THAT IS NOT CONTROLLED WITH YOUR NAUSEA MEDICATION  *UNUSUAL SHORTNESS OF BREATH  *UNUSUAL BRUISING OR BLEEDING  TENDERNESS IN MOUTH AND THROAT WITH OR WITHOUT PRESENCE OF ULCERS  *URINARY PROBLEMS  *BOWEL PROBLEMS  UNUSUAL RASH Items with * indicate a potential emergency and should be followed up as soon as possible.  Feel free to call the clinic you have any questions or concerns. The clinic phone number is [phone].  

## 2015-04-09 ENCOUNTER — Ambulatory Visit (HOSPITAL_BASED_OUTPATIENT_CLINIC_OR_DEPARTMENT_OTHER): Payer: BLUE CROSS/BLUE SHIELD

## 2015-04-09 VITALS — BP 96/80 | HR 70 | Temp 98.3°F

## 2015-04-09 DIAGNOSIS — Z5189 Encounter for other specified aftercare: Secondary | ICD-10-CM

## 2015-04-09 DIAGNOSIS — C569 Malignant neoplasm of unspecified ovary: Secondary | ICD-10-CM | POA: Diagnosis not present

## 2015-04-09 MED ORDER — TBO-FILGRASTIM 300 MCG/0.5ML ~~LOC~~ SOSY
300.0000 ug | PREFILLED_SYRINGE | Freq: Once | SUBCUTANEOUS | Status: AC
  Administered 2015-04-09: 300 ug via SUBCUTANEOUS
  Filled 2015-04-09: qty 0.5

## 2015-04-11 ENCOUNTER — Other Ambulatory Visit: Payer: Self-pay | Admitting: Oncology

## 2015-04-12 ENCOUNTER — Other Ambulatory Visit: Payer: Self-pay

## 2015-04-12 ENCOUNTER — Other Ambulatory Visit: Payer: BLUE CROSS/BLUE SHIELD

## 2015-04-12 ENCOUNTER — Ambulatory Visit: Payer: BLUE CROSS/BLUE SHIELD | Admitting: Oncology

## 2015-04-12 DIAGNOSIS — C561 Malignant neoplasm of right ovary: Secondary | ICD-10-CM

## 2015-04-12 MED ORDER — DEXAMETHASONE 4 MG PO TABS: ORAL_TABLET | ORAL | Status: DC

## 2015-04-13 MED ORDER — DEXAMETHASONE 4 MG PO TABS: ORAL_TABLET | ORAL | Status: DC

## 2015-04-13 NOTE — Telephone Encounter (Signed)
Spoke with [facility].  The prescription for 12 tabs of decadron is the one that should be filled not the one sent for #20 tabs yesterday.  Pharmacist will cancel  the # 20 tab prescription and fill the #12 tab just E-prescribed.

## 2015-04-13 NOTE — Telephone Encounter (Signed)
"  I have a chemotherapy appointment tomorrow.  I only have one steroid pill and am supposed to take four.  I need a refill."  Informed her a refill was sent yesterday.  No further questions.

## 2015-04-13 NOTE — Telephone Encounter (Signed)
Left a message for Shelley Sanchez that she missed her appointment with Dr. Marko Plume yesterday.  Will treat her tomorrow if labs fine and she is not experiencing any issues from the treatments. Requested that she call back today if she is experiencing any probl;ems.  Keep treatment appointment 04-21-15 as scheduled and then next visit with Dr. Marko Plume is 04-26-15 @ 0930.

## 2015-04-14 ENCOUNTER — Ambulatory Visit (HOSPITAL_BASED_OUTPATIENT_CLINIC_OR_DEPARTMENT_OTHER): Payer: BLUE CROSS/BLUE SHIELD

## 2015-04-14 ENCOUNTER — Other Ambulatory Visit: Payer: Self-pay | Admitting: Oncology

## 2015-04-14 ENCOUNTER — Other Ambulatory Visit (HOSPITAL_BASED_OUTPATIENT_CLINIC_OR_DEPARTMENT_OTHER): Payer: BLUE CROSS/BLUE SHIELD

## 2015-04-14 VITALS — BP 115/64 | HR 112 | Temp 98.2°F | Resp 16

## 2015-04-14 DIAGNOSIS — C569 Malignant neoplasm of unspecified ovary: Secondary | ICD-10-CM | POA: Diagnosis not present

## 2015-04-14 DIAGNOSIS — Z5111 Encounter for antineoplastic chemotherapy: Secondary | ICD-10-CM

## 2015-04-14 LAB — CBC WITH DIFFERENTIAL/PLATELET
BASO%: 0 % (ref 0.0–2.0)
BASOS ABS: 0 10*3/uL (ref 0.0–0.1)
EOS ABS: 0 10*3/uL (ref 0.0–0.5)
EOS%: 0 % (ref 0.0–7.0)
HCT: 37.9 % (ref 34.8–46.6)
HGB: 12.6 g/dL (ref 11.6–15.9)
LYMPH%: 19.6 % (ref 14.0–49.7)
MCH: 30.3 pg (ref 25.1–34.0)
MCHC: 33.2 g/dL (ref 31.5–36.0)
MCV: 91.1 fL (ref 79.5–101.0)
MONO#: 0 10*3/uL — AB (ref 0.1–0.9)
MONO%: 0.6 % (ref 0.0–14.0)
NEUT#: 2.7 10*3/uL (ref 1.5–6.5)
NEUT%: 79.8 % — AB (ref 38.4–76.8)
PLATELETS: 271 10*3/uL (ref 145–400)
RBC: 4.16 10*6/uL (ref 3.70–5.45)
RDW: 15.3 % — ABNORMAL HIGH (ref 11.2–14.5)
WBC: 3.3 10*3/uL — ABNORMAL LOW (ref 3.9–10.3)
lymph#: 0.7 10*3/uL — ABNORMAL LOW (ref 0.9–3.3)

## 2015-04-14 LAB — COMPREHENSIVE METABOLIC PANEL
ALT: 17 U/L (ref 0–55)
ANION GAP: 8 meq/L (ref 3–11)
AST: 13 U/L (ref 5–34)
Albumin: 3.8 g/dL (ref 3.5–5.0)
Alkaline Phosphatase: 83 U/L (ref 40–150)
BUN: 14.8 mg/dL (ref 7.0–26.0)
CHLORIDE: 107 meq/L (ref 98–109)
CO2: 22 meq/L (ref 22–29)
CREATININE: 0.8 mg/dL (ref 0.6–1.1)
Calcium: 9.4 mg/dL (ref 8.4–10.4)
EGFR: 76 mL/min/{1.73_m2} — AB (ref >90)
Glucose: 192 mg/dl — ABNORMAL HIGH (ref 70–140)
Potassium: 4.5 mEq/L (ref 3.5–5.1)
Sodium: 137 mEq/L (ref 136–145)
TOTAL PROTEIN: 7.2 g/dL (ref 6.4–8.3)

## 2015-04-14 MED ORDER — FAMOTIDINE IN NACL 20-0.9 MG/50ML-% IV SOLN
INTRAVENOUS | Status: AC
  Filled 2015-04-14: qty 50

## 2015-04-14 MED ORDER — DIPHENHYDRAMINE HCL 50 MG/ML IJ SOLN
INTRAMUSCULAR | Status: AC
  Filled 2015-04-14: qty 1

## 2015-04-14 MED ORDER — FAMOTIDINE IN NACL 20-0.9 MG/50ML-% IV SOLN
20.0000 mg | Freq: Once | INTRAVENOUS | Status: AC
  Administered 2015-04-14: 20 mg via INTRAVENOUS

## 2015-04-14 MED ORDER — DIPHENHYDRAMINE HCL 50 MG/ML IJ SOLN
25.0000 mg | Freq: Once | INTRAMUSCULAR | Status: AC
  Administered 2015-04-14: 25 mg via INTRAVENOUS

## 2015-04-14 MED ORDER — SODIUM CHLORIDE 0.9 % IV SOLN
Freq: Once | INTRAVENOUS | Status: AC
  Administered 2015-04-14: 15:00:00 via INTRAVENOUS

## 2015-04-14 MED ORDER — PACLITAXEL CHEMO INJECTION 300 MG/50ML
80.0000 mg/m2 | Freq: Once | INTRAVENOUS | Status: AC
  Administered 2015-04-14: 150 mg via INTRAVENOUS
  Filled 2015-04-14: qty 25

## 2015-04-14 MED ORDER — SODIUM CHLORIDE 0.9 % IV SOLN
Freq: Once | INTRAVENOUS | Status: AC
  Administered 2015-04-14: 16:00:00 via INTRAVENOUS
  Filled 2015-04-14: qty 4

## 2015-04-15 ENCOUNTER — Telehealth: Payer: Self-pay | Admitting: Oncology

## 2015-04-15 LAB — CANCER ANTIGEN 125 (PARALLEL TESTING): CA 125: 34 U/mL (ref <35)

## 2015-04-15 LAB — CA 125: CANCER ANTIGEN (CA) 125: 27.9 U/mL (ref 0.0–38.1)

## 2015-04-15 NOTE — Telephone Encounter (Signed)
Appointments made and left a message with them

## 2015-04-18 ENCOUNTER — Other Ambulatory Visit: Payer: Self-pay | Admitting: Oncology

## 2015-04-19 ENCOUNTER — Telehealth: Payer: Self-pay | Admitting: Oncology

## 2015-04-19 ENCOUNTER — Ambulatory Visit (HOSPITAL_BASED_OUTPATIENT_CLINIC_OR_DEPARTMENT_OTHER): Payer: BLUE CROSS/BLUE SHIELD | Admitting: Oncology

## 2015-04-19 ENCOUNTER — Telehealth: Payer: Self-pay | Admitting: *Deleted

## 2015-04-19 ENCOUNTER — Other Ambulatory Visit (HOSPITAL_BASED_OUTPATIENT_CLINIC_OR_DEPARTMENT_OTHER): Payer: BLUE CROSS/BLUE SHIELD

## 2015-04-19 ENCOUNTER — Encounter: Payer: Self-pay | Admitting: Oncology

## 2015-04-19 VITALS — BP 111/63 | HR 101 | Temp 98.1°F | Resp 18 | Ht 68.0 in | Wt 166.1 lb

## 2015-04-19 DIAGNOSIS — C569 Malignant neoplasm of unspecified ovary: Secondary | ICD-10-CM | POA: Diagnosis not present

## 2015-04-19 DIAGNOSIS — Z9081 Acquired absence of spleen: Secondary | ICD-10-CM | POA: Diagnosis not present

## 2015-04-19 DIAGNOSIS — D701 Agranulocytosis secondary to cancer chemotherapy: Secondary | ICD-10-CM

## 2015-04-19 DIAGNOSIS — C7889 Secondary malignant neoplasm of other digestive organs: Secondary | ICD-10-CM

## 2015-04-19 DIAGNOSIS — T451X5A Adverse effect of antineoplastic and immunosuppressive drugs, initial encounter: Secondary | ICD-10-CM

## 2015-04-19 LAB — COMPREHENSIVE METABOLIC PANEL
ALT: 20 U/L (ref 0–55)
AST: 17 U/L (ref 5–34)
Albumin: 3.9 g/dL (ref 3.5–5.0)
Alkaline Phosphatase: 76 U/L (ref 40–150)
Anion Gap: 8 mEq/L (ref 3–11)
BILIRUBIN TOTAL: 0.41 mg/dL (ref 0.20–1.20)
BUN: 14.3 mg/dL (ref 7.0–26.0)
CO2: 27 meq/L (ref 22–29)
CREATININE: 0.8 mg/dL (ref 0.6–1.1)
Calcium: 9.4 mg/dL (ref 8.4–10.4)
Chloride: 102 mEq/L (ref 98–109)
EGFR: 84 mL/min/{1.73_m2} — AB (ref >90)
GLUCOSE: 101 mg/dL (ref 70–140)
Potassium: 4.1 mEq/L (ref 3.5–5.1)
SODIUM: 137 meq/L (ref 136–145)
TOTAL PROTEIN: 7 g/dL (ref 6.4–8.3)

## 2015-04-19 LAB — CBC WITH DIFFERENTIAL/PLATELET
BASO%: 0.6 % (ref 0.0–2.0)
Basophils Absolute: 0 10*3/uL (ref 0.0–0.1)
EOS%: 0.4 % (ref 0.0–7.0)
Eosinophils Absolute: 0 10*3/uL (ref 0.0–0.5)
HCT: 37.9 % (ref 34.8–46.6)
HGB: 12.4 g/dL (ref 11.6–15.9)
LYMPH%: 53.2 % — AB (ref 14.0–49.7)
MCH: 29.7 pg (ref 25.1–34.0)
MCHC: 32.7 g/dL (ref 31.5–36.0)
MCV: 90.6 fL (ref 79.5–101.0)
MONO#: 0.2 10*3/uL (ref 0.1–0.9)
MONO%: 5.5 % (ref 0.0–14.0)
NEUT%: 40.3 % (ref 38.4–76.8)
NEUTROS ABS: 1.2 10*3/uL — AB (ref 1.5–6.5)
Platelets: 302 10*3/uL (ref 145–400)
RBC: 4.19 10*6/uL (ref 3.70–5.45)
RDW: 15.8 % — AB (ref 11.2–14.5)
WBC: 3 10*3/uL — AB (ref 3.9–10.3)
lymph#: 1.6 10*3/uL (ref 0.9–3.3)

## 2015-04-19 MED ORDER — TBO-FILGRASTIM 300 MCG/0.5ML ~~LOC~~ SOSY
300.0000 ug | PREFILLED_SYRINGE | Freq: Once | SUBCUTANEOUS | Status: AC
  Administered 2015-04-19: 300 ug via SUBCUTANEOUS
  Filled 2015-04-19: qty 0.5

## 2015-04-19 NOTE — Telephone Encounter (Signed)
Scheduled patient per pof, avs report printed.  °

## 2015-04-19 NOTE — Progress Notes (Signed)
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 She had splenectomy by Dr Barry Dienes 02-02-15, with resection of involved pancreatic tail. Plan is to restage with scans after 4 cycles. CA 125, which had been elevated preop, is now in normal range. She is for day 15 cycle 2 on 04-21-15. She has recently needed addition of granix. She is to see Dr Skeet Latch 04-29-15.  Patient has had intermittent mild nausea which improves when she eats, which she prefers to taking prn antiemetics. She has been somewhat more fatigued, but mostly is going about regular activities. She had slight blood from nose, no other bleeding, will try saline nasal spray. No fever or symptoms of infection. No abdominal pain. Slight constipation, will try daily miralax. Bladder ok. No LE swelling, No SOB. Peripheral IV access still adequate. No significant peripheral neuropathy. Remainder of 10 point Review of Systems negative.   Flu vaccine 01-19-15 No PAC. All of previous chemo done peripheral Genetics testing normal 08-2013 ([employer] panel) Pneumovax, meningococcal vaccine and Hemophilus influenza all 01-14-15 Splenectomy 02-02-15 CA 125 was 2418 at presentation 03-2012 and 88 on 01-04-15 prior to splenectomy  ONCOLOGIC HISTORY Patient was in usual excellent health thru Sept 2013,then in Oct 2013 more fatigued, with some fecal urge incontinence and abdominal fullness such that she began to diet to lose weight. By Marcene Duos she could not climb one flight of stairs and could walk only from office to car without SOB and coughing. She was seen at urgent care in Dec with bilateral pleural effusions on CXR, referred to Dr Clois Comber, with 1.6  liter right thoracentesis done 03-11-12 (no path in this EMR). She had CT CAP and CT angio chest with large bilateral pleural effusions, no PE, ascites and pelvic mass. She had paracentesis for 1.4 liters on 04-02-12 and left thoracentesis for 2 liters on 04-05-12. CA 125 was 2418. She was seen in consultation by Dr Skeet Latch, with TAH/BSO, radical debulking and omentectomy on 04-09-2012, which was optimal debulking with only residual at completion of procedure being 7 mm area on posterior surface of right lobe liver. She was transfused 2 units PRBCs on 04-10-12 for Hgb down to 7.2 (from 12 preop). Pathology [phone]) high grade serous carcinoma involving right pelvic mass, serosa of uterus and peritoneum, omentum negative, no nodes submitted, felt IIIC right ovarian primary. Adjuvant therapy with dose dense taxol/ carboplatin began 05-03-12, with CA125 down to 30 by early April. Cycle 6 was completed 09-10-12. CA 125 remained in good range, including 14 in 03-2009 and 25 on 09-24-14, however was 88 in early Oct 2016. CT CAP 01-11-15 found mass at anterior aspect of spleen 11.5 x 8.2 x 13 cm, with stable unremarkable chest, no ascites, no adenopathy, no pelvic masses. She had splenectomy by Dr Barry Dienes 02-02-15, pathology [id number]) high grade serous carcinoma involving splenic capsule and parenchyma, 9 cm. There was no visualized abdominal carcinomatosis; tail of pancreas was adherent to mass and taken with spleen.   Objective:  Vital signs in last 24 hours:  BP 111/63 mmHg  Pulse 101  Temp(Src) 98.1 F (36.7 C) (Oral)  Resp 18  Ht '5\' 8"'  (1.727 m)  Wt 166 lb 1.6 oz (75.342 kg)  BMI 25.26 kg/m2  SpO2  99% Weight down 1 lb Alert, oriented and appropriate. Ambulatory without difficulty.  Partial alopecia  HEENT:PERRL, sclerae not icteric. Oral mucosa moist without lesions, posterior pharynx clear.  Neck supple. No JVD.  Lymphatics:no cervical,supraclavicular adenopathy Resp: clear to auscultation  bilaterally and normal percussion bilaterally Cardio: regular rate and rhythm. No gallop. GI: soft, nontender, not distended, no mass or organomegaly. Normally active bowel sounds. Surgical incisions not remarkable. Musculoskeletal/ Extremities: without pitting edema, cords, tenderness Neuro: no peripheral neuropathy. Otherwise nonfocal. PSYCH appropriate mood and affect Skin without rash, ecchymosis, petechiae   Lab Results:  Results for orders placed or performed in visit on 04/19/15  CBC with Differential  Result Value Ref Range   WBC 3.0 (L) 3.9 - 10.3 10e3/uL   NEUT# 1.2 (L) 1.5 - 6.5 10e3/uL   HGB 12.4 11.6 - 15.9 g/dL   HCT 37.9 34.8 - 46.6 %   Platelets 302 145 - 400 10e3/uL   MCV 90.6 79.5 - 101.0 fL   MCH 29.7 25.1 - 34.0 pg   MCHC 32.7 31.5 - 36.0 g/dL   RBC 4.19 3.70 - 5.45 10e6/uL   RDW 15.8 (H) 11.2 - 14.5 %   lymph# 1.6 0.9 - 3.3 10e3/uL   MONO# 0.2 0.1 - 0.9 10e3/uL   Eosinophils Absolute 0.0 0.0 - 0.5 10e3/uL   Basophils Absolute 0.0 0.0 - 0.1 10e3/uL   NEUT% 40.3 38.4 - 76.8 %   LYMPH% 53.2 (H) 14.0 - 49.7 %   MONO% 5.5 0.0 - 14.0 %   EOS% 0.4 0.0 - 7.0 %   BASO% 0.6 0.0 - 2.0 %  Comprehensive metabolic panel  Result Value Ref Range   Sodium 137 136 - 145 mEq/L   Potassium 4.1 3.5 - 5.1 mEq/L   Chloride 102 98 - 109 mEq/L   CO2 27 22 - 29 mEq/L   Glucose 101 70 - 140 mg/dl   BUN 14.3 7.0 - 26.0 mg/dL   Creatinine 0.8 0.6 - 1.1 mg/dL   Total Bilirubin 0.41 0.20 - 1.20 mg/dL   Alkaline Phosphatase 76 40 - 150 U/L   AST 17 5 - 34 U/L   ALT 20 0 - 55 U/L   Total Protein 7.0 6.4 - 8.3 g/dL   Albumin 3.9 3.5 - 5.0 g/dL   Calcium 9.4 8.4 - 10.4 mg/dL   Anion Gap 8 3 - 11 mEq/L   EGFR 84 (L) >90 ml/min/1.73 m2   CA 125 by previous lab method was 34 on 04-14-15, compared with 51 on 03-11-15 and 88 prior to splenectomy. (new lab method gives CA 125 27.9)  Studies/Results:  No results found.  Medications: I have reviewed the patient's current medications.  Add claritin x 2-3 days after each chemo, or fine to use it daily for duration of chemo.  Will use granix today in attempt to improve counts for day 15 cycle 2 on 04-21-15, and again 04-22-15. For cycle 3 will try days 2,3,9.  DISCUSSION Chemo leukopenia discussed and she understands use of claritin and additional days of granix.  With confusion re chemo and MD dates, will try to give her printed schedule today.  As Dr Skeet Latch has not seen her since recurrent disease in spleen, tho has been kept updated on situation, appointment with her as scheduled 04-29-15 seems reasonable, tho will not be finished with 4 cycles of chemo by that date.  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 Recurrence with 9 cm mass in splenic hilum at splenectomy 02-02-15, CA 125 elevated at 88 preoperatively, now in normal range. Plan 4 cycles dose dense carbo taxol, then expect will need PET CT.  Chemo leukopenia: Will use granix today to improve counts for day 15 cycle 2 on 04-21-15, and again 04-22-15. For cycle 3 will try granix days 2,3,9 to keep on schedule if possible 2.Post splenectomy: vaccines given 12-2014. She knows to be in touch with MD immediately if fever/ infectious illness, as low threshold for antibiotics  3. TFTs ok by PCP 4. mammograms up to date and not remarkable 5. Has not had colonoscopy. Referral to GI previously, defer this until after chemotherapy 6.slight preexisting peripheral neuropathy fingertips, not particularly worse with previous taxol. She understands that neuropathy may increase with additional taxol. 7. 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 Chemo and granix orders placed. Cc gyn oncology in case any change in Dr Leone Brand apt. Time spent 25 min including >50% counseling and coordination of care.    Gordy Levan, MD   04/19/2015, 12:46 PM

## 2015-04-19 NOTE — Telephone Encounter (Signed)
Per staff message and POF I have scheduled appts. Advised scheduler of appts. [name]  

## 2015-04-21 ENCOUNTER — Other Ambulatory Visit: Payer: Self-pay | Admitting: Oncology

## 2015-04-21 ENCOUNTER — Other Ambulatory Visit: Payer: Self-pay

## 2015-04-21 ENCOUNTER — Other Ambulatory Visit: Payer: BLUE CROSS/BLUE SHIELD

## 2015-04-21 ENCOUNTER — Other Ambulatory Visit (HOSPITAL_BASED_OUTPATIENT_CLINIC_OR_DEPARTMENT_OTHER): Payer: BLUE CROSS/BLUE SHIELD

## 2015-04-21 ENCOUNTER — Ambulatory Visit (HOSPITAL_BASED_OUTPATIENT_CLINIC_OR_DEPARTMENT_OTHER): Payer: BLUE CROSS/BLUE SHIELD

## 2015-04-21 VITALS — BP 125/73 | HR 110 | Temp 98.6°F | Resp 19

## 2015-04-21 DIAGNOSIS — Z5111 Encounter for antineoplastic chemotherapy: Secondary | ICD-10-CM

## 2015-04-21 DIAGNOSIS — C569 Malignant neoplasm of unspecified ovary: Secondary | ICD-10-CM

## 2015-04-21 DIAGNOSIS — C7889 Secondary malignant neoplasm of other digestive organs: Secondary | ICD-10-CM

## 2015-04-21 LAB — CBC WITH DIFFERENTIAL/PLATELET
BASO%: 0.1 % (ref 0.0–2.0)
BASOS ABS: 0 10*3/uL (ref 0.0–0.1)
EOS%: 0 % (ref 0.0–7.0)
Eosinophils Absolute: 0 10*3/uL (ref 0.0–0.5)
HCT: 35.9 % (ref 34.8–46.6)
HEMOGLOBIN: 11.9 g/dL (ref 11.6–15.9)
LYMPH#: 1 10*3/uL (ref 0.9–3.3)
LYMPH%: 9.1 % — ABNORMAL LOW (ref 14.0–49.7)
MCH: 30.5 pg (ref 25.1–34.0)
MCHC: 33.1 g/dL (ref 31.5–36.0)
MCV: 92.1 fL (ref 79.5–101.0)
MONO#: 0.3 10*3/uL (ref 0.1–0.9)
MONO%: 2.7 % (ref 0.0–14.0)
NEUT#: 9.9 10*3/uL — ABNORMAL HIGH (ref 1.5–6.5)
NEUT%: 88.1 % — ABNORMAL HIGH (ref 38.4–76.8)
NRBC: 0 % (ref 0–0)
Platelets: 351 10*3/uL (ref 145–400)
RBC: 3.9 10*6/uL (ref 3.70–5.45)
RDW: 16.2 % — AB (ref 11.2–14.5)
WBC: 11.3 10*3/uL — AB (ref 3.9–10.3)

## 2015-04-21 MED ORDER — DIPHENHYDRAMINE HCL 50 MG/ML IJ SOLN
INTRAMUSCULAR | Status: AC
  Filled 2015-04-21: qty 1

## 2015-04-21 MED ORDER — SODIUM CHLORIDE 0.9 % IV SOLN
Freq: Once | INTRAVENOUS | Status: AC
  Administered 2015-04-21: 16:00:00 via INTRAVENOUS
  Filled 2015-04-21: qty 4

## 2015-04-21 MED ORDER — PACLITAXEL CHEMO INJECTION 300 MG/50ML
80.0000 mg/m2 | Freq: Once | INTRAVENOUS | Status: AC
  Administered 2015-04-21: 150 mg via INTRAVENOUS
  Filled 2015-04-21: qty 25

## 2015-04-21 MED ORDER — DIPHENHYDRAMINE HCL 50 MG/ML IJ SOLN
25.0000 mg | Freq: Once | INTRAMUSCULAR | Status: AC
  Administered 2015-04-21: 25 mg via INTRAVENOUS

## 2015-04-21 MED ORDER — SODIUM CHLORIDE 0.9 % IV SOLN
Freq: Once | INTRAVENOUS | Status: AC
  Administered 2015-04-21: 16:00:00 via INTRAVENOUS

## 2015-04-21 MED ORDER — FAMOTIDINE IN NACL 20-0.9 MG/50ML-% IV SOLN
INTRAVENOUS | Status: AC
  Filled 2015-04-21: qty 50

## 2015-04-21 MED ORDER — FAMOTIDINE IN NACL 20-0.9 MG/50ML-% IV SOLN
20.0000 mg | Freq: Once | INTRAVENOUS | Status: AC
  Administered 2015-04-21: 20 mg via INTRAVENOUS

## 2015-04-21 NOTE — Patient Instructions (Signed)
[facility] Discharge Instructions for Patients Receiving Chemotherapy  Today you received the following chemotherapy agents Taxol   To help prevent nausea and vomiting after your treatment, we encourage you to take your nausea medication as directed.   If you develop nausea and vomiting that is not controlled by your nausea medication, call the clinic.   BELOW ARE SYMPTOMS THAT SHOULD BE REPORTED IMMEDIATELY:  *FEVER GREATER THAN 100.5 F  *CHILLS WITH OR WITHOUT FEVER  NAUSEA AND VOMITING THAT IS NOT CONTROLLED WITH YOUR NAUSEA MEDICATION  *UNUSUAL SHORTNESS OF BREATH  *UNUSUAL BRUISING OR BLEEDING  TENDERNESS IN MOUTH AND THROAT WITH OR WITHOUT PRESENCE OF ULCERS  *URINARY PROBLEMS  *BOWEL PROBLEMS  UNUSUAL RASH Items with * indicate a potential emergency and should be followed up as soon as possible.  Feel free to call the clinic you have any questions or concerns. The clinic phone number is [phone].  Please show the CHEMO ALERT CARD at check-in to the Emergency Department and triage nurse.   

## 2015-04-21 NOTE — Progress Notes (Signed)
Patient had a HR of 110 today, it was noted that she has been tachycardic the past two visits as well. She reports feeling well and having no symptoms.  She attributes this to her taking steroids at home and "that usually happens to me when I take steroids".

## 2015-04-21 NOTE — Progress Notes (Signed)
Spoke with Dr. Marko Plume via telephone; she stated pt is to have a repeat CBC today and then as long as labs are WNL of said treatment parameters, pt will have chemo today as ordered and Granix tomorrow. If labs are not WNL then we will page MD again for futher evaluation.

## 2015-04-22 ENCOUNTER — Ambulatory Visit (HOSPITAL_BASED_OUTPATIENT_CLINIC_OR_DEPARTMENT_OTHER): Payer: BLUE CROSS/BLUE SHIELD

## 2015-04-22 VITALS — BP 112/85 | HR 79 | Temp 98.5°F | Resp 18

## 2015-04-22 DIAGNOSIS — C569 Malignant neoplasm of unspecified ovary: Secondary | ICD-10-CM | POA: Diagnosis not present

## 2015-04-22 DIAGNOSIS — Z5189 Encounter for other specified aftercare: Secondary | ICD-10-CM

## 2015-04-22 MED ORDER — TBO-FILGRASTIM 300 MCG/0.5ML ~~LOC~~ SOSY
300.0000 ug | PREFILLED_SYRINGE | Freq: Once | SUBCUTANEOUS | Status: AC
  Administered 2015-04-22: 300 ug via SUBCUTANEOUS
  Filled 2015-04-22: qty 0.5

## 2015-04-25 ENCOUNTER — Other Ambulatory Visit: Payer: Self-pay | Admitting: Oncology

## 2015-04-26 ENCOUNTER — Encounter: Payer: Self-pay | Admitting: Oncology

## 2015-04-26 ENCOUNTER — Other Ambulatory Visit (HOSPITAL_BASED_OUTPATIENT_CLINIC_OR_DEPARTMENT_OTHER): Payer: BLUE CROSS/BLUE SHIELD

## 2015-04-26 ENCOUNTER — Telehealth: Payer: Self-pay | Admitting: Oncology

## 2015-04-26 ENCOUNTER — Ambulatory Visit (HOSPITAL_BASED_OUTPATIENT_CLINIC_OR_DEPARTMENT_OTHER): Payer: BLUE CROSS/BLUE SHIELD | Admitting: Oncology

## 2015-04-26 VITALS — BP 115/69 | HR 98 | Temp 98.7°F | Resp 18 | Wt 168.9 lb

## 2015-04-26 DIAGNOSIS — C569 Malignant neoplasm of unspecified ovary: Secondary | ICD-10-CM

## 2015-04-26 DIAGNOSIS — D701 Agranulocytosis secondary to cancer chemotherapy: Secondary | ICD-10-CM

## 2015-04-26 DIAGNOSIS — H04123 Dry eye syndrome of bilateral lacrimal glands: Secondary | ICD-10-CM

## 2015-04-26 DIAGNOSIS — C7989 Secondary malignant neoplasm of other specified sites: Secondary | ICD-10-CM

## 2015-04-26 DIAGNOSIS — C561 Malignant neoplasm of right ovary: Secondary | ICD-10-CM | POA: Diagnosis not present

## 2015-04-26 DIAGNOSIS — T451X5A Adverse effect of antineoplastic and immunosuppressive drugs, initial encounter: Secondary | ICD-10-CM

## 2015-04-26 DIAGNOSIS — Z9081 Acquired absence of spleen: Secondary | ICD-10-CM

## 2015-04-26 LAB — CBC WITH DIFFERENTIAL/PLATELET
BASO%: 0.5 % (ref 0.0–2.0)
BASOS ABS: 0 10*3/uL (ref 0.0–0.1)
EOS ABS: 0 10*3/uL (ref 0.0–0.5)
EOS%: 0.2 % (ref 0.0–7.0)
HCT: 37.5 % (ref 34.8–46.6)
HEMOGLOBIN: 12.4 g/dL (ref 11.6–15.9)
LYMPH#: 1.6 10*3/uL (ref 0.9–3.3)
LYMPH%: 35.8 % (ref 14.0–49.7)
MCH: 30.5 pg (ref 25.1–34.0)
MCHC: 33.1 g/dL (ref 31.5–36.0)
MCV: 92.1 fL (ref 79.5–101.0)
MONO#: 0.4 10*3/uL (ref 0.1–0.9)
MONO%: 8.7 % (ref 0.0–14.0)
NEUT#: 2.4 10*3/uL (ref 1.5–6.5)
NEUT%: 54.8 % (ref 38.4–76.8)
NRBC: 2 % — AB (ref 0–0)
PLATELETS: 314 10*3/uL (ref 145–400)
RBC: 4.07 10*6/uL (ref 3.70–5.45)
RDW: 16.2 % — AB (ref 11.2–14.5)
WBC: 4.4 10*3/uL (ref 3.9–10.3)

## 2015-04-26 LAB — COMPREHENSIVE METABOLIC PANEL
ALBUMIN: 3.8 g/dL (ref 3.5–5.0)
ALT: 18 U/L (ref 0–55)
ANION GAP: 8 meq/L (ref 3–11)
AST: 20 U/L (ref 5–34)
Alkaline Phosphatase: 77 U/L (ref 40–150)
BILIRUBIN TOTAL: 0.3 mg/dL (ref 0.20–1.20)
BUN: 14.4 mg/dL (ref 7.0–26.0)
CALCIUM: 9.5 mg/dL (ref 8.4–10.4)
CO2: 26 mEq/L (ref 22–29)
Chloride: 105 mEq/L (ref 98–109)
Creatinine: 0.8 mg/dL (ref 0.6–1.1)
EGFR: 81 mL/min/{1.73_m2} — AB (ref >90)
GLUCOSE: 97 mg/dL (ref 70–140)
POTASSIUM: 4.3 meq/L (ref 3.5–5.1)
SODIUM: 139 meq/L (ref 136–145)
TOTAL PROTEIN: 7 g/dL (ref 6.4–8.3)

## 2015-04-26 NOTE — Telephone Encounter (Signed)
Patient needed injection appointment changed for 2/2 to an earlier time. New calender printed and given to patient.

## 2015-04-26 NOTE — Progress Notes (Signed)
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 She will have day 1 cycle 3 dose dense carbo taxol on 04-28-15, with granix. Plan is to repeat scans after 4 cycles. She is to see Dr Skeet Latch later this week as previously scheduled.  Patient has felt well overall since day 15 cycle 2 taxol last week, tho she expects to be more fatigued after the [place] + taxol on 04-28-15. Energy has been good, including for a trip to [place] this weekend. She has some low grade nausea improved when she eats, no vomiting. Bowels are moving well. Minimal peripheral neuropathy tips of fingers not bothersome, and barely noticeable in feet. No abdominal or pelvic discomfort, no SOB, no pain, no fever or symptoms of infection, no bleeding. Peripheral IV access has been adequate. No allergic concerns with carboplatin to date, does have skin testing prior to each treatment. Eyes dry per ophthalmologic exam last year, not bothersome then, but seem very dry now: discussed more sensitive to air movement with some loss of eyelashes, does use sunglasses when outdoors, will start saline eye drops.  Remainder of 10 point Review of Systems negative.   Flu vaccine 01-19-15 No PAC. All of previous chemo done peripheral Genetics testing normal 08-2013 ([employer]Next panel) Pneumovax, meningococcal vaccine and Hemophilus influenza all 01-14-15 Splenectomy 02-02-15 CA 125 was 2418 at presentation 03-2012 and 88 on 01-04-15 prior to splenectomy  ONCOLOGIC HISTORY Patient was in usual excellent health thru Sept 2013,then in Oct 2013 more fatigued, with some fecal urge incontinence and abdominal fullness such that she began to diet to lose weight. By Marcene Duos she could not climb one flight of  stairs and could walk only from office to car without SOB and coughing. She was seen at urgent care in Dec with bilateral pleural effusions on CXR, referred to Dr Clois Comber, with 1.6 liter right thoracentesis done 03-11-12 (no path in this EMR). She had CT CAP and CT angio chest with large bilateral pleural effusions, no PE, ascites and pelvic mass. She had paracentesis for 1.4 liters on 04-02-12 and left thoracentesis for 2 liters on 04-05-12. CA 125 was 2418. She was seen in consultation by Dr Skeet Latch, with TAH/BSO, radical debulking and omentectomy on 04-09-2012, which was optimal debulking with only residual at completion of procedure being 7 mm area on posterior surface of right lobe liver. She was transfused 2 units PRBCs on 04-10-12 for Hgb down to 7.2 (from 12 preop). Pathology [phone]) high grade serous carcinoma involving right pelvic mass, serosa of uterus and peritoneum, omentum negative, no nodes submitted, felt IIIC right ovarian primary. Adjuvant therapy with dose dense taxol/ carboplatin began 05-03-12, with CA125 down to 30 by early April. Cycle 6 was completed 09-10-12. CA 125 remained in good range, including 14 in 03-2009 and 25 on 09-24-14, however was 88 in early Oct 2016. CT CAP 01-11-15 found mass at anterior aspect of spleen 11.5 x 8.2 x 13 cm, with stable unremarkable chest, no ascites, no adenopathy, no pelvic masses. She had splenectomy by Dr Barry Dienes 02-02-15, pathology [phone]) high grade serous carcinoma involving splenic capsule and parenchyma, 9 cm. There was no visualized abdominal carcinomatosis; tail of pancreas was adherent to mass and taken with spleen. She began dose dense carbo taxol on 03-16-15,  required addition of granix for leukopenia  Objective:  Vital signs in last 24 hours:  BP 115/69 mmHg  Pulse 98  Temp(Src) 98.7 F (37.1 C) (Oral)  Resp 18  Wt 168 lb 14.4 oz (76.613 kg)  SpO2 97% Weight up 2 lbs Alert, oriented and appropriate. Ambulatory without assistance  difficulty.  Partial alopecia  HEENT:PERRL, sclerae not icteric and not injected, partial loss of eyelashes. Oral mucosa moist without lesions, posterior pharynx clear.  Neck supple. No JVD.  Lymphatics:no cervical,supraclavicular or inguinal adenopathy Resp: clear to auscultation bilaterally and normal percussion bilaterally Cardio: regular rate and rhythm. No gallop. GI: soft, nontender, not distended, no mass or organomegaly. Normally active bowel sounds. Surgical incision from splenectomy not remarkable. Musculoskeletal/ Extremities: without pitting edema, cords, tenderness Neuro: no significant peripheral neuropathy. Otherwise nonfocal. PSYCH appropriate mood and affect Skin without rash, ecchymosis, petechiae   Lab Results:  Results for orders placed or performed in visit on 04/26/15  CBC with Differential  Result Value Ref Range   WBC 4.4 3.9 - 10.3 10e3/uL   NEUT# 2.4 1.5 - 6.5 10e3/uL   HGB 12.4 11.6 - 15.9 g/dL   HCT 37.5 34.8 - 46.6 %   Platelets 314 145 - 400 10e3/uL   MCV 92.1 79.5 - 101.0 fL   MCH 30.5 25.1 - 34.0 pg   MCHC 33.1 31.5 - 36.0 g/dL   RBC 4.07 3.70 - 5.45 10e6/uL   RDW 16.2 (H) 11.2 - 14.5 %   lymph# 1.6 0.9 - 3.3 10e3/uL   MONO# 0.4 0.1 - 0.9 10e3/uL   Eosinophils Absolute 0.0 0.0 - 0.5 10e3/uL   Basophils Absolute 0.0 0.0 - 0.1 10e3/uL   NEUT% 54.8 38.4 - 76.8 %   LYMPH% 35.8 14.0 - 49.7 %   MONO% 8.7 0.0 - 14.0 %   EOS% 0.2 0.0 - 7.0 %   BASO% 0.5 0.0 - 2.0 %   nRBC 2 (H) 0 - 0 %  Comprehensive metabolic panel  Result Value Ref Range   Sodium 139 136 - 145 mEq/L   Potassium 4.3 3.5 - 5.1 mEq/L   Chloride 105 98 - 109 mEq/L   CO2 26 22 - 29 mEq/L   Glucose 97 70 - 140 mg/dl   BUN 14.4 7.0 - 26.0 mg/dL   Creatinine 0.8 0.6 - 1.1 mg/dL   Total Bilirubin 0.30 0.20 - 1.20 mg/dL   Alkaline Phosphatase 77 40 - 150 U/L   AST 20 5 - 34 U/L   ALT 18 0 - 55 U/L   Total Protein 7.0 6.4 - 8.3 g/dL   Albumin 3.8 3.5 - 5.0 g/dL   Calcium 9.5 8.4 -  10.4 mg/dL   Anion Gap 8 3 - 11 mEq/L   EGFR 81 (L) >90 ml/min/1.73 m2  CA 125 from 04-14-15 was 34, this having been 88 prior to splenectomy on 01-04-15 and 51 in Dec ~ 5 weeks after splenectomy   Studies/Results:  No results found.  Medications: I have reviewed the patient's current medications. Increased Granix to 327m days 2,3,9 (and possibly 16). Saline eye drops prn.  DISCUSSION Patient is in agreement with continuing treatment as planned. Discussed CA 125 as above. She will keep appointment with Dr BSkeet Latchas scheduled, understands that we will repeat scans after 4 cycles and will let Dr BSkeet Latchknow those results also.  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 Recurrence with 9 cm mass in splenic hilum at splenectomy with resection of involved pancreatic tail 02-02-15, CA 125 elevated at 88 preoperatively, now in normal range. Plan 4 cycles dose dense carbo taxol, then  PET CT. To see Dr Skeet Latch on 04-29-15. Chemo leukopenia: Will use granix today to improve counts for day 15 cycle 2 on 04-21-15, and again 04-22-15. For cycle 3 will try granix days 2,3,9 (and possibly 16) to keep on schedule if possible 2.Post splenectomy: vaccines given 12-2014. She knows to be in touch with MD immediately if fever/ infectious illness, as low threshold for antibiotics  3. TFTs ok by PCP 4. mammograms up to date and not remarkable 5. Has not had colonoscopy. Referral to GI previously, defer this until after chemotherapy 6.slight preexisting peripheral neuropathy fingertips, not particularly worse with previous taxol. She understands that neuropathy may increase with additional taxol. 7. 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  All questions answered. Chemo and granix orders confirmed. Time spent 25 min including >50% counseling and coordination of care.   Mande Auvil P, MD   04/27/2015, 9:07 PM

## 2015-04-27 ENCOUNTER — Telehealth: Payer: Self-pay | Admitting: Gynecologic Oncology

## 2015-04-27 NOTE — Telephone Encounter (Signed)
Office Visit Note: Gyn-Onc  Shelley Sanchez 60 y.o. female  CC:  Recurrent ovarian cancer   Assessment/Plan:  60 y.o. who underwent optimal debulking on 04/09/2012. 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 She completed six cycles dose dense  TaxolCarboplatin therapy 08/2012. 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  Treated with splenectomy. Followup with GYN Onc in 3 months F/U with Shelley. Marko Plume for chemotherapy.  Consider addition of avastin.  CA125 increased to 22 from nadir of 12.  Will repeat.    HPI: 60 y.o. G3 P3 LNMP in early 4's. In October 2013 Shelley Sanchez noted fatigue, and fecal urge incontinence. Also noted increasing abdominal girth. No change in appetite, no nausea or vomiting. CT Chest/Abd/Pelvis 04/01/2012 notable for ascites so patient was admitted for evaluation. Paracentesis performed 04/01/2012. Results were negative for malignancy.  CA -125 at the time of presentation was 2418.   Shelley Sanchez underwent exploratory laparotomy total abdominal hysterectomy bilateral salpingo-oophorectomy omentectomy radical debulking to optimal disease on 04/09/2012.  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 The only gross disease present was a 7 mm lesion in the posterior most aspect of the liver.  Path consistent with stage IIIc high grade serous cancer.  She received  six cycles of dose dense Taxol carboplatin therapy completed 09/10/2012   Rising CA 125 noted in 2016.   CT CAP 01-11-15 found mass at anterior aspect of spleen 11.5 x 8.2 x 13 cm, with stable unremarkable chest, no ascites, no adenopathy, no pelvic masses. She had splenectomy by Shelley Sanchez 02-02-15, pathology [id number]) high grade serous carcinoma involving splenic capsule and parenchyma, 9 cm. There was no visualized abdominal carcinomatosis; tail of pancreas was adherent to mass and taken with spleen. She began dose  dense carbo taxol on 03-16-15  Lab Results  Component Value Date   CA125 34 04/14/2015   Doing very well  ROS:    Social Hx:  Resides with her significant other  Past Surgical Hx:  Past Surgical History  Procedure Laterality Date  . Breast enhancement surgery  2000  . Laparotomy  04/09/2012    Procedure: EXPLORATORY LAPAROTOMY;  Surgeon: Shelley Morning, MD PHD;  Location: [facility];  Service: Gynecology;  Laterality: N/A;  EXPLORATORY LAPAROTOMY, TOTAL ABDOMINAL HYESTERCTOMY, BILATERAL SALPINGO OOPHERECTOMY  . Abdominal hysterectomy  04/09/2012    Procedure: HYSTERECTOMY ABDOMINAL;  Surgeon: Shelley Morning, MD PHD;  Location: [facility];  Service: Gynecology;  Laterality: N/A;  . Mohs procedure to right side of base of nose       Past Medical Hx:  Past Medical History  Diagnosis Date  . Squamous cell carcinoma of right hand 2006    s/p excision  . Ascites 03/2012    on CT a/p; s/p paracentesis  . Ovarian cancer ([place]) 03/2012 dx    s/p debulk = stage III C poor diff, ov serous ca  . Family history of ovarian cancer   . 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 Hyperthyroidism   . Headache   Genetic testing was normal and did not reveal a mutation in these genes. The genes tested were ATM, BARD1, BRCA1, BRCA2, BRIP1, CDH1, CHEK2, EPCAM, MLH1, MRE11A, MSH2, MSH6, MUTYH, NBN, NF1, PALB2, PMS2, PTEN, RAD50, RAD51C, RAD51D, SMARCA4, STK11, and TP53.   Family Hx:  Family History  Problem Relation Age of Onset  . Melanoma Mother 30    on her back; TAH/BSO in ~30 d/t  bleeding  . Skin cancer Brother 56    squamous cell  . Thyroid cancer Brother 64    s/p thyroidectomy  . Ovarian cancer Maternal Aunt 8    "abd ca" possible ovarian; deceased 48s  . Rheum arthritis Mother   . Macular degeneration Mother    PHYSICAL EXAMINATION Vitals BP 114/70 mmHg  Pulse 64  Temp(Src) 97.7 F (36.5 C) (Oral)  Resp 20  Ht '5\' 8"'  (1.727 m)  Wt 164 lb (74.39 kg)  BMI 24.94  kg/m2  SpO2 100% Wt Readings from Last 3 Encounters:  04/26/15 168 lb 14.4 oz (76.613 kg)  04/19/15 166 lb 1.6 oz (75.342 kg)  03/30/15 167 lb 11.2 oz (76.068 kg)   Neck  Supple without any enlargements.  Cardiovascular  Pulse normal rate, regularity and rhythm.  Lungs  Clear to auscultation bilaterally  Psychiatry  Alert and oriented appropriate mood and affect.  Abdomen  Normoactive bowel sounds, abdomen soft, non-tender and obese. Surgical  site intact without evidence of hernia.  Genito Urinary  Vulva/vagina: Normal external female genitalia.  No lesions. No discharge or bleeding.  Bladder/urethra:  No lesions or masses  Vagina:  No masses no cul-de-sac nodularity,   Rectal  Good tone, no masses no cul de sac nodularity.  Extremities  No bilateral cyanosis, clubbing or edema. No rash, lesions or petiche.

## 2015-04-28 ENCOUNTER — Ambulatory Visit (HOSPITAL_BASED_OUTPATIENT_CLINIC_OR_DEPARTMENT_OTHER): Payer: BLUE CROSS/BLUE SHIELD

## 2015-04-28 ENCOUNTER — Other Ambulatory Visit (HOSPITAL_BASED_OUTPATIENT_CLINIC_OR_DEPARTMENT_OTHER): Payer: BLUE CROSS/BLUE SHIELD

## 2015-04-28 VITALS — BP 102/61 | HR 105 | Temp 97.0°F | Resp 20

## 2015-04-28 DIAGNOSIS — C569 Malignant neoplasm of unspecified ovary: Secondary | ICD-10-CM | POA: Diagnosis not present

## 2015-04-28 DIAGNOSIS — Z5111 Encounter for antineoplastic chemotherapy: Secondary | ICD-10-CM

## 2015-04-28 LAB — COMPREHENSIVE METABOLIC PANEL
ALBUMIN: 3.8 g/dL (ref 3.5–5.0)
ALK PHOS: 79 U/L (ref 40–150)
ALT: 17 U/L (ref 0–55)
AST: 11 U/L (ref 5–34)
Anion Gap: 12 mEq/L — ABNORMAL HIGH (ref 3–11)
BUN: 12.2 mg/dL (ref 7.0–26.0)
CHLORIDE: 106 meq/L (ref 98–109)
CO2: 21 meq/L — AB (ref 22–29)
Calcium: 9.3 mg/dL (ref 8.4–10.4)
Creatinine: 0.9 mg/dL (ref 0.6–1.1)
EGFR: 73 mL/min/{1.73_m2} — ABNORMAL LOW (ref >90)
GLUCOSE: 181 mg/dL — AB (ref 70–140)
POTASSIUM: 4.3 meq/L (ref 3.5–5.1)
SODIUM: 139 meq/L (ref 136–145)
Total Bilirubin: <0.3 mg/dL (ref 0.20–1.20)
Total Protein: 7.2 g/dL (ref 6.4–8.3)

## 2015-04-28 LAB — CBC WITH DIFFERENTIAL/PLATELET
BASO%: 0 % (ref 0.0–2.0)
Basophils Absolute: 0 10*3/uL (ref 0.0–0.1)
EOS%: 0 % (ref 0.0–7.0)
Eosinophils Absolute: 0 10*3/uL (ref 0.0–0.5)
HCT: 35.7 % (ref 34.8–46.6)
HEMOGLOBIN: 12 g/dL (ref 11.6–15.9)
LYMPH%: 13.1 % — ABNORMAL LOW (ref 14.0–49.7)
MCH: 30.5 pg (ref 25.1–34.0)
MCHC: 33.6 g/dL (ref 31.5–36.0)
MCV: 90.8 fL (ref 79.5–101.0)
MONO#: 0.1 10*3/uL (ref 0.1–0.9)
MONO%: 1 % (ref 0.0–14.0)
NEUT#: 4.4 10*3/uL (ref 1.5–6.5)
NEUT%: 85.9 % — AB (ref 38.4–76.8)
Platelets: 303 10*3/uL (ref 145–400)
RBC: 3.93 10*6/uL (ref 3.70–5.45)
RDW: 16.4 % — ABNORMAL HIGH (ref 11.2–14.5)
WBC: 5.1 10*3/uL (ref 3.9–10.3)
lymph#: 0.7 10*3/uL — ABNORMAL LOW (ref 0.9–3.3)
nRBC: 1 % — ABNORMAL HIGH (ref 0–0)

## 2015-04-28 MED ORDER — CARBOPLATIN CHEMO INTRADERMAL TEST DOSE 100MCG/0.02ML
100.0000 ug | Freq: Once | INTRADERMAL | Status: AC
  Administered 2015-04-28: 100 ug via INTRADERMAL
  Filled 2015-04-28: qty 0.01

## 2015-04-28 MED ORDER — FAMOTIDINE IN NACL 20-0.9 MG/50ML-% IV SOLN
INTRAVENOUS | Status: AC
  Filled 2015-04-28: qty 50

## 2015-04-28 MED ORDER — FAMOTIDINE IN NACL 20-0.9 MG/50ML-% IV SOLN
20.0000 mg | Freq: Once | INTRAVENOUS | Status: AC
  Administered 2015-04-28: 20 mg via INTRAVENOUS

## 2015-04-28 MED ORDER — SODIUM CHLORIDE 0.9 % IV SOLN
Freq: Once | INTRAVENOUS | Status: AC
  Administered 2015-04-28: 15:00:00 via INTRAVENOUS
  Filled 2015-04-28: qty 8

## 2015-04-28 MED ORDER — PACLITAXEL CHEMO INJECTION 300 MG/50ML
80.0000 mg/m2 | Freq: Once | INTRAVENOUS | Status: AC
  Administered 2015-04-28: 150 mg via INTRAVENOUS
  Filled 2015-04-28: qty 25

## 2015-04-28 MED ORDER — DIPHENHYDRAMINE HCL 50 MG/ML IJ SOLN
INTRAMUSCULAR | Status: AC
  Filled 2015-04-28: qty 1

## 2015-04-28 MED ORDER — DIPHENHYDRAMINE HCL 50 MG/ML IJ SOLN
25.0000 mg | Freq: Once | INTRAMUSCULAR | Status: AC
  Administered 2015-04-28: 25 mg via INTRAVENOUS

## 2015-04-28 MED ORDER — SODIUM CHLORIDE 0.9 % IV SOLN
522.0000 mg | Freq: Once | INTRAVENOUS | Status: AC
  Administered 2015-04-28: 520 mg via INTRAVENOUS
  Filled 2015-04-28: qty 52

## 2015-04-28 MED ORDER — SODIUM CHLORIDE 0.9 % IV SOLN
Freq: Once | INTRAVENOUS | Status: AC
  Administered 2015-04-28: 14:00:00 via INTRAVENOUS

## 2015-04-28 NOTE — Progress Notes (Signed)
Carbo test done on RFA. Pt assessment negative 5,10,78min. Pt asymptomatic.

## 2015-04-29 ENCOUNTER — Ambulatory Visit: Payer: BLUE CROSS/BLUE SHIELD | Attending: Gynecologic Oncology | Admitting: Gynecologic Oncology

## 2015-04-29 ENCOUNTER — Encounter: Payer: Self-pay | Admitting: Gynecologic Oncology

## 2015-04-29 ENCOUNTER — Ambulatory Visit (HOSPITAL_BASED_OUTPATIENT_CLINIC_OR_DEPARTMENT_OTHER): Payer: BLUE CROSS/BLUE SHIELD

## 2015-04-29 VITALS — BP 124/73 | HR 79 | Temp 98.1°F | Resp 18 | Ht 68.0 in | Wt 171.0 lb

## 2015-04-29 DIAGNOSIS — Z5189 Encounter for other specified aftercare: Secondary | ICD-10-CM | POA: Diagnosis not present

## 2015-04-29 DIAGNOSIS — C569 Malignant neoplasm of unspecified ovary: Secondary | ICD-10-CM | POA: Diagnosis not present

## 2015-04-29 MED ORDER — TBO-FILGRASTIM 300 MCG/0.5ML ~~LOC~~ SOSY
300.0000 ug | PREFILLED_SYRINGE | Freq: Once | SUBCUTANEOUS | Status: AC
  Administered 2015-04-29: 300 ug via SUBCUTANEOUS
  Filled 2015-04-29: qty 0.5

## 2015-04-29 NOTE — Progress Notes (Signed)
Office Visit Note: Gyn-Onc  Shelley Sanchez 60 y.o. female  CC:  Recurrent ovarian cancer   Assessment/Plan:  60 y.o. who underwent optimal debulking on 04/09/2012. 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 She completed six cycles dose dense.  TaxolCarboplatin therapy 08/2012. 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  Treated with splenectomy. Followup with GYN Onc in 3 months F/U with Dr. Marko Sanchez for chemotherapy.  Consider addition of avastin.    HPI: 60 y.o. G3 P3 LNMP in early 32's. In October 2013 Shelley Sanchez noted fatigue, and fecal urge incontinence. Also noted increasing abdominal girth. No change in appetite, no nausea or vomiting. CT Chest/Abd/Pelvis 04/01/2012 notable for ascites so patient was admitted for evaluation. Paracentesis performed 04/01/2012. Results were negative for malignancy.  CA -125 at the time of presentation was 2418.   Shelley Sanchez underwent exploratory laparotomy total abdominal hysterectomy bilateral salpingo-oophorectomy omentectomy radical debulking to optimal disease on 04/09/2012.  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 The only gross disease present was a 7 mm lesion in the posterior most aspect of the liver.  Path consistent with stage IIIc high grade serous cancer.  She received  six cycles of dose dense Taxol carboplatin therapy completed 09/10/2012   Rising CA 125 noted in 2016.   CT CAP 01-11-15 found mass at anterior aspect of spleen 11.5 x 8.2 x 13 cm, with stable unremarkable chest, no ascites, no adenopathy, no pelvic masses. 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 There was no visualized abdominal carcinomatosis; tail of pancreas was adherent to mass and taken with spleen. She began dose dense carbo taxol on 03-16-15  Lab Results   Component Value Date   CA125 34 04/14/2015   Doing very well  ROS:  Fatigue, difficulty concentrating, no change in bowel or bladder habits, no nausea or emesis, no vaginal bleeding, no SOB no chest pain otherwise 10 point ROs negative  Social Hx:  Resides with her significant other, works full time, overall happy  Past Surgical Hx:  Past Surgical History  Procedure Laterality Date  . Breast enhancement surgery  2000  . Laparotomy  04/09/2012    Procedure: EXPLORATORY LAPAROTOMY;  Surgeon: Shelley Morning, MD PHD;  Location: [facility];  Service: Gynecology;  Laterality: N/A;  EXPLORATORY LAPAROTOMY, TOTAL ABDOMINAL HYESTERCTOMY, BILATERAL SALPINGO OOPHERECTOMY  . Abdominal hysterectomy  04/09/2012    Procedure: HYSTERECTOMY ABDOMINAL;  Surgeon: Shelley Morning, MD PHD;  Location: [facility];  Service: Gynecology;  Laterality: N/A;  . Mohs procedure to right side of base of nose       Past Medical Hx:  Past Medical History  Diagnosis Date  . Squamous cell carcinoma of right hand 2006    s/p excision  . Ascites 03/2012    on CT a/p; s/p paracentesis  . Ovarian cancer ([place]) 03/2012 dx    s/p debulk = stage III C poor diff, ov serous ca  . Family history of ovarian cancer   . 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 Hyperthyroidism   . Headache   Genetic testing was normal and did not reveal a mutation in these genes. The genes tested were ATM, BARD1, BRCA1, BRCA2, BRIP1, CDH1, CHEK2, EPCAM, MLH1, MRE11A, MSH2, MSH6, MUTYH, NBN, NF1, PALB2, PMS2, PTEN, RAD50, RAD51C, RAD51D, SMARCA4, STK11, and TP53.   Family Hx:  Family History  Problem  Relation Age of Onset  . Melanoma Mother 70    on her back; TAH/BSO in ~30 d/t bleeding  . Skin cancer Brother 56    squamous cell  . Thyroid cancer Brother 58    s/p thyroidectomy  . Ovarian cancer Maternal Aunt 50    "abd ca" possible ovarian; deceased 50s  . Rheum arthritis Mother   . Macular degeneration Mother     PHYSICAL EXAMINATION Vitals BP 124/73 mmHg  Pulse 79  Temp(Src) 98.1 F (36.7 C) (Oral)  Resp 18  Ht 5' 8" (1.727 m)  Wt 171 lb (77.565 kg)  BMI 26.01 kg/m2  SpO2 100% Wt Readings from Last 3 Encounters:  04/29/15 171 lb (77.565 kg)  04/26/15 168 lb 14.4 oz (76.613 kg)  04/19/15 166 lb 1.6 oz (75.342 kg)   Neck  Supple without any enlargements.  Cardiovascular  Pulse normal rate, regularity and rhythm.  Lungs  Clear to auscultation bilaterally  Psychiatry  Alert and oriented appropriate mood and affect.  Abdomen  Normoactive bowel sounds, abdomen soft, non-tender and obese. Surgical  sites intact without evidence of hernia or tenderness Genito Urinary  Deferred Good tone, no masses no cul de sac nodularity.  Extremities  No bilateral cyanosis, clubbing or edema. No rash, lesions or petiche.     

## 2015-04-29 NOTE — Patient Instructions (Signed)
Will consider addition of Avastin.  Plan to follow up in three months or sooner if needed.  Please call for any questions or concerns.  Bevacizumab injection What is this medicine? BEVACIZUMAB (be va SIZ yoo mab) is a monoclonal antibody. It is used to treat cervical cancer, colorectal cancer, glioblastoma multiforme, non-small cell lung cancer (NSCLC), ovarian cancer, and renal cell cancer. This medicine may be used for other purposes; ask your health care provider or pharmacist if you have questions. What should I tell my health care provider before I take this medicine? They need to know if you have any of these conditions: -blood clots -heart disease, including heart failure, heart attack, or chest pain (angina) -high blood pressure -infection (especially a virus infection such as chickenpox, cold sores, or herpes) -kidney disease -lung disease -prior chemotherapy with doxorubicin, daunorubicin, epirubicin, or other anthracycline type chemotherapy agents -recent or ongoing radiation therapy -recent surgery -stroke -an unusual or allergic reaction to bevacizumab, hamster proteins, mouse proteins, other medicines, foods, dyes, or preservatives -pregnant or trying to get pregnant -breast-feeding How should I use this medicine? This medicine is for infusion into a vein. It is given by a health care professional in a hospital or clinic setting. Talk to your pediatrician regarding the use of this medicine in children. Special care may be needed. Overdosage: If you think you have taken too much of this medicine contact a poison control center or emergency room at once. NOTE: This medicine is only for you. Do not share this medicine with others. What if I miss a dose? It is important not to miss your dose. Call your doctor or health care professional if you are unable to keep an appointment. What may interact with this medicine? Interactions are not expected. This list may not describe all  possible interactions. Give your health care provider a list of all the medicines, herbs, non-prescription drugs, or dietary supplements you use. Also tell them if you smoke, drink alcohol, or use illegal drugs. Some items may interact with your medicine. What should I watch for while using this medicine? Your condition will be monitored carefully while you are receiving this medicine. You will need important blood work and urine testing done while you are taking this medicine. During your treatment, let your health care professional know if you have any unusual symptoms, such as difficulty breathing. This medicine may rarely cause 'gastrointestinal perforation' (holes in the stomach, intestines or colon), a serious side effect requiring surgery to repair. This medicine should be started at least 28 days following major surgery and the site of the surgery should be totally healed. Check with your doctor before scheduling dental work or surgery while you are receiving this treatment. Talk to your doctor if you have recently had surgery or if you have a wound that has not healed. Do not become pregnant while taking this medicine or for 6 months after stopping it. Women should inform their doctor if they wish to become pregnant or think they might be pregnant. There is a potential for serious side effects to an unborn child. Talk to your health care professional or pharmacist for more information. Do not breast-feed an infant while taking this medicine. This medicine has caused ovarian failure in some women. This medicine may interfere with the ability to have a child. You should talk to your doctor or health care professional if you are concerned about your fertility. What side effects may I notice from receiving this medicine? Side effects that  you should report to your doctor or health care professional as soon as possible: -allergic reactions like skin rash, itching or hives, swelling of the face, lips,  or tongue -signs of infection - fever or chills, cough, sore throat, pain or trouble passing urine -signs of decreased platelets or bleeding - bruising, pinpoint red spots on the skin, black, tarry stools, nosebleeds, blood in the urine -breathing problems -changes in vision -chest pain -confusion -jaw pain, especially after dental work -mouth sores -seizures -severe abdominal pain -severe headache -sudden numbness or weakness of the face, arm or leg -swelling of legs or ankles -symptoms of a stroke: change in mental awareness, inability to talk or move one side of the body (especially in patients with lung cancer) -trouble passing urine or change in the amount of urine -trouble speaking or understanding -trouble walking, dizziness, loss of balance or coordination Side effects that usually do not require medical attention (report to your doctor or health care professional if they continue or are bothersome): -constipation -diarrhea -dry skin -headache -loss of appetite -nausea, vomiting This list may not describe all possible side effects. Call your doctor for medical advice about side effects. You may report side effects to FDA at 1-800-FDA-1088. Where should I keep my medicine? This drug is given in a hospital or clinic and will not be stored at home. NOTE: This sheet is a summary. It may not cover all possible information. If you have questions about this medicine, talk to your doctor, pharmacist, or health care provider.    2016, [employer]vier/[employer] Standard. (2014-05-12 16:58:44)

## 2015-04-30 ENCOUNTER — Other Ambulatory Visit: Payer: Self-pay | Admitting: Oncology

## 2015-04-30 ENCOUNTER — Ambulatory Visit: Payer: BLUE CROSS/BLUE SHIELD

## 2015-05-03 ENCOUNTER — Telehealth: Payer: Self-pay | Admitting: *Deleted

## 2015-05-03 ENCOUNTER — Other Ambulatory Visit: Payer: Self-pay | Admitting: Oncology

## 2015-05-03 NOTE — Telephone Encounter (Signed)
"  I missed my injection 04-30-2015 because i waas too sick to come in.  I'm off tomorrow and can come in for (granix) injection."  Return number [phone]

## 2015-05-03 NOTE — Telephone Encounter (Signed)
LM in Shelley Sanchez's voice mail that Dr. Marko Plume said that she can receive the granix injection tomorrow as her treatment is 05-05-15. Reminded her that she is to have a granix injection again after 05-05-15 treatment  on 05-06-15 at 4:14 pm

## 2015-05-04 ENCOUNTER — Ambulatory Visit (HOSPITAL_BASED_OUTPATIENT_CLINIC_OR_DEPARTMENT_OTHER): Payer: BLUE CROSS/BLUE SHIELD

## 2015-05-04 VITALS — BP 116/64 | HR 92 | Temp 98.5°F

## 2015-05-04 DIAGNOSIS — C569 Malignant neoplasm of unspecified ovary: Secondary | ICD-10-CM

## 2015-05-04 DIAGNOSIS — Z5189 Encounter for other specified aftercare: Secondary | ICD-10-CM

## 2015-05-04 MED ORDER — TBO-FILGRASTIM 300 MCG/0.5ML ~~LOC~~ SOSY
300.0000 ug | PREFILLED_SYRINGE | Freq: Once | SUBCUTANEOUS | Status: AC
  Administered 2015-05-04: 300 ug via SUBCUTANEOUS
  Filled 2015-05-04: qty 0.5

## 2015-05-05 ENCOUNTER — Other Ambulatory Visit (HOSPITAL_BASED_OUTPATIENT_CLINIC_OR_DEPARTMENT_OTHER): Payer: BLUE CROSS/BLUE SHIELD

## 2015-05-05 ENCOUNTER — Ambulatory Visit (HOSPITAL_BASED_OUTPATIENT_CLINIC_OR_DEPARTMENT_OTHER): Payer: BLUE CROSS/BLUE SHIELD

## 2015-05-05 DIAGNOSIS — Z5111 Encounter for antineoplastic chemotherapy: Secondary | ICD-10-CM | POA: Diagnosis not present

## 2015-05-05 DIAGNOSIS — C569 Malignant neoplasm of unspecified ovary: Secondary | ICD-10-CM

## 2015-05-05 LAB — CBC WITH DIFFERENTIAL/PLATELET
BASO%: 0.2 % (ref 0.0–2.0)
BASOS ABS: 0 10*3/uL (ref 0.0–0.1)
EOS%: 0 % (ref 0.0–7.0)
Eosinophils Absolute: 0 10*3/uL (ref 0.0–0.5)
HEMATOCRIT: 36.5 % (ref 34.8–46.6)
HGB: 12 g/dL (ref 11.6–15.9)
LYMPH#: 0.6 10*3/uL — AB (ref 0.9–3.3)
LYMPH%: 3.2 % — ABNORMAL LOW (ref 14.0–49.7)
MCH: 30.2 pg (ref 25.1–34.0)
MCHC: 32.9 g/dL (ref 31.5–36.0)
MCV: 91.6 fL (ref 79.5–101.0)
MONO#: 0.2 10*3/uL (ref 0.1–0.9)
MONO%: 1 % (ref 0.0–14.0)
NEUT#: 18.5 10*3/uL — ABNORMAL HIGH (ref 1.5–6.5)
NEUT%: 95.6 % — AB (ref 38.4–76.8)
Platelets: 352 10*3/uL (ref 145–400)
RBC: 3.98 10*6/uL (ref 3.70–5.45)
RDW: 17.2 % — ABNORMAL HIGH (ref 11.2–14.5)
WBC: 19.3 10*3/uL — ABNORMAL HIGH (ref 3.9–10.3)

## 2015-05-05 LAB — COMPREHENSIVE METABOLIC PANEL
ALT: 14 U/L (ref 0–55)
AST: 13 U/L (ref 5–34)
Albumin: 4 g/dL (ref 3.5–5.0)
Alkaline Phosphatase: 86 U/L (ref 40–150)
Anion Gap: 11 mEq/L (ref 3–11)
BUN: 21.8 mg/dL (ref 7.0–26.0)
CALCIUM: 9.6 mg/dL (ref 8.4–10.4)
CHLORIDE: 103 meq/L (ref 98–109)
CO2: 23 meq/L (ref 22–29)
CREATININE: 0.7 mg/dL (ref 0.6–1.1)
EGFR: 89 mL/min/{1.73_m2} — ABNORMAL LOW (ref >90)
GLUCOSE: 137 mg/dL (ref 70–140)
POTASSIUM: 4.6 meq/L (ref 3.5–5.1)
SODIUM: 138 meq/L (ref 136–145)
Total Bilirubin: 0.3 mg/dL (ref 0.20–1.20)
Total Protein: 7.4 g/dL (ref 6.4–8.3)

## 2015-05-05 MED ORDER — FAMOTIDINE IN NACL 20-0.9 MG/50ML-% IV SOLN
INTRAVENOUS | Status: AC
  Filled 2015-05-05: qty 50

## 2015-05-05 MED ORDER — DIPHENHYDRAMINE HCL 50 MG/ML IJ SOLN
INTRAMUSCULAR | Status: AC
  Filled 2015-05-05: qty 1

## 2015-05-05 MED ORDER — SODIUM CHLORIDE 0.9 % IV SOLN
Freq: Once | INTRAVENOUS | Status: AC
  Administered 2015-05-05: 14:00:00 via INTRAVENOUS
  Filled 2015-05-05: qty 4

## 2015-05-05 MED ORDER — DIPHENHYDRAMINE HCL 50 MG/ML IJ SOLN
25.0000 mg | Freq: Once | INTRAMUSCULAR | Status: AC
  Administered 2015-05-05: 25 mg via INTRAVENOUS

## 2015-05-05 MED ORDER — SODIUM CHLORIDE 0.9 % IV SOLN
80.0000 mg/m2 | Freq: Once | INTRAVENOUS | Status: AC
  Administered 2015-05-05: 150 mg via INTRAVENOUS
  Filled 2015-05-05: qty 25

## 2015-05-05 MED ORDER — SODIUM CHLORIDE 0.9 % IV SOLN
Freq: Once | INTRAVENOUS | Status: AC
  Administered 2015-05-05: 14:00:00 via INTRAVENOUS

## 2015-05-05 MED ORDER — FAMOTIDINE IN NACL 20-0.9 MG/50ML-% IV SOLN
20.0000 mg | Freq: Once | INTRAVENOUS | Status: AC
  Administered 2015-05-05: 20 mg via INTRAVENOUS

## 2015-05-05 NOTE — Patient Instructions (Signed)
[facility] Discharge Instructions for Patients Receiving Chemotherapy  Today you received the following chemotherapy agents taxol  To help prevent nausea and vomiting after your treatment, we encourage you to take your nausea medication as directed   If you develop nausea and vomiting that is not controlled by your nausea medication, call the clinic.   BELOW ARE SYMPTOMS THAT SHOULD BE REPORTED IMMEDIATELY:  *FEVER GREATER THAN 100.5 F  *CHILLS WITH OR WITHOUT FEVER  NAUSEA AND VOMITING THAT IS NOT CONTROLLED WITH YOUR NAUSEA MEDICATION  *UNUSUAL SHORTNESS OF BREATH  *UNUSUAL BRUISING OR BLEEDING  TENDERNESS IN MOUTH AND THROAT WITH OR WITHOUT PRESENCE OF ULCERS  *URINARY PROBLEMS  *BOWEL PROBLEMS  UNUSUAL RASH Items with * indicate a potential emergency and should be followed up as soon as possible.  Feel free to call the clinic you have any questions or concerns. The clinic phone number is [phone].  Please show the CHEMO ALERT CARD at check-in to the Emergency Department and triage nurse.   

## 2015-05-06 ENCOUNTER — Ambulatory Visit (HOSPITAL_BASED_OUTPATIENT_CLINIC_OR_DEPARTMENT_OTHER): Payer: BLUE CROSS/BLUE SHIELD

## 2015-05-06 VITALS — BP 117/72 | HR 70 | Temp 98.1°F

## 2015-05-06 DIAGNOSIS — Z5189 Encounter for other specified aftercare: Secondary | ICD-10-CM | POA: Diagnosis not present

## 2015-05-06 DIAGNOSIS — C569 Malignant neoplasm of unspecified ovary: Secondary | ICD-10-CM | POA: Diagnosis not present

## 2015-05-06 MED ORDER — TBO-FILGRASTIM 300 MCG/0.5ML ~~LOC~~ SOSY
300.0000 ug | PREFILLED_SYRINGE | Freq: Once | SUBCUTANEOUS | Status: AC
  Administered 2015-05-06: 300 ug via SUBCUTANEOUS
  Filled 2015-05-06: qty 0.5

## 2015-05-09 ENCOUNTER — Other Ambulatory Visit: Payer: Self-pay | Admitting: Oncology

## 2015-05-09 DIAGNOSIS — C561 Malignant neoplasm of right ovary: Secondary | ICD-10-CM

## 2015-05-09 DIAGNOSIS — C7889 Secondary malignant neoplasm of other digestive organs: Secondary | ICD-10-CM

## 2015-05-10 ENCOUNTER — Ambulatory Visit (HOSPITAL_BASED_OUTPATIENT_CLINIC_OR_DEPARTMENT_OTHER): Payer: BLUE CROSS/BLUE SHIELD | Admitting: Oncology

## 2015-05-10 ENCOUNTER — Other Ambulatory Visit (HOSPITAL_BASED_OUTPATIENT_CLINIC_OR_DEPARTMENT_OTHER): Payer: BLUE CROSS/BLUE SHIELD

## 2015-05-10 ENCOUNTER — Telehealth: Payer: Self-pay | Admitting: Oncology

## 2015-05-10 ENCOUNTER — Encounter: Payer: Self-pay | Admitting: Oncology

## 2015-05-10 VITALS — BP 107/68 | HR 95 | Temp 98.4°F | Resp 18 | Ht 68.0 in | Wt 171.1 lb

## 2015-05-10 DIAGNOSIS — D701 Agranulocytosis secondary to cancer chemotherapy: Secondary | ICD-10-CM | POA: Diagnosis not present

## 2015-05-10 DIAGNOSIS — C561 Malignant neoplasm of right ovary: Secondary | ICD-10-CM

## 2015-05-10 DIAGNOSIS — G622 Polyneuropathy due to other toxic agents: Secondary | ICD-10-CM

## 2015-05-10 DIAGNOSIS — C569 Malignant neoplasm of unspecified ovary: Secondary | ICD-10-CM

## 2015-05-10 DIAGNOSIS — R112 Nausea with vomiting, unspecified: Secondary | ICD-10-CM

## 2015-05-10 DIAGNOSIS — C7989 Secondary malignant neoplasm of other specified sites: Secondary | ICD-10-CM | POA: Diagnosis not present

## 2015-05-10 DIAGNOSIS — Z9081 Acquired absence of spleen: Secondary | ICD-10-CM

## 2015-05-10 DIAGNOSIS — C7889 Secondary malignant neoplasm of other digestive organs: Secondary | ICD-10-CM

## 2015-05-10 DIAGNOSIS — T451X5A Adverse effect of antineoplastic and immunosuppressive drugs, initial encounter: Secondary | ICD-10-CM

## 2015-05-10 LAB — CBC WITH DIFFERENTIAL/PLATELET
BASO%: 0.6 % (ref 0.0–2.0)
Basophils Absolute: 0 10*3/uL (ref 0.0–0.1)
EOS%: 0.9 % (ref 0.0–7.0)
Eosinophils Absolute: 0 10*3/uL (ref 0.0–0.5)
HEMATOCRIT: 34.1 % — AB (ref 34.8–46.6)
HEMOGLOBIN: 11.1 g/dL — AB (ref 11.6–15.9)
LYMPH#: 2.3 10*3/uL (ref 0.9–3.3)
LYMPH%: 63 % — ABNORMAL HIGH (ref 14.0–49.7)
MCH: 30.2 pg (ref 25.1–34.0)
MCHC: 32.5 g/dL (ref 31.5–36.0)
MCV: 93 fL (ref 79.5–101.0)
MONO#: 0.3 10*3/uL (ref 0.1–0.9)
MONO%: 8.5 % (ref 0.0–14.0)
NEUT#: 1 10*3/uL — ABNORMAL LOW (ref 1.5–6.5)
NEUT%: 27 % — ABNORMAL LOW (ref 38.4–76.8)
Platelets: 375 10*3/uL (ref 145–400)
RBC: 3.67 10*6/uL — ABNORMAL LOW (ref 3.70–5.45)
RDW: 17.7 % — AB (ref 11.2–14.5)
WBC: 3.7 10*3/uL — AB (ref 3.9–10.3)

## 2015-05-10 LAB — COMPREHENSIVE METABOLIC PANEL
ALBUMIN: 3.7 g/dL (ref 3.5–5.0)
ALK PHOS: 81 U/L (ref 40–150)
ALT: 16 U/L (ref 0–55)
AST: 15 U/L (ref 5–34)
Anion Gap: 9 mEq/L (ref 3–11)
BUN: 20.2 mg/dL (ref 7.0–26.0)
CALCIUM: 9.2 mg/dL (ref 8.4–10.4)
CHLORIDE: 105 meq/L (ref 98–109)
CO2: 27 mEq/L (ref 22–29)
CREATININE: 0.8 mg/dL (ref 0.6–1.1)
EGFR: 75 mL/min/{1.73_m2} — ABNORMAL LOW (ref >90)
GLUCOSE: 95 mg/dL (ref 70–140)
POTASSIUM: 4.1 meq/L (ref 3.5–5.1)
SODIUM: 141 meq/L (ref 136–145)
Total Bilirubin: 0.4 mg/dL (ref 0.20–1.20)
Total Protein: 6.9 g/dL (ref 6.4–8.3)

## 2015-05-10 MED ORDER — TBO-FILGRASTIM 300 MCG/0.5ML ~~LOC~~ SOSY
300.0000 ug | PREFILLED_SYRINGE | Freq: Once | SUBCUTANEOUS | Status: AC
  Administered 2015-05-10: 300 ug via SUBCUTANEOUS
  Filled 2015-05-10: qty 0.5

## 2015-05-10 NOTE — Telephone Encounter (Signed)
Added additional appointments for February and March per pof sent 2/13 regarding corrections. Left message for patient re appointments being added in addition to already existing appointments for February and March. Confirmed 2/15 appointment and asked that patient stop by for new schedule 2/15.

## 2015-05-10 NOTE — Progress Notes (Signed)
OFFICE PROGRESS NOTE   May 10, 2015   Physicians: W.Brewster, M.Wert, V.Leschber/ Billey Gosling , Stark Klein  INTERVAL HISTORY:  Shelley Sanchez is seen, alone for visit, in continuing attention to recurrent high grade serous carcinoma of right ovary involving spleen, now receiving chemotherapy following resection of spleen and tail of pancreas 02-02-15. She had day 8 cycle 3 dose dense carbo taxol on 05-05-15 and granix on 05-06-15. She saw Dr Skeet Latch on 04-29-15, to see her again in 3 months. Plan scans after cycle 4. Dr Skeet Latch mentions consideration of avastin  Patient was very nauseated day 3 cycle 3, did not take antiemetics until actually nauseated then unable to keep them down. She did not keep appointment for granix that day due to severe nausea. She is not using protonix daily, but will begin this, has low grade nausea frequently that is better when she eats. She has been able to continue to work from home most days, fatigue and nausea most limiting. She denies abdominal or pelvic pain. No fever or symptoms of infection. No SOB. Minimal peripheral neuropathy not bothersome. No bleeding. Bowels and bladder seem ok. Erythema at multiple areas of sun related skin damage arms and face. Peripheral IV access adequate thus far. Remainder of 10 point Review of Systems negative  Flu vaccine 01-19-15 No PAC. All of previous chemo done peripheral Genetics testing normal 08-2013 ([facility] panel) Pneumovax, meningococcal vaccine and Hemophilus influenza all 01-14-15 Splenectomy 02-02-15 CA 125 was 2418 at presentation 03-2012 and 88 on 01-04-15 prior to splenectomy  ONCOLOGIC HISTORY Patient was in usual excellent health thru Sept 2013,then in Oct 2013 more fatigued, with some fecal urge incontinence and abdominal fullness such that she began to diet to lose weight. By Marcene Duos she could not climb one flight of stairs and could walk only from office to car without SOB and coughing. She was seen at urgent care  in Dec with bilateral pleural effusions on CXR, referred to Dr Clois Comber, with 1.6 liter right thoracentesis done 03-11-12 (no path in this EMR). She had CT CAP and CT angio chest with large bilateral pleural effusions, no PE, ascites and pelvic mass. She had paracentesis for 1.4 liters on 04-02-12 and left thoracentesis for 2 liters on 04-05-12. CA 125 was 2418. She was seen in consultation by Dr Skeet Latch, with TAH/BSO, radical debulking and omentectomy on 04-09-2012, which was optimal debulking with only residual at completion of procedure being 7 mm area on posterior surface of right lobe liver. She was transfused 2 units PRBCs on 04-10-12 for Hgb down to 7.2 (from 12 preop). Pathology [id number]) high grade serous carcinoma involving right pelvic mass, serosa of uterus and peritoneum, omentum negative, no nodes submitted, felt IIIC right ovarian primary. Adjuvant therapy with dose dense taxol/ carboplatin began 05-03-12, with CA125 down to 30 by early April. Cycle 6 was completed 09-10-12. CA 125 remained in good range, including 14 in 03-2009 and 25 on 09-24-14, however was 88 in early Oct 2016. CT CAP 01-11-15 found mass at anterior aspect of spleen 11.5 x 8.2 x 13 cm, with stable unremarkable chest, no ascites, no adenopathy, no pelvic masses. She had splenectomy by Dr Barry Dienes 02-02-15, pathology [id number]) high grade serous carcinoma involving splenic capsule and parenchyma, 9 cm. There was no visualized abdominal carcinomatosis; tail of pancreas was adherent to mass and taken with spleen. She began dose dense carbo taxol on 03-16-15, required addition of granix for leukopenia   Objective:  Vital signs in last 24 hours:  BP 107/68 mmHg  Pulse 95  Temp(Src) 98.4 F (36.9 C) (Oral)  Resp 18  Ht _0  (1.727 m)  Wt 171 lb 1.6 oz (77.61 kg)  BMI 26.02 kg/m2  SpO2 99% Weight up 3 lbs Alert, oriented and appropriate. Ambulatory without difficulty. Respirations not labored. Looks mildly fatigued, has  obviously gained weight and / or slightly cushingoid facies (weekly decadron with taxol) Partial alopecia  HEENT:PERRL, sclerae not icteric. Oral mucosa moist without lesions, posterior pharynx clear.  Neck supple. No JVD.  Lymphatics:no cervical,supraclavicular or inguinal adenopathy Resp: clear to auscultation bilaterally and normal percussion bilaterally Cardio: regular rate and rhythm. No gallop. GI: soft, slightly tender in epigastrium, not distended, no mass or organomegaly. Normally active bowel sounds. Surgical incisions not remarkable. Musculoskeletal/ Extremities: without pitting edema, cords, tenderness Neuro: no significant peripheral neuropathy. Otherwise nonfocal. PSYCH appropriate mood and affect Skin without rash, ecchymosis, petechiae. Scattered areas of sun damage with increased erythema.   Lab Results:  Results for orders placed or performed in visit on 05/10/15  CBC with Differential  Result Value Ref Range   WBC 3.7 (L) 3.9 - 10.3 10e3/uL   NEUT# 1.0 (L) 1.5 - 6.5 10e3/uL   HGB 11.1 (L) 11.6 - 15.9 g/dL   HCT 34.1 (L) 34.8 - 46.6 %   Platelets 375 145 - 400 10e3/uL   MCV 93.0 79.5 - 101.0 fL   MCH 30.2 25.1 - 34.0 pg   MCHC 32.5 31.5 - 36.0 g/dL   RBC 3.67 (L) 3.70 - 5.45 10e6/uL   RDW 17.7 (H) 11.2 - 14.5 %   lymph# 2.3 0.9 - 3.3 10e3/uL   MONO# 0.3 0.1 - 0.9 10e3/uL   Eosinophils Absolute 0.0 0.0 - 0.5 10e3/uL   Basophils Absolute 0.0 0.0 - 0.1 10e3/uL   NEUT% 27.0 (L) 38.4 - 76.8 %   LYMPH% 63.0 (H) 14.0 - 49.7 %   MONO% 8.5 0.0 - 14.0 %   EOS% 0.9 0.0 - 7.0 %   BASO% 0.6 0.0 - 2.0 %  Comprehensive metabolic panel  Result Value Ref Range   Sodium 141 136 - 145 mEq/L   Potassium 4.1 3.5 - 5.1 mEq/L   Chloride 105 98 - 109 mEq/L   CO2 27 22 - 29 mEq/L   Glucose 95 70 - 140 mg/dl   BUN 20.2 7.0 - 26.0 mg/dL   Creatinine 0.8 0.6 - 1.1 mg/dL   Total Bilirubin 0.40 0.20 - 1.20 mg/dL   Alkaline Phosphatase 81 40 - 150 U/L   AST 15 5 - 34 U/L   ALT 16  0 - 55 U/L   Total Protein 6.9 6.4 - 8.3 g/dL   Albumin 3.7 3.5 - 5.0 g/dL   Calcium 9.2 8.4 - 10.4 mg/dL   Anion Gap 9 3 - 11 mEq/L   EGFR 75 (L) >90 ml/min/1.73 m2   CA 125 sent and pending  Studies/Results:  No results found.  Medications: I have reviewed the patient's current medications. Granix given today; will use this days 2,3 9,16 for cycle 4 Patient instructed to take protonix daily beginning today until chemo completed Patient instructed to take zofran night of 2-23 whether or not any nausea, AM and PM of 2-24 whether or not any nausea. She is not to drive if she takes ativan.   DISCUSSION Have given written and oral instructions to take zofran night of day 2 and at least bid on day 3 starting in AM, these doses whether or not any  nausea. Have told her to begin daily protonix starting today. She can certainly have additional IVF on day 3 if needed, tho she does not want that set up now.   ANC just 1.0 today, will give granix and repeat counts day of chemo 2-15. Will increase granix to 480 mg for doses after cycle 4.  She understands that avastin is an option depending on results of scans after cycle 4.   Appointment schedule is not easy for her to follow and remember, however needs granix as ordered. Have tried to clarify scheduling with POF now  Discussed flare of precancerous skin changes related to chemo, which may overall improve from this, similar to topical chemo based creams used by dermatology for this reason.   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 Recurrence with 9 cm mass in splenic hilum at splenectomy with resection of involved pancreatic tail 02-02-15, CA 125 elevated at 88 preoperatively, now in normal range. Plan 4 cycles dose dense carbo taxol, then PET CT. OK for day 15 cycle 3 on 2-15 as long  as ANC >=1.5 and plt >=100k by lab that day.  2.Post splenectomy: vaccines given 12-2014. She knows to be in touch with MD immediately if fever/ infectious illness, as low threshold for antibiotics  3. TFTs ok by PCP 4. mammograms up to date and not remarkable 5. Has not had colonoscopy. Referral to GI previously, defer this until after chemotherapy 6.slight preexisting peripheral neuropathy fingertips, not particularly worse with previous taxol. She understands that neuropathy may increase with additional taxol. 7. 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 12.chemo neutropenia: will increase granix dose to 480 mg and hopefully use this on days 2,3,9,16   All questions answered and patient seems to understand and be in agreement with recommendations and plans. Chemo and granix orders placed. Time spent 25 min including >50% counseling and coordination of care. I will see her back during cycle 4. Will get PET CT after cycle 4.   Gordy Levan, MD   05/10/2015, 5:32 PM

## 2015-05-11 LAB — CA 125: Cancer Antigen (CA) 125: 26.1 U/mL (ref 0.0–38.1)

## 2015-05-11 LAB — CANCER ANTIGEN 125 (PARALLEL TESTING): CA 125: 35 U/mL — AB (ref <35)

## 2015-05-12 ENCOUNTER — Ambulatory Visit (HOSPITAL_BASED_OUTPATIENT_CLINIC_OR_DEPARTMENT_OTHER): Payer: BLUE CROSS/BLUE SHIELD | Admitting: *Deleted

## 2015-05-12 ENCOUNTER — Ambulatory Visit (HOSPITAL_BASED_OUTPATIENT_CLINIC_OR_DEPARTMENT_OTHER): Payer: BLUE CROSS/BLUE SHIELD

## 2015-05-12 VITALS — BP 114/65 | HR 116 | Temp 98.0°F | Ht 68.0 in

## 2015-05-12 DIAGNOSIS — T451X5A Adverse effect of antineoplastic and immunosuppressive drugs, initial encounter: Secondary | ICD-10-CM | POA: Insufficient documentation

## 2015-05-12 DIAGNOSIS — R112 Nausea with vomiting, unspecified: Secondary | ICD-10-CM | POA: Insufficient documentation

## 2015-05-12 DIAGNOSIS — Z5111 Encounter for antineoplastic chemotherapy: Secondary | ICD-10-CM | POA: Diagnosis not present

## 2015-05-12 DIAGNOSIS — C569 Malignant neoplasm of unspecified ovary: Secondary | ICD-10-CM

## 2015-05-12 LAB — CBC WITH DIFFERENTIAL/PLATELET
BASO%: 0.1 % (ref 0.0–2.0)
BASOS ABS: 0 10*3/uL (ref 0.0–0.1)
EOS ABS: 0 10*3/uL (ref 0.0–0.5)
EOS%: 0.1 % (ref 0.0–7.0)
HCT: 33.6 % — ABNORMAL LOW (ref 34.8–46.6)
HEMOGLOBIN: 11.2 g/dL — AB (ref 11.6–15.9)
LYMPH#: 1.2 10*3/uL (ref 0.9–3.3)
LYMPH%: 12.1 % — ABNORMAL LOW (ref 14.0–49.7)
MCH: 31 pg (ref 25.1–34.0)
MCHC: 33.3 g/dL (ref 31.5–36.0)
MCV: 93.1 fL (ref 79.5–101.0)
MONO#: 0.3 10*3/uL (ref 0.1–0.9)
MONO%: 3 % (ref 0.0–14.0)
NEUT%: 84.7 % — ABNORMAL HIGH (ref 38.4–76.8)
NEUTROS ABS: 8.6 10*3/uL — AB (ref 1.5–6.5)
NRBC: 1 % — AB (ref 0–0)
PLATELETS: ADEQUATE 10*3/uL (ref 145–400)
RBC: 3.61 10*6/uL — AB (ref 3.70–5.45)
RDW: 18.3 % — AB (ref 11.2–14.5)
WBC: 10.2 10*3/uL (ref 3.9–10.3)

## 2015-05-12 LAB — COMPREHENSIVE METABOLIC PANEL
ALBUMIN: 3.8 g/dL (ref 3.5–5.0)
ALK PHOS: 97 U/L (ref 40–150)
ALT: 15 U/L (ref 0–55)
AST: 15 U/L (ref 5–34)
Anion Gap: 12 mEq/L — ABNORMAL HIGH (ref 3–11)
BUN: 13.7 mg/dL (ref 7.0–26.0)
CO2: 20 meq/L — AB (ref 22–29)
Calcium: 9.3 mg/dL (ref 8.4–10.4)
Chloride: 107 mEq/L (ref 98–109)
Creatinine: 0.8 mg/dL (ref 0.6–1.1)
EGFR: 78 mL/min/{1.73_m2} — AB (ref >90)
Glucose: 198 mg/dl — ABNORMAL HIGH (ref 70–140)
POTASSIUM: 4.3 meq/L (ref 3.5–5.1)
Sodium: 140 mEq/L (ref 136–145)
TOTAL PROTEIN: 7.3 g/dL (ref 6.4–8.3)

## 2015-05-12 MED ORDER — SODIUM CHLORIDE 0.9 % IV SOLN
Freq: Once | INTRAVENOUS | Status: AC
  Administered 2015-05-12: 14:00:00 via INTRAVENOUS
  Filled 2015-05-12: qty 4

## 2015-05-12 MED ORDER — SODIUM CHLORIDE 0.9 % IV SOLN
80.0000 mg/m2 | Freq: Once | INTRAVENOUS | Status: AC
  Administered 2015-05-12: 150 mg via INTRAVENOUS
  Filled 2015-05-12: qty 25

## 2015-05-12 MED ORDER — DIPHENHYDRAMINE HCL 50 MG/ML IJ SOLN
INTRAMUSCULAR | Status: AC
  Filled 2015-05-12: qty 1

## 2015-05-12 MED ORDER — FAMOTIDINE IN NACL 20-0.9 MG/50ML-% IV SOLN
INTRAVENOUS | Status: AC
  Filled 2015-05-12: qty 50

## 2015-05-12 MED ORDER — FAMOTIDINE IN NACL 20-0.9 MG/50ML-% IV SOLN
20.0000 mg | Freq: Once | INTRAVENOUS | Status: AC
  Administered 2015-05-12: 20 mg via INTRAVENOUS

## 2015-05-12 MED ORDER — SODIUM CHLORIDE 0.9 % IV SOLN
Freq: Once | INTRAVENOUS | Status: AC
  Administered 2015-05-12: 13:00:00 via INTRAVENOUS

## 2015-05-12 MED ORDER — DIPHENHYDRAMINE HCL 50 MG/ML IJ SOLN
25.0000 mg | Freq: Once | INTRAMUSCULAR | Status: AC
  Administered 2015-05-12: 25 mg via INTRAVENOUS

## 2015-05-12 NOTE — Patient Instructions (Signed)
[facility] Discharge Instructions for Patients Receiving Chemotherapy  Today you received the following chemotherapy agents taxol  To help prevent nausea and vomiting after your treatment, we encourage you to take your nausea medication as directed   If you develop nausea and vomiting that is not controlled by your nausea medication, call the clinic.   BELOW ARE SYMPTOMS THAT SHOULD BE REPORTED IMMEDIATELY:  *FEVER GREATER THAN 100.5 F  *CHILLS WITH OR WITHOUT FEVER  NAUSEA AND VOMITING THAT IS NOT CONTROLLED WITH YOUR NAUSEA MEDICATION  *UNUSUAL SHORTNESS OF BREATH  *UNUSUAL BRUISING OR BLEEDING  TENDERNESS IN MOUTH AND THROAT WITH OR WITHOUT PRESENCE OF ULCERS  *URINARY PROBLEMS  *BOWEL PROBLEMS  UNUSUAL RASH Items with * indicate a potential emergency and should be followed up as soon as possible.  Feel free to call the clinic you have any questions or concerns. The clinic phone number is [phone].  Please show the CHEMO ALERT CARD at check-in to the Emergency Department and triage nurse.   

## 2015-05-13 ENCOUNTER — Ambulatory Visit (HOSPITAL_BASED_OUTPATIENT_CLINIC_OR_DEPARTMENT_OTHER): Payer: BLUE CROSS/BLUE SHIELD

## 2015-05-13 VITALS — BP 103/50 | HR 97 | Temp 98.0°F

## 2015-05-13 DIAGNOSIS — C569 Malignant neoplasm of unspecified ovary: Secondary | ICD-10-CM

## 2015-05-13 DIAGNOSIS — Z5189 Encounter for other specified aftercare: Secondary | ICD-10-CM | POA: Diagnosis not present

## 2015-05-13 MED ORDER — TBO-FILGRASTIM 480 MCG/0.8ML ~~LOC~~ SOSY
480.0000 ug | PREFILLED_SYRINGE | Freq: Once | SUBCUTANEOUS | Status: AC
  Administered 2015-05-13: 480 ug via SUBCUTANEOUS
  Filled 2015-05-13: qty 0.8

## 2015-05-18 ENCOUNTER — Other Ambulatory Visit: Payer: Self-pay | Admitting: Oncology

## 2015-05-18 ENCOUNTER — Telehealth: Payer: Self-pay

## 2015-05-18 DIAGNOSIS — C569 Malignant neoplasm of unspecified ovary: Secondary | ICD-10-CM

## 2015-05-18 MED ORDER — PROCHLORPERAZINE MALEATE 10 MG PO TABS: 10.0000 mg | ORAL_TABLET | Freq: Four times a day (QID) | ORAL | Status: DC | PRN

## 2015-05-18 NOTE — Telephone Encounter (Signed)
-----   Message from Tora Kindred, [facility] sent at 05/18/2015  1:03 PM EST ----- Regarding: RE: Antiemetics for tomorrow (2/21) Ok, great! Thanks [employer].   I changed orders and we'll talk to patient tomorrow.  I have a handout prepared and in her chart to give her to help her remember how to use her Zofran. Vergia Alcon ----- Message -----    From: Milon Dikes    Sent: 05/18/2015  12:09 PM      To: Tora Kindred, RPH, Baruch Merl, RN, # Subject: RE: Antiemetics for tomorrow (2/21)            The aloxi is approved ----- Message -----    From: Gordy Levan, MD    Sent: 05/18/2015  11:36 AM      To: Tora Kindred, RPH, Milon Dikes, # Subject: FW: Antiemetics for tomorrow (2/21)            If Charlena Cross can get Aloxi approved for treatment on 2-22, that would be fine to add Aloxi to that day (day 1 cycle 4) She does not need Aloxi day 8 or day 15.  Will need pharmacist to do teaching for antiemetics day of chemo if so. Note patient does not always understand instructions well.  If Aloxi, RN please send script for Compazine 10 mg q 6 hr prn, let patient know there is new script due to change in nausea meds with chemo, let her know pharmacist will explain just how to use meds when she is at treatment. She should pick up compazine before treatment.  Thank you Lennis  ----- Message -----    From: Tora Kindred, [facility]    Sent: 05/18/2015  11:19 AM      To: Gordy Levan, MD, [facility] Subject: Antiemetics for tomorrow (2/21)                Dr. Marko Plume, I noticed [name] had delayed CINV w/ cycle 3 Carbo/Taxol. This is a careplan that was changed w/ addition of Aloxi.  Her orders have not changed because they were already entered at the time of the conversion to Aloxi. I wanted to let you know that to better manage pts delayed CINV you could consider changing her Zofran to Aloxi on day 1 of cycles 4-6. If you decide to use Aloxi, please consider adding Compazine or  Phenergan for control of breakthrough CINV to use before day 3 (when she can begin the PO Zofran). If you need help with these orders, let pharmacy know. We can also go speak to pt if you switch to Aloxi so we can explain use of other antiemetics & Zofran. Vergia Alcon

## 2015-05-18 NOTE — Telephone Encounter (Signed)
Left message in Shelley Sanchez's voice mail noteing the change in her antiemetics as noted below. Stated that the pharmacist will review how to take nausea meds after treatment. Compazine prescription sent to her [facility] pharmacy.

## 2015-05-19 ENCOUNTER — Ambulatory Visit (HOSPITAL_BASED_OUTPATIENT_CLINIC_OR_DEPARTMENT_OTHER): Payer: BLUE CROSS/BLUE SHIELD

## 2015-05-19 ENCOUNTER — Other Ambulatory Visit (HOSPITAL_BASED_OUTPATIENT_CLINIC_OR_DEPARTMENT_OTHER): Payer: BLUE CROSS/BLUE SHIELD

## 2015-05-19 VITALS — BP 107/66 | HR 102 | Temp 98.2°F | Resp 19

## 2015-05-19 DIAGNOSIS — C561 Malignant neoplasm of right ovary: Secondary | ICD-10-CM

## 2015-05-19 DIAGNOSIS — Z5111 Encounter for antineoplastic chemotherapy: Secondary | ICD-10-CM

## 2015-05-19 DIAGNOSIS — C569 Malignant neoplasm of unspecified ovary: Secondary | ICD-10-CM

## 2015-05-19 LAB — CBC WITH DIFFERENTIAL/PLATELET
BASO%: 0.2 % (ref 0.0–2.0)
BASOS ABS: 0 10*3/uL (ref 0.0–0.1)
EOS ABS: 0 10*3/uL (ref 0.0–0.5)
EOS%: 0 % (ref 0.0–7.0)
HCT: 35 % (ref 34.8–46.6)
HGB: 11.7 g/dL (ref 11.6–15.9)
LYMPH%: 10.9 % — AB (ref 14.0–49.7)
MCH: 31 pg (ref 25.1–34.0)
MCHC: 33.4 g/dL (ref 31.5–36.0)
MCV: 92.8 fL (ref 79.5–101.0)
MONO#: 0 10*3/uL — ABNORMAL LOW (ref 0.1–0.9)
MONO%: 0.2 % (ref 0.0–14.0)
NEUT#: 4.4 10*3/uL (ref 1.5–6.5)
NEUT%: 88.7 % — AB (ref 38.4–76.8)
PLATELETS: 256 10*3/uL (ref 145–400)
RBC: 3.77 10*6/uL (ref 3.70–5.45)
RDW: 19.1 % — ABNORMAL HIGH (ref 11.2–14.5)
WBC: 4.9 10*3/uL (ref 3.9–10.3)
lymph#: 0.5 10*3/uL — ABNORMAL LOW (ref 0.9–3.3)
nRBC: 2 % — ABNORMAL HIGH (ref 0–0)

## 2015-05-19 LAB — COMPREHENSIVE METABOLIC PANEL
ALT: 19 U/L (ref 0–55)
ANION GAP: 11 meq/L (ref 3–11)
AST: 13 U/L (ref 5–34)
Albumin: 3.7 g/dL (ref 3.5–5.0)
Alkaline Phosphatase: 82 U/L (ref 40–150)
BUN: 18.1 mg/dL (ref 7.0–26.0)
CHLORIDE: 105 meq/L (ref 98–109)
CO2: 21 meq/L — AB (ref 22–29)
Calcium: 9.1 mg/dL (ref 8.4–10.4)
Creatinine: 1.3 mg/dL — ABNORMAL HIGH (ref 0.6–1.1)
EGFR: 46 mL/min/{1.73_m2} — AB (ref >90)
Glucose: 155 mg/dl — ABNORMAL HIGH (ref 70–140)
POTASSIUM: 4.5 meq/L (ref 3.5–5.1)
SODIUM: 138 meq/L (ref 136–145)
TOTAL PROTEIN: 7 g/dL (ref 6.4–8.3)

## 2015-05-19 MED ORDER — SODIUM CHLORIDE 0.9 % IV SOLN
Freq: Once | INTRAVENOUS | Status: AC
  Administered 2015-05-19: 14:00:00 via INTRAVENOUS

## 2015-05-19 MED ORDER — PALONOSETRON HCL INJECTION 0.25 MG/5ML
0.2500 mg | Freq: Once | INTRAVENOUS | Status: AC
  Administered 2015-05-19: 0.25 mg via INTRAVENOUS

## 2015-05-19 MED ORDER — SODIUM CHLORIDE 0.9 % IV SOLN
10.0000 mg | Freq: Once | INTRAVENOUS | Status: AC
  Administered 2015-05-19: 10 mg via INTRAVENOUS
  Filled 2015-05-19: qty 1

## 2015-05-19 MED ORDER — FAMOTIDINE IN NACL 20-0.9 MG/50ML-% IV SOLN
INTRAVENOUS | Status: AC
Start: 2015-05-19 — End: 2015-05-19
  Filled 2015-05-19: qty 50

## 2015-05-19 MED ORDER — DIPHENHYDRAMINE HCL 50 MG/ML IJ SOLN
25.0000 mg | Freq: Once | INTRAMUSCULAR | Status: AC
  Administered 2015-05-19: 25 mg via INTRAVENOUS

## 2015-05-19 MED ORDER — CARBOPLATIN CHEMO INJECTION 600 MG/60ML
400.0000 mg | Freq: Once | INTRAVENOUS | Status: AC
  Administered 2015-05-19: 400 mg via INTRAVENOUS
  Filled 2015-05-19: qty 40

## 2015-05-19 MED ORDER — FAMOTIDINE IN NACL 20-0.9 MG/50ML-% IV SOLN
20.0000 mg | Freq: Once | INTRAVENOUS | Status: AC
  Administered 2015-05-19: 20 mg via INTRAVENOUS

## 2015-05-19 MED ORDER — DIPHENHYDRAMINE HCL 50 MG/ML IJ SOLN
INTRAMUSCULAR | Status: AC
  Filled 2015-05-19: qty 1

## 2015-05-19 MED ORDER — SODIUM CHLORIDE 0.9 % IV SOLN
80.0000 mg/m2 | Freq: Once | INTRAVENOUS | Status: AC
  Administered 2015-05-19: 150 mg via INTRAVENOUS
  Filled 2015-05-19: qty 25

## 2015-05-19 MED ORDER — CARBOPLATIN CHEMO INTRADERMAL TEST DOSE 100MCG/0.02ML
100.0000 ug | Freq: Once | INTRADERMAL | Status: AC
  Administered 2015-05-19: 100 ug via INTRADERMAL
  Filled 2015-05-19: qty 0.01

## 2015-05-19 MED ORDER — PALONOSETRON HCL INJECTION 0.25 MG/5ML
INTRAVENOUS | Status: AC
  Filled 2015-05-19: qty 5

## 2015-05-19 NOTE — Progress Notes (Signed)
Pt spoke with pharmacy regarding new medication Aloxi being given during treatment to help with nausea during and after chemo. Pt verbalized understanding on what nausea medication to take at home.

## 2015-05-19 NOTE — Patient Instructions (Signed)
[facility] Discharge Instructions for Patients Receiving Chemotherapy  Today you received the following chemotherapy agents taxol/carboplatin  To help prevent nausea and vomiting after your treatment, we encourage you to take your nausea medication as directed   If you develop nausea and vomiting that is not controlled by your nausea medication, call the clinic.   BELOW ARE SYMPTOMS THAT SHOULD BE REPORTED IMMEDIATELY:  *FEVER GREATER THAN 100.5 F  *CHILLS WITH OR WITHOUT FEVER  NAUSEA AND VOMITING THAT IS NOT CONTROLLED WITH YOUR NAUSEA MEDICATION  *UNUSUAL SHORTNESS OF BREATH  *UNUSUAL BRUISING OR BLEEDING  TENDERNESS IN MOUTH AND THROAT WITH OR WITHOUT PRESENCE OF ULCERS  *URINARY PROBLEMS  *BOWEL PROBLEMS  UNUSUAL RASH Items with * indicate a potential emergency and should be followed up as soon as possible.  Feel free to call the clinic you have any questions or concerns. The clinic phone number is [phone].  

## 2015-05-20 ENCOUNTER — Ambulatory Visit (HOSPITAL_BASED_OUTPATIENT_CLINIC_OR_DEPARTMENT_OTHER): Payer: BLUE CROSS/BLUE SHIELD

## 2015-05-20 VITALS — BP 125/86 | HR 91 | Temp 98.0°F

## 2015-05-20 DIAGNOSIS — Z5189 Encounter for other specified aftercare: Secondary | ICD-10-CM | POA: Diagnosis not present

## 2015-05-20 DIAGNOSIS — C561 Malignant neoplasm of right ovary: Secondary | ICD-10-CM | POA: Diagnosis not present

## 2015-05-20 DIAGNOSIS — C569 Malignant neoplasm of unspecified ovary: Secondary | ICD-10-CM

## 2015-05-20 MED ORDER — TBO-FILGRASTIM 480 MCG/0.8ML ~~LOC~~ SOSY
480.0000 ug | PREFILLED_SYRINGE | Freq: Once | SUBCUTANEOUS | Status: AC
  Administered 2015-05-20: 480 ug via SUBCUTANEOUS
  Filled 2015-05-20: qty 0.8

## 2015-05-21 ENCOUNTER — Ambulatory Visit (HOSPITAL_BASED_OUTPATIENT_CLINIC_OR_DEPARTMENT_OTHER): Payer: BLUE CROSS/BLUE SHIELD

## 2015-05-21 ENCOUNTER — Telehealth: Payer: Self-pay

## 2015-05-21 VITALS — BP 114/59 | HR 104 | Temp 97.8°F

## 2015-05-21 DIAGNOSIS — Z5189 Encounter for other specified aftercare: Secondary | ICD-10-CM | POA: Diagnosis not present

## 2015-05-21 DIAGNOSIS — C561 Malignant neoplasm of right ovary: Secondary | ICD-10-CM | POA: Diagnosis not present

## 2015-05-21 DIAGNOSIS — C569 Malignant neoplasm of unspecified ovary: Secondary | ICD-10-CM

## 2015-05-21 MED ORDER — TBO-FILGRASTIM 480 MCG/0.8ML ~~LOC~~ SOSY
480.0000 ug | PREFILLED_SYRINGE | Freq: Once | SUBCUTANEOUS | Status: AC
Start: 2015-05-21 — End: 2015-05-21
  Administered 2015-05-21: 480 ug via SUBCUTANEOUS
  Filled 2015-05-21: qty 0.8

## 2015-05-21 NOTE — Telephone Encounter (Signed)
LM in patient's vm stating the results of her Creatine from lab on 05-19-15 and the need for increasing fluids as noted below by Dr. Marko Plume. She needs to drink at least 64 oz of caffeine free fluid a day. She can call the office if she has any questions or concerns at [phone].

## 2015-05-21 NOTE — Telephone Encounter (Signed)
-----   Message from Gordy Levan, MD sent at 05/21/2015  8:17 AM EST ----- Labs seen and need follow up: needs to push fluids please

## 2015-05-25 ENCOUNTER — Telehealth: Payer: Self-pay

## 2015-05-25 DIAGNOSIS — C561 Malignant neoplasm of right ovary: Secondary | ICD-10-CM

## 2015-05-25 MED ORDER — DEXAMETHASONE 4 MG PO TABS: ORAL_TABLET | ORAL | Status: DC

## 2015-05-25 NOTE — Telephone Encounter (Signed)
Pt called requesting dexamethasone refill. Called clifton mcdaniel back b/c Kenzlie's mailbox was full. Dex refilled.

## 2015-05-26 ENCOUNTER — Ambulatory Visit (HOSPITAL_BASED_OUTPATIENT_CLINIC_OR_DEPARTMENT_OTHER): Payer: BLUE CROSS/BLUE SHIELD

## 2015-05-26 ENCOUNTER — Other Ambulatory Visit (HOSPITAL_BASED_OUTPATIENT_CLINIC_OR_DEPARTMENT_OTHER): Payer: BLUE CROSS/BLUE SHIELD

## 2015-05-26 VITALS — BP 100/51 | HR 107 | Temp 98.2°F | Resp 18

## 2015-05-26 DIAGNOSIS — C569 Malignant neoplasm of unspecified ovary: Secondary | ICD-10-CM

## 2015-05-26 DIAGNOSIS — C561 Malignant neoplasm of right ovary: Secondary | ICD-10-CM | POA: Diagnosis not present

## 2015-05-26 DIAGNOSIS — Z5111 Encounter for antineoplastic chemotherapy: Secondary | ICD-10-CM | POA: Diagnosis not present

## 2015-05-26 LAB — COMPREHENSIVE METABOLIC PANEL
ALBUMIN: 3.7 g/dL (ref 3.5–5.0)
ALK PHOS: 83 U/L (ref 40–150)
ALT: 20 U/L (ref 0–55)
AST: 16 U/L (ref 5–34)
Anion Gap: 10 mEq/L (ref 3–11)
BUN: 13 mg/dL (ref 7.0–26.0)
CALCIUM: 9.2 mg/dL (ref 8.4–10.4)
CO2: 25 mEq/L (ref 22–29)
Chloride: 103 mEq/L (ref 98–109)
Creatinine: 0.8 mg/dL (ref 0.6–1.1)
EGFR: 82 mL/min/{1.73_m2} — AB (ref >90)
GLUCOSE: 178 mg/dL — AB (ref 70–140)
Potassium: 4.4 mEq/L (ref 3.5–5.1)
Sodium: 138 mEq/L (ref 136–145)
TOTAL PROTEIN: 6.8 g/dL (ref 6.4–8.3)

## 2015-05-26 LAB — CBC WITH DIFFERENTIAL/PLATELET
BASO%: 0.3 % (ref 0.0–2.0)
BASOS ABS: 0 10*3/uL (ref 0.0–0.1)
EOS ABS: 0 10*3/uL (ref 0.0–0.5)
EOS%: 0 % (ref 0.0–7.0)
HEMATOCRIT: 34 % — AB (ref 34.8–46.6)
HEMOGLOBIN: 11.2 g/dL — AB (ref 11.6–15.9)
LYMPH#: 0.7 10*3/uL — AB (ref 0.9–3.3)
LYMPH%: 10.6 % — ABNORMAL LOW (ref 14.0–49.7)
MCH: 31.4 pg (ref 25.1–34.0)
MCHC: 32.9 g/dL (ref 31.5–36.0)
MCV: 95.6 fL (ref 79.5–101.0)
MONO#: 0.1 10*3/uL (ref 0.1–0.9)
MONO%: 1.1 % (ref 0.0–14.0)
NEUT%: 88 % — ABNORMAL HIGH (ref 38.4–76.8)
NEUTROS ABS: 6.2 10*3/uL (ref 1.5–6.5)
PLATELETS: 305 10*3/uL (ref 145–400)
RBC: 3.55 10*6/uL — ABNORMAL LOW (ref 3.70–5.45)
RDW: 19.9 % — AB (ref 11.2–14.5)
WBC: 7.1 10*3/uL (ref 3.9–10.3)

## 2015-05-26 MED ORDER — SODIUM CHLORIDE 0.9 % IV SOLN
80.0000 mg/m2 | Freq: Once | INTRAVENOUS | Status: AC
  Administered 2015-05-26: 150 mg via INTRAVENOUS
  Filled 2015-05-26: qty 25

## 2015-05-26 MED ORDER — SODIUM CHLORIDE 0.9 % IV SOLN
Freq: Once | INTRAVENOUS | Status: AC
  Administered 2015-05-26: 14:00:00 via INTRAVENOUS

## 2015-05-26 MED ORDER — DIPHENHYDRAMINE HCL 50 MG/ML IJ SOLN
INTRAMUSCULAR | Status: AC
  Filled 2015-05-26: qty 1

## 2015-05-26 MED ORDER — DIPHENHYDRAMINE HCL 50 MG/ML IJ SOLN
25.0000 mg | Freq: Once | INTRAMUSCULAR | Status: AC
  Administered 2015-05-26: 25 mg via INTRAVENOUS

## 2015-05-26 MED ORDER — SODIUM CHLORIDE 0.9 % IV SOLN
Freq: Once | INTRAVENOUS | Status: AC
  Administered 2015-05-26: 14:00:00 via INTRAVENOUS
  Filled 2015-05-26: qty 4

## 2015-05-26 MED ORDER — FAMOTIDINE IN NACL 20-0.9 MG/50ML-% IV SOLN
INTRAVENOUS | Status: AC
  Filled 2015-05-26: qty 50

## 2015-05-26 MED ORDER — FAMOTIDINE IN NACL 20-0.9 MG/50ML-% IV SOLN
20.0000 mg | Freq: Once | INTRAVENOUS | Status: AC
  Administered 2015-05-26: 20 mg via INTRAVENOUS

## 2015-05-26 NOTE — Patient Instructions (Signed)
[facility] Discharge Instructions for Patients Receiving Chemotherapy  Today you received the following chemotherapy agents taxol  To help prevent nausea and vomiting after your treatment, we encourage you to take your nausea medication as directed   If you develop nausea and vomiting that is not controlled by your nausea medication, call the clinic.   BELOW ARE SYMPTOMS THAT SHOULD BE REPORTED IMMEDIATELY:  *FEVER GREATER THAN 100.5 F  *CHILLS WITH OR WITHOUT FEVER  NAUSEA AND VOMITING THAT IS NOT CONTROLLED WITH YOUR NAUSEA MEDICATION  *UNUSUAL SHORTNESS OF BREATH  *UNUSUAL BRUISING OR BLEEDING  TENDERNESS IN MOUTH AND THROAT WITH OR WITHOUT PRESENCE OF ULCERS  *URINARY PROBLEMS  *BOWEL PROBLEMS  UNUSUAL RASH Items with * indicate a potential emergency and should be followed up as soon as possible.  Feel free to call the clinic you have any questions or concerns. The clinic phone number is [phone].  Please show the CHEMO ALERT CARD at check-in to the Emergency Department and triage nurse.   

## 2015-05-27 ENCOUNTER — Ambulatory Visit (HOSPITAL_BASED_OUTPATIENT_CLINIC_OR_DEPARTMENT_OTHER): Payer: BLUE CROSS/BLUE SHIELD

## 2015-05-27 VITALS — BP 104/60 | HR 77 | Temp 98.3°F

## 2015-05-27 DIAGNOSIS — Z5189 Encounter for other specified aftercare: Secondary | ICD-10-CM | POA: Diagnosis not present

## 2015-05-27 DIAGNOSIS — C561 Malignant neoplasm of right ovary: Secondary | ICD-10-CM

## 2015-05-27 DIAGNOSIS — C569 Malignant neoplasm of unspecified ovary: Secondary | ICD-10-CM

## 2015-05-27 MED ORDER — TBO-FILGRASTIM 480 MCG/0.8ML ~~LOC~~ SOSY
480.0000 ug | PREFILLED_SYRINGE | Freq: Once | SUBCUTANEOUS | Status: AC
  Administered 2015-05-27: 480 ug via SUBCUTANEOUS
  Filled 2015-05-27: qty 0.8

## 2015-05-30 ENCOUNTER — Other Ambulatory Visit: Payer: Self-pay | Admitting: Oncology

## 2015-05-31 ENCOUNTER — Telehealth: Payer: Self-pay | Admitting: Oncology

## 2015-05-31 ENCOUNTER — Other Ambulatory Visit (HOSPITAL_BASED_OUTPATIENT_CLINIC_OR_DEPARTMENT_OTHER): Payer: BLUE CROSS/BLUE SHIELD

## 2015-05-31 ENCOUNTER — Encounter: Payer: Self-pay | Admitting: Oncology

## 2015-05-31 ENCOUNTER — Ambulatory Visit (HOSPITAL_BASED_OUTPATIENT_CLINIC_OR_DEPARTMENT_OTHER): Payer: BLUE CROSS/BLUE SHIELD | Admitting: Oncology

## 2015-05-31 VITALS — BP 127/63 | HR 86 | Temp 98.0°F | Resp 18 | Ht 68.0 in | Wt 172.5 lb

## 2015-05-31 DIAGNOSIS — D701 Agranulocytosis secondary to cancer chemotherapy: Secondary | ICD-10-CM

## 2015-05-31 DIAGNOSIS — C569 Malignant neoplasm of unspecified ovary: Secondary | ICD-10-CM | POA: Diagnosis not present

## 2015-05-31 DIAGNOSIS — G622 Polyneuropathy due to other toxic agents: Secondary | ICD-10-CM

## 2015-05-31 DIAGNOSIS — R110 Nausea: Secondary | ICD-10-CM

## 2015-05-31 DIAGNOSIS — C7989 Secondary malignant neoplasm of other specified sites: Secondary | ICD-10-CM

## 2015-05-31 DIAGNOSIS — T451X5A Adverse effect of antineoplastic and immunosuppressive drugs, initial encounter: Secondary | ICD-10-CM

## 2015-05-31 DIAGNOSIS — C7889 Secondary malignant neoplasm of other digestive organs: Secondary | ICD-10-CM

## 2015-05-31 DIAGNOSIS — Z9081 Acquired absence of spleen: Secondary | ICD-10-CM

## 2015-05-31 DIAGNOSIS — C561 Malignant neoplasm of right ovary: Secondary | ICD-10-CM

## 2015-05-31 LAB — CBC WITH DIFFERENTIAL/PLATELET
BASO%: 0.5 % (ref 0.0–2.0)
BASOS ABS: 0 10*3/uL (ref 0.0–0.1)
EOS%: 0.7 % (ref 0.0–7.0)
Eosinophils Absolute: 0 10*3/uL (ref 0.0–0.5)
HEMATOCRIT: 34.3 % — AB (ref 34.8–46.6)
HGB: 11.4 g/dL — ABNORMAL LOW (ref 11.6–15.9)
LYMPH#: 2.6 10*3/uL (ref 0.9–3.3)
LYMPH%: 43 % (ref 14.0–49.7)
MCH: 32 pg (ref 25.1–34.0)
MCHC: 33.2 g/dL (ref 31.5–36.0)
MCV: 96.3 fL (ref 79.5–101.0)
MONO#: 0.4 10*3/uL (ref 0.1–0.9)
MONO%: 6.7 % (ref 0.0–14.0)
NEUT#: 2.9 10*3/uL (ref 1.5–6.5)
NEUT%: 49.1 % (ref 38.4–76.8)
NRBC: 4 % — AB (ref 0–0)
Platelets: 419 10*3/uL — ABNORMAL HIGH (ref 145–400)
RBC: 3.56 10*6/uL — ABNORMAL LOW (ref 3.70–5.45)
RDW: 20.8 % — AB (ref 11.2–14.5)
WBC: 6 10*3/uL (ref 3.9–10.3)

## 2015-05-31 LAB — COMPREHENSIVE METABOLIC PANEL
ALBUMIN: 4 g/dL (ref 3.5–5.0)
ALK PHOS: 86 U/L (ref 40–150)
ALT: 19 U/L (ref 0–55)
AST: 17 U/L (ref 5–34)
Anion Gap: 10 mEq/L (ref 3–11)
BUN: 19.1 mg/dL (ref 7.0–26.0)
CALCIUM: 9.8 mg/dL (ref 8.4–10.4)
CHLORIDE: 106 meq/L (ref 98–109)
CO2: 26 mEq/L (ref 22–29)
CREATININE: 0.9 mg/dL (ref 0.6–1.1)
EGFR: 72 mL/min/{1.73_m2} — ABNORMAL LOW (ref >90)
GLUCOSE: 99 mg/dL (ref 70–140)
Potassium: 4.8 mEq/L (ref 3.5–5.1)
SODIUM: 142 meq/L (ref 136–145)
Total Bilirubin: 0.31 mg/dL (ref 0.20–1.20)
Total Protein: 7.2 g/dL (ref 6.4–8.3)

## 2015-05-31 NOTE — Telephone Encounter (Signed)
appt made and avs printed. Ct to be scheduled by central radiology °

## 2015-05-31 NOTE — Progress Notes (Signed)
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  We plan repeat scans after cycle 4. She saw Dr Skeet Latch 04-29-15 and will see her again in May.  Patient had much less nausea with addition of aloxi to day 1 cycle 4. She has no peripheral neuropathy. Counts maintaining now with granix. She has preferred peripheral IV access to PAC. She denies fever or symptoms of infection, abdominal or pelvic pain, respiratory symptoms. Bowels are moving well with daily miralax. She has no LE swelling. No bleeding. Areas of sun damage face, extremities, upper trunk more noticeable since on chemo, now using topical from dermatology. She has gotten a treadmill, is looking forward to exercising regularly. Remainder of 10 point Review of Systems negative.   Flu vaccine 01-19-15 No PAC. All of previous chemo done peripheral Genetics testing normal 08-2013 ([employer] panel) Pneumovax, meningococcal vaccine and Hemophilus influenza all 01-14-15 Splenectomy 02-02-15 CA 125 was 2418 at presentation 03-2012 and 88 on 01-04-15 prior to splenectomy  Tuesdays are her day off from work, prefers appointments then as possible.  ONCOLOGIC HISTORY Patient was in usual excellent health thru Sept 2013,then in Oct 2013 more fatigued, with some fecal urge incontinence and abdominal fullness such that she began to diet to lose weight. By Marcene Duos she could not climb one flight of stairs and could walk only from office to car without SOB and coughing. She was seen at urgent care in Dec with bilateral pleural effusions on CXR, referred to Dr Clois Comber, with 1.6 liter right thoracentesis done 03-11-12 (no path in  this EMR). She had CT CAP and CT angio chest with large bilateral pleural effusions, no PE, ascites and pelvic mass. She had paracentesis for 1.4 liters on 04-02-12 and left thoracentesis for 2 liters on 04-05-12. CA 125 was 2418. She was seen in consultation by Dr Skeet Latch, with TAH/BSO, radical debulking and omentectomy on 04-09-2012, which was optimal debulking with only residual at completion of procedure being 7 mm area on posterior surface of right lobe liver. She was transfused 2 units PRBCs on 04-10-12 for Hgb down to 7.2 (from 12 preop). Pathology [id number]) high grade serous carcinoma involving right pelvic mass, serosa of uterus and peritoneum, omentum negative, no nodes submitted, felt IIIC right ovarian primary. Adjuvant therapy with dose dense taxol/ carboplatin began 05-03-12, with CA125 down to 30 by early April. Cycle 6 was completed 09-10-12. CA 125 remained in good range, including 14 in 03-2009 and 25 on 09-24-14, however was 88 in early Oct 2016. CT CAP 01-11-15 found mass at anterior aspect of spleen 11.5 x 8.2 x 13 cm, with stable unremarkable chest, no ascites, no adenopathy, no pelvic masses. She had splenectomy by Dr Barry Dienes 02-02-15, pathology [phone]) high grade serous carcinoma involving splenic capsule and parenchyma, 9 cm. There was no visualized abdominal carcinomatosis; tail of pancreas was adherent to mass and taken with spleen. She began dose dense carbo taxol on 03-16-15, required addition of granix for leukopenia  Objective:  Vital signs in last 24 hours:  BP 127/63 mmHg  Pulse 86  Temp(Src) 98 F (36.7 C) (Oral)  Resp 18  Ht '5\' 8"'  (1.727 m)  Wt 172  lb 8 oz (78.245 kg)  BMI 26.23 kg/m2  SpO2 99%  weight up 1 lb Alert, oriented and appropriate. Ambulatory without difficulty.  Partial alopecia  HEENT:PERRL, sclerae not icteric. Oral mucosa moist without lesions, posterior pharynx clear.  Neck supple. No JVD.  Lymphatics:no cervical,supraclavicular, axillary or  inguinal adenopathy Resp: clear to auscultation bilaterally and normal percussion bilaterally Cardio: regular rate and rhythm. No gallop. GI: soft, nontender, not distended, no mass or organomegaly. Normally active bowel sounds. Surgical incisions midline and LUQ not remarkable. Musculoskeletal/ Extremities: without pitting edema, cords, tenderness Neuro: no peripheral neuropathy. Otherwise nonfocal Skin erythematous areas of sun damage as noted, otherwise without ecchymosis, petechiae   Lab Results:  Results for orders placed or performed in visit on 05/31/15  CBC with Differential  Result Value Ref Range   WBC 6.0 3.9 - 10.3 10e3/uL   NEUT# 2.9 1.5 - 6.5 10e3/uL   HGB 11.4 (L) 11.6 - 15.9 g/dL   HCT 34.3 (L) 34.8 - 46.6 %   Platelets 419 (H) 145 - 400 10e3/uL   MCV 96.3 79.5 - 101.0 fL   MCH 32.0 25.1 - 34.0 pg   MCHC 33.2 31.5 - 36.0 g/dL   RBC 3.56 (L) 3.70 - 5.45 10e6/uL   RDW 20.8 (H) 11.2 - 14.5 %   lymph# 2.6 0.9 - 3.3 10e3/uL   MONO# 0.4 0.1 - 0.9 10e3/uL   Eosinophils Absolute 0.0 0.0 - 0.5 10e3/uL   Basophils Absolute 0.0 0.0 - 0.1 10e3/uL   NEUT% 49.1 38.4 - 76.8 %   LYMPH% 43.0 14.0 - 49.7 %   MONO% 6.7 0.0 - 14.0 %   EOS% 0.7 0.0 - 7.0 %   BASO% 0.5 0.0 - 2.0 %   nRBC 4 (H) 0 - 0 %  Comprehensive metabolic panel  Result Value Ref Range   Sodium 142 136 - 145 mEq/L   Potassium 4.8 3.5 - 5.1 mEq/L   Chloride 106 98 - 109 mEq/L   CO2 26 22 - 29 mEq/L   Glucose 99 70 - 140 mg/dl   BUN 19.1 7.0 - 26.0 mg/dL   Creatinine 0.9 0.6 - 1.1 mg/dL   Total Bilirubin 0.31 0.20 - 1.20 mg/dL   Alkaline Phosphatase 86 40 - 150 U/L   AST 17 5 - 34 U/L   ALT 19 0 - 55 U/L   Total Protein 7.2 6.4 - 8.3 g/dL   Albumin 4.0 3.5 - 5.0 g/dL   Calcium 9.8 8.4 - 10.4 mg/dL   Anion Gap 10 3 - 11 mEq/L   EGFR 72 (L) >90 ml/min/1.73 m2   CA 125 on 05-10-15 was 26 by new lab method (compared with 27.9 by that method 04-14-15) and 35 by prior lab method, compared with 34 on 04-14-15 and  51 on 03-11-15.  Studies/Results:  No results found.  Medications: I have reviewed the patient's current medications. Note Aloxi very helpful day 1 cycle 4.   DISCUSSION:  Clinically doing well, tho obviously at high risk for residual or progressive disease, and understands that she may need additional chemo depending on results of scans pending, tho she is looking forward to a break at least for next couple of weeks. I will see her back after scans and will stay in touch with Dr Skeet Latch prior to her next visit.   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 Recurrence with 9 cm mass in splenic hilum at splenectomy with resection of involved pancreatic tail 02-02-15, CA 125 elevated at 88 preoperatively, now in normal range. To complete 4 cycles dose dense carbo taxol on 06-02-15 (with carbo test dose and granix), then restaging scans and follow up visit. WIll watch CA 125 ongoing, as this has been good marker.  2.Post splenectomy: vaccines given 12-2014. She knows to be in touch with MD immediately if fever/ infectious illness, as would need antibiotics if so. 3. TFTs ok by PCP 4. mammograms up to date and not remarkable 5. Has not had colonoscopy. Referral to GI previously, defer this until after chemotherapy 6.slight preexisting peripheral neuropathy fingertips, not particularly worse with previous taxol.  Neuropathy so far has not increased with additional taxol. 7. 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 12.chemo neutropenia: will increase granix dose to 480 mg and hopefully use this on days 2,3,9,16 13.chemo nausea much better with addition of aloxi day 1 cycle 4  All questions answered. Chemo and granix orders confirmed. CT AP in 2-3 weeks then follow up with MD. Time spent 30 min including >50% counseling and coordination of care. Cc PCP   Gordy Levan, MD   05/31/2015, 5:34 PM

## 2015-06-01 DIAGNOSIS — R110 Nausea: Secondary | ICD-10-CM | POA: Insufficient documentation

## 2015-06-01 DIAGNOSIS — T451X5A Adverse effect of antineoplastic and immunosuppressive drugs, initial encounter: Secondary | ICD-10-CM | POA: Insufficient documentation

## 2015-06-02 ENCOUNTER — Ambulatory Visit (HOSPITAL_BASED_OUTPATIENT_CLINIC_OR_DEPARTMENT_OTHER): Payer: BLUE CROSS/BLUE SHIELD

## 2015-06-02 ENCOUNTER — Other Ambulatory Visit (HOSPITAL_BASED_OUTPATIENT_CLINIC_OR_DEPARTMENT_OTHER): Payer: BLUE CROSS/BLUE SHIELD

## 2015-06-02 VITALS — BP 118/67 | HR 113 | Temp 98.9°F | Resp 18

## 2015-06-02 DIAGNOSIS — C569 Malignant neoplasm of unspecified ovary: Secondary | ICD-10-CM

## 2015-06-02 DIAGNOSIS — Z5111 Encounter for antineoplastic chemotherapy: Secondary | ICD-10-CM

## 2015-06-02 DIAGNOSIS — C561 Malignant neoplasm of right ovary: Secondary | ICD-10-CM

## 2015-06-02 LAB — COMPREHENSIVE METABOLIC PANEL
ALT: 18 U/L (ref 0–55)
ANION GAP: 12 meq/L — AB (ref 3–11)
AST: 15 U/L (ref 5–34)
Albumin: 3.8 g/dL (ref 3.5–5.0)
Alkaline Phosphatase: 87 U/L (ref 40–150)
BUN: 16.4 mg/dL (ref 7.0–26.0)
CHLORIDE: 107 meq/L (ref 98–109)
CO2: 21 meq/L — AB (ref 22–29)
Calcium: 9.4 mg/dL (ref 8.4–10.4)
Creatinine: 0.9 mg/dL (ref 0.6–1.1)
EGFR: 74 mL/min/{1.73_m2} — AB (ref >90)
Glucose: 180 mg/dl — ABNORMAL HIGH (ref 70–140)
Potassium: 4.1 mEq/L (ref 3.5–5.1)
Sodium: 140 mEq/L (ref 136–145)
Total Protein: 7.2 g/dL (ref 6.4–8.3)

## 2015-06-02 LAB — CBC WITH DIFFERENTIAL/PLATELET
BASO%: 0.3 % (ref 0.0–2.0)
BASOS ABS: 0 10*3/uL (ref 0.0–0.1)
EOS ABS: 0 10*3/uL (ref 0.0–0.5)
EOS%: 0 % (ref 0.0–7.0)
HCT: 33.1 % — ABNORMAL LOW (ref 34.8–46.6)
HGB: 11.1 g/dL — ABNORMAL LOW (ref 11.6–15.9)
LYMPH%: 11.7 % — AB (ref 14.0–49.7)
MCH: 31.9 pg (ref 25.1–34.0)
MCHC: 33.5 g/dL (ref 31.5–36.0)
MCV: 95.1 fL (ref 79.5–101.0)
MONO#: 0.1 10*3/uL (ref 0.1–0.9)
MONO%: 1.9 % (ref 0.0–14.0)
NEUT#: 6.2 10*3/uL (ref 1.5–6.5)
NEUT%: 86.1 % — ABNORMAL HIGH (ref 38.4–76.8)
PLATELETS: 445 10*3/uL — AB (ref 145–400)
RBC: 3.48 10*6/uL — AB (ref 3.70–5.45)
RDW: 20.9 % — ABNORMAL HIGH (ref 11.2–14.5)
WBC: 7.2 10*3/uL (ref 3.9–10.3)
lymph#: 0.9 10*3/uL (ref 0.9–3.3)
nRBC: 2 % — ABNORMAL HIGH (ref 0–0)

## 2015-06-02 MED ORDER — SODIUM CHLORIDE 0.9 % IV SOLN
Freq: Once | INTRAVENOUS | Status: AC
  Administered 2015-06-02: 13:00:00 via INTRAVENOUS
  Filled 2015-06-02: qty 4

## 2015-06-02 MED ORDER — SODIUM CHLORIDE 0.9 % IV SOLN
80.0000 mg/m2 | Freq: Once | INTRAVENOUS | Status: AC
  Administered 2015-06-02: 150 mg via INTRAVENOUS
  Filled 2015-06-02: qty 25

## 2015-06-02 MED ORDER — FAMOTIDINE IN NACL 20-0.9 MG/50ML-% IV SOLN
INTRAVENOUS | Status: AC
  Filled 2015-06-02: qty 50

## 2015-06-02 MED ORDER — DIPHENHYDRAMINE HCL 50 MG/ML IJ SOLN
25.0000 mg | Freq: Once | INTRAMUSCULAR | Status: AC
  Administered 2015-06-02: 25 mg via INTRAVENOUS

## 2015-06-02 MED ORDER — DIPHENHYDRAMINE HCL 50 MG/ML IJ SOLN
INTRAMUSCULAR | Status: AC
  Filled 2015-06-02: qty 1

## 2015-06-02 MED ORDER — FAMOTIDINE IN NACL 20-0.9 MG/50ML-% IV SOLN
20.0000 mg | Freq: Once | INTRAVENOUS | Status: AC
  Administered 2015-06-02: 20 mg via INTRAVENOUS

## 2015-06-02 MED ORDER — SODIUM CHLORIDE 0.9 % IV SOLN
Freq: Once | INTRAVENOUS | Status: AC
  Administered 2015-06-02: 13:00:00 via INTRAVENOUS

## 2015-06-02 NOTE — Patient Instructions (Signed)
[facility] Discharge Instructions for Patients Receiving Chemotherapy  Today you received the following chemotherapy agents Taxol  To help prevent nausea and vomiting after your treatment, we encourage you to take your nausea medication   If you develop nausea and vomiting that is not controlled by your nausea medication, call the clinic.   BELOW ARE SYMPTOMS THAT SHOULD BE REPORTED IMMEDIATELY:  *FEVER GREATER THAN 100.5 F  *CHILLS WITH OR WITHOUT FEVER  NAUSEA AND VOMITING THAT IS NOT CONTROLLED WITH YOUR NAUSEA MEDICATION  *UNUSUAL SHORTNESS OF BREATH  *UNUSUAL BRUISING OR BLEEDING  TENDERNESS IN MOUTH AND THROAT WITH OR WITHOUT PRESENCE OF ULCERS  *URINARY PROBLEMS  *BOWEL PROBLEMS  UNUSUAL RASH Items with * indicate a potential emergency and should be followed up as soon as possible.  Feel free to call the clinic you have any questions or concerns. The clinic phone number is [phone].  Please show the CHEMO ALERT CARD at check-in to the Emergency Department and triage nurse.   

## 2015-06-03 ENCOUNTER — Ambulatory Visit (HOSPITAL_BASED_OUTPATIENT_CLINIC_OR_DEPARTMENT_OTHER): Payer: BLUE CROSS/BLUE SHIELD

## 2015-06-03 VITALS — BP 133/71 | HR 81 | Temp 98.4°F | Resp 18

## 2015-06-03 DIAGNOSIS — D701 Agranulocytosis secondary to cancer chemotherapy: Secondary | ICD-10-CM | POA: Diagnosis not present

## 2015-06-03 DIAGNOSIS — C569 Malignant neoplasm of unspecified ovary: Secondary | ICD-10-CM

## 2015-06-03 MED ORDER — TBO-FILGRASTIM 480 MCG/0.8ML ~~LOC~~ SOSY
480.0000 ug | PREFILLED_SYRINGE | Freq: Once | SUBCUTANEOUS | Status: AC
  Administered 2015-06-03: 480 ug via SUBCUTANEOUS
  Filled 2015-06-03: qty 0.8

## 2015-06-03 NOTE — Patient Instructions (Signed)

## 2015-06-21 ENCOUNTER — Ambulatory Visit: Payer: BLUE CROSS/BLUE SHIELD | Admitting: Oncology

## 2015-06-21 ENCOUNTER — Other Ambulatory Visit: Payer: BLUE CROSS/BLUE SHIELD

## 2015-06-22 ENCOUNTER — Ambulatory Visit (HOSPITAL_COMMUNITY)
Admission: RE | Admit: 2015-06-22 | Discharge: 2015-06-22 | Disposition: A | Discharge status: Home / Self Care | Payer: BLUE CROSS/BLUE SHIELD | Source: Ambulatory Visit | Attending: Oncology | Admitting: Oncology

## 2015-06-22 ENCOUNTER — Encounter (HOSPITAL_COMMUNITY): Payer: Self-pay

## 2015-06-22 DIAGNOSIS — C561 Malignant neoplasm of right ovary: Secondary | ICD-10-CM | POA: Insufficient documentation

## 2015-06-22 DIAGNOSIS — Z923 Personal history of irradiation: Secondary | ICD-10-CM | POA: Insufficient documentation

## 2015-06-22 DIAGNOSIS — N2889 Other specified disorders of kidney and ureter: Secondary | ICD-10-CM | POA: Diagnosis not present

## 2015-06-22 DIAGNOSIS — Z9081 Acquired absence of spleen: Secondary | ICD-10-CM | POA: Insufficient documentation

## 2015-06-22 MED ORDER — IOPAMIDOL (ISOVUE-300) INJECTION 61%
100.0000 mL | Freq: Once | INTRAVENOUS | Status: AC | PRN
  Administered 2015-06-22: 100 mL via INTRAVENOUS

## 2015-06-23 ENCOUNTER — Other Ambulatory Visit: Payer: Self-pay | Admitting: Oncology

## 2015-06-23 DIAGNOSIS — C561 Malignant neoplasm of right ovary: Secondary | ICD-10-CM

## 2015-06-29 ENCOUNTER — Other Ambulatory Visit: Payer: Self-pay | Admitting: Oncology

## 2015-06-30 ENCOUNTER — Other Ambulatory Visit: Payer: Self-pay | Admitting: Oncology

## 2015-07-01 ENCOUNTER — Encounter: Payer: Self-pay | Admitting: Oncology

## 2015-07-01 ENCOUNTER — Other Ambulatory Visit (HOSPITAL_BASED_OUTPATIENT_CLINIC_OR_DEPARTMENT_OTHER): Payer: BLUE CROSS/BLUE SHIELD

## 2015-07-01 ENCOUNTER — Ambulatory Visit (HOSPITAL_BASED_OUTPATIENT_CLINIC_OR_DEPARTMENT_OTHER): Payer: BLUE CROSS/BLUE SHIELD | Admitting: Oncology

## 2015-07-01 VITALS — BP 111/50 | HR 82 | Temp 97.8°F | Resp 18 | Ht 68.0 in | Wt 171.1 lb

## 2015-07-01 DIAGNOSIS — C561 Malignant neoplasm of right ovary: Secondary | ICD-10-CM

## 2015-07-01 DIAGNOSIS — C569 Malignant neoplasm of unspecified ovary: Secondary | ICD-10-CM

## 2015-07-01 DIAGNOSIS — C7989 Secondary malignant neoplasm of other specified sites: Secondary | ICD-10-CM

## 2015-07-01 DIAGNOSIS — Z9081 Acquired absence of spleen: Secondary | ICD-10-CM

## 2015-07-01 LAB — CBC WITH DIFFERENTIAL/PLATELET
BASO%: 0.6 % (ref 0.0–2.0)
BASOS ABS: 0.1 10*3/uL (ref 0.0–0.1)
EOS%: 1.4 % (ref 0.0–7.0)
Eosinophils Absolute: 0.1 10*3/uL (ref 0.0–0.5)
HCT: 38.4 % (ref 34.8–46.6)
HEMOGLOBIN: 12.7 g/dL (ref 11.6–15.9)
LYMPH%: 23.5 % (ref 14.0–49.7)
MCH: 33.4 pg (ref 25.1–34.0)
MCHC: 33 g/dL (ref 31.5–36.0)
MCV: 101.3 fL — ABNORMAL HIGH (ref 79.5–101.0)
MONO#: 0.9 10*3/uL (ref 0.1–0.9)
MONO%: 10.5 % (ref 0.0–14.0)
NEUT%: 64 % (ref 38.4–76.8)
NEUTROS ABS: 5.5 10*3/uL (ref 1.5–6.5)
Platelets: 314 10*3/uL (ref 145–400)
RBC: 3.79 10*6/uL (ref 3.70–5.45)
RDW: 18.7 % — ABNORMAL HIGH (ref 11.2–14.5)
WBC: 8.6 10*3/uL (ref 3.9–10.3)
lymph#: 2 10*3/uL (ref 0.9–3.3)

## 2015-07-01 LAB — COMPREHENSIVE METABOLIC PANEL
ALBUMIN: 4.1 g/dL (ref 3.5–5.0)
ALK PHOS: 76 U/L (ref 40–150)
ALT: 13 U/L (ref 0–55)
AST: 18 U/L (ref 5–34)
Anion Gap: 8 mEq/L (ref 3–11)
BILIRUBIN TOTAL: 0.61 mg/dL (ref 0.20–1.20)
BUN: 18.4 mg/dL (ref 7.0–26.0)
CO2: 29 meq/L (ref 22–29)
Calcium: 9.7 mg/dL (ref 8.4–10.4)
Chloride: 104 mEq/L (ref 98–109)
Creatinine: 0.9 mg/dL (ref 0.6–1.1)
EGFR: 71 mL/min/{1.73_m2} — AB (ref >90)
GLUCOSE: 92 mg/dL (ref 70–140)
POTASSIUM: 3.9 meq/L (ref 3.5–5.1)
SODIUM: 141 meq/L (ref 136–145)
TOTAL PROTEIN: 7.5 g/dL (ref 6.4–8.3)

## 2015-07-01 NOTE — Progress Notes (Signed)
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 She had resection of area of recurrence involving spleen and tail of pancreas 02-02-15, then 4 cycles of dose dense carboplatin taxol completed 06-02-15. She had CT AP on 06-22-15, with a vague area questioned in right lobe of liver, but otherwise no apparent residual or progressive disease. She is to see Dr Skeet Latch on 07-15-15.   Patient has felt progressively better in the few weeks since last chemotherapy. She is now walking 3.5 miles in an hour on treadmill daily. Appetite is more normal, bowels are moving well, no significant residual peripheral neuropathy. She denies abdominal or pelvic discomfort. She has had no fever or symptoms of infection, no bleeding, no LE swelling, no bladder symptoms, no SOB or other respiratory symptoms.  Remainder of 10 point Review of Systems negative.    Flu vaccine 01-19-15 No PAC. All of previous chemo done peripheral Genetics testing normal 08-2013 ([employer] panel) Pneumovax, meningococcal vaccine and Hemophilus influenza all 01-14-15 Splenectomy 02-02-15 CA 125 was 2418 at presentation 03-2012 and 88 on 01-04-15 prior to splenectomy  ONCOLOGIC HISTORY Patient was in usual excellent health thru Sept 2013,then in Oct 2013 more fatigued, with some fecal urge incontinence and abdominal fullness such that she began to diet to lose weight. By Marcene Duos she could not climb one flight of stairs and could walk only from office to car without SOB and coughing. She was seen at urgent care in Dec with bilateral pleural effusions on CXR, referred to Dr Clois Comber, with 1.6 liter right thoracentesis done 03-11-12 (no path in this EMR). She had CT CAP and CT angio chest with large bilateral pleural effusions, no PE, ascites and pelvic  mass. She had paracentesis for 1.4 liters on 04-02-12 and left thoracentesis for 2 liters on 04-05-12. CA 125 was 2418. She was seen in consultation by Dr Skeet Latch, with TAH/BSO, radical debulking and omentectomy on 04-09-2012, which was optimal debulking with only residual at completion of procedure being 7 mm area on posterior surface of right lobe liver. She was transfused 2 units PRBCs on 04-10-12 for Hgb down to 7.2 (from 12 preop). Pathology [phone]) high grade serous carcinoma involving right pelvic mass, serosa of uterus and peritoneum, omentum negative, no nodes submitted, felt IIIC right ovarian primary. Adjuvant therapy with dose dense taxol/ carboplatin began 05-03-12, with CA125 down to 30 by early April. Cycle 6 was completed 09-10-12. CA 125 remained in good range, including 14 in 03-2009 and 25 on 09-24-14, however was 88 in early Oct 2016. CT CAP 01-11-15 found mass at anterior aspect of spleen 11.5 x 8.2 x 13 cm, with stable unremarkable chest, no ascites, no adenopathy, no pelvic masses. She had splenectomy by Dr Barry Dienes 02-02-15, pathology [phone]) high grade serous carcinoma involving splenic capsule and parenchyma, 9 cm. There was no visualized abdominal carcinomatosis; tail of pancreas was adherent to mass and taken with spleen. She began dose dense carbo taxol on 03-16-15, required addition of granix for leukopenia .  Objective:  Vital signs in last 24 hours:  BP 111/50 mmHg  Pulse 82  Temp(Src) 97.8 F (36.6 C) (Oral)  Resp 18  Ht '5\' 8"'  (1.727 m)  Wt 171 lb 1.6 oz (77.61 kg)  BMI 26.02 kg/m2  SpO2 100% Weight is down 1.5 lbs Alert,  oriented and appropriate. Ambulatory without difficulty. Looks more energetic, comfortable. Alopecia.  HEENT:PERRL, sclerae not icteric. Oral mucosa moist without lesions, posterior pharynx clear.  Neck supple. No JVD.  Lymphatics:no cervical,supraclavicular, axillary or inguinal adenopathy Resp: clear to auscultation bilaterally and normal  percussion bilaterally Cardio: regular rate and rhythm. No gallop. GI: soft, nontender, not distended, no mass or organomegaly. Normally active bowel sounds.  Musculoskeletal/ Extremities: without pitting edema, cords, tenderness Neuro: no increased peripheral neuropathy. Otherwise nonfocal. PSYCH appropriate mood and affect Skin without rash, ecchymosis, petechiae   Lab Results:  Results for orders placed or performed in visit on 07/01/15  CBC with Differential  Result Value Ref Range   WBC 8.6 3.9 - 10.3 10e3/uL   NEUT# 5.5 1.5 - 6.5 10e3/uL   HGB 12.7 11.6 - 15.9 g/dL   HCT 38.4 34.8 - 46.6 %   Platelets 314 145 - 400 10e3/uL   MCV 101.3 (H) 79.5 - 101.0 fL   MCH 33.4 25.1 - 34.0 pg   MCHC 33.0 31.5 - 36.0 g/dL   RBC 3.79 3.70 - 5.45 10e6/uL   RDW 18.7 (H) 11.2 - 14.5 %   lymph# 2.0 0.9 - 3.3 10e3/uL   MONO# 0.9 0.1 - 0.9 10e3/uL   Eosinophils Absolute 0.1 0.0 - 0.5 10e3/uL   Basophils Absolute 0.1 0.0 - 0.1 10e3/uL   NEUT% 64.0 38.4 - 76.8 %   LYMPH% 23.5 14.0 - 49.7 %   MONO% 10.5 0.0 - 14.0 %   EOS% 1.4 0.0 - 7.0 %   BASO% 0.6 0.0 - 2.0 %  Comprehensive metabolic panel  Result Value Ref Range   Sodium 141 136 - 145 mEq/L   Potassium 3.9 3.5 - 5.1 mEq/L   Chloride 104 98 - 109 mEq/L   CO2 29 22 - 29 mEq/L   Glucose 92 70 - 140 mg/dl   BUN 18.4 7.0 - 26.0 mg/dL   Creatinine 0.9 0.6 - 1.1 mg/dL   Total Bilirubin 0.61 0.20 - 1.20 mg/dL   Alkaline Phosphatase 76 40 - 150 U/L   AST 18 5 - 34 U/L   ALT 13 0 - 55 U/L   Total Protein 7.5 6.4 - 8.3 g/dL   Albumin 4.1 3.5 - 5.0 g/dL   Calcium 9.7 8.4 - 10.4 mg/dL   Anion Gap 8 3 - 11 mEq/L   EGFR 71 (L) >90 ml/min/1.73 m2  CA 125  Result Value Ref Range   Cancer Antigen (CA) 125 26.1 0.0 - 38.1 U/mL    CA 125 by same lab method was 28 on 04-14-15 and 26 on 05-10-15.  Studies/Results: EXAM: CT ABDOMEN AND PELVIS WITH CONTRAST  TECHNIQUE: Multidetector CT imaging of the abdomen and pelvis was performed using the  standard protocol following bolus administration of intravenous contrast.  CONTRAST: 172m ISOVUE-300 IOPAMIDOL (ISOVUE-300) INJECTION 61%  COMPARISON: 01/11/2015  FINDINGS: Lower chest: 3 mm right lung base nodule on image 1/ series 4 is present back on the prior PET of 10/15/2012, and can therefore be considered benign. Bilateral breast implants. Normal heart size without pericardial or pleural effusion.  Hepatobiliary: Dominant right hepatic lobe cyst of 3.8 cm is similar. There are scattered too small to characterize lesions throughout the liver which are favored to represent cysts or possibly bile duct hamartomas. A vague area of hyperattenuation or hyper enhancement in the high right lobe of the liver measures on the order of 3.3 x 3.1 cm on image 10/series 2 and is not readily apparent  on the prior exam. Also coronal image 31. Mild intrahepatic duct dilatation is identified within the adjacent right lobe of the liver, most apparent on coronal image 32/series 602. This is new.  Normal gallbladder, without common duct dilatation.  Pancreas: Normal, without mass or ductal dilatation.  Spleen: Interval splenectomy, without residual disease in the left upper quadrant.  Adrenals/Urinary Tract: Normal adrenal glands. Minimal upper pole right sided caliectasis is similar. Minimal proximal right ureteric prominence is also not significantly changed and without cause. Normal left kidney and urinary bladder.  Stomach/Bowel: Normal stomach, without wall thickening. Normal colon, appendix, and terminal ileum. Normal small bowel.  Vascular/Lymphatic: Normal caliber of the aorta and branch vessels. No abdominopelvic adenopathy.  Reproductive: Hysterectomy. No adnexal mass. Subtle asymmetry of the vaginal cuff, slightly greater on the left. Example image 80/series 2. Felt to be similar and within normal variation.  Other: No significant free fluid. No evidence of  omental or peritoneal disease.  Musculoskeletal: Vague sclerosis involving the left iliac is similar and favored to be degenerative.  IMPRESSION: 1. Interval splenectomy, without residual or locally recurrent disease in the left upper quadrant. 2. Vague hyperattenuation or hyper enhancement along the capsule of the high right lobe of the liver. Concurrent subtle adjacent right hepatic lobe intrahepatic duct dilatation. Although this does not have the typical appearance of an ovarian cancer metastasis, this cannot be excluded. Potential clinical strategies include further evaluation with PET or pre and post contrast abdominal MRI. 3. Otherwise, no evidence of metastatic disease within the abdomen or pelvis. 4. Minimal right upper pole caliectasis and prominence of the proximal right ureter, similar and favored to be within normal variation.  PACs images reviewed with patient at visit.  Medications: I have reviewed the patient's current medications.  DISCUSSION Information from CT reviewed and images of the very vague hepatic area reviewed as marked on CT. Prior to patient's visit, Dr Skeet Latch also reviewed. We have recommended PET, order placed and managed care notified for preauthorization.    Patient and I are pleased that she is feeling so well, encouraged continuing exercise.  Not discussed, I will request ER PR testing on surgical path from 01-2015.   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 Recurrence with 9 cm mass in splenic hilum at splenectomy with resection of involved pancreatic tail 02-02-15, CA 125 elevated at 88 preoperatively, now in normal range. Received 4 cycles dose dense carbo taxol thru 06-02-15. CT questioned area in right hepatic lobe otherwise good, PET requested.   2.Post splenectomy: vaccines  given 12-2014. She knows to be in touch with MD immediately if fever/ infectious illness, as low threshold for antibiotics  3. Deconditioning with surgery and chemo improving 4. mammograms up to date and not remarkable 5. No colonoscopy: consider referral after PET/ gyn onc reevaluation 6.slight preexisting peripheral neuropathy fingertips, not worse with this chemo 7. 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 Time spent 25 min including >50% counseling and coordination of care. Cc Drs Skeet Latch and Barry Dienes, route to PCP    Gordy Levan, MD   07/02/2015, 9:00 PM

## 2015-07-02 ENCOUNTER — Telehealth: Payer: Self-pay | Admitting: Oncology

## 2015-07-02 ENCOUNTER — Other Ambulatory Visit: Payer: Self-pay | Admitting: Oncology

## 2015-07-02 ENCOUNTER — Telehealth: Payer: Self-pay

## 2015-07-02 LAB — CA 125: Cancer Antigen (CA) 125: 26.1 U/mL (ref 0.0–38.1)

## 2015-07-02 NOTE — Telephone Encounter (Signed)
Told Shelley Sanchez the result  of CA-125 and PET approval as noted below by Dr. Marko Plume.

## 2015-07-02 NOTE — Telephone Encounter (Signed)
-----   Message from Gordy Levan, MD sent at 07/02/2015  8:38 AM EDT ----- Please let her know CA 125 still in good normal range and that PET was approved. thanks

## 2015-07-02 NOTE — Telephone Encounter (Signed)
Added May appt per LL 4/6 pof. Pet scan to be sch by central radiology

## 2015-07-04 ENCOUNTER — Other Ambulatory Visit: Payer: Self-pay | Admitting: Oncology

## 2015-07-05 ENCOUNTER — Other Ambulatory Visit: Payer: BLUE CROSS/BLUE SHIELD

## 2015-07-05 ENCOUNTER — Ambulatory Visit: Payer: BLUE CROSS/BLUE SHIELD | Admitting: Oncology

## 2015-07-05 ENCOUNTER — Other Ambulatory Visit: Payer: Self-pay | Admitting: Oncology

## 2015-07-12 ENCOUNTER — Encounter: Payer: Self-pay | Admitting: Oncology

## 2015-07-12 NOTE — Progress Notes (Signed)
Medical Oncology  Requested ER PR on [facility] surgical path [phone] from 02-02-15  L.Marko Plume, MD

## 2015-07-13 ENCOUNTER — Other Ambulatory Visit: Payer: Self-pay | Admitting: Oncology

## 2015-07-14 ENCOUNTER — Encounter: Payer: Self-pay | Admitting: Oncology

## 2015-07-14 ENCOUNTER — Telehealth: Payer: Self-pay

## 2015-07-14 ENCOUNTER — Ambulatory Visit (HOSPITAL_COMMUNITY)
Admission: RE | Admit: 2015-07-14 | Discharge: 2015-07-14 | Disposition: A | Discharge status: Home / Self Care | Payer: BLUE CROSS/BLUE SHIELD | Source: Ambulatory Visit | Attending: Oncology | Admitting: Oncology

## 2015-07-14 ENCOUNTER — Other Ambulatory Visit: Payer: Self-pay | Admitting: Oncology

## 2015-07-14 DIAGNOSIS — Z90710 Acquired absence of both cervix and uterus: Secondary | ICD-10-CM | POA: Diagnosis not present

## 2015-07-14 DIAGNOSIS — R918 Other nonspecific abnormal finding of lung field: Secondary | ICD-10-CM | POA: Insufficient documentation

## 2015-07-14 DIAGNOSIS — C569 Malignant neoplasm of unspecified ovary: Secondary | ICD-10-CM | POA: Diagnosis not present

## 2015-07-14 DIAGNOSIS — Z9081 Acquired absence of spleen: Secondary | ICD-10-CM | POA: Diagnosis not present

## 2015-07-14 DIAGNOSIS — K7689 Other specified diseases of liver: Secondary | ICD-10-CM | POA: Diagnosis not present

## 2015-07-14 LAB — GLUCOSE, CAPILLARY: Glucose-Capillary: 99 mg/dL (ref 65–99)

## 2015-07-14 MED ORDER — FLUDEOXYGLUCOSE F - 18 (FDG) INJECTION
8.2800 | Freq: Once | INTRAVENOUS | Status: AC | PRN
  Administered 2015-07-14: 8.28 via INTRAVENOUS

## 2015-07-14 NOTE — Progress Notes (Signed)
Medical Oncology  Surgical pathology from 01-2015  ER and PR 0  Under path addendum this EMR  L.Marko Plume, MD

## 2015-07-14 NOTE — Telephone Encounter (Signed)
Left a message in Ms. Tumminello's voice mail stating the results of the Pet Scan from this morning 07-14-15 as noted below by Dr. Marko Plume.

## 2015-07-14 NOTE — Telephone Encounter (Signed)
-----   Message from Gordy Levan, MD sent at 07/14/2015  1:14 PM EDT ----- Please let her know PET does not show any active cancer that we can tell. Keep apt with Dr Skeet Latch in May  RN: if Dr Skeet Latch recommends observation at her visit, we can probably move my May apt out. Need to be sure she gets PAC flushed at correct intervals if my apt moved.  They may have used PAC for this PET.  thanks

## 2015-07-15 ENCOUNTER — Ambulatory Visit: Payer: BLUE CROSS/BLUE SHIELD | Admitting: Gynecologic Oncology

## 2015-07-20 ENCOUNTER — Encounter: Payer: Self-pay | Admitting: *Deleted

## 2015-07-28 NOTE — Progress Notes (Signed)
Office Visit Note: Gyn-Onc  Shelley Sanchez 60 y.o. female  CC:  Recurrent ovarian cancer   Assessment/Plan:  60 y.o. who underwent optimal debulking on 04/09/2012. 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 She completed six cycles dose dense.  TaxolCarboplatin therapy 08/2012. 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  Treated with splenectomy taxol/carboplatin. NED on imaging.  Close follow-up  Follow-up in 3 months    HPI: 60 y.o. G3 P3 LNMP in early 10's. In October 2013 Shelley Sanchez noted fatigue, and fecal urge incontinence. Also noted increasing abdominal girth. No change in appetite, no nausea or vomiting. CT Chest/Abd/Pelvis 04/01/2012 notable for ascites so patient was admitted for evaluation. Paracentesis performed 04/01/2012. Results were negative for malignancy.  CA -125 at the time of presentation was 2418.   Shelley Sanchez underwent exploratory laparotomy total abdominal hysterectomy bilateral salpingo-oophorectomy omentectomy radical debulking to optimal disease on 04/09/2012.  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 The only gross disease present was a 7 mm lesion in the posterior most aspect of the liver.  Path consistent with stage IIIc high grade serous cancer.  She received  six cycles of dose dense Taxol carboplatin therapy completed 09/10/2012   Rising CA 125 noted in 2016.   CT CAP 01-11-15 found mass at anterior aspect of spleen 11.5 x 8.2 x 13 cm, with stable unremarkable chest, no ascites, no adenopathy, no pelvic masses. She had splenectomy by Dr Shelley Sanchez 02-02-15, pathology [phone]) high grade serous carcinoma involving splenic capsule and parenchyma, 9 cm. There was no visualized abdominal carcinomatosis; tail of pancreas was adherent to mass and taken with spleen. She received cycle 5  dense carbo taxol on 05/2015.  PET 07/12/2015 IMPRESSION: No evidence of  metastatic disease. Specifically, no abnormal hypermetabolism in the liver to correspond to the reported abnormality on 06/22/2015. Doing very well  ROS:  Fatigue, difficulty concentrating, no change in bowel or bladder habits, no nausea or emesis, no vaginal bleeding, no SOB no chest pain otherwise 10 point ROs negative  Social Hx:  Resides with her significant other, works full time, overall happy  Past Surgical Hx:  Past Surgical History  Procedure Laterality Date  . Breast enhancement surgery  2000  . Laparotomy  04/09/2012    Procedure: EXPLORATORY LAPAROTOMY;  Surgeon: [name], MD PHD;  Location: [facility];  Service: Gynecology;  Laterality: N/A;  EXPLORATORY LAPAROTOMY, TOTAL ABDOMINAL HYESTERCTOMY, BILATERAL SALPINGO OOPHERECTOMY  . Abdominal hysterectomy  04/09/2012    Procedure: HYSTERECTOMY ABDOMINAL;  Surgeon: [name], MD PHD;  Location: [facility];  Service: Gynecology;  Laterality: N/A;  . Mohs procedure to right side of base of nose       Past Medical Hx:  Past Medical History  Diagnosis Date  . Squamous cell carcinoma of right hand 2006    s/p excision  . Ascites 03/2012    on CT a/p; s/p paracentesis  . Ovarian cancer ([place]) 03/2012 dx    s/p debulk = stage III C poor diff, ov serous ca  . Family history of ovarian cancer   . 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 Hyperthyroidism   . Headache   Genetic testing was normal and did not reveal a mutation in these genes. The genes tested were ATM, BARD1, BRCA1, BRCA2, BRIP1, CDH1, CHEK2, EPCAM, MLH1, MRE11A, MSH2, MSH6, MUTYH, NBN, NF1, PALB2, PMS2, PTEN, RAD50, RAD51C, RAD51D, SMARCA4, STK11, and TP53.   Family  Hx:  Family History  Problem Relation Age of Onset  . Melanoma Mother 43    on her back; TAH/BSO in ~30 d/t bleeding  . Skin cancer Brother 56    squamous cell  . Thyroid cancer Brother 55    s/p thyroidectomy  . Ovarian cancer Maternal Aunt 72    "abd ca" possible ovarian;  deceased 50s  . Rheum arthritis Mother   . Macular degeneration Mother    PHYSICAL EXAMINATION Vitals BP 120/71 mmHg  Pulse 84  Temp(Src) 97.7 F (36.5 C) (Oral)  Resp 18  Ht '5\' 8"'  (1.727 m)  Wt 166 lb 11.2 oz (75.615 kg)  BMI 25.35 kg/m2  SpO2 100% About on the right. There is Wt Readings from Last 3 Encounters:  07/01/15 171 lb 1.6 oz (77.61 kg)  05/31/15 172 lb 8 oz (78.245 kg)  05/10/15 171 lb 1.6 oz (77.61 kg)   Neck  Supple without any enlargements.  Cardiovascular  Pulse normal rate, regularity and rhythm.  Lungs  Clear to auscultation bilaterally  Psychiatry  Alert and oriented appropriate mood and affect.  Abdomen  Normoactive bowel sounds, abdomen soft, non-tender and obese. Surgical  sites intact without evidence of hernia or tenderness Genito Urinary  Deferred Good tone, no masses no cul de sac nodularity.  Extremities  No bilateral cyanosis, clubbing or edema. No rash, Bill for missed-year-old were held to thelesions or petiche.

## 2015-07-29 ENCOUNTER — Encounter: Payer: Self-pay | Admitting: Gynecologic Oncology

## 2015-07-29 ENCOUNTER — Ambulatory Visit: Payer: BLUE CROSS/BLUE SHIELD | Attending: Gynecologic Oncology | Admitting: Gynecologic Oncology

## 2015-07-29 VITALS — BP 120/71 | HR 84 | Temp 97.7°F | Resp 18 | Ht 68.0 in | Wt 166.7 lb

## 2015-07-29 DIAGNOSIS — R5383 Other fatigue: Secondary | ICD-10-CM | POA: Insufficient documentation

## 2015-07-29 DIAGNOSIS — C4482 Squamous cell carcinoma of overlapping sites of skin: Secondary | ICD-10-CM | POA: Diagnosis not present

## 2015-07-29 DIAGNOSIS — Z9079 Acquired absence of other genital organ(s): Secondary | ICD-10-CM | POA: Insufficient documentation

## 2015-07-29 DIAGNOSIS — E0590 Thyrotoxicosis, unspecified without thyrotoxic crisis or storm: Secondary | ICD-10-CM | POA: Insufficient documentation

## 2015-07-29 DIAGNOSIS — C569 Malignant neoplasm of unspecified ovary: Secondary | ICD-10-CM

## 2015-07-29 DIAGNOSIS — Z8543 Personal history of malignant neoplasm of ovary: Secondary | ICD-10-CM | POA: Diagnosis not present

## 2015-07-29 DIAGNOSIS — R51 Headache: Secondary | ICD-10-CM | POA: Insufficient documentation

## 2015-07-29 DIAGNOSIS — R188 Other ascites: Secondary | ICD-10-CM | POA: Insufficient documentation

## 2015-07-29 DIAGNOSIS — C561 Malignant neoplasm of right ovary: Secondary | ICD-10-CM | POA: Diagnosis not present

## 2015-07-29 DIAGNOSIS — Z9081 Acquired absence of spleen: Secondary | ICD-10-CM | POA: Diagnosis not present

## 2015-07-29 DIAGNOSIS — Z808 Family history of malignant neoplasm of other organs or systems: Secondary | ICD-10-CM | POA: Diagnosis not present

## 2015-07-29 DIAGNOSIS — Z90710 Acquired absence of both cervix and uterus: Secondary | ICD-10-CM | POA: Insufficient documentation

## 2015-07-29 DIAGNOSIS — Z90722 Acquired absence of ovaries, bilateral: Secondary | ICD-10-CM | POA: Insufficient documentation

## 2015-07-29 NOTE — Patient Instructions (Signed)
Plan to follow up in August or sooner if needed.  Appt will be on August 31 at 9:30am.  Please call for any questions or concerns.

## 2015-07-30 ENCOUNTER — Telehealth: Payer: Self-pay

## 2015-07-30 NOTE — Telephone Encounter (Signed)
Dr. Skeet Latch at appointment on 07-29-15  recommended  Close observation.   Dr. Skeet Latch will see Ms Riker 11-25-15.  R/S Dr. [name] appointment from 08-12-15 to 09-30-15 as per Dr. Marko Plume recommendation. Pt does not have a PAC placed so she does not need flushes scheduled. Ms. Hunker verbalized understanding.

## 2015-08-10 ENCOUNTER — Encounter: Payer: Self-pay | Admitting: Oncology

## 2015-08-10 NOTE — Progress Notes (Signed)
Fax sent 03/26/15 and 04/01/15 I sent to medical records

## 2015-08-12 ENCOUNTER — Other Ambulatory Visit: Payer: BLUE CROSS/BLUE SHIELD

## 2015-08-12 ENCOUNTER — Ambulatory Visit: Payer: BLUE CROSS/BLUE SHIELD | Admitting: Oncology

## 2015-09-27 ENCOUNTER — Other Ambulatory Visit: Payer: Self-pay | Admitting: Nurse Practitioner

## 2015-09-29 ENCOUNTER — Other Ambulatory Visit: Payer: Self-pay | Admitting: Oncology

## 2015-09-29 ENCOUNTER — Other Ambulatory Visit: Payer: Self-pay

## 2015-09-29 DIAGNOSIS — C561 Malignant neoplasm of right ovary: Secondary | ICD-10-CM

## 2015-09-30 ENCOUNTER — Ambulatory Visit (HOSPITAL_BASED_OUTPATIENT_CLINIC_OR_DEPARTMENT_OTHER): Payer: BLUE CROSS/BLUE SHIELD

## 2015-09-30 ENCOUNTER — Encounter: Payer: Self-pay | Admitting: Oncology

## 2015-09-30 ENCOUNTER — Ambulatory Visit (HOSPITAL_BASED_OUTPATIENT_CLINIC_OR_DEPARTMENT_OTHER): Payer: BLUE CROSS/BLUE SHIELD | Admitting: Oncology

## 2015-09-30 ENCOUNTER — Telehealth: Payer: Self-pay | Admitting: Oncology

## 2015-09-30 VITALS — BP 122/66 | HR 76 | Temp 98.6°F | Resp 18 | Ht 68.0 in | Wt 165.9 lb

## 2015-09-30 DIAGNOSIS — G629 Polyneuropathy, unspecified: Secondary | ICD-10-CM | POA: Diagnosis not present

## 2015-09-30 DIAGNOSIS — C561 Malignant neoplasm of right ovary: Secondary | ICD-10-CM

## 2015-09-30 DIAGNOSIS — G620 Drug-induced polyneuropathy: Secondary | ICD-10-CM

## 2015-09-30 DIAGNOSIS — Z9081 Acquired absence of spleen: Secondary | ICD-10-CM

## 2015-09-30 DIAGNOSIS — Z85828 Personal history of other malignant neoplasm of skin: Secondary | ICD-10-CM

## 2015-09-30 DIAGNOSIS — C569 Malignant neoplasm of unspecified ovary: Secondary | ICD-10-CM

## 2015-09-30 DIAGNOSIS — T451X5A Adverse effect of antineoplastic and immunosuppressive drugs, initial encounter: Secondary | ICD-10-CM

## 2015-09-30 DIAGNOSIS — C7889 Secondary malignant neoplasm of other digestive organs: Secondary | ICD-10-CM | POA: Diagnosis not present

## 2015-09-30 LAB — CBC WITH DIFFERENTIAL/PLATELET
BASO%: 0.7 % (ref 0.0–2.0)
Basophils Absolute: 0 10*3/uL (ref 0.0–0.1)
EOS%: 1.8 % (ref 0.0–7.0)
Eosinophils Absolute: 0.1 10*3/uL (ref 0.0–0.5)
HCT: 41.4 % (ref 34.8–46.6)
HEMOGLOBIN: 13.8 g/dL (ref 11.6–15.9)
LYMPH%: 40.3 % (ref 14.0–49.7)
MCH: 31.9 pg (ref 25.1–34.0)
MCHC: 33.3 g/dL (ref 31.5–36.0)
MCV: 95.6 fL (ref 79.5–101.0)
MONO#: 0.6 10*3/uL (ref 0.1–0.9)
MONO%: 9.8 % (ref 0.0–14.0)
NEUT%: 47.4 % (ref 38.4–76.8)
NEUTROS ABS: 2.9 10*3/uL (ref 1.5–6.5)
Platelets: 278 10*3/uL (ref 145–400)
RBC: 4.33 10*6/uL (ref 3.70–5.45)
RDW: 13.8 % (ref 11.2–14.5)
WBC: 6 10*3/uL (ref 3.9–10.3)
lymph#: 2.4 10*3/uL (ref 0.9–3.3)

## 2015-09-30 LAB — COMPREHENSIVE METABOLIC PANEL
ALT: 14 U/L (ref 0–55)
ANION GAP: 10 meq/L (ref 3–11)
AST: 21 U/L (ref 5–34)
Albumin: 4.1 g/dL (ref 3.5–5.0)
Alkaline Phosphatase: 79 U/L (ref 40–150)
BILIRUBIN TOTAL: 0.55 mg/dL (ref 0.20–1.20)
BUN: 11.4 mg/dL (ref 7.0–26.0)
CALCIUM: 9.6 mg/dL (ref 8.4–10.4)
CHLORIDE: 102 meq/L (ref 98–109)
CO2: 27 meq/L (ref 22–29)
Creatinine: 0.8 mg/dL (ref 0.6–1.1)
EGFR: 77 mL/min/{1.73_m2} — AB (ref >90)
Glucose: 91 mg/dl (ref 70–140)
Potassium: 4.1 mEq/L (ref 3.5–5.1)
Sodium: 139 mEq/L (ref 136–145)
TOTAL PROTEIN: 7.7 g/dL (ref 6.4–8.3)

## 2015-09-30 NOTE — Progress Notes (Signed)
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 She is now on observation. Most recent imaging PET 07-14-15 NED. She saw Dr Skeet Latch 07-28-15 and will see her back in August.   Patient has felt very well since she was seen last, with no symptoms that seem of concern from standpoint of the gyn cancer or that treatment, and no infectious illness. Appetite is good tho she is trying to watch diet, energy excellent including 4 miles in an hour daily on treadmill, bowels fine, no abdominal or pelvic discomfort, no SOB, no new or different pain, no LE swelling, no bleeding. 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 Remainder of 10 point Review of Systems negative.   No PAC. All of previous chemo done peripheral Genetics testing normal 08-2013 ([employer] panel) Pneumovax 23, meningococcal vaccine "Menomune" and Hemophilus B all 01-14-15. MESSAGE TO [facility]ARMACIST NOW ? 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 By Marcene Duos she could not climb one flight of stairs and could walk only from office to car without SOB and coughing. She was seen at urgent care in Dec with bilateral pleural effusions on CXR, referred to Dr Clois Comber, with 1.6 liter right thoracentesis done 03-11-12 (no path in this EMR). She had CT CAP  and CT angio chest with large bilateral pleural effusions, no PE, ascites and pelvic mass. She had paracentesis for 1.4 liters on 04-02-12 and left thoracentesis for 2 liters on 04-05-12. CA 125 was 2418. She was seen in consultation by Dr Skeet Latch, with TAH/BSO, radical debulking and omentectomy on 04-09-2012, which was optimal debulking with only residual at completion of procedure being 7 mm area on posterior surface of right lobe liver. She was transfused 2 units PRBCs on 04-10-12 for Hgb down to 7.2 (from 12 preop). Pathology [id number]) high grade serous carcinoma involving right pelvic mass, serosa of uterus and peritoneum, omentum negative, no nodes submitted, felt IIIC right ovarian primary. Adjuvant therapy with dose dense taxol/ carboplatin began 05-03-12, with CA125 down to 30 by early April. Cycle 6 was completed 09-10-12. CA 125 remained in good range, including 14 in 03-2009 and 25 on 09-24-14, however was 88 in early Oct 2016. CT CAP 01-11-15 found mass at anterior aspect of spleen 11.5 x 8.2 x 13 cm, with stable unremarkable chest, no ascites, no adenopathy, no pelvic masses. She had splenectomy by Dr Barry Dienes 02-02-15, pathology [id number]) high grade serous carcinoma involving splenic capsule and parenchyma, 9 cm. There was no visualized abdominal carcinomatosis; tail of pancreas was adherent to mass and taken with spleen. She began dose dense carbo taxol on 03-16-15, required addition of granix for leukopenia, total 4 cycles given thru 06-02-15.   Objective:  Vital signs in last 24 hours:  BP 122/66 mmHg  Pulse 76  Temp(Src) 98.6 F (37 C) (Oral)  Resp 18  Ht _0  (1.727 m)  Wt 165 lb 14.4 oz (75.252 kg)  BMI 25.23 kg/m2  SpO2 99% Weight down 5 lbs from 07-02-15 intentional Alert, oriented and appropriate. Quickly and easily ambulatory/ mobile No alopecia  HEENT:PERRL, sclerae not icteric. Oral mucosa moist without lesions, posterior pharynx clear.  Neck supple. No JVD.  Lymphatics:no  cervical,supraclavicular, axillary or inguinal adenopathy Resp: clear to auscultation bilaterally and normal percussion bilaterally Cardio: regular rate and rhythm. No gallop. GI: soft, nontender, not distended, no mass or organomegaly. Normally active bowel sounds.  Musculoskeletal/ Extremities: without pitting edema, cords, tenderness Neuro: no peripheral neuropathy. Otherwise nonfocal. PSYCH appropriate mood and affect Skin without rash, ecchymosis, petechiae   Lab Results:  Results for orders placed or performed in visit on 09/30/15  CBC with Differential  Result Value Ref Range   WBC 6.0 3.9 - 10.3 10e3/uL   NEUT# 2.9 1.5 - 6.5 10e3/uL   HGB 13.8 11.6 - 15.9 g/dL   HCT 41.4 34.8 - 46.6 %   Platelets 278 145 - 400 10e3/uL   MCV 95.6 79.5 - 101.0 fL   MCH 31.9 25.1 - 34.0 pg   MCHC 33.3 31.5 - 36.0 g/dL   RBC 4.33 3.70 - 5.45 10e6/uL   RDW 13.8 11.2 - 14.5 %   lymph# 2.4 0.9 - 3.3 10e3/uL   MONO# 0.6 0.1 - 0.9 10e3/uL   Eosinophils Absolute 0.1 0.0 - 0.5 10e3/uL   Basophils Absolute 0.0 0.0 - 0.1 10e3/uL   NEUT% 47.4 38.4 - 76.8 %   LYMPH% 40.3 14.0 - 49.7 %   MONO% 9.8 0.0 - 14.0 %   EOS% 1.8 0.0 - 7.0 %   BASO% 0.7 0.0 - 2.0 %  Comprehensive metabolic panel  Result Value Ref Range   Sodium 139 136 - 145 mEq/L   Potassium 4.1 3.5 - 5.1 mEq/L   Chloride 102 98 - 109 mEq/L   CO2 27 22 - 29 mEq/L   Glucose 91 70 - 140 mg/dl   BUN 11.4 7.0 - 26.0 mg/dL   Creatinine 0.8 0.6 - 1.1 mg/dL   Total Bilirubin 0.55 0.20 - 1.20 mg/dL   Alkaline Phosphatase 79 40 - 150 U/L   AST 21 5 - 34 U/L   ALT 14 0 - 55 U/L   Total Protein 7.7 6.4 - 8.3 g/dL   Albumin 4.1 3.5 - 5.0 g/dL   Calcium 9.6 8.4 - 10.4 mg/dL   Anion Gap 10 3 - 11 mEq/L   EGFR 77 (L) >90 ml/min/1.73 m2  CA 125  Result Value Ref Range   Cancer Antigen (CA) 125 21.0 0.0 - 38.1 U/mL   CA 125 available after visit, compares with 26 on 07-01-15 and 26 on 05-10-15.  Studies/Results:  No results  found.  Medications: I have reviewed the patient's current medications. She does not feel she needs symptomatic medication for peripheral neuropathy in feet  Message to [facility] pharmacists re any additional post splenectomy immunizations now   DISCUSSION  Clinically doing well. Encouraged her to continue this good exercise program, as weight loss to ideal appropriate. She will see Dr Skeet Latch in August, with CT AP shortly prior and repeat CA 125 with that visit. I will see her in Nov, or sooner if needed.   If additional post splenectomy vaccines are needed, we will be in touch with patient about those.   MD reveiwed niraparib (zejula) indications: patient is now out 4 months from last carbo taxol (niraparib recommended to  start within 8 weeks of last chemo), and not clear to me how the surgical resection factors into this. Did not discuss with patient, but will mention this to Dr Skeet Latch.    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 Recurrence with 9 cm mass in splenic hilum at splenectomy with resection of involved pancreatic tail 02-02-15, CA 125 elevated at 88 preoperatively, now in normal range. Received 4 cycles dose dense carbo taxol thru 06-02-15. CT questioned area in right hepatic lobe otherwise good, PET 07-14-15 no evidence of metastatic disease including in area that appears to be cyst right hepatic lobe.  Does not seem to meet criteria for niraparib now, tho I will mention this to Dr Skeet Latch. Not discussed with patient.  Next appointment with Dr Skeet Latch Aug, with CT AP ordered shortly prior 2.Post splenectomy: vaccines given 12-2014. She knows to be in touch with MD immediately if fever/ infectious illness, as low threshold for antibiotics. She may need additional immunizations based on most current recommendations, message  to [facility] pharmacists to determine this, and will need to set up if so.  3. Deconditioning with surgery and chemo improving, now doing 4 miles on treadmill daily and some intentional weight loss occurring. 4. mammograms up to date and not remarkable 5. No colonoscopy: consider referral after PET/ gyn onc reevaluation 6.slight preexisting peripheral neuropathy fingertips, not worse with this chemo, symptoms in feet stable. 7. 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 TIme spent 25 min including >50% counseling and coordination of care. [facility] PCP and gyn oncology    Gordy Levan, MD   10/02/2015, 12:43 PM

## 2015-09-30 NOTE — Telephone Encounter (Signed)
appt made and avs printed °

## 2015-10-01 ENCOUNTER — Telehealth: Payer: Self-pay

## 2015-10-01 LAB — CA 125: CANCER ANTIGEN (CA) 125: 21 U/mL (ref 0.0–38.1)

## 2015-10-01 NOTE — Telephone Encounter (Signed)
Told Ms. Gladish that her CA-125 was in a good low range at 21 on 09-30-15.

## 2015-10-01 NOTE — Telephone Encounter (Signed)
-----   Message from Gordy Levan, MD sent at 10/01/2015 11:23 AM EDT ----- Labs seen and need follow up: please let her know CA 125 marker still in good normal range

## 2015-10-02 DIAGNOSIS — T451X5A Adverse effect of antineoplastic and immunosuppressive drugs, initial encounter: Secondary | ICD-10-CM

## 2015-10-02 DIAGNOSIS — G620 Drug-induced polyneuropathy: Secondary | ICD-10-CM | POA: Insufficient documentation

## 2015-10-06 ENCOUNTER — Telehealth: Payer: Self-pay

## 2015-10-06 ENCOUNTER — Telehealth: Payer: Self-pay | Admitting: Oncology

## 2015-10-06 ENCOUNTER — Other Ambulatory Visit: Payer: Self-pay | Admitting: Oncology

## 2015-10-06 DIAGNOSIS — Z9081 Acquired absence of spleen: Secondary | ICD-10-CM

## 2015-10-06 NOTE — Telephone Encounter (Signed)
LM in Shelley Sanchez's voice mail stating the rationale for injection appointments  for vaccines as noted below by Dr. Marko Plume. She can call Dr. [name] office if she has any questions or concerns.

## 2015-10-06 NOTE — Telephone Encounter (Signed)
Spoke withpt to confirm inj appts date/times per LL 7/12 pof

## 2015-10-06 NOTE — Addendum Note (Signed)
Addended by: Tora Kindred on: 10/06/2015 03:46 PM   Modules accepted: Orders

## 2015-10-06 NOTE — Telephone Encounter (Signed)
-----   Message from Gordy Levan, MD sent at 10/06/2015  9:07 AM EDT ----- Needs one more set of vaccines now and then one additional in a month, due to the splenectomy. I have put in orders and POF. RN please let patient know what this is about, and also tell her that she will need booster vaccines in 5 years, so we can all keep up with this. thanks

## 2015-10-06 NOTE — Telephone Encounter (Signed)
Medical Oncology  Patient received correct pre-splenectomy vaccines, however has not had subsequent vaccines.  Per pharmacist, present splenectomy protocol: Two weeks prior to surgery/splenectomy:   Hib - Haemophilus influenza b conjugate   PCV13 - Pneumococcal   Men ACWY - Meningococcal conjugate [Menactra or Menveo]    2 Months after hospital discharge:   PPSV23 - Pneumococcal   MenACWY - Meningococcal conjugate [Menactra or Menveo]   MenB-4C - Meningococcal B [Bexsero]    3 months after hospital discharge:   MenB-4C - Meningococcal B [Bexsero]    5 years after surgery/splenectomy:   PPSV23 - Pneumococcal   MenACWY - Meningococcal conjugate [Menactra or Menveo]    This MD spoke with infectious disease physician, who agrees with plan to proceed now with the "2 month" vaccines and next month with the "3 month" vaccines. We will be in touch with patient to discuss and schedule.  Peggye Fothergill, MD

## 2015-10-12 ENCOUNTER — Telehealth: Payer: Self-pay

## 2015-10-12 ENCOUNTER — Ambulatory Visit (HOSPITAL_BASED_OUTPATIENT_CLINIC_OR_DEPARTMENT_OTHER): Payer: BLUE CROSS/BLUE SHIELD

## 2015-10-12 VITALS — BP 120/56 | HR 63 | Temp 98.7°F

## 2015-10-12 DIAGNOSIS — Z23 Encounter for immunization: Secondary | ICD-10-CM | POA: Diagnosis not present

## 2015-10-12 DIAGNOSIS — Z9081 Acquired absence of spleen: Secondary | ICD-10-CM

## 2015-10-12 MED ORDER — MENINGOCOCCAL VAC B (OMV) IM SUSY
0.5000 mL | PREFILLED_SYRINGE | Freq: Once | INTRAMUSCULAR | Status: AC
  Administered 2015-10-12: 0.5 mL via INTRAMUSCULAR
  Filled 2015-10-12: qty 0.5

## 2015-10-12 MED ORDER — MENINGOCOCCAL A C Y&W-135 CONJ IM INJ: 0.5000 mL | INJECTION | Freq: Once | INTRAMUSCULAR | Status: DC

## 2015-10-12 NOTE — Telephone Encounter (Signed)
Left a message for Shelley Sanchez to fax a medical release form  to Dr. [name] nurse at [phone].  The most recent office note from 10-02-15 can be faxed to her with updated information.

## 2015-10-12 NOTE — Patient Instructions (Signed)
Meningococcal Polysaccharide Vaccine injection What is this medicine? MENINGOCOCCAL POLYSACCHARIDE VACCINE (muh ning goh KOK kal vak SEEN) is a vaccine to protect from bacterial meningitis. This vaccine does not contain live bacteria. It will not cause a meningitis. This medicine may be used for other purposes; ask your health care provider or pharmacist if you have questions. What should I tell my health care provider before I take this medicine? They need to know if you have any of these conditions: -fever or infection -history of Guillain-Barre syndrome -immune system problems -an unusual or allergic reaction to meningococcal vaccine, latex, other medicines, foods, dyes, or preservatives -pregnant or trying to get pregnant -breast-feeding How should I use this medicine? This medicine is for injection under the skin. It is given by a health care professional in a hospital or clinic setting. A copy of the Vaccine Information Statements will be given before each vaccination. Read this sheet carefully each time. The sheet may change frequently. Talk to your pediatrician regarding the use of this medicine in children. While this drug may be prescribed for children as young as 32 years of age for selected conditions, precautions do apply. Overdosage: If you think you have taken too much of this medicine contact a poison control center or emergency room at once. NOTE: This medicine is only for you. Do not share this medicine with others. What if I miss a dose? This does not apply. What may interact with this medicine? -adalimumab -anakinra -etanercept -infliximab -medicines for organ transplant -medicines to treat cancer -other vaccines -some medicines for arthritis -steroid medicines like prednisone or cortisone This list may not describe all possible interactions. Give your health care provider a list of all the medicines, herbs, non-prescription drugs, or dietary supplements you use. Also  tell them if you smoke, drink alcohol, or use illegal drugs. Some items may interact with your medicine. What should I watch for while using this medicine? This vaccine, like all vaccines, may not fully protect everyone. Report any side effects that are worrisome to your doctor right away. What side effects may I notice from receiving this medicine? Side effects that you should report to your doctor or health care professional as soon as possible: -allergic reactions like skin rash, itching or hives, swelling of the face, lips, or tongue -breathing problems -feeling faint or lightheaded, falls -fever over 102 degrees F -muscle weakness -unusual drooping or paralysis of face Side effects that usually do not require medical attention (Report these to your doctor or health care professional if they continue or are bothersome.): -chills -diarrhea -headache -loss of appetite -muscle aches and pains -pain at site where injected -tired This list may not describe all possible side effects. Call your doctor for medical advice about side effects. You may report side effects to FDA at 1-800-FDA-1088. Where should I keep my medicine? This drug is given in a hospital or clinic and will not be stored at home. NOTE: This sheet is a summary. It may not cover all possible information. If you have questions about this medicine, talk to your doctor, pharmacist, or health care provider.    2016, Elsevier/Gold Standard. (2014-04-03 11:44:09) Meningococcal Haemophilus influenzae type b Conjugate Vaccine What is this medicine? MENINGOCOCCAL HAEMOPHILUS INFLUENZAE TYPE B CONJUGATE VACCINE (muh ning goh KOK kal hem OFF fil [place] in floo En zuh CON ju gate ed vak SEEN) is a vaccine to protect against bacterial meningitis and prevent infections of the Haemophilus bacteria. This vaccine does not contain live bacteria.  It will not cause a meningitis. This medicine may be used for other purposes; ask your health care  provider or pharmacist if you have questions. What should I tell my health care provider before I take this medicine? They need to know if you have any of these conditions: -fever or infection -history of Guillain-Barre syndrome -immune system problems -an unusual or allergic reaction to vaccines, other medicines, foods, dyes, or preservatives -pregnant or trying to get pregnant -breast-feeding How should I use this medicine? This vaccine is for injection into a muscle. It is given by a health care professional. A copy of Vaccine Information Statements will be given before each vaccination. Read this sheet carefully each time. The sheet may change frequently. Talk to your pediatrician regarding the use of this medicine in children. While this drug may be prescribed for children as young as 15 weeks old for selected conditions, precautions do apply. Overdosage: If you think you have taken too much of this medicine contact a poison control center or emergency room at once. NOTE: This medicine is only for you. Do not share this medicine with others. What if I miss a dose? Keep appointments for follow-up doses as directed. It is important not to miss your dose. Call your doctor or health care professional if you are unable to keep an appointment. What may interact with this medicine? -medicines that lower your chance of fighting infection -medicines to treat cancer -steroid medicines like prednisone or cortisone This list may not describe all possible interactions. Give your health care provider a list of all the medicines, herbs, non-prescription drugs, or dietary supplements you use. Also tell them if you smoke, drink alcohol, or use illegal drugs. Some items may interact with your medicine. What should I watch for while using this medicine? Visit your doctor for regular check-ups as directed. This vaccine, like all vaccines, may not fully protect everyone. What side effects may I notice from  receiving this medicine? Side effects that you should report to your doctor or health care professional as soon as possible: -allergic reactions like skin rash, itching or hives, swelling of the face, lips, or tongue -breathing problems -feeling faint or lightheaded, falls -high fever -muscle weakness -seizures -unusually weak or tired Side effects that usually do not require medical attention (Report these to your doctor or health care professional if they continue or are bothersome.): -irritable -loss of appetite -low-grade fever -pain, redness, or irritation at site where injected -tiredness This list may not describe all possible side effects. Call your doctor for medical advice about side effects. You may report side effects to FDA at 1-800-FDA-1088. Where should I keep my medicine? This drug is given in a hospital or clinic and will not be stored at home. NOTE: This sheet is a summary. It may not cover all possible information. If you have questions about this medicine, talk to your doctor, pharmacist, or health care provider.    2016, Elsevier/Gold Standard. (2011-11-23 17:10:42)

## 2015-10-15 NOTE — Telephone Encounter (Signed)
Have not received records release from Case manager

## 2015-11-09 ENCOUNTER — Ambulatory Visit: Payer: BLUE CROSS/BLUE SHIELD

## 2015-11-16 ENCOUNTER — Ambulatory Visit (HOSPITAL_BASED_OUTPATIENT_CLINIC_OR_DEPARTMENT_OTHER): Payer: BLUE CROSS/BLUE SHIELD

## 2015-11-16 DIAGNOSIS — Z23 Encounter for immunization: Secondary | ICD-10-CM

## 2015-11-16 DIAGNOSIS — Z9081 Acquired absence of spleen: Secondary | ICD-10-CM

## 2015-11-16 MED ORDER — MENINGOCOCCAL VAC B (OMV) IM SUSY
0.5000 mL | PREFILLED_SYRINGE | Freq: Once | INTRAMUSCULAR | Status: AC
  Administered 2015-11-16: 0.5 mL via INTRAMUSCULAR
  Filled 2015-11-16: qty 0.5

## 2015-11-16 NOTE — Patient Instructions (Signed)
Serogroup B Meningococcal Vaccine (MenB): What You Need to Know 1. Why get vaccinated? Meningococcal disease is a serious illness caused by a type of bacteria called Neisseria meningitidis. It can lead to meningitis (infection of the lining of the brain and spinal cord) and infections of the blood. Meningococcal disease often occurs without warning--even among people who are otherwise healthy. Meningococcal disease can spread from person to person through close contact (coughing or kissing) or lengthy contact, especially among people living in the same household. There are at least 12 types of N. meningitidis, called "serogroups." Serogroups A, B, C, W, and Y cause most meningococcal disease. Anyone can get meningococcal disease but certain people are at increased risk, including:  Infants younger than one year old  Adolescents and young adults 43 through 61 years old  People with certain medical conditions that affect the immune system  Microbiologists who routinely work with isolates of N. meningitidis  People at risk because of an outbreak in their community Even when it is treated, meningococcal disease kills 10 to 15 infected people out of 100. And of those who survive, about 10 to 20 out of every 100 will suffer disabilities such as hearing loss, brain damage, kidney damage, amputations, nervous system problems, or severe scars from skin grafts. Serogroup B meningococcal (MenB) vaccines can help prevent meningococcal disease caused by serogroup B. Other meningococcal vaccines are recommended to help protect against serogroups A, C, W, and Y. 2. Serogroup B Meningococcal Vaccines Two serogroup B meningococcal vaccines--Bexsero and Trumenba--have been licensed by the [employer] and Drug Administration (FDA). These vaccines are recommended routinely for people 10 years or older who are at increased risk for serogroup B meningococcal infections, including:  People at risk because of a serogroup B  meningococcal disease outbreak  Anyone whose spleen is damaged or has been removed  Anyone with a rare immune system condition called "persistent complement component deficiency"  Anyone taking a drug called eculizumab (also called Soliris)  Microbiologists who routinely work with isolates of N. meningitidis These vaccines may also be given to anyone 75 through 61 years old to provide short term protection against most strains of serogroup B meningococcal disease; 16 through 18 years are the preferred ages for vaccination. For best protection, more than 1 dose of a serogroup B meningococcal vaccine is needed. The same vaccine must be used for all doses. Ask your health care provider about the number and timing of doses. 3. Some people should not get these vaccines Tell the person who is giving you the vaccine:  If you have any severe, life-threatening allergies. If you have ever had a life-threatening allergic reaction after a previous dose of serogroup B meningococcal vaccine, or if you have a severe allergy to any part of this vaccine, you should not get the vaccine. Tell your health care provider if you any severe allergies that you know of, including a severe allergy to latex. He or she can tell you about the vaccine's ingredients.  If you are pregnant or breastfeeding. There is not very much information about the potential risks of this vaccine for a pregnant woman or breastfeeding mother. It should be used during pregnancy only if clearly needed. If you have a mild illness, such as a cold, you can probably get the vaccine today. If you are moderately or severely ill, you should probably wait until you recover. Your doctor can advise you. 4. Risks of a vaccine reaction With any medicine, including vaccines, there is a chance  of reactions. These are usually mild and go away on their own within a few days, but serious reactions are also possible. More than half of the people who get  serogroup B meningococcal vaccine have mild problems following vaccination. These reactions can last up to 3 to 7 days, and include:  Soreness, redness, or swelling where the shot was given  Tiredness or fatigue  Headache  Muscle or joint pain  Fever or chills  Nausea or diarrhea Other problems that could happen after these vaccines:  People sometimes faint after a medical procedure, including vaccination. Sitting or lying down for about 15 minutes can help prevent fainting, and injuries caused by a fall. Tell your provider if you feel dizzy, or have vision changes or ringing in the ears.  Some people get shoulder pain that can be more severe and longer-lasting than the more routine soreness that can follow injections This happens very rarely.  Any medication can cause a severe allergic reaction. Such reactions from a vaccine are very rare, estimated at about 1 in a million doses, and would happen within a few minutes to a few hours after the vaccination. As with any medicine, there is a very remote chance of a vaccine causing a serious injury or death. The safety of vaccines is always being monitored. For more information, visit: http://www.aguilar.org/ 5. What if there is a serious reaction? What should I look for?  Look for anything that concerns you, such as signs of a severe allergic reaction, very high fever, or unusual behavior. Signs of a severe allergic reaction can include hives, swelling of the face and throat, difficulty breathing, a fast heartbeat, dizziness, and weakness. These would usually start a few minutes to a few hours after the vaccination. What should I do?  If you think it is a severe allergic reaction or other emergency that can't wait, call 9-1-1 and get to the nearest hospital. Otherwise, call your clinic. Afterward the reaction should be reported to the Vaccine Adverse Event Reporting System (VAERS). Your doctor should file this report, or you can do it  yourself through the VAERS web site at www.vaers.SamedayNews.es, or by calling 782-050-8846. VAERS does not give medical advice. 6. The National Vaccine Injury Compensation Program The [employer] Vaccine Injury Compensation Program (VICP) is a federal program that was created to compensate people who may have been injured by certain vaccines. Persons who believe they may have been injured by a vaccine can learn about the program and about filing a claim by calling 239-670-0571 or visiting the [place] website at GoldCloset.com.ee. There is a time limit to file a claim for compensation. 7. How can I learn more?  Ask your health care provider. He or she can give you the vaccine package insert or suggest other sources of information.  Call your local or state health department.  Contact the Centers for Disease Control and Prevention (CDC): - Call 639-110-3610 (1-800-CDC-INFO) or - Visit CDC's website at http://hunter.com/ Vaccine Information Statement Serogroup B Meningococcal Vaccine (11/03/2014)   This information is not intended to replace advice given to you by your health care provider. Make sure you discuss any questions you have with your health care provider.   Document Released: 11/14/2013 Document Revised: 12/02/2014 Document Reviewed: 11/14/2013 Elsevier Interactive Patient Education [employer].

## 2015-11-22 NOTE — Progress Notes (Signed)
Office Visit Note: Gyn-Onc  Shelley Sanchez 60 y.o. female  CC:  Ovarian cancer  urveillance  Assessment/Plan:  60 y.o. who underwent optimal debulking on 04/09/2012. 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 She completed six cycles dose dense.  TaxolCarboplatin therapy 08/2012. 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  Treated with splenectomy taxol/carboplatin completed in March 2017  Close follow-up with CA125.  PET imaging if CA 125 increases  Follow-up in 3 months    HPI: 60 y.o. G3 P3 LNMP in early 22's. In October 2013 Shelley Sanchez noted fatigue, and fecal urge incontinence. Also noted increasing abdominal girth. No change in appetite, no nausea or vomiting. CT Chest/Abd/Pelvis 04/01/2012 notable for ascites so patient was admitted for evaluation. Paracentesis performed 04/01/2012. Results were negative for malignancy.  CA -125 at the time of presentation was 2418.   [name] underwent exploratory laparotomy total abdominal hysterectomy bilateral salpingo-oophorectomy omentectomy radical debulking to optimal disease on 04/09/2012.  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 The only gross disease present was a 7 mm lesion in the posterior most aspect of the liver.  Path consistent with stage IIIc high grade serous cancer.  She received  six cycles of dose dense Taxol carboplatin therapy completed 09/10/2012   Rising CA 125 noted in 2016.   CT CAP 01-11-15 found mass at anterior aspect of spleen 11.5 x 8.2 x 13 cm, with stable unremarkable chest, no ascites, no adenopathy, no pelvic masses. 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 There was no visualized abdominal carcinomatosis; tail of pancreas was adherent to mass and taken with spleen. She received cycle 5  dense carbo taxol on  05/2015.  PET 07/12/2015 IMPRESSION: No evidence of metastatic disease. Specifically, no abnormal hypermetabolism in the liver to correspond to the reported abnormality on 06/22/2015. Doing very well  PET 10/2015  IMPRESSION: 1. No definite recurrent metastatic disease in the abdomen or pelvis. 2. Solitary mildly enlarged right paracaval lymph node is stable in size since 07/14/2015 PET-CT, where it appeared non hypermetabolic, suggesting a benign node. Recommend continued attention to this node on follow-up CT or PET-CT.  Last Ca 125 10/2015 22.8  ROS:  No change in bowel or bladder habits, no nausea or emesis, no vaginal bleeding, no SOB no chest pain otherwise 10 point ROS negative  Social Hx:  Resides with her significant other, works full time, overall happy  Past Surgical Hx:  Past Surgical History:  Procedure Laterality Date  . ABDOMINAL HYSTERECTOMY  04/09/2012   Procedure: HYSTERECTOMY ABDOMINAL;  Surgeon: [name], MD PHD;  Location: [facility];  Service: Gynecology;  Laterality: N/A;  . BREAST ENHANCEMENT SURGERY  2000  . LAPAROTOMY  04/09/2012   Procedure: EXPLORATORY LAPAROTOMY;  Surgeon: [name], MD PHD;  Location: [facility];  Service: Gynecology;  Laterality: N/A;  EXPLORATORY LAPAROTOMY, TOTAL ABDOMINAL HYESTERCTOMY, BILATERAL SALPINGO OOPHERECTOMY  . mohs procedure to right side of base of nose       Past Medical Hx:  Past Medical History:  Diagnosis Date  . Ascites 03/2012   on CT a/p; s/p paracentesis  . Family history of ovarian cancer   . Headache   . Hyperthyroidism   . Ovarian cancer ([place]) 03/2012 dx   s/p debulk = stage III C poor diff, ov serous ca  . 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  Squamous cell carcinoma of right hand 2006   s/p excision  Genetic testing was normal and did not reveal a mutation in these genes. The genes tested were ATM, BARD1, BRCA1, BRCA2, BRIP1, CDH1, CHEK2, EPCAM, MLH1, MRE11A, MSH2, MSH6, MUTYH,  NBN, NF1, PALB2, PMS2, PTEN, RAD50, RAD51C, RAD51D, SMARCA4, STK11, and TP53.   Family Hx:  Family History  Problem Relation Age of Onset  . Melanoma Mother 51    on her back; TAH/BSO in ~30 d/t bleeding  . Skin cancer Brother 56    squamous cell  . Thyroid cancer Brother 51    s/p thyroidectomy  . Ovarian cancer Maternal Aunt 80    "abd ca" possible ovarian; deceased 60s  . Rheum arthritis Mother   . Macular degeneration Mother    PHYSICAL EXAMINATION Vitals BP 115/73 (BP Location: Left Arm, Patient Position: Sitting)   Pulse 62   Temp 98.1 F (36.7 C) (Oral)   Resp 18   Wt 165 lb 9.6 oz (75.1 kg)   SpO2 100%   BMI 25.18 kg/m  About on the right. There is Wt Readings from Last 3 Encounters:  11/25/15 165 lb 9.6 oz (75.1 kg)  09/30/15 165 lb 14.4 oz (75.3 kg)  07/29/15 166 lb 11.2 oz (75.6 kg)   Neck  Supple without any enlargements.  Cardiovascular  Pulse normal rate, regularity and rhythm.  Lungs  Clear to auscultation bilaterally  Psychiatry  Alert and oriented appropriate mood and affect.  Abdomen  Normoactive bowel sounds, abdomen soft, non-tender and obese. Surgical  sites intact without evidence of hernia or tenderness Genito Urinary  Deferred Good tone, no masses no cul de sac nodularity.  Extremities  No bilateral cyanosis, clubbing or edema. No rash,

## 2015-11-23 ENCOUNTER — Ambulatory Visit (HOSPITAL_COMMUNITY)
Admission: RE | Admit: 2015-11-23 | Discharge: 2015-11-23 | Disposition: A | Discharge status: Home / Self Care | Payer: BLUE CROSS/BLUE SHIELD | Source: Ambulatory Visit | Attending: Oncology | Admitting: Oncology

## 2015-11-23 ENCOUNTER — Other Ambulatory Visit (HOSPITAL_BASED_OUTPATIENT_CLINIC_OR_DEPARTMENT_OTHER): Payer: BLUE CROSS/BLUE SHIELD

## 2015-11-23 DIAGNOSIS — C561 Malignant neoplasm of right ovary: Secondary | ICD-10-CM

## 2015-11-23 DIAGNOSIS — R590 Localized enlarged lymph nodes: Secondary | ICD-10-CM | POA: Diagnosis not present

## 2015-11-23 DIAGNOSIS — C7989 Secondary malignant neoplasm of other specified sites: Secondary | ICD-10-CM | POA: Diagnosis not present

## 2015-11-23 DIAGNOSIS — K7689 Other specified diseases of liver: Secondary | ICD-10-CM | POA: Diagnosis not present

## 2015-11-23 LAB — COMPREHENSIVE METABOLIC PANEL
ALT: 14 U/L (ref 0–55)
AST: 18 U/L (ref 5–34)
Albumin: 3.8 g/dL (ref 3.5–5.0)
Alkaline Phosphatase: 76 U/L (ref 40–150)
Anion Gap: 11 mEq/L (ref 3–11)
BUN: 17.9 mg/dL (ref 7.0–26.0)
CHLORIDE: 102 meq/L (ref 98–109)
CO2: 27 meq/L (ref 22–29)
CREATININE: 0.8 mg/dL (ref 0.6–1.1)
Calcium: 9.9 mg/dL (ref 8.4–10.4)
EGFR: 76 mL/min/{1.73_m2} — ABNORMAL LOW (ref >90)
Glucose: 107 mg/dl (ref 70–140)
Potassium: 4.1 mEq/L (ref 3.5–5.1)
Sodium: 140 mEq/L (ref 136–145)
Total Bilirubin: 0.34 mg/dL (ref 0.20–1.20)
Total Protein: 7.8 g/dL (ref 6.4–8.3)

## 2015-11-23 MED ORDER — IOPAMIDOL (ISOVUE-300) INJECTION 61%
100.0000 mL | Freq: Once | INTRAVENOUS | Status: AC | PRN
  Administered 2015-11-23: 100 mL via INTRAVENOUS

## 2015-11-24 LAB — CA 125: CANCER ANTIGEN (CA) 125: 22.8 U/mL (ref 0.0–38.1)

## 2015-11-25 ENCOUNTER — Other Ambulatory Visit: Payer: BLUE CROSS/BLUE SHIELD

## 2015-11-25 ENCOUNTER — Encounter: Payer: Self-pay | Admitting: Gynecologic Oncology

## 2015-11-25 ENCOUNTER — Ambulatory Visit: Payer: BLUE CROSS/BLUE SHIELD | Attending: Gynecologic Oncology | Admitting: Gynecologic Oncology

## 2015-11-25 VITALS — BP 115/73 | HR 62 | Temp 98.1°F | Resp 18 | Wt 165.6 lb

## 2015-11-25 DIAGNOSIS — C561 Malignant neoplasm of right ovary: Secondary | ICD-10-CM

## 2015-11-25 DIAGNOSIS — Z8543 Personal history of malignant neoplasm of ovary: Secondary | ICD-10-CM | POA: Diagnosis not present

## 2015-11-25 DIAGNOSIS — R188 Other ascites: Secondary | ICD-10-CM | POA: Insufficient documentation

## 2015-11-25 DIAGNOSIS — Z85828 Personal history of other malignant neoplasm of skin: Secondary | ICD-10-CM | POA: Insufficient documentation

## 2015-11-25 DIAGNOSIS — C259 Malignant neoplasm of pancreas, unspecified: Secondary | ICD-10-CM | POA: Diagnosis not present

## 2015-11-25 DIAGNOSIS — Z9221 Personal history of antineoplastic chemotherapy: Secondary | ICD-10-CM

## 2015-11-25 DIAGNOSIS — E0590 Thyrotoxicosis, unspecified without thyrotoxic crisis or storm: Secondary | ICD-10-CM | POA: Insufficient documentation

## 2015-11-25 NOTE — Patient Instructions (Signed)
We will continue to monitor your CA 125.  Please call for any new symptoms.  Plan to follow up in November 2017.

## 2015-12-03 ENCOUNTER — Telehealth: Payer: Self-pay

## 2015-12-03 NOTE — Telephone Encounter (Signed)
Spoke with Shelley Sanchez about clarification for need of a PET scan after her visit with Dr. Skeet Latch on 11-25-15.and CT A/P on 11-23-15. Told her that Dr. Marko Plume reviewed the scan and Dr. Leone Brand note. She said that her CA-125 is in a good low range and that there is nothing of concern seen on CT scan. A Pet scan would be ordered if she has a rise in the CA-125 and or symptoms of recurrent  Disease. Pt appreciative of the clarification.

## 2016-01-18 ENCOUNTER — Encounter: Payer: Self-pay | Admitting: Oncology

## 2016-01-18 ENCOUNTER — Other Ambulatory Visit: Payer: Self-pay | Admitting: Oncology

## 2016-01-18 ENCOUNTER — Ambulatory Visit (HOSPITAL_BASED_OUTPATIENT_CLINIC_OR_DEPARTMENT_OTHER): Payer: BLUE CROSS/BLUE SHIELD

## 2016-01-18 DIAGNOSIS — Z9081 Acquired absence of spleen: Secondary | ICD-10-CM

## 2016-01-18 DIAGNOSIS — Z23 Encounter for immunization: Secondary | ICD-10-CM | POA: Diagnosis not present

## 2016-01-18 MED ORDER — PNEUMOCOCCAL 13-VAL CONJ VACC IM SUSP
0.5000 mL | Freq: Once | INTRAMUSCULAR | Status: DC
  Administered 2016-01-18: 0.5 mL via INTRAMUSCULAR
  Filled 2016-01-18: qty 0.5

## 2016-01-18 NOTE — Patient Instructions (Signed)
Pneumococcal Vaccine, Polyvalent suspension for injection  What is this medicine?  PNEUMOCOCCAL VACCINE (NEU mo KOK al vak SEEN) is a vaccine used to prevent pneumococcus bacterial infections. These bacteria can cause serious infections like pneumonia, meningitis, and blood infections. This vaccine will lower your chance of getting pneumonia. If you do get pneumonia, it can make your symptoms milder and your illness shorter. This vaccine will not treat an infection and will not cause infection. This vaccine is recommended for infants and young children, adults with certain medical conditions, and adults 65 years or older.  This medicine may be used for other purposes; ask your health care provider or pharmacist if you have questions.  What should I tell my health care provider before I take this medicine?  They need to know if you have any of these conditions:  -bleeding problems  -fever  -immune system problems  -an unusual or allergic reaction to pneumococcal vaccine, diphtheria toxoid, other vaccines, latex, other medicines, foods, dyes, or preservatives  -pregnant or trying to get pregnant  -breast-feeding  How should I use this medicine?  This vaccine is for injection into a muscle. It is given by a health care professional.  A copy of Vaccine Information Statements will be given before each vaccination. Read this sheet carefully each time. The sheet may change frequently.  Talk to your pediatrician regarding the use of this medicine in children. While this drug may be prescribed for children as young as 6 weeks old for selected conditions, precautions do apply.  Overdosage: If you think you have taken too much of this medicine contact a poison control center or emergency room at once.  NOTE: This medicine is only for you. Do not share this medicine with others.  What if I miss a dose?  It is important not to miss your dose. Call your doctor or health care professional if you are unable to keep an  appointment.  What may interact with this medicine?  -medicines for cancer chemotherapy  -medicines that suppress your immune function  -steroid medicines like prednisone or cortisone  This list may not describe all possible interactions. Give your health care provider a list of all the medicines, herbs, non-prescription drugs, or dietary supplements you use. Also tell them if you smoke, drink alcohol, or use illegal drugs. Some items may interact with your medicine.  What should I watch for while using this medicine?  Mild fever and pain should go away in 3 days or less. Report any unusual symptoms to your doctor or health care professional.  What side effects may I notice from receiving this medicine?  Side effects that you should report to your doctor or health care professional as soon as possible:  -allergic reactions like skin rash, itching or hives, swelling of the face, lips, or tongue  -breathing problems  -confused  -fast or irregular heartbeat  -fever over 102 degrees F  -seizures  -unusual bleeding or bruising  -unusual muscle weakness  Side effects that usually do not require medical attention (report to your doctor or health care professional if they continue or are bothersome):  -aches and pains  -diarrhea  -fever of 102 degrees F or less  -headache  -irritable  -loss of appetite  -pain, tender at site where injected  -trouble sleeping  This list may not describe all possible side effects. Call your doctor for medical advice about side effects. You may report side effects to FDA at 1-800-FDA-1088.  Where should I keep   my medicine?  This does not apply. This vaccine is given in a clinic, pharmacy, doctor's office, or other health care setting and will not be stored at home.  NOTE: This sheet is a summary. It may not cover all possible information. If you have questions about this medicine, talk to your doctor, pharmacist, or health care provider.     © 2016, Elsevier/Gold Standard. (2013-12-18  10:27:27)

## 2016-01-18 NOTE — Progress Notes (Signed)
Medical Oncology  RNs requesting clarification for injection planned today. Discussed with [facility] pharmacist, reviewed pre and post splenectomy vaccines as listed on immunizations and per my phone note 09-2015.  She should get Prevnar (pneumococcal 13) today, available in [place].  Godfrey Pick, MD

## 2016-02-20 ENCOUNTER — Other Ambulatory Visit: Payer: Self-pay | Admitting: Oncology

## 2016-02-21 ENCOUNTER — Other Ambulatory Visit: Payer: Self-pay | Admitting: Oncology

## 2016-02-21 ENCOUNTER — Ambulatory Visit (HOSPITAL_BASED_OUTPATIENT_CLINIC_OR_DEPARTMENT_OTHER): Payer: BLUE CROSS/BLUE SHIELD | Admitting: Oncology

## 2016-02-21 ENCOUNTER — Other Ambulatory Visit (HOSPITAL_BASED_OUTPATIENT_CLINIC_OR_DEPARTMENT_OTHER): Payer: BLUE CROSS/BLUE SHIELD

## 2016-02-21 ENCOUNTER — Encounter: Payer: Self-pay | Admitting: Oncology

## 2016-02-21 VITALS — BP 106/49 | HR 63 | Temp 97.8°F | Resp 16 | Ht 68.0 in | Wt 170.0 lb

## 2016-02-21 DIAGNOSIS — Z23 Encounter for immunization: Secondary | ICD-10-CM | POA: Diagnosis not present

## 2016-02-21 DIAGNOSIS — C569 Malignant neoplasm of unspecified ovary: Secondary | ICD-10-CM

## 2016-02-21 DIAGNOSIS — C7989 Secondary malignant neoplasm of other specified sites: Secondary | ICD-10-CM | POA: Diagnosis not present

## 2016-02-21 DIAGNOSIS — Z9081 Acquired absence of spleen: Secondary | ICD-10-CM | POA: Diagnosis not present

## 2016-02-21 DIAGNOSIS — C561 Malignant neoplasm of right ovary: Secondary | ICD-10-CM | POA: Diagnosis not present

## 2016-02-21 DIAGNOSIS — Z7185 Encounter for immunization safety counseling: Secondary | ICD-10-CM

## 2016-02-21 DIAGNOSIS — G620 Drug-induced polyneuropathy: Secondary | ICD-10-CM

## 2016-02-21 DIAGNOSIS — Z1239 Encounter for other screening for malignant neoplasm of breast: Secondary | ICD-10-CM

## 2016-02-21 DIAGNOSIS — T451X5A Adverse effect of antineoplastic and immunosuppressive drugs, initial encounter: Secondary | ICD-10-CM

## 2016-02-21 DIAGNOSIS — Z7189 Other specified counseling: Secondary | ICD-10-CM

## 2016-02-21 LAB — COMPREHENSIVE METABOLIC PANEL
ALT: 18 U/L (ref 0–55)
ANION GAP: 9 meq/L (ref 3–11)
AST: 33 U/L (ref 5–34)
Albumin: 4 g/dL (ref 3.5–5.0)
Alkaline Phosphatase: 77 U/L (ref 40–150)
BUN: 14.1 mg/dL (ref 7.0–26.0)
CALCIUM: 9.7 mg/dL (ref 8.4–10.4)
CHLORIDE: 102 meq/L (ref 98–109)
CO2: 28 mEq/L (ref 22–29)
Creatinine: 0.9 mg/dL (ref 0.6–1.1)
EGFR: 69 mL/min/{1.73_m2} — ABNORMAL LOW (ref >90)
Glucose: 93 mg/dl (ref 70–140)
POTASSIUM: 3.9 meq/L (ref 3.5–5.1)
Sodium: 139 mEq/L (ref 136–145)
Total Bilirubin: 0.58 mg/dL (ref 0.20–1.20)
Total Protein: 8 g/dL (ref 6.4–8.3)

## 2016-02-21 LAB — CBC WITH DIFFERENTIAL/PLATELET
BASO%: 1 % (ref 0.0–2.0)
BASOS ABS: 0.1 10*3/uL (ref 0.0–0.1)
EOS%: 1.1 % (ref 0.0–7.0)
Eosinophils Absolute: 0.1 10*3/uL (ref 0.0–0.5)
HEMATOCRIT: 42.2 % (ref 34.8–46.6)
HGB: 13.8 g/dL (ref 11.6–15.9)
LYMPH#: 2.3 10*3/uL (ref 0.9–3.3)
LYMPH%: 37.6 % (ref 14.0–49.7)
MCH: 31.8 pg (ref 25.1–34.0)
MCHC: 32.8 g/dL (ref 31.5–36.0)
MCV: 97 fL (ref 79.5–101.0)
MONO#: 0.7 10*3/uL (ref 0.1–0.9)
MONO%: 10.9 % (ref 0.0–14.0)
NEUT#: 3 10*3/uL (ref 1.5–6.5)
NEUT%: 49.4 % (ref 38.4–76.8)
Platelets: 272 10*3/uL (ref 145–400)
RBC: 4.35 10*6/uL (ref 3.70–5.45)
RDW: 14.3 % (ref 11.2–14.5)
WBC: 6.2 10*3/uL (ref 3.9–10.3)

## 2016-02-21 MED ORDER — INFLUENZA VAC SPLIT QUAD 0.5 ML IM SUSY
0.5000 mL | PREFILLED_SYRINGE | Freq: Once | INTRAMUSCULAR | Status: AC
  Administered 2016-02-21: 0.5 mL via INTRAMUSCULAR
  Filled 2016-02-21: qty 0.5

## 2016-02-21 NOTE — Progress Notes (Signed)
OFFICE PROGRESS NOTE   February 21, 2016   Physicians: W.Brewster, M.Wert, V.Leschber/ Billey Gosling , Stark Klein  Patient is assigned to Dr Quay Burow as PCP but has not set up appointment yet, which I have encouraged her to do.  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 Most recent imaging was PET 07-14-15 and CT AP 11-23-15. She saw Dr Skeet Latch 11-25-15 and will see her again in late Jan 2018. She is completing presently recommended vaccinations for splenectomy, with meningococcal B given 10-12-15 and 11-16-15, Prevnar 13 on 01-18-16, flu vaccine today, and will have pneumococcal 23 with Dr Leone Brand visit in Jan.   Patient continues to feel entirely well, with good appetite and energy, no pain, no GI symptoms or abdominal discomfort/ bloating, no respiratory symptoms, no fever or recent infectious illness, no bladder sympotms, no LE swelling, no bleeding. She walks 4 miles daily on treadmill at home and works from home, so less exposure to potential infectious illness.  Remainder of 10 point Review of Systems negative  No PAC. All of previous chemo done peripheral Genetics testing normal 08-2013 (Ova[employer] panel) Pneumovax 23, meningococcal vaccine "Menomune" and Hemophilus B all 01-14-15. MESSAGE TO [place] PHARMACIST NOW ? 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 By Marcene Duos she could not climb one flight of stairs and could walk only from office to car without SOB and coughing. She was seen at urgent care in Dec with bilateral pleural effusions  on CXR, referred to Dr Clois Comber, with 1.6 liter right thoracentesis done 03-11-12 (no path in this EMR). She had CT CAP and CT angio chest with large bilateral pleural effusions, no PE, ascites and pelvic mass. She had paracentesis for 1.4 liters on 04-02-12 and left thoracentesis for 2 liters on 04-05-12. CA 125 was 2418. She was seen in consultation by Dr Skeet Latch, with TAH/BSO, radical debulking and omentectomy on 04-09-2012, which was optimal debulking with only residual at completion of procedure being 7 mm area on posterior surface of right lobe liver. She was transfused 2 units PRBCs on 04-10-12 for Hgb down to 7.2 (from 12 preop). Pathology [id number]) high grade serous carcinoma involving right pelvic mass, serosa of uterus and peritoneum, omentum negative, no nodes submitted, felt IIIC right ovarian primary. Adjuvant therapy with dose dense taxol/ carboplatin began 05-03-12, with CA125 down to 30 by early April. Cycle 6 was completed 09-10-12. CA 125 remained in good range, including 14 in 03-2009 and 25 on 09-24-14, however was 88 in early Oct 2016. CT CAP 01-11-15 found mass at anterior aspect of spleen 11.5 x 8.2 x 13 cm, with stable unremarkable chest, no ascites, no adenopathy, no pelvic masses. She had splenectomy by Dr Barry Dienes 02-02-15, pathology [id number]) high grade serous carcinoma involving splenic capsule and parenchyma, 9 cm. There was no visualized abdominal carcinomatosis; tail of pancreas was adherent to mass and taken with spleen. She began dose dense carbo taxol on 03-16-15, required addition of granix for leukopenia, total 4 cycles given thru 06-02-15. PET 07-14-15 and CT AP 11-23-15 no residual or progressive disease.  .  Objective:  Vital signs in last 24 hours:  BP (!) 106/49 (BP Location: Left Arm, Patient Position: Sitting) Comment: Made R.N. aware of low BP  Pulse 63   Temp 97.8 F (36.6 C) (Oral)   Resp 16   Ht '5\' 8"'  (1.727 m)   Wt 170 lb (77.1 kg)   SpO2 97%   BMI 25.85 kg/m   Weight up 4 lbs. Alert, oriented and appropriate. Ambulatory without difficulty.  No alopecia, hair grew back curly.  HEENT:PERRL, sclerae not icteric. Oral mucosa moist without lesions, posterior pharynx clear.  Neck supple. No JVD.  Lymphatics:no cervical,supraclavicular, axillary or inguinal adenopathy Resp: clear to auscultation bilaterally and normal percussion bilaterally Cardio: regular rate and rhythm. No gallop. GI: soft, nontender, not distended, no mass or organomegaly. Normally active bowel sounds. Surgical incision not remarkable. Musculoskeletal/ Extremities: without pitting edema, cords, tenderness Neuro: no significant peripheral neuropathy. Otherwise nonfocal. PSYCH appropriate mood and affect Skin without rash, ecchymosis, petechiae  Lab Results:  Results for orders placed or performed in visit on 02/21/16  CBC with Differential  Result Value Ref Range   WBC 6.2 3.9 - 10.3 10e3/uL   NEUT# 3.0 1.5 - 6.5 10e3/uL   HGB 13.8 11.6 - 15.9 g/dL   HCT 42.2 34.8 - 46.6 %   Platelets 272 145 - 400 10e3/uL   MCV 97.0 79.5 - 101.0 fL   MCH 31.8 25.1 - 34.0 pg   MCHC 32.8 31.5 - 36.0 g/dL   RBC 4.35 3.70 - 5.45 10e6/uL   RDW 14.3 11.2 - 14.5 %   lymph# 2.3 0.9 - 3.3 10e3/uL   MONO# 0.7 0.1 - 0.9 10e3/uL   Eosinophils Absolute 0.1 0.0 - 0.5 10e3/uL   Basophils Absolute 0.1 0.0 - 0.1 10e3/uL   NEUT% 49.4 38.4 - 76.8 %   LYMPH% 37.6 14.0 - 49.7 %   MONO% 10.9 0.0 - 14.0 %   EOS% 1.1 0.0 - 7.0 %   BASO% 1.0 0.0 - 2.0 %  Comprehensive metabolic panel  Result Value Ref Range   Sodium 139 136 - 145 mEq/L   Potassium 3.9 3.5 - 5.1 mEq/L   Chloride 102 98 - 109 mEq/L   CO2 28 22 - 29 mEq/L   Glucose 93 70 - 140 mg/dl   BUN 14.1 7.0 - 26.0 mg/dL   Creatinine 0.9 0.6 - 1.1 mg/dL   Total Bilirubin 0.58 0.20 - 1.20 mg/dL   Alkaline Phosphatase 77 40 - 150 U/L   AST 33 5 - 34 U/L   ALT 18 0 - 55 U/L   Total Protein 8.0 6.4 - 8.3 g/dL   Albumin 4.0 3.5 - 5.0 g/dL    Calcium 9.7 8.4 - 10.4 mg/dL   Anion Gap 9 3 - 11 mEq/L   EGFR 69 (L) >90 ml/min/1.73 m2   CA 125 available after visit 19.9, this having been 22.8 on 09-30-15  Studies/Results:  No results found.  Overdue mammograms, last 12-22-14 at [facility], ordered now  Medications: I have reviewed the patient's current medications. Confirmed with [place] pharmacy that she has NOT had this year's flu vaccine, incorrectly listed in EMR as administered 10-12-15; she will have flu vaccine now. She will have pneumovax 23 to complete post splenectomy vaccines, to be given in Jan when she is at [facility] for Dr Leone Brand visit.  DISCUSSION Patient understands clearly that she should be in contact with gyn oncology or med onc if any concerns or symptoms that could be related to progressive  disease.  We will let her know results of marker as above. She will see Dr Skeet Latch 04-24-16.   [facility] Tuesdays best for her work schedule.  Patient is aware that another medical oncologist will be working with gyn oncology after first of year. Dr Skeet Latch will make next appointments from her visit in Jan.   She understands that she should contact MD immediately for any significant infectious illness, as she may need antibiotics due to splenectomy.  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 Recurrence with 9 cm mass in splenic hilum at splenectomy with resection of involved pancreatic tail 02-02-15, CA 125 elevated at 88 preoperatively. Received 4 cycles dose dense carbo taxol thru 06-02-15. CT questioned area in right hepatic lobe otherwise good, PET 07-14-15 no evidence of metastatic disease including in area that appears to be cyst right hepatic lobe and CT AP 11-23-15 not remarkable.  She has not had PARP inhibitor as not clear that she met  criteria after treatment of the recurrent disease as above  Next appointment with Dr Skeet Latch Jan 2018 2.Post splenectomy: additional vaccines given according to most current recommendations, per my discussions with ID and [facility] pharmacist. Flu vaccine today, pneumovax 23 with Dr Leone Brand visit in Jan 2018. 3. Deconditioning with surgery and chemo resolved, now doing 4 miles on treadmill daily  4. mammograms due, ordered now 5. No colonoscopy: may be ok for referral after she sees Dr Skeet Latch next 6.slight preexisting peripheral neuropathy fingertips, not worse with this chemo, symptoms in feet stable. 7. 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 Time spent 25 min including >50% counseling and coordination of care. Cc Drs Skeet Latch and Ruthe Mannan, MD   02/21/2016, 6:24 PM

## 2016-02-22 ENCOUNTER — Telehealth: Payer: Self-pay

## 2016-02-22 LAB — CA 125: CANCER ANTIGEN (CA) 125: 19.9 U/mL (ref 0.0–38.1)

## 2016-02-22 NOTE — Telephone Encounter (Signed)
Told Ms Rookstool that her ca-125 was down a little to 19.9 from 22.8 4 months ago. Pt very pleased.

## 2016-02-24 ENCOUNTER — Ambulatory Visit: Payer: BLUE CROSS/BLUE SHIELD | Admitting: Gynecologic Oncology

## 2016-03-06 ENCOUNTER — Telehealth: Payer: Self-pay | Admitting: General Practice

## 2016-03-06 NOTE — Telephone Encounter (Signed)
Left msg regarding 04/24/2016 appts.

## 2016-03-07 DIAGNOSIS — H25012 Cortical age-related cataract, left eye: Secondary | ICD-10-CM | POA: Diagnosis not present

## 2016-03-07 DIAGNOSIS — D3132 Benign neoplasm of left choroid: Secondary | ICD-10-CM | POA: Diagnosis not present

## 2016-03-07 DIAGNOSIS — H26491 Other secondary cataract, right eye: Secondary | ICD-10-CM | POA: Diagnosis not present

## 2016-03-07 DIAGNOSIS — Z0100 Encounter for examination of eyes and vision without abnormal findings: Secondary | ICD-10-CM | POA: Diagnosis not present

## 2016-03-14 ENCOUNTER — Ambulatory Visit: Payer: BLUE CROSS/BLUE SHIELD

## 2016-03-16 ENCOUNTER — Telehealth: Payer: Self-pay | Admitting: Oncology

## 2016-03-16 NOTE — Telephone Encounter (Signed)
Appointments rescheduled per patient request. Patient satisfied  with date and time.

## 2016-03-21 ENCOUNTER — Ambulatory Visit (HOSPITAL_BASED_OUTPATIENT_CLINIC_OR_DEPARTMENT_OTHER): Payer: BLUE CROSS/BLUE SHIELD

## 2016-03-21 DIAGNOSIS — Z23 Encounter for immunization: Secondary | ICD-10-CM

## 2016-03-21 DIAGNOSIS — C561 Malignant neoplasm of right ovary: Secondary | ICD-10-CM

## 2016-03-21 MED ORDER — PNEUMOCOCCAL VAC POLYVALENT 25 MCG/0.5ML IJ INJ
0.5000 mL | INJECTION | Freq: Once | INTRAMUSCULAR | Status: AC
  Administered 2016-03-21: 0.5 mL via INTRAMUSCULAR
  Filled 2016-03-21: qty 0.5

## 2016-03-21 NOTE — Progress Notes (Signed)
Discharge instructions for Pneumococcal-23 Vaccine from CDC given to patient.  Patient verbalized understanding.

## 2016-04-21 ENCOUNTER — Other Ambulatory Visit: Payer: Self-pay | Admitting: Oncology

## 2016-04-21 DIAGNOSIS — C561 Malignant neoplasm of right ovary: Secondary | ICD-10-CM

## 2016-04-23 NOTE — Progress Notes (Signed)
Office Visit Note: Gyn-Onc  Shelley Sanchez 61 y.o. female  CC:  Ovarian cancer  surveillance  Assessment/Plan:  61 y.o. who underwent optimal debulking on 04/09/2012. 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 She completed six cycles dose dense.  TaxolCarboplatin therapy 08/2012. 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  Treated with splenectomy taxol/carboplatin completed in March 2017  Close follow-up with CA125.    CT CHEST ABD/PELVIS  For f/u of enlarged but nonavid retrocaval LN Follow-up in 3 months    HPI:  Oncology History   61 y.o. G3 P3 LNMP in early 46's. In October 2013 Shelley Sanchez noted fatigue, and fecal urge incontinence. Also noted increasing abdominal girth. No change in appetite, no nausea or vomiting. CT Chest/Abd/Pelvis 04/01/2012 notable for ascites so patient was admitted for evaluation. Paracentesis performed 04/01/2012. Results were negative for malignancy.  CA -125 at the time of presentation was 2418.        Ovarian cancer on right [facility])   04/08/2012 Initial Diagnosis    Ovarian cancer on right      04/09/2012 Surgery    exploratory laparotomy total abdominal hysterectomy bilateral salpingo-oophorectomy omentectomy radical debulking to optimal disease on 04/09/2012.  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 The only gross disease present was a 7 mm lesion in the posterior most aspect of the liver.   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 The genes tested were ATM, BARD1, BRCA1, BRCA2, BRIP1, CDH1, CHEK2, EPCAM, MLH1, MRE11A, MSH2, MSH6, MUTYH, NBN, NF1, PALB2, PMS2, PTEN, RAD50, RAD51C, RAD51D, SMARCA4, STK11, and TP5       11/2014 Relapse/Recurrence    Rising CA 125 noted in 2016. CT CAP 01-11-15 found mass at anterior aspect of spleen 11.5 x 8.2 x 13 cm, with stable unremarkable chest, no ascites, no adenopathy, no pelvic masses. She had splenectomy by Dr Barry Dienes 02-02-15, pathology [id number]) high grade serous carcinoma involving splenic capsule and parenchyma, 9 cm. There was no visualized abdominal carcinomatosis; tail of pancreas was adherent to mass and taken with spleen      12/2014 - 05/2015 Chemotherapy     5  Cycles dense carbo taxol on 05/2015.       10/2015 Imaging    PET 10/2015  IMPRESSION: 1. No definite recurrent metastatic disease in the abdomen or pelvis. 2. Solitary mildly enlarged right paracaval lymph node is stable in size since 07/14/2015 PET-CT, where it appeared non hypermetabolic, suggesting a benign node. Recommend continued attention to this node on follow-up CT or PET-CT.         Interval Hx:  Last Ca 125 02/21/2016 19.9 no SOB no cough, no changes in bowel or bladder habits, no changes in weight    ROS:  No change in bowel or bladder habits, no nausea or emesis, no vaginal bleeding, no SOB no chest pain otherwise 10 point ROS negative  Social Hx:  Resides with her significant other, works full time, overall happy  Past Surgical Hx:  Past Surgical History:  Procedure Laterality Date  . ABDOMINAL HYSTERECTOMY  04/09/2012   Procedure: HYSTERECTOMY ABDOMINAL;  Surgeon: [name], MD PHD;  Location: [facility];  Service:  Gynecology;  Laterality: N/A;  . BREAST ENHANCEMENT SURGERY  2000  . LAPAROTOMY  04/09/2012   Procedure: EXPLORATORY LAPAROTOMY;  Surgeon: [name], MD PHD;  Location: [facility];  Service: Gynecology;  Laterality: N/A;  EXPLORATORY LAPAROTOMY, TOTAL ABDOMINAL HYESTERCTOMY, BILATERAL SALPINGO OOPHERECTOMY  . mohs procedure to right side of base of nose       Past Medical Hx:  Past Medical History:  Diagnosis Date  . Ascites 03/2012   on CT a/p; s/p  paracentesis  . Family history of ovarian cancer   . Headache   . Hyperthyroidism   . Ovarian cancer ([place]) 03/2012 dx   s/p debulk = stage III C poor diff, ov serous ca  . 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 Squamous cell carcinoma of right hand 2006   s/p excision  Genetic testing was normal and did not reveal a mutation in these genes. The genes tested were ATM, BARD1, BRCA1, BRCA2, BRIP1, CDH1, CHEK2, EPCAM, MLH1, MRE11A, MSH2, MSH6, MUTYH, NBN, NF1, PALB2, PMS2, PTEN, RAD50, RAD51C, RAD51D, SMARCA4, STK11, and TP53.   Family Hx:  Family History  Problem Relation Age of Onset  . Melanoma Mother 18    on her back; TAH/BSO in ~30 d/t bleeding  . Rheum arthritis Mother   . Macular degeneration Mother   . Skin cancer Brother 56    squamous cell  . Thyroid cancer Brother 27    s/p thyroidectomy  . Ovarian cancer Maternal Aunt 54    "abd ca" possible ovarian; deceased 10s   PHYSICAL EXAMINATION Vitals BP (!) 103/53 (BP Location: Left Arm, Patient Position: Sitting) Comment: Made Nurse aware low BP  Pulse 65   Temp 97.6 F (36.4 C) (Oral)   Resp 17   Ht 5' 8" (1.727 m)   Wt 170 lb 9.6 oz (77.4 kg)   SpO2 98%   BMI 25.94 kg/m   Wt Readings from Last 3 Encounters:  04/24/16 170 lb 9.6 oz (77.4 kg)  02/21/16 170 lb (77.1 kg)  11/25/15 165 lb 9.6 oz (75.1 kg)   Neck  Supple without any enlargements.  Cardiovascular  Pulse normal rate, regularity and rhythm.  Lungs  Clear to auscultation bilaterally  Psychiatry  Alert and oriented appropriate mood and affect.  Abdomen  Normoactive bowel sounds, abdomen soft, non-tender and obese. Surgical  sites intact without evidence of hernia or tenderness Genito Urinary  Nl EGBUS, no pelvic or cul de sac masses,  RECTAL: Good tone, no masses no cul de sac nodularity.  Extremities  No bilateral cyanosis, clubbing or edema.

## 2016-04-24 ENCOUNTER — Ambulatory Visit: Payer: BLUE CROSS/BLUE SHIELD | Attending: Gynecologic Oncology | Admitting: Gynecologic Oncology

## 2016-04-24 ENCOUNTER — Encounter: Payer: Self-pay | Admitting: Gynecologic Oncology

## 2016-04-24 ENCOUNTER — Other Ambulatory Visit (HOSPITAL_BASED_OUTPATIENT_CLINIC_OR_DEPARTMENT_OTHER): Payer: BLUE CROSS/BLUE SHIELD

## 2016-04-24 ENCOUNTER — Ambulatory Visit: Payer: BLUE CROSS/BLUE SHIELD

## 2016-04-24 VITALS — BP 103/53 | HR 65 | Temp 97.6°F | Resp 17 | Ht 68.0 in | Wt 170.6 lb

## 2016-04-24 DIAGNOSIS — C569 Malignant neoplasm of unspecified ovary: Secondary | ICD-10-CM | POA: Diagnosis not present

## 2016-04-24 DIAGNOSIS — C561 Malignant neoplasm of right ovary: Secondary | ICD-10-CM | POA: Diagnosis not present

## 2016-04-24 DIAGNOSIS — Z90710 Acquired absence of both cervix and uterus: Secondary | ICD-10-CM | POA: Diagnosis not present

## 2016-04-24 DIAGNOSIS — Z90722 Acquired absence of ovaries, bilateral: Secondary | ICD-10-CM | POA: Insufficient documentation

## 2016-04-24 DIAGNOSIS — R599 Enlarged lymph nodes, unspecified: Secondary | ICD-10-CM

## 2016-04-24 DIAGNOSIS — Z9081 Acquired absence of spleen: Secondary | ICD-10-CM | POA: Diagnosis not present

## 2016-04-24 DIAGNOSIS — Z9221 Personal history of antineoplastic chemotherapy: Secondary | ICD-10-CM | POA: Insufficient documentation

## 2016-04-24 DIAGNOSIS — Z808 Family history of malignant neoplasm of other organs or systems: Secondary | ICD-10-CM | POA: Diagnosis not present

## 2016-04-24 DIAGNOSIS — R590 Localized enlarged lymph nodes: Secondary | ICD-10-CM | POA: Diagnosis not present

## 2016-04-25 ENCOUNTER — Other Ambulatory Visit: Payer: Self-pay | Admitting: Oncology

## 2016-04-25 LAB — CA 125: Cancer Antigen (CA) 125: 19.2 U/mL (ref 0.0–38.1)

## 2016-04-26 ENCOUNTER — Telehealth: Payer: Self-pay

## 2016-04-26 ENCOUNTER — Other Ambulatory Visit: Payer: Self-pay | Admitting: Gynecologic Oncology

## 2016-04-26 DIAGNOSIS — C561 Malignant neoplasm of right ovary: Secondary | ICD-10-CM

## 2016-04-26 NOTE — Telephone Encounter (Signed)
-----   Message from Gordy Levan, MD sent at 04/25/2016  9:46 PM EST ----- Labs seen and need follow up: please let her know CA 125 stable at 19.2.

## 2016-04-26 NOTE — Telephone Encounter (Signed)
Told Shelley Sanchez the results of the CA-125 from 04-24-16  as noted below by Dr. Marko Plume.

## 2016-05-03 ENCOUNTER — Other Ambulatory Visit: Payer: Self-pay | Admitting: Hematology and Oncology

## 2016-05-03 DIAGNOSIS — Z1239 Encounter for other screening for malignant neoplasm of breast: Secondary | ICD-10-CM

## 2016-05-23 ENCOUNTER — Ambulatory Visit
Admission: RE | Admit: 2016-05-23 | Discharge: 2016-05-23 | Disposition: A | Discharge status: Home / Self Care | Payer: BLUE CROSS/BLUE SHIELD | Source: Ambulatory Visit | Attending: Hematology and Oncology | Admitting: Hematology and Oncology

## 2016-05-23 DIAGNOSIS — Z1239 Encounter for other screening for malignant neoplasm of breast: Secondary | ICD-10-CM

## 2016-05-23 DIAGNOSIS — Z1231 Encounter for screening mammogram for malignant neoplasm of breast: Secondary | ICD-10-CM | POA: Diagnosis not present

## 2016-06-20 ENCOUNTER — Ambulatory Visit (INDEPENDENT_AMBULATORY_CARE_PROVIDER_SITE_OTHER): Payer: BLUE CROSS/BLUE SHIELD | Admitting: Internal Medicine

## 2016-06-20 ENCOUNTER — Encounter: Payer: Self-pay | Admitting: Internal Medicine

## 2016-06-20 VITALS — BP 130/80 | HR 80 | Temp 98.2°F | Resp 14 | Ht 68.0 in | Wt 173.0 lb

## 2016-06-20 DIAGNOSIS — J111 Influenza due to unidentified influenza virus with other respiratory manifestations: Secondary | ICD-10-CM

## 2016-06-20 DIAGNOSIS — R69 Illness, unspecified: Secondary | ICD-10-CM

## 2016-06-20 MED ORDER — HYDROCODONE-HOMATROPINE 5-1.5 MG/5ML PO SYRP: 5.0000 mL | ORAL_SOLUTION | Freq: Four times a day (QID) | ORAL | 0 refills | Status: DC | PRN

## 2016-06-20 MED ORDER — OSELTAMIVIR PHOSPHATE 75 MG PO CAPS: 75.0000 mg | ORAL_CAPSULE | Freq: Two times a day (BID) | ORAL | 0 refills | Status: DC

## 2016-06-20 NOTE — Patient Instructions (Signed)
Please take all new medication as prescribed - the tamiflu, and cough medicine if needed  Please continue all other medications as before, and refills have been done if requested.  Please have the pharmacy call with any other refills you may need.  Please keep your appointments with your specialists as you may have planned

## 2016-06-20 NOTE — Progress Notes (Signed)
Pre visit review using our clinic review tool, if applicable. No additional management support is needed unless otherwise documented below in the visit note. 

## 2016-06-20 NOTE — Assessment & Plan Note (Signed)
Mild to mod, for antibx course - tamiflu, cough med prn,  to f/u any worsening symptoms or concerns

## 2016-06-20 NOTE — Progress Notes (Signed)
Subjective:    Patient ID: Shelley Sanchez, female    DOB: Sep 19, 1955, 61 y.o.   MRN: [id number]  HPI  Here with 3 days onset HA, feverish, ST, non prod cough, sinus congestion without sob, wheezing or CP.  Has known exposure to boyfriend with recent influenza.  Pt denies chest pain, increased sob or doe, wheezing, orthopnea, PND, increased LE swelling, palpitations, dizziness or syncope.   Pt denies polydipsia, polyuria Past Medical History:  Diagnosis Date  . Ascites 03/2012   on CT a/p; s/p paracentesis  . Family history of ovarian cancer   . Headache   . Hyperthyroidism   . Ovarian cancer ([place]) 03/2012 dx   s/p debulk = stage III C poor diff, ov serous ca  . 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 Squamous cell carcinoma of right hand 2006   s/p excision   Past Surgical History:  Procedure Laterality Date  . ABDOMINAL HYSTERECTOMY  04/09/2012   Procedure: HYSTERECTOMY ABDOMINAL;  Surgeon: [name], MD PHD;  Location: [facility];  Service: Gynecology;  Laterality: N/A;  . AUGMENTATION MAMMAPLASTY Bilateral 2001  . BREAST ENHANCEMENT SURGERY  2000  . LAPAROTOMY  04/09/2012   Procedure: EXPLORATORY LAPAROTOMY;  Surgeon: [name], MD PHD;  Location: [facility];  Service: Gynecology;  Laterality: N/A;  EXPLORATORY LAPAROTOMY, TOTAL ABDOMINAL HYESTERCTOMY, BILATERAL SALPINGO OOPHERECTOMY  . mohs procedure to right side of base of nose       reports that she has never smoked. She has never used smokeless tobacco. She reports that she does not drink alcohol or use drugs. family history includes Macular degeneration in her mother; Melanoma (age of onset: 64) in her mother; Ovarian cancer (age of onset: 68) in her maternal aunt; Rheum arthritis in her mother; Skin cancer (age of onset: 62) in her brother; Thyroid cancer (age of onset: 58) in her brother. No Known Allergies Current Outpatient Prescriptions on File Prior to Visit  Medication Sig Dispense Refill  .  ferrous fumarate (HEMOCYTE - 106 MG FE) 325 (106 FE) MG TABS tablet Take 1 tablet  Daily on an empty stomach with OJ. 30 each 2  . ibuprofen (ADVIL,MOTRIN) 200 MG tablet Take 400 mg by mouth every 6 (six) hours as needed for headache. Reported on 07/01/2015    . loratadine (CLARITIN) 10 MG tablet Take 10 mg by mouth daily. Reported on 09/30/2015    . meningococcal polysaccharide (MENACTRA) injection Inject 0.5 mLs into the muscle once. 0.5 mL 0  . polyethylene glycol (MIRALAX / GLYCOLAX) packet Take 17 g by mouth daily as needed. Reported on 09/30/2015     No current facility-administered medications on file prior to visit.    Review of Systems All otherwise neg per pt     Objective:   Physical Exam BP 130/80 (BP Location: Left Arm, Patient Position: Sitting, Cuff Size: Normal)   Pulse 80   Temp 98.2 F (36.8 C) (Oral)   Resp 14   Ht 5\' 8"  (1.727 m)   Wt 173 lb (78.5 kg)   SpO2 98%   BMI 26.30 kg/m  VS noted, mild ill Constitutional: Pt appears in no apparent distress HENT: Head: NCAT.  Right Ear: External ear normal.  Left Ear: External ear normal.  Eyes: . Pupils are equal, round, and reactive to light. Conjunctivae and EOM are normal Bilat tm's with mild erythema.  Max sinus areas no tender.  Pharynx with mild erythema, no exudate Neck: Normal range of  motion. Neck supple.  Cardiovascular: Normal rate and regular rhythm.   Pulmonary/Chest: Effort normal and breath sounds without rales or wheezing.  Neurological: Pt is alert. Not confused , motor grossly intact Skin: Skin is warm. No rash, no LE edema Psychiatric: Pt behavior is normal. No agitation.  No other exam findings    Assessment & Plan:

## 2016-06-26 NOTE — Progress Notes (Signed)
Subjective:    Patient ID: Shelley Sanchez, female    DOB: 18-Sep-1955, 61 y.o.   MRN: [id number]  HPI She is here for a physical exam.   She has a treadmill at home and should use it more, currently uses it 1-2 times a week.  She would like to lose weight.    Headaches: She gets headaches about once a week to every other week.  She will get nauseous. She gets photosensitivity and phonosensitivity.  She takes advil and it often helps.   Medications and allergies reviewed with patient and updated if appropriate.  Patient Active Problem List   Diagnosis Date Noted  . Chemotherapy-induced peripheral neuropathy ([place]) 10/02/2015  . Chemotherapy-induced nausea 06/01/2015  . Chemotherapy induced nausea and vomiting 05/12/2015  . 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 International Federation of Gynecology and Obstetrics (FIGO) stage IVB epithelial ovarian cancer ([place]) 03/11/2015  . Multiple pulmonary nodules 01/06/2014  . Squamous cell carcinoma of right hand   . Ovarian cancer on right ([place]) 05/21/2012  . Hypothyroid 03/07/2012    Current Outpatient Prescriptions on File Prior to Visit  Medication Sig Dispense Refill  . ferrous fumarate (HEMOCYTE - 106 MG FE) 325 (106 FE) MG TABS tablet Take 1 tablet  Daily on an empty stomach with OJ. 30 each 2  . ibuprofen (ADVIL,MOTRIN) 200 MG tablet Take 400 mg by mouth every 6 (six) hours as needed for headache. Reported on 07/01/2015    . loratadine (CLARITIN) 10 MG tablet Take 10 mg by mouth daily. Reported on 09/30/2015    . polyethylene glycol (MIRALAX / GLYCOLAX) packet Take 17 g by mouth daily as needed. Reported on 09/30/2015     No current facility-administered medications on file prior to visit.     Past Medical History:  Diagnosis Date  . Ascites 03/2012   on CT a/p; s/p paracentesis  . Family history of ovarian cancer   . Headache   . Hyperthyroidism   . Ovarian cancer ([place]) 03/2012 dx   s/p debulk = stage III C poor diff, ov serous ca    . 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 Squamous cell carcinoma of right hand 2006   s/p excision    Past Surgical History:  Procedure Laterality Date  . ABDOMINAL HYSTERECTOMY  04/09/2012   Procedure: HYSTERECTOMY ABDOMINAL;  Surgeon: [name], MD PHD;  Location: [facility];  Service: Gynecology;  Laterality: N/A;  . AUGMENTATION MAMMAPLASTY Bilateral 2001  . BREAST ENHANCEMENT SURGERY  2000  . LAPAROTOMY  04/09/2012   Procedure: EXPLORATORY LAPAROTOMY;  Surgeon: [name], MD PHD;  Location: [facility];  Service: Gynecology;  Laterality: N/A;  EXPLORATORY LAPAROTOMY, TOTAL ABDOMINAL HYESTERCTOMY, BILATERAL SALPINGO OOPHERECTOMY  . mohs procedure to right side of base of nose       Social History   Social History  . Marital status: Widowed    Spouse name: N/A  . Number of children: 3  . Years of education: N/A   Social History Main Topics  . Smoking status: Never Smoker  . Smokeless tobacco: Never Used  . Alcohol use No  . Drug use: No  . Sexual activity: Yes   Other Topics Concern  . 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 Melanoma Mother 23    on her back; TAH/BSO in ~30 d/t bleeding  . Rheum arthritis Mother   . Macular degeneration Mother   . Skin cancer Brother 56    squamous cell  . Thyroid cancer Brother 23    s/p thyroidectomy  . Ovarian cancer Maternal Aunt 50    "abd ca" possible ovarian; deceased 38s    Review of Systems  Constitutional: Negative for appetite change, chills and fever.  HENT: Negative for hearing loss.   Eyes: Negative for visual disturbance.  Respiratory: Positive for cough (residual from flu). Negative for shortness of breath and wheezing.   Cardiovascular: Negative for chest pain and palpitations.  Gastrointestinal:  Positive for constipation (occasional). Negative for abdominal pain, blood in stool, diarrhea and nausea.       No gerd  Genitourinary: Negative for dysuria and hematuria.  Musculoskeletal: Positive for arthralgias (occ knee pain).  Skin: Negative for color change and rash.  Neurological: Positive for headaches. Negative for light-headedness.  Psychiatric/Behavioral: Negative for dysphoric mood and sleep disturbance. The patient is not nervous/anxious.        Objective:   Vitals:   06/27/16 1346  BP: 114/80  Pulse: (!) 59  Resp: 16  Temp: 98 F (36.7 C)   Filed Weights   06/27/16 1346  Weight: 170 lb (77.1 kg)   Body mass index is 25.85 kg/m.  Wt Readings from Last 3 Encounters:  06/27/16 170 lb (77.1 kg)  06/20/16 173 lb (78.5 kg)  04/24/16 170 lb 9.6 oz (77.4 kg)     Physical Exam Constitutional: She appears well-developed and well-nourished. No distress.  HENT:  Head: Normocephalic and atraumatic.  Right Ear: External ear normal. Normal ear canal and TM Left Ear: External ear normal.  Normal ear canal and TM Mouth/Throat: Oropharynx is clear and moist.  Eyes: Conjunctivae and EOM are normal.  Neck: Neck supple. No tracheal deviation present. No thyromegaly present.  No carotid bruit  Cardiovascular: Normal rate, regular rhythm and normal heart sounds.   No murmur heard.  No edema. Pulmonary/Chest: Effort normal and breath sounds normal. No respiratory distress. She has no wheezes. She has no rales.  Breast: deferred to Gyn Abdominal: Soft. She exhibits no distension. There is no tenderness.  Lymphadenopathy: She has no cervical adenopathy.  Skin: Skin is warm and dry. She is not diaphoretic.  Psychiatric: She has a normal mood and affect. Her behavior is normal.         Assessment & Plan:   Physical exam: Screening blood work  ordered Immunizations  She thinks she had a td in the past ten years, flu up to date Colonoscopy - never had one - discussed  referral - she will let me know when she is ready Mammogram   Up to date  Gyn  Up to date  Eye exams  Up to date  Exercise - advised increasing regular exercise Weight  - will work on weight loss Skin   No concerns - will schedule derm follow up Substance abuse  none  See Problem List for Assessment and Plan of chronic medical problems.  FU annually

## 2016-06-27 ENCOUNTER — Ambulatory Visit (INDEPENDENT_AMBULATORY_CARE_PROVIDER_SITE_OTHER): Payer: BLUE CROSS/BLUE SHIELD | Admitting: Internal Medicine

## 2016-06-27 ENCOUNTER — Encounter: Payer: Self-pay | Admitting: Internal Medicine

## 2016-06-27 VITALS — BP 114/80 | HR 59 | Temp 98.0°F | Resp 16 | Ht 68.0 in | Wt 170.0 lb

## 2016-06-27 DIAGNOSIS — Z0000 Encounter for general adult medical examination without abnormal findings: Secondary | ICD-10-CM | POA: Diagnosis not present

## 2016-06-27 DIAGNOSIS — E038 Other specified hypothyroidism: Secondary | ICD-10-CM | POA: Diagnosis not present

## 2016-06-27 DIAGNOSIS — E063 Autoimmune thyroiditis: Secondary | ICD-10-CM

## 2016-06-27 DIAGNOSIS — C569 Malignant neoplasm of unspecified ovary: Secondary | ICD-10-CM | POA: Diagnosis not present

## 2016-06-27 DIAGNOSIS — C44622 Squamous cell carcinoma of skin of right upper limb, including shoulder: Secondary | ICD-10-CM | POA: Diagnosis not present

## 2016-06-27 NOTE — Patient Instructions (Addendum)
Test(s) ordered today. Your results will be released to [place] (or called to you) after review, usually within 72hours after test completion. If any changes need to be made, you will be notified at that same time.  All other Health Maintenance issues reviewed.   All recommended immunizations and age-appropriate screenings are up-to-date or discussed.  No immunizations administered today.   Medications reviewed and updated.  No changes recommended at this time.   Please followup in one year for a physical   Health Maintenance, Female Adopting a healthy lifestyle and getting preventive care can go a long way to promote health and wellness. Talk with your health care provider about what schedule of regular examinations is right for you. This is a good chance for you to check in with your provider about disease prevention and staying healthy. In between checkups, there are plenty of things you can do on your own. Experts have done a lot of research about which lifestyle changes and preventive measures are most likely to keep you healthy. Ask your health care provider for more information. Weight and diet Eat a healthy diet  Be sure to include plenty of vegetables, fruits, low-fat dairy products, and lean protein.  Do not eat a lot of foods high in solid fats, added sugars, or salt.  Get regular exercise. This is one of the most important things you can do for your health.  Most adults should exercise for at least 150 minutes each week. The exercise should increase your heart rate and make you sweat (moderate-intensity exercise).  Most adults should also do strengthening exercises at least twice a week. This is in addition to the moderate-intensity exercise. Maintain a healthy weight  Body mass index (BMI) is a measurement that can be used to identify possible weight problems. It estimates body fat based on height and weight. Your health care provider can help determine your BMI and help  you achieve or maintain a healthy weight.  For females 6 years of age and older:  A BMI below 18.5 is considered underweight.  A BMI of 18.5 to 24.9 is normal.  A BMI of 25 to 29.9 is considered overweight.  A BMI of 30 and above is considered obese. Watch levels of cholesterol and blood lipids  You should start having your blood tested for lipids and cholesterol at 62 years of age, then have this test every 5 years.  You may need to have your cholesterol levels checked more often if:  Your lipid or cholesterol levels are high.  You are older than 62 years of age.  You are at high risk for heart disease. Cancer screening Lung Cancer  Lung cancer screening is recommended for adults 98-41 years old who are at high risk for lung cancer because of a history of smoking.  A yearly low-dose CT scan of the lungs is recommended for people who:  Currently smoke.  Have quit within the past 15 years.  Have at least a 30-pack-year history of smoking. A pack year is smoking an average of one pack of cigarettes a day for 1 year.  Yearly screening should continue until it has been 15 years since you quit.  Yearly screening should stop if you develop a health problem that would prevent you from having lung cancer treatment. Breast Cancer  Practice breast self-awareness. This means understanding how your breasts normally appear and feel.  It also means doing regular breast self-exams. Let your health care provider know about any changes, no  matter how small.  If you are in your 20s or 30s, you should have a clinical breast exam (CBE) by a health care provider every 1-3 years as part of a regular health exam.  If you are 45 or older, have a CBE every year. Also consider having a breast X-ray (mammogram) every year.  If you have a family history of breast cancer, talk to your health care provider about genetic screening.  If you are at high risk for breast cancer, talk to your health  care provider about having an MRI and a mammogram every year.  Breast cancer gene (BRCA) assessment is recommended for women who have family members with BRCA-related cancers. BRCA-related cancers include:  Breast.  Ovarian.  Tubal.  Peritoneal cancers.  Results of the assessment will determine the need for genetic counseling and BRCA1 and BRCA2 testing. Cervical Cancer  Your health care provider may recommend that you be screened regularly for cancer of the pelvic organs (ovaries, uterus, and vagina). This screening involves a pelvic examination, including checking for microscopic changes to the surface of your cervix (Pap test). You may be encouraged to have this screening done every 3 years, beginning at age 74.  For women ages 32-65, health care providers may recommend pelvic exams and Pap testing every 3 years, or they may recommend the Pap and pelvic exam, combined with testing for human papilloma virus (HPV), every 5 years. Some types of HPV increase your risk of cervical cancer. Testing for HPV may also be done on women of any age with unclear Pap test results.  Other health care providers may not recommend any screening for nonpregnant women who are considered low risk for pelvic cancer and who do not have symptoms. Ask your health care provider if a screening pelvic exam is right for you.  If you have had past treatment for cervical cancer or a condition that could lead to cancer, you need Pap tests and screening for cancer for at least 20 years after your treatment. If Pap tests have been discontinued, your risk factors (such as having a new sexual partner) need to be reassessed to determine if screening should resume. Some women have medical problems that increase the chance of getting cervical cancer. In these cases, your health care provider may recommend more frequent screening and Pap tests. Colorectal Cancer  This type of cancer can be detected and often prevented.  Routine  colorectal cancer screening usually begins at 62 years of age and continues through 62 years of age.  Your health care provider may recommend screening at an earlier age if you have risk factors for colon cancer.  Your health care provider may also recommend using home test kits to check for hidden blood in the stool.  A small camera at the end of a tube can be used to examine your colon directly (sigmoidoscopy or colonoscopy). This is done to check for the earliest forms of colorectal cancer.  Routine screening usually begins at age 36.  Direct examination of the colon should be repeated every 5-10 years through 62 years of age. However, you may need to be screened more often if early forms of precancerous polyps or small growths are found. Skin Cancer  Check your skin from head to toe regularly.  Tell your health care provider about any new moles or changes in moles, especially if there is a change in a mole's shape or color.  Also tell your health care provider if you have a mole  that is larger than the size of a pencil eraser.  Always use sunscreen. Apply sunscreen liberally and repeatedly throughout the day.  Protect yourself by wearing long sleeves, pants, a wide-brimmed hat, and sunglasses whenever you are outside. Heart disease, diabetes, and high blood pressure  High blood pressure causes heart disease and increases the risk of stroke. High blood pressure is more likely to develop in:  People who have blood pressure in the high end of the normal range (130-139/85-89 mm Hg).  People who are overweight or obese.  People who are African American.  If you are 66-36 years of age, have your blood pressure checked every 3-5 years. If you are 29 years of age or older, have your blood pressure checked every year. You should have your blood pressure measured twice-once when you are at a hospital or clinic, and once when you are not at a hospital or clinic. Record the average of the two  measurements. To check your blood pressure when you are not at a hospital or clinic, you can use:  An automated blood pressure machine at a pharmacy.  A home blood pressure monitor.  If you are between 65 years and 45 years old, ask your health care provider if you should take aspirin to prevent strokes.  Have regular diabetes screenings. This involves taking a blood sample to check your fasting blood sugar level.  If you are at a normal weight and have a low risk for diabetes, have this test once every three years after 62 years of age.  If you are overweight and have a high risk for diabetes, consider being tested at a younger age or more often. Preventing infection Hepatitis B  If you have a higher risk for hepatitis B, you should be screened for this virus. You are considered at high risk for hepatitis B if:  You were born in a country where hepatitis B is common. Ask your health care provider which countries are considered high risk.  Your parents were born in a high-risk country, and you have not been immunized against hepatitis B (hepatitis B vaccine).  You have HIV or AIDS.  You use needles to inject street drugs.  You live with someone who has hepatitis B.  You have had sex with someone who has hepatitis B.  You get hemodialysis treatment.  You take certain medicines for conditions, including cancer, organ transplantation, and autoimmune conditions. Hepatitis C  Blood testing is recommended for:  Everyone born from [birth year] through 1966.  Anyone with known risk factors for hepatitis C. Sexually transmitted infections (STIs)  You should be screened for sexually transmitted infections (STIs) including gonorrhea and chlamydia if:  You are sexually active and are younger than 62 years of age.  You are older than 62 years of age and your health care provider tells you that you are at risk for this type of infection.  Your sexual activity has changed since you were last  screened and you are at an increased risk for chlamydia or gonorrhea. Ask your health care provider if you are at risk.  If you do not have HIV, but are at risk, it may be recommended that you take a prescription medicine daily to prevent HIV infection. This is called pre-exposure prophylaxis (PrEP). You are considered at risk if:  You are sexually active and do not regularly use condoms or know the HIV status of your partner(s).  You take drugs by injection.  You are sexually active with  a partner who has HIV. Talk with your health care provider about whether you are at high risk of being infected with HIV. If you choose to begin PrEP, you should first be tested for HIV. You should then be tested every 3 months for as long as you are taking PrEP. Pregnancy  If you are premenopausal and you may become pregnant, ask your health care provider about preconception counseling.  If you may become pregnant, take 400 to 800 micrograms (mcg) of folic acid every day.  If you want to prevent pregnancy, talk to your health care provider about birth control (contraception). Osteoporosis and menopause  Osteoporosis is a disease in which the bones lose minerals and strength with aging. This can result in serious bone fractures. Your risk for osteoporosis can be identified using a bone density scan.  If you are 48 years of age or older, or if you are at risk for osteoporosis and fractures, ask your health care provider if you should be screened.  Ask your health care provider whether you should take a calcium or vitamin D supplement to lower your risk for osteoporosis.  Menopause may have certain physical symptoms and risks.  Hormone replacement therapy may reduce some of these symptoms and risks. Talk to your health care provider about whether hormone replacement therapy is right for you. Follow these instructions at home:  Schedule regular health, dental, and eye exams.  Stay current with your  immunizations.  Do not use any tobacco products including cigarettes, chewing tobacco, or electronic cigarettes.  If you are pregnant, do not drink alcohol.  If you are breastfeeding, limit how much and how often you drink alcohol.  Limit alcohol intake to no more than 1 drink per day for nonpregnant women. One drink equals 12 ounces of beer, 5 ounces of wine, or 1 ounces of hard liquor.  Do not use street drugs.  Do not share needles.  Ask your health care provider for help if you need support or information about quitting drugs.  Tell your health care provider if you often feel depressed.  Tell your health care provider if you have ever been abused or do not feel safe at home. This information is not intended to replace advice given to you by your health care provider. Make sure you discuss any questions you have with your health care provider. Document Released: 09/26/2010 Document Revised: 08/19/2015 Document Reviewed: 12/15/2014 Elsevier Interactive Patient Education  2017 [employer].

## 2016-06-27 NOTE — Assessment & Plan Note (Signed)
Advised follow up with dermatology

## 2016-06-27 NOTE — Progress Notes (Signed)
Pre visit review using our clinic review tool, if applicable. No additional management support is needed unless otherwise documented below in the visit note. 

## 2016-06-27 NOTE — Assessment & Plan Note (Signed)
Check tsh - not currently on medication Antibodies positive

## 2016-07-11 ENCOUNTER — Other Ambulatory Visit (INDEPENDENT_AMBULATORY_CARE_PROVIDER_SITE_OTHER): Payer: BLUE CROSS/BLUE SHIELD

## 2016-07-11 DIAGNOSIS — Z0000 Encounter for general adult medical examination without abnormal findings: Secondary | ICD-10-CM

## 2016-07-11 LAB — COMPREHENSIVE METABOLIC PANEL
ALT: 13 U/L (ref 0–35)
AST: 21 U/L (ref 0–37)
Albumin: 4.4 g/dL (ref 3.5–5.2)
Alkaline Phosphatase: 66 U/L (ref 39–117)
BILIRUBIN TOTAL: 0.5 mg/dL (ref 0.2–1.2)
BUN: 14 mg/dL (ref 6–23)
CO2: 30 meq/L (ref 19–32)
CREATININE: 0.81 mg/dL (ref 0.40–1.20)
Calcium: 9.6 mg/dL (ref 8.4–10.5)
Chloride: 103 mEq/L (ref 96–112)
GFR: 76.27 mL/min (ref >60.00)
GLUCOSE: 97 mg/dL (ref 70–99)
Potassium: 4.3 mEq/L (ref 3.5–5.1)
Sodium: 139 mEq/L (ref 135–145)
Total Protein: 7.7 g/dL (ref 6.0–8.3)

## 2016-07-11 LAB — CBC WITH DIFFERENTIAL/PLATELET
BASOS ABS: 0 10*3/uL (ref 0.0–0.1)
Basophils Relative: 0.8 % (ref 0.0–3.0)
EOS ABS: 0.1 10*3/uL (ref 0.0–0.7)
Eosinophils Relative: 2.2 % (ref 0.0–5.0)
HCT: 42.1 % (ref 36.0–46.0)
Hemoglobin: 14.1 g/dL (ref 12.0–15.0)
Lymphocytes Relative: 42.5 % (ref 12.0–46.0)
Lymphs Abs: 2.5 10*3/uL (ref 0.7–4.0)
MCHC: 33.4 g/dL (ref 30.0–36.0)
MCV: 96.2 fl (ref 78.0–100.0)
MONOS PCT: 13.6 % — AB (ref 3.0–12.0)
Monocytes Absolute: 0.8 10*3/uL (ref 0.1–1.0)
NEUTROS ABS: 2.4 10*3/uL (ref 1.4–7.7)
NEUTROS PCT: 40.9 % — AB (ref 43.0–77.0)
PLATELETS: 259 10*3/uL (ref 150.0–400.0)
RBC: 4.38 Mil/uL (ref 3.87–5.11)
RDW: 13.4 % (ref 11.5–15.5)
WBC: 6 10*3/uL (ref 4.0–10.5)

## 2016-07-11 LAB — LIPID PANEL
Cholesterol: 191 mg/dL (ref 0–200)
HDL: 58 mg/dL (ref >39.00)
LDL Cholesterol: 119 mg/dL — ABNORMAL HIGH (ref 0–99)
NONHDL: 133.4
Total CHOL/HDL Ratio: 3
Triglycerides: 71 mg/dL (ref 0.0–149.0)
VLDL: 14.2 mg/dL (ref 0.0–40.0)

## 2016-07-11 LAB — VITAMIN D 25 HYDROXY (VIT D DEFICIENCY, FRACTURES): VITD: 18.61 ng/mL — ABNORMAL LOW (ref 30.00–100.00)

## 2016-07-11 LAB — TSH: TSH: 6.43 u[IU]/mL — ABNORMAL HIGH (ref 0.35–4.50)

## 2016-07-13 ENCOUNTER — Encounter: Payer: Self-pay | Admitting: Internal Medicine

## 2016-08-01 ENCOUNTER — Other Ambulatory Visit: Payer: BLUE CROSS/BLUE SHIELD

## 2016-08-03 ENCOUNTER — Ambulatory Visit: Payer: BLUE CROSS/BLUE SHIELD | Admitting: Gynecologic Oncology

## 2016-08-07 ENCOUNTER — Telehealth: Payer: Self-pay | Admitting: Internal Medicine

## 2016-08-07 ENCOUNTER — Other Ambulatory Visit: Payer: BLUE CROSS/BLUE SHIELD

## 2016-08-07 DIAGNOSIS — E063 Autoimmune thyroiditis: Principal | ICD-10-CM

## 2016-08-07 DIAGNOSIS — E038 Other specified hypothyroidism: Secondary | ICD-10-CM

## 2016-08-07 MED ORDER — LEVOTHYROXINE SODIUM 25 MCG PO TABS: 25.0000 ug | ORAL_TABLET | Freq: Every day | ORAL | 5 refills | Status: DC

## 2016-08-07 NOTE — Telephone Encounter (Signed)
LVM for pt to call back and discuss.  

## 2016-08-07 NOTE — Telephone Encounter (Signed)
I sent her a mychart message last month and never heard from her - please let mer know her results and see if she agrees to go on thyroid medication.  Shelley Sanchez,   Your kidney function, blood counts and liver tests are normal. Your cholesterol is good.    Your vitamin D level is low - you should start taking 1000 units daily.   Your thyroid function is low again. This is something that has been recurring. Since you have positive antibodies it is only a matter of time until you will need to be on medication. I recommend going on it now. It is well tolerated. It is taken first thing in the morning - one hour before breakfast. You should not take any vitamins within 4 hours.   Let me know your thoughts. We would start you on a low dose and recheck your thyroid function in about 2 months.   Let me know if you have any questions or concerns.   Dr. Billey Gosling.

## 2016-08-07 NOTE — Telephone Encounter (Signed)
Med sent.  Recheck tsh in 2 months (ordered)

## 2016-08-07 NOTE — Telephone Encounter (Signed)
Pt called in, I told her the my chart message and she said that she would like to go ahead and start the tyroid meds.    Her pharmacy is the [facility] on file

## 2016-08-08 ENCOUNTER — Other Ambulatory Visit (HOSPITAL_BASED_OUTPATIENT_CLINIC_OR_DEPARTMENT_OTHER): Payer: BLUE CROSS/BLUE SHIELD

## 2016-08-08 DIAGNOSIS — C561 Malignant neoplasm of right ovary: Secondary | ICD-10-CM

## 2016-08-08 NOTE — Telephone Encounter (Signed)
Spoke with pt to inform.  

## 2016-08-09 LAB — CA 125: Cancer Antigen (CA) 125: 21.7 U/mL (ref 0.0–38.1)

## 2016-08-31 ENCOUNTER — Ambulatory Visit: Payer: BLUE CROSS/BLUE SHIELD | Attending: Gynecologic Oncology | Admitting: Gynecologic Oncology

## 2016-08-31 ENCOUNTER — Encounter: Payer: Self-pay | Admitting: Gynecologic Oncology

## 2016-08-31 VITALS — BP 114/67 | HR 65 | Temp 97.9°F | Resp 20 | Wt 170.4 lb

## 2016-08-31 DIAGNOSIS — Z9081 Acquired absence of spleen: Secondary | ICD-10-CM | POA: Diagnosis not present

## 2016-08-31 DIAGNOSIS — Z9221 Personal history of antineoplastic chemotherapy: Secondary | ICD-10-CM | POA: Diagnosis not present

## 2016-08-31 DIAGNOSIS — R599 Enlarged lymph nodes, unspecified: Secondary | ICD-10-CM | POA: Diagnosis not present

## 2016-08-31 DIAGNOSIS — R918 Other nonspecific abnormal finding of lung field: Secondary | ICD-10-CM

## 2016-08-31 DIAGNOSIS — C561 Malignant neoplasm of right ovary: Secondary | ICD-10-CM | POA: Diagnosis not present

## 2016-08-31 DIAGNOSIS — C569 Malignant neoplasm of unspecified ovary: Secondary | ICD-10-CM

## 2016-08-31 DIAGNOSIS — Z90710 Acquired absence of both cervix and uterus: Secondary | ICD-10-CM | POA: Diagnosis not present

## 2016-08-31 DIAGNOSIS — E0590 Thyrotoxicosis, unspecified without thyrotoxic crisis or storm: Secondary | ICD-10-CM | POA: Insufficient documentation

## 2016-08-31 NOTE — Patient Instructions (Signed)
Plan to have a CA 125 and Bmet for your CT scan in September on Sept 20 at the [facility].  CT scan is scheduled for December 18, 2016.  Follow up with Dr Skeet Latch on December 21, 2016.  Please call for any questions or concerns.

## 2016-08-31 NOTE — Progress Notes (Signed)
Office Visit Note: Gyn-Onc  Shelley Sanchez 61 y.o. female  CC:  Ovarian cancer  surveillance  Assessment/Plan:  61 y.o. who underwent optimal debulking on 04/09/2012. 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 She completed six cycles dose dense.  TaxolCarboplatin therapy 08/2012. 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  Treated with splenectomy taxol/carboplatin completed in March 2017  Close follow-up with CA125.   Follow-up in 3 months 11/2016 CT CHEST ABD/PELVIS  For f/u of enlarged but nonavid retrocaval LN immediately prior to visit    HPI:  Oncology History   61 y.o. G3 P3 LNMP in early 42's. In October 2013 Zairah Gramajo noted fatigue, and fecal urge incontinence. Also noted increasing abdominal girth. No change in appetite, no nausea or vomiting. CT Chest/Abd/Pelvis 04/01/2012 notable for ascites so patient was admitted for evaluation. Paracentesis performed 04/01/2012. Results were negative for malignancy.  CA -125 at the time of presentation was 2418.        Ovarian cancer on right [facility])   04/08/2012 Initial Diagnosis    Ovarian cancer on right      04/09/2012 Surgery    exploratory laparotomy total abdominal hysterectomy bilateral salpingo-oophorectomy omentectomy radical debulking to optimal disease on 04/09/2012.  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 The only gross disease present was a 7 mm lesion in the posterior most aspect of the liver.   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 The genes tested were ATM, BARD1, BRCA1, BRCA2, BRIP1, CDH1, CHEK2, EPCAM, MLH1, MRE11A, MSH2, MSH6, MUTYH, NBN, NF1, PALB2, PMS2, PTEN, RAD50, RAD51C,  RAD51D, SMARCA4, STK11, and TP5      11/2014 Relapse/Recurrence    Rising CA 125 noted in 2016. CT CAP 01-11-15 found mass at anterior aspect of spleen 11.5 x 8.2 x 13 cm, with stable unremarkable chest, no ascites, no adenopathy, no pelvic masses. She had splenectomy by Dr Barry Dienes 02-02-15, pathology [id number]) high grade serous carcinoma involving splenic capsule and parenchyma, 9 cm. There was no visualized abdominal carcinomatosis; tail of pancreas was adherent to mass and taken with spleen      12/2014 - 05/2015 Chemotherapy     5  Cycles dense carbo taxol on 05/2015.       10/2015 Imaging    PET 10/2015  IMPRESSION: 1. No definite recurrent metastatic disease in the abdomen or pelvis. 2. Solitary mildly enlarged right paracaval lymph node is stable in size since 07/14/2015 PET-CT, where it appeared non hypermetabolic, suggesting a benign node. Recommend continued attention to this node on follow-up CT or PET-CT.         Interval Hx:  Last Ca 125 02/21/2016 19.9 no SOB no cough, no changes in bowel or bladder habits, no changes in weight    ROS:  No change in bowel or bladder habits, no nausea or emesis, no vaginal bleeding, no SOB no chest pain otherwise 10 point ROS negative  Social Hx:  Resides with her significant other, works full time, overall happy.  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    Past Surgical Hx:  Past Surgical History:  Procedure Laterality Date  . ABDOMINAL HYSTERECTOMY  04/09/2012  Procedure: HYSTERECTOMY ABDOMINAL;  Surgeon: [name], MD PHD;  Location: [facility];  Service: Gynecology;  Laterality: N/A;  . AUGMENTATION MAMMAPLASTY Bilateral 2001  . BREAST ENHANCEMENT SURGERY  2000  . LAPAROTOMY  04/09/2012   Procedure: EXPLORATORY LAPAROTOMY;  Surgeon: [name], MD PHD;  Location: [facility];  Service: Gynecology;  Laterality: N/A;  EXPLORATORY LAPAROTOMY, TOTAL ABDOMINAL HYESTERCTOMY, BILATERAL SALPINGO OOPHERECTOMY  . mohs procedure to  right side of base of nose       Past Medical Hx:  Past Medical History:  Diagnosis Date  . Ascites 03/2012   on CT a/p; s/p paracentesis  . Family history of ovarian cancer   . Headache   . Hyperthyroidism   . Ovarian cancer ([place]) 03/2012 dx   s/p debulk = stage III C poor diff, ov serous ca  . 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 Squamous cell carcinoma of right hand 2006   s/p excision  Genetic testing was normal and did not reveal a mutation in these genes. The genes tested were ATM, BARD1, BRCA1, BRCA2, BRIP1, CDH1, CHEK2, EPCAM, MLH1, MRE11A, MSH2, MSH6, MUTYH, NBN, NF1, PALB2, PMS2, PTEN, RAD50, RAD51C, RAD51D, SMARCA4, STK11, and TP53.   Family Hx:  Family History  Problem Relation Age of Onset  . Melanoma Mother 60       on her back; TAH/BSO in ~30 d/t bleeding  . Rheum arthritis Mother   . Macular degeneration Mother   . Skin cancer Brother 56       squamous cell  . Thyroid cancer Brother 48       s/p thyroidectomy  . Ovarian cancer Maternal Aunt 70       "abd ca" possible ovarian; deceased 49s   PHYSICAL EXAMINATION Vitals BP 114/67 (BP Location: Left Arm, Patient Position: Sitting)   Pulse 65   Temp 97.9 F (36.6 C) (Oral)   Resp 20   Wt 170 lb 6.4 oz (77.3 kg)   BMI 25.91 kg/m   Wt Readings from Last 3 Encounters:  08/31/16 170 lb 6.4 oz (77.3 kg)  06/27/16 170 lb (77.1 kg)  06/20/16 173 lb (78.5 kg)   Neck  Supple without any enlargements.  Cardiovascular  Pulse normal rate, regularity and rhythm.  Lungs  Clear to auscultation bilaterally  Psychiatry  Alert and oriented appropriate mood and affect.  Abdomen  Normoactive bowel sounds, abdomen soft, non-tende. Surgical  sites intact without evidence of hernia or tenderness Genito Urinary  Nl EGBUS, no pelvic or cul de sac masses,  RECTAL: Good tone, no masses no cul de sac nodularity.  Extremities  No bilateral cyanosis, clubbing or edema.   Back:  No CVAT

## 2016-10-17 ENCOUNTER — Other Ambulatory Visit (INDEPENDENT_AMBULATORY_CARE_PROVIDER_SITE_OTHER): Payer: BLUE CROSS/BLUE SHIELD

## 2016-10-17 DIAGNOSIS — E063 Autoimmune thyroiditis: Secondary | ICD-10-CM | POA: Diagnosis not present

## 2016-10-17 DIAGNOSIS — E038 Other specified hypothyroidism: Secondary | ICD-10-CM | POA: Diagnosis not present

## 2016-10-17 LAB — TSH: TSH: 3.54 u[IU]/mL (ref 0.35–4.50)

## 2016-10-26 ENCOUNTER — Ambulatory Visit: Payer: BLUE CROSS/BLUE SHIELD | Admitting: Gynecologic Oncology

## 2016-12-14 ENCOUNTER — Other Ambulatory Visit (HOSPITAL_BASED_OUTPATIENT_CLINIC_OR_DEPARTMENT_OTHER): Payer: BLUE CROSS/BLUE SHIELD

## 2016-12-14 DIAGNOSIS — C569 Malignant neoplasm of unspecified ovary: Secondary | ICD-10-CM | POA: Diagnosis not present

## 2016-12-14 LAB — BASIC METABOLIC PANEL
Anion Gap: 8 mEq/L (ref 3–11)
BUN: 23.2 mg/dL (ref 7.0–26.0)
CALCIUM: 9.8 mg/dL (ref 8.4–10.4)
CHLORIDE: 105 meq/L (ref 98–109)
CO2: 27 meq/L (ref 22–29)
CREATININE: 1 mg/dL (ref 0.6–1.1)
EGFR: 60 mL/min/{1.73_m2} — ABNORMAL LOW (ref >90)
GLUCOSE: 97 mg/dL (ref 70–140)
Potassium: 5.1 mEq/L (ref 3.5–5.1)
Sodium: 140 mEq/L (ref 136–145)

## 2016-12-15 LAB — CA 125: CANCER ANTIGEN (CA) 125: 20.7 U/mL (ref 0.0–38.1)

## 2016-12-18 ENCOUNTER — Ambulatory Visit (HOSPITAL_COMMUNITY)
Admission: RE | Admit: 2016-12-18 | Discharge: 2016-12-18 | Disposition: A | Discharge status: Home / Self Care | Payer: BLUE CROSS/BLUE SHIELD | Source: Ambulatory Visit | Attending: Gynecologic Oncology | Admitting: Gynecologic Oncology

## 2016-12-18 DIAGNOSIS — Z90710 Acquired absence of both cervix and uterus: Secondary | ICD-10-CM | POA: Insufficient documentation

## 2016-12-18 DIAGNOSIS — K7689 Other specified diseases of liver: Secondary | ICD-10-CM | POA: Diagnosis not present

## 2016-12-18 DIAGNOSIS — C569 Malignant neoplasm of unspecified ovary: Secondary | ICD-10-CM | POA: Insufficient documentation

## 2016-12-18 DIAGNOSIS — Z9081 Acquired absence of spleen: Secondary | ICD-10-CM | POA: Insufficient documentation

## 2016-12-18 DIAGNOSIS — R911 Solitary pulmonary nodule: Secondary | ICD-10-CM | POA: Insufficient documentation

## 2016-12-18 MED ORDER — IOPAMIDOL (ISOVUE-300) INJECTION 61%
INTRAVENOUS | Status: AC
  Filled 2016-12-18: qty 100

## 2016-12-18 MED ORDER — IOPAMIDOL (ISOVUE-300) INJECTION 61%
100.0000 mL | Freq: Once | INTRAVENOUS | Status: AC | PRN
  Administered 2016-12-18: 100 mL via INTRAVENOUS

## 2016-12-21 ENCOUNTER — Other Ambulatory Visit: Payer: BLUE CROSS/BLUE SHIELD

## 2016-12-21 ENCOUNTER — Encounter: Payer: Self-pay | Admitting: Gynecologic Oncology

## 2016-12-21 ENCOUNTER — Ambulatory Visit: Payer: BLUE CROSS/BLUE SHIELD | Attending: Gynecologic Oncology | Admitting: Gynecologic Oncology

## 2016-12-21 VITALS — BP 110/43 | HR 75 | Temp 98.3°F | Resp 18 | Ht 68.0 in | Wt 175.3 lb

## 2016-12-21 DIAGNOSIS — Z808 Family history of malignant neoplasm of other organs or systems: Secondary | ICD-10-CM | POA: Diagnosis not present

## 2016-12-21 DIAGNOSIS — E0590 Thyrotoxicosis, unspecified without thyrotoxic crisis or storm: Secondary | ICD-10-CM | POA: Diagnosis not present

## 2016-12-21 DIAGNOSIS — R590 Localized enlarged lymph nodes: Secondary | ICD-10-CM

## 2016-12-21 DIAGNOSIS — Z9221 Personal history of antineoplastic chemotherapy: Secondary | ICD-10-CM

## 2016-12-21 DIAGNOSIS — Z8041 Family history of malignant neoplasm of ovary: Secondary | ICD-10-CM | POA: Diagnosis not present

## 2016-12-21 DIAGNOSIS — C569 Malignant neoplasm of unspecified ovary: Secondary | ICD-10-CM | POA: Diagnosis not present

## 2016-12-21 DIAGNOSIS — Z9889 Other specified postprocedural states: Secondary | ICD-10-CM | POA: Diagnosis not present

## 2016-12-21 DIAGNOSIS — Z90710 Acquired absence of both cervix and uterus: Secondary | ICD-10-CM | POA: Insufficient documentation

## 2016-12-21 DIAGNOSIS — C561 Malignant neoplasm of right ovary: Secondary | ICD-10-CM | POA: Insufficient documentation

## 2016-12-21 DIAGNOSIS — Z8261 Family history of arthritis: Secondary | ICD-10-CM | POA: Insufficient documentation

## 2016-12-21 DIAGNOSIS — Z85828 Personal history of other malignant neoplasm of skin: Secondary | ICD-10-CM | POA: Insufficient documentation

## 2016-12-21 NOTE — Progress Notes (Signed)
Office Visit Note: Gyn-Onc  Shelley Sanchez 61 y.o. female  CC:  Ovarian cancer  surveillance  Assessment/Plan:  61 y.o. who underwent optimal debulking on 04/09/2012. 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 She completed six cycles dose dense.  TaxolCarboplatin therapy 08/2012. 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  Treated with splenectomy taxol/carboplatin completed in March 2017  Close follow-up with CA125.   Follow-up in 3 months 12 /2018 CT CHEST ABD/PELVIS  For f/u of enlarged but nonavid retrocaval LN  Without changes.    HPI:  Oncology History   61 y.o. G3 P3 LNMP in early 37's. In October 2013 Trinty Dom noted fatigue, and fecal urge incontinence. Also noted increasing abdominal girth. No change in appetite, no nausea or vomiting. CT Chest/Abd/Pelvis 04/01/2012 notable for ascites so patient was admitted for evaluation. Paracentesis performed 04/01/2012. Results were negative for malignancy.  CA -125 at the time of presentation was 2418.        Ovarian cancer on right [facility])   04/08/2012 Initial Diagnosis    Ovarian cancer on right      04/09/2012 Surgery    exploratory laparotomy total abdominal hysterectomy bilateral salpingo-oophorectomy omentectomy radical debulking to optimal disease on 04/09/2012.  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 The only gross disease present was a 7 mm lesion in the posterior most aspect of the liver.   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 The genes tested were ATM, BARD1, BRCA1, BRCA2, BRIP1, CDH1, CHEK2, EPCAM, MLH1, MRE11A, MSH2, MSH6, MUTYH, NBN, NF1, PALB2, PMS2, PTEN, RAD50, RAD51C, RAD51D,  SMARCA4, STK11, and TP5      11/2014 Relapse/Recurrence    Rising CA 125 noted in 2016. CT CAP 01-11-15 found mass at anterior aspect of spleen 11.5 x 8.2 x 13 cm, with stable unremarkable chest, no ascites, no adenopathy, no pelvic masses. She had splenectomy by Dr Barry Dienes 02-02-15, pathology [phone]) high grade serous carcinoma involving splenic capsule and parenchyma, 9 cm. There was no visualized abdominal carcinomatosis; tail of pancreas was adherent to mass and taken with spleen      12/2014 - 05/2015 Chemotherapy     5  Cycles dense carbo taxol on 05/2015.       10/2015 Imaging    PET 10/2015  IMPRESSION: 1. No definite recurrent metastatic disease in the abdomen or pelvis. 2. Solitary mildly enlarged right paracaval lymph node is stable in size since 07/14/2015 PET-CT, where it appeared non hypermetabolic, suggesting a benign node. Recommend continued attention to this node on follow-up CT or PET-CT.       12/18/2016 Imaging    CT Chest/Abd/Pelvis IMPRESSION: 1. Status post hysterectomy and bilateral salpingo-oophorectomy and splenectomy. 2. No CT findings to suggest metastatic disease involving the chest, abdomen and pelvis. 3. Stable 4 mm right lower lobe pulmonary nodule. No new pulmonary lesions.        Interval Hx:  Last Ca 125 02/21/2016 19.9 no SOB no cough, no changes in bowel or bladder habits, no changes in weight    ROS:  No change in bowel or bladder habits, no nausea or emesis, no vaginal bleeding, no SOB no chest pain otherwise  10 point ROS negative  Social Hx:  Resides with her significant other, works full time, overall happy.  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    Past Surgical Hx:  Past Surgical History:  Procedure Laterality Date  . ABDOMINAL HYSTERECTOMY  04/09/2012   Procedure: HYSTERECTOMY ABDOMINAL;  Surgeon: [name], MD PHD;  [facility]: [facility];  Service: Gynecology;  Laterality: N/A;  . AUGMENTATION MAMMAPLASTY Bilateral  2001  . BREAST ENHANCEMENT SURGERY  2000  . LAPAROTOMY  04/09/2012   Procedure: EXPLORATORY LAPAROTOMY;  Surgeon: [name], MD PHD;  [facility]: [facility];  Service: Gynecology;  Laterality: N/A;  EXPLORATORY LAPAROTOMY, TOTAL ABDOMINAL HYESTERCTOMY, BILATERAL SALPINGO OOPHERECTOMY  . mohs procedure to right side of base of nose       Past Medical Hx:  Past Medical History:  Diagnosis Date  . Ascites 03/2012   on CT a/p; s/p paracentesis  . Family history of ovarian cancer   . Headache   . Hyperthyroidism   . Ovarian cancer ([place]) 03/2012 dx   s/p debulk = stage III C poor diff, ov serous ca  . 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 Squamous cell carcinoma of right hand 2006   s/p excision  Genetic testing was normal and did not reveal a mutation in these genes. The genes tested were ATM, BARD1, BRCA1, BRCA2, BRIP1, CDH1, CHEK2, EPCAM, MLH1, MRE11A, MSH2, MSH6, MUTYH, NBN, NF1, PALB2, PMS2, PTEN, RAD50, RAD51C, RAD51D, SMARCA4, STK11, and TP53.   Family Hx:  Family History  Problem Relation Age of Onset  . Melanoma Mother 76       on her back; TAH/BSO in ~30 d/t bleeding  . Rheum arthritis Mother   . Macular degeneration Mother   . Skin cancer Brother 56       squamous cell  . Thyroid cancer Brother 5       s/p thyroidectomy  . Ovarian cancer Maternal Aunt 50       "abd ca" possible ovarian; deceased 61s   PHYSICAL EXAMINATION Vitals There were no vitals taken for this visit.  Wt Readings from Last 3 Encounters:  08/31/16 170 lb 6.4 oz (77.3 kg)  06/27/16 170 lb (77.1 kg)  06/20/16 173 lb (78.5 kg)   Neck  Supple without any enlargements.  Cardiovascular  Pulse normal rate, regularity and rhythm.  Lungs  Clear to auscultation bilaterally  Psychiatry  Alert and oriented appropriate mood and affect.  Abdomen  Normoactive bowel sounds, abdomen soft, non-tende. Surgical  sites intact without evidence of hernia or tenderness Genito Urinary  Nl  EGBUS, no pelvic or cul de sac masses,  RECTAL: Good tone, no masses no cul de sac nodularity.  Extremities  No bilateral cyanosis, clubbing or edema.   Back:  No CVAT

## 2016-12-21 NOTE — Patient Instructions (Signed)
Plan to have a CA 125 today and we will release the results to you in Mychart.  Plan to follow up in three months or sooner if needed.

## 2016-12-22 ENCOUNTER — Other Ambulatory Visit (HOSPITAL_BASED_OUTPATIENT_CLINIC_OR_DEPARTMENT_OTHER): Payer: BLUE CROSS/BLUE SHIELD

## 2016-12-22 DIAGNOSIS — C569 Malignant neoplasm of unspecified ovary: Secondary | ICD-10-CM

## 2016-12-23 LAB — CA 125: CANCER ANTIGEN (CA) 125: 20.2 U/mL (ref 0.0–38.1)

## 2017-02-06 IMAGING — CT CT ABD-PELV W/ CM
2 of 5 series · 16 of 46 positions shown, 18 images · IV contrast (OMNIPAQUE)
Comparison: 01/11/2015

CLINICAL DATA: Ovarian cancer diagnosed in 2116 with chemotherapy
completed 7258. Splenic recurrence [DATE]. Splenectomy. Tubal
ligation. No current complaints.

EXAM:
CT ABDOMEN AND PELVIS WITH CONTRAST
TECHNIQUE: Multidetector CT imaging of the abdomen and pelvis was performed
using the standard protocol following bolus administration of
intravenous contrast.
CONTRAST:  100mL DWTZVL-KGG IOPAMIDOL (DWTZVL-KGG) INJECTION 61%

[Series 2: rtn a/p with · axial · 0.76mm/px · z∈[-524,-108]mm · 13 of 93 slices shown, 15 images]
[im 5/93  soft-tissue]
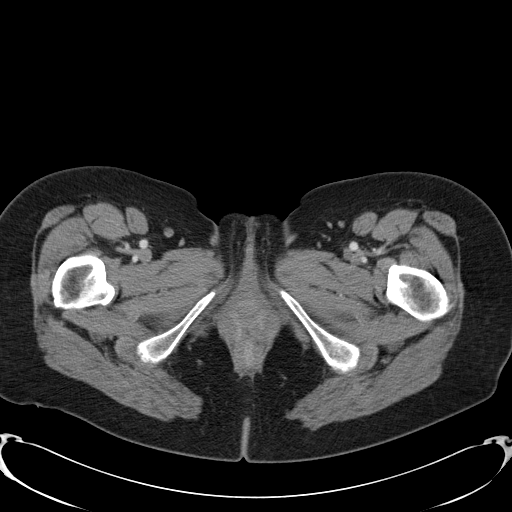
[im 5/93  bone]
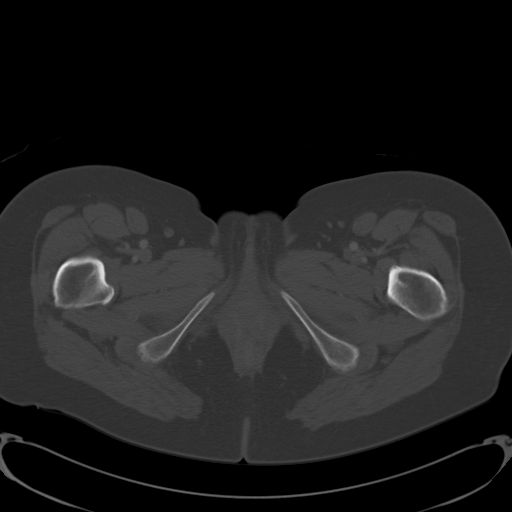
[im 15/93  soft-tissue]
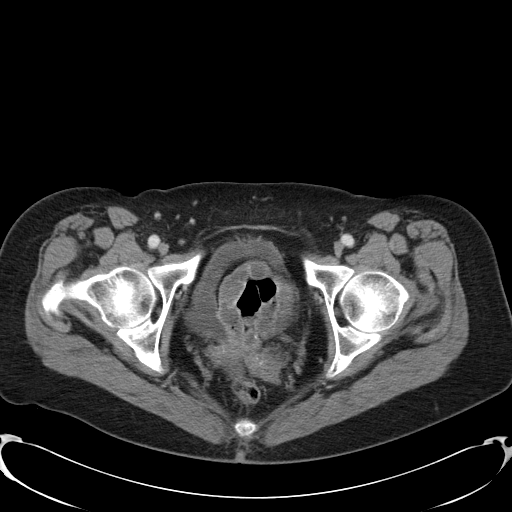
[im 20/93  soft-tissue]
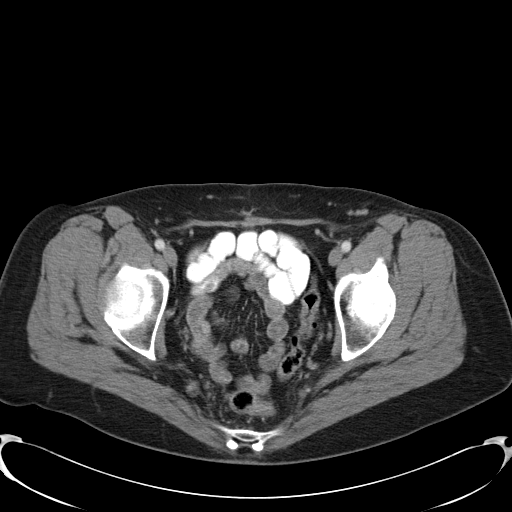
[im 25/93  soft-tissue]
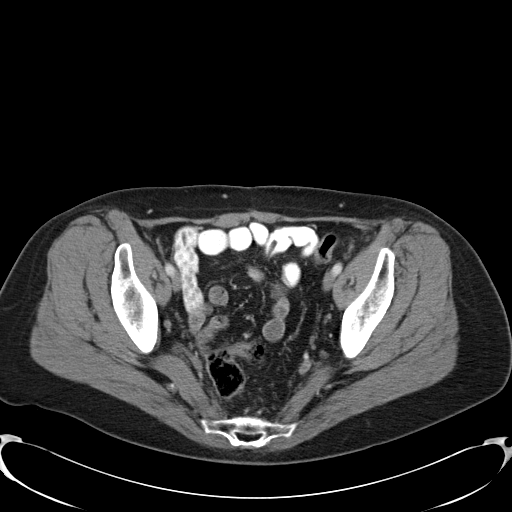
[im 34/93  soft-tissue]
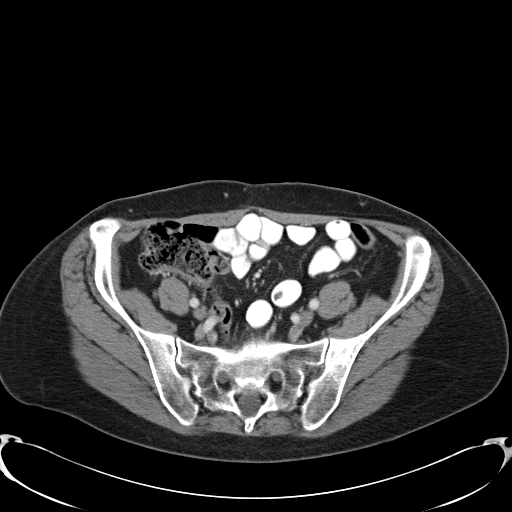
[im 39/93  soft-tissue]
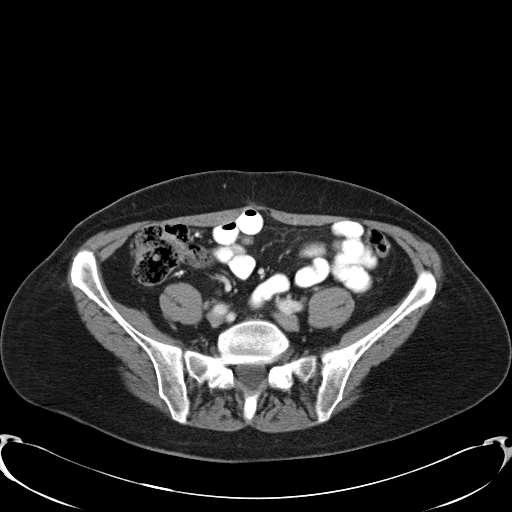
[im 49/93  soft-tissue]
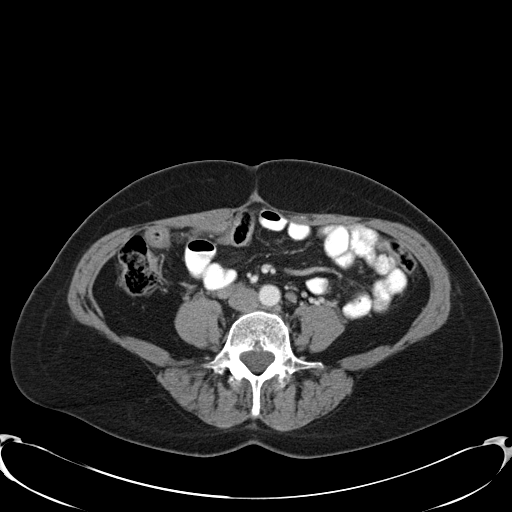
[im 54/93  soft-tissue]
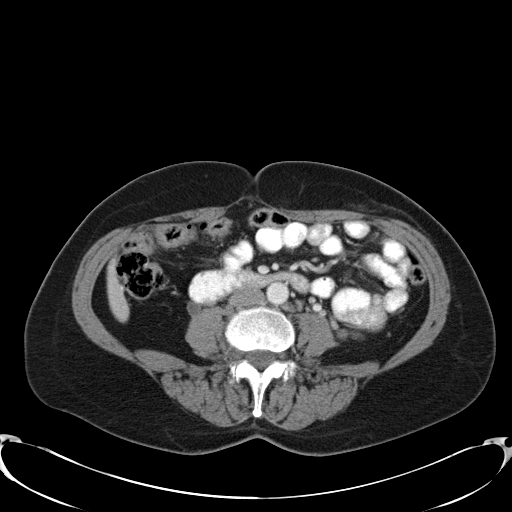
[im 59/93  soft-tissue]
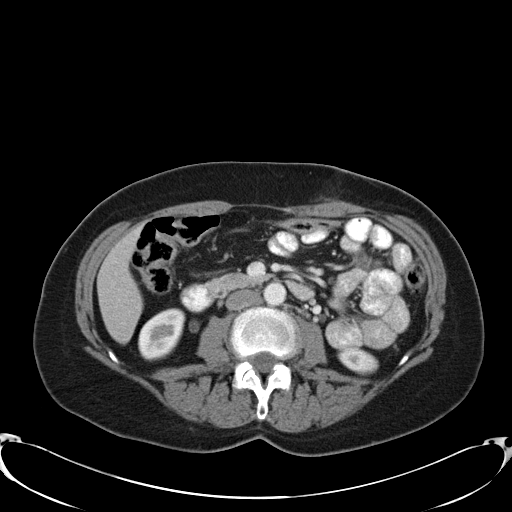
[im 59/93  bone]
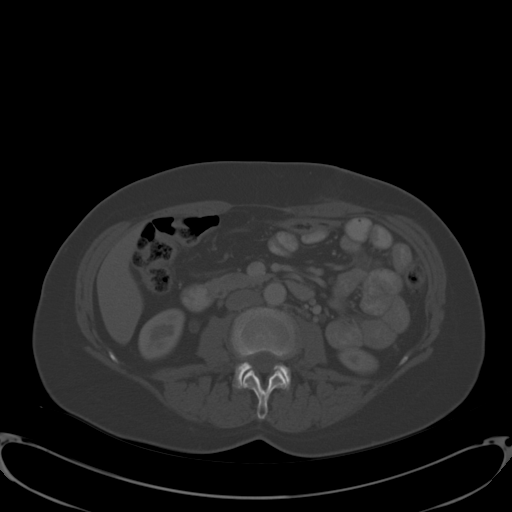
[im 68/93  soft-tissue]
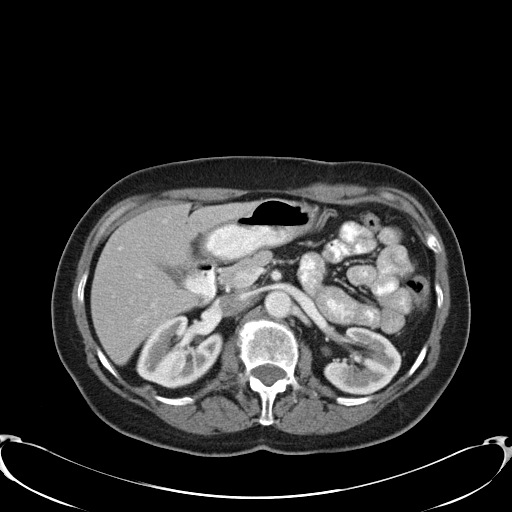
[im 73/93  soft-tissue]
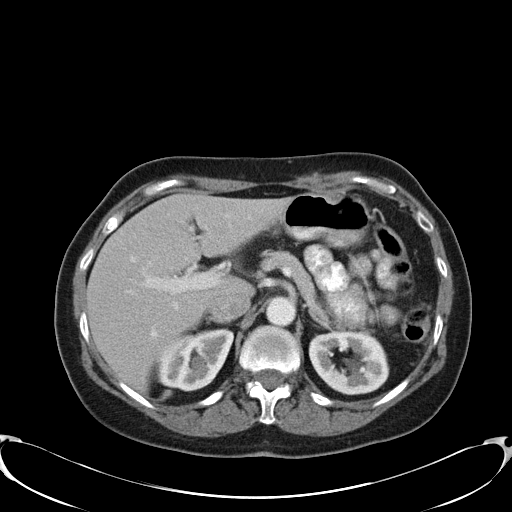
[im 78/93  soft-tissue]
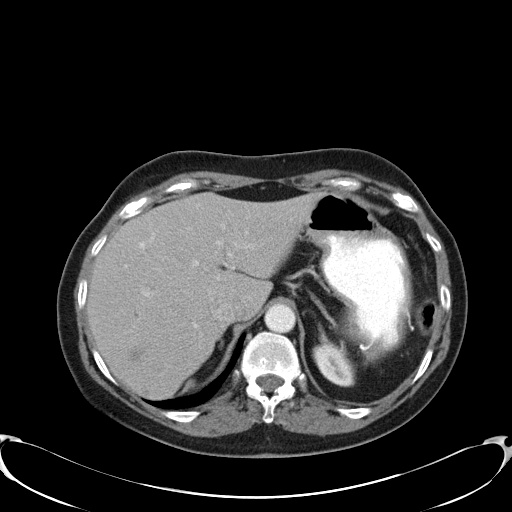
[im 88/93  soft-tissue]
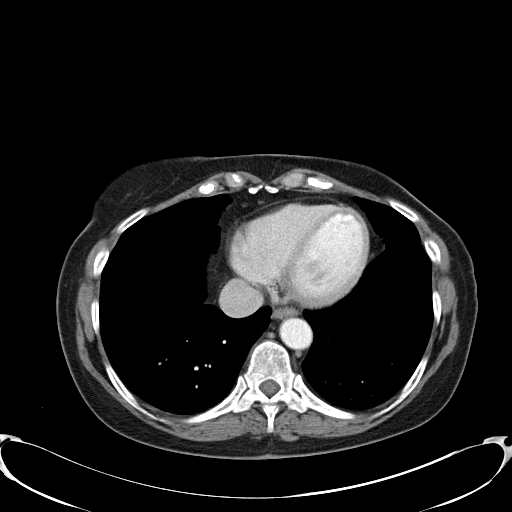

[Series 602: <mpr thick range> · coronal · 0.91mm/px · 3 of 85 slices shown]
[im 29/85  soft-tissue]
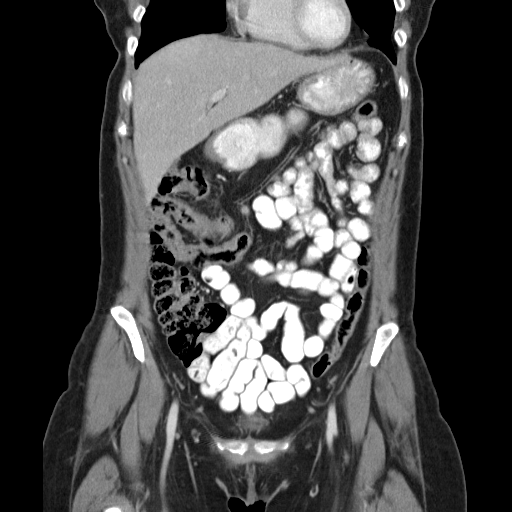
[im 38/85  soft-tissue]
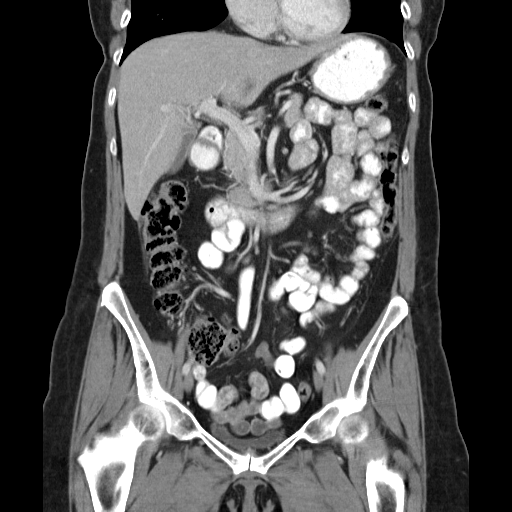
[im 47/85  soft-tissue]
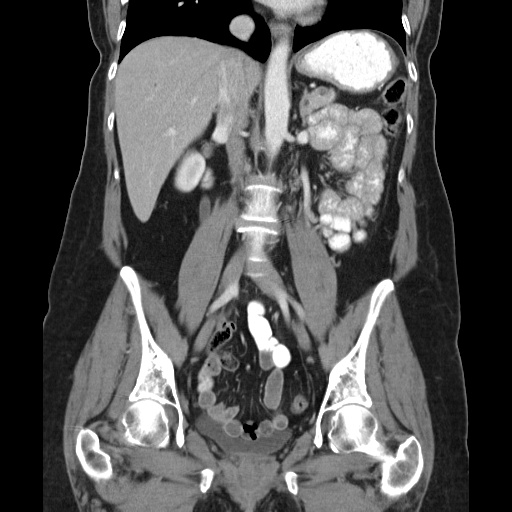

[16 of 46 positions shown; findings below may reference images not displayed]

FINDINGS: Lower chest: 3 mm right lung base nodule on image 1/ series 4 is
present back on the prior PET of 10/15/2012, and can therefore be
considered benign. Bilateral breast implants. Normal heart size
without pericardial or pleural effusion.

Hepatobiliary: Dominant right hepatic lobe cyst of 3.8 cm is
similar. There are scattered too small to characterize lesions
throughout the liver which are favored to represent cysts or
possibly bile duct hamartomas. A vague area of hyperattenuation or
hyper enhancement in the high right lobe of the liver measures on
the order of 3.3 x 3.1 cm on image 10/series 2 and is not readily
apparent on the prior exam. Also coronal image 31. Mild intrahepatic
duct dilatation is identified within the adjacent right lobe of the
liver, most apparent on coronal image 32/series 602. This is new.

Normal gallbladder, without common duct dilatation.

Pancreas: Normal, without mass or ductal dilatation.

Spleen: Interval splenectomy, without residual disease in the left
upper quadrant.

Adrenals/Urinary Tract: Normal adrenal glands. Minimal upper pole
right sided caliectasis is similar. Minimal proximal right ureteric
prominence is also not significantly changed and without cause.
Normal left kidney and urinary bladder.

Stomach/Bowel: Normal stomach, without wall thickening. Normal
colon, appendix, and terminal ileum. Normal small bowel.

Vascular/Lymphatic: Normal caliber of the aorta and branch vessels.
No abdominopelvic adenopathy.

Reproductive: Hysterectomy. No adnexal mass. Subtle asymmetry of the
vaginal cuff, slightly greater on the left. Example image 80/series
2. Felt to be similar and within normal variation.

Other: No significant free fluid. No evidence of omental or
peritoneal disease.

Musculoskeletal: Vague sclerosis involving the left iliac is similar
and favored to be degenerative.
IMPRESSION: 1. Interval splenectomy, without residual or locally recurrent
disease in the left upper quadrant.
2. Vague hyperattenuation or hyper enhancement along the capsule of
the high right lobe of the liver. Concurrent subtle adjacent right
hepatic lobe intrahepatic duct dilatation. Although this does not
have the typical appearance of an ovarian cancer metastasis, this
cannot be excluded. Potential clinical strategies include further
evaluation with PET or pre and post contrast abdominal MRI.
3. Otherwise, no evidence of metastatic disease within the abdomen
or pelvis.
4. Minimal right upper pole caliectasis and prominence of the
proximal right ureter, similar and favored to be within normal
variation.

## 2017-03-15 ENCOUNTER — Encounter: Payer: Self-pay | Admitting: Gynecologic Oncology

## 2017-03-15 ENCOUNTER — Other Ambulatory Visit (HOSPITAL_BASED_OUTPATIENT_CLINIC_OR_DEPARTMENT_OTHER): Payer: BLUE CROSS/BLUE SHIELD

## 2017-03-15 ENCOUNTER — Ambulatory Visit: Payer: BLUE CROSS/BLUE SHIELD | Attending: Gynecologic Oncology | Admitting: Gynecologic Oncology

## 2017-03-15 VITALS — BP 106/65 | HR 76 | Temp 97.5°F | Resp 20 | Ht 68.0 in | Wt 178.0 lb

## 2017-03-15 DIAGNOSIS — R599 Enlarged lymph nodes, unspecified: Secondary | ICD-10-CM

## 2017-03-15 DIAGNOSIS — C561 Malignant neoplasm of right ovary: Secondary | ICD-10-CM

## 2017-03-15 DIAGNOSIS — Z9081 Acquired absence of spleen: Secondary | ICD-10-CM | POA: Insufficient documentation

## 2017-03-15 DIAGNOSIS — Z9221 Personal history of antineoplastic chemotherapy: Secondary | ICD-10-CM

## 2017-03-15 DIAGNOSIS — Z8543 Personal history of malignant neoplasm of ovary: Secondary | ICD-10-CM

## 2017-03-15 DIAGNOSIS — C569 Malignant neoplasm of unspecified ovary: Secondary | ICD-10-CM | POA: Diagnosis not present

## 2017-03-15 DIAGNOSIS — E0590 Thyrotoxicosis, unspecified without thyrotoxic crisis or storm: Secondary | ICD-10-CM | POA: Diagnosis not present

## 2017-03-15 LAB — BUN AND CREATININE (CC13)
BUN: 12.1 mg/dL (ref 7.0–26.0)
CREATININE: 0.9 mg/dL (ref 0.6–1.1)

## 2017-03-15 NOTE — Progress Notes (Signed)
Office Visit Note: Gyn-Onc  Shelley Sanchez 61 y.o. female  CC:  Ovarian cancer  surveillance  Assessment/Plan:  61 y.o. who underwent optimal debulking on 04/09/2012. 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 She completed six cycles dose dense.  TaxolCarboplatin therapy 08/2012. 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  Treated with splenectomy taxol/carboplatin completed in March 2017  Close follow-up with CA125.   Follow-up in 3 months with GYN oncology CT CHEST ABD/PELVIS  for f/u of enlarged but nonavid retrocaval LN  9/2018without changes.    HPI:  Oncology History   61 y.o. G3 P3 LNMP in early 76's. In October 2013 Shelley Sanchez noted fatigue, and fecal urge incontinence. Also noted increasing abdominal girth. No change in appetite, no nausea or vomiting. CT Chest/Abd/Pelvis 04/01/2012 notable for ascites so patient was admitted for evaluation. Paracentesis performed 04/01/2012. Results were negative for malignancy.  CA -125 at the time of presentation was 2418.        Ovarian cancer on right [facility])   04/08/2012 Initial Diagnosis    Ovarian cancer on right      04/09/2012 Surgery    exploratory laparotomy total abdominal hysterectomy bilateral salpingo-oophorectomy omentectomy radical debulking to optimal disease on 04/09/2012.  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 The only gross disease present was a 7 mm lesion in the posterior most aspect of the liver.   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 The genes tested were ATM, BARD1, BRCA1, BRCA2, BRIP1, CDH1, CHEK2, EPCAM, MLH1, MRE11A, MSH2, MSH6, MUTYH, NBN, NF1, PALB2, PMS2, PTEN, RAD50,  RAD51C, RAD51D, SMARCA4, STK11, and TP5      11/2014 Relapse/Recurrence    Rising CA 125 noted in 2016. CT CAP 01-11-15 found mass at anterior aspect of spleen 11.5 x 8.2 x 13 cm, with stable unremarkable chest, no ascites, no adenopathy, no pelvic masses. She had splenectomy by Dr Barry Dienes 02-02-15, pathology [id number]) high grade serous carcinoma involving splenic capsule and parenchyma, 9 cm. There was no visualized abdominal carcinomatosis; tail of pancreas was adherent to mass and taken with spleen      12/2014 - 05/2015 Chemotherapy     5  Cycles dense carbo taxol on 05/2015.       10/2015 Imaging    PET 10/2015  IMPRESSION: 1. No definite recurrent metastatic disease in the abdomen or pelvis. 2. Solitary mildly enlarged right paracaval lymph node is stable in size since 07/14/2015 PET-CT, where it appeared non hypermetabolic, suggesting a benign node. Recommend continued attention to this node on follow-up CT or PET-CT.       12/18/2016 Imaging    CT Chest/Abd/Pelvis IMPRESSION: 1. Status post hysterectomy and bilateral salpingo-oophorectomy and splenectomy. 2. No CT findings to suggest metastatic disease involving the chest, abdomen and pelvis. 3. Stable 4 mm right lower lobe pulmonary nodule. No new pulmonary lesions.        Interval Hx:  Last Ca 125 20.2  SOB no cough, no changes in bowel or bladder habits,     ROS:  No change in bowel or bladder habits, no nausea or emesis, no vaginal bleeding, no SOB no chest pain otherwise 10 point ROS  negative  Social Hx:  Resides with her significant other, works full time, overall happy.  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  Looking forward to the Christmas celebrations.  Past Surgical Hx:  Past Surgical History:  Procedure Laterality Date  . ABDOMINAL HYSTERECTOMY  04/09/2012   Procedure: HYSTERECTOMY ABDOMINAL;  Surgeon: [name], MD PHD;  Location: [facility];  Service: Gynecology;  Laterality: N/A;  .  AUGMENTATION MAMMAPLASTY Bilateral 2001  . BREAST ENHANCEMENT SURGERY  2000  . LAPAROTOMY  04/09/2012   Procedure: EXPLORATORY LAPAROTOMY;  Surgeon: [name], MD PHD;  Location: [facility];  Service: Gynecology;  Laterality: N/A;  EXPLORATORY LAPAROTOMY, TOTAL ABDOMINAL HYESTERCTOMY, BILATERAL SALPINGO OOPHERECTOMY  . mohs procedure to right side of base of nose       Past Medical Hx:  Past Medical History:  Diagnosis Date  . Ascites 03/2012   on CT a/p; s/p paracentesis  . Family history of ovarian cancer   . Headache   . Hyperthyroidism   . Ovarian cancer ([place]) 03/2012 dx   s/p debulk = stage III C poor diff, ov serous ca  . 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 Squamous cell carcinoma of right hand 2006   s/p excision  Genetic testing was normal and did not reveal a mutation in these genes. The genes tested were ATM, BARD1, BRCA1, BRCA2, BRIP1, CDH1, CHEK2, EPCAM, MLH1, MRE11A, MSH2, MSH6, MUTYH, NBN, NF1, PALB2, PMS2, PTEN, RAD50, RAD51C, RAD51D, SMARCA4, STK11, and TP53.   Family Hx:  Family History  Problem Relation Age of Onset  . Melanoma Mother 54       on her back; TAH/BSO in ~30 d/t bleeding  . Rheum arthritis Mother   . Macular degeneration Mother   . Skin cancer Brother 56       squamous cell  . Thyroid cancer Brother 72       s/p thyroidectomy  . Ovarian cancer Maternal Aunt 15       "abd ca" possible ovarian; deceased 88s   PHYSICAL EXAMINATION Vitals BP 106/65 (Patient Position: Sitting)   Pulse 76   Temp (!) 97.5 F (36.4 C) (Oral)   Resp 20   Ht '5\' 8"'  (1.727 m)   Wt 178 lb (80.7 kg)   SpO2 99%   BMI 27.06 kg/m   Wt Readings from Last 3 Encounters:  03/15/17 178 lb (80.7 kg)  12/21/16 175 lb 4.8 oz (79.5 kg)  08/31/16 170 lb 6.4 oz (77.3 kg)   Neck  Supple without any enlargements.  Cardiovascular  Pulse normal rate, regularity and rhythm.  Lungs  Clear to auscultation bilaterally  Psychiatry  Alert and oriented  appropriate mood and affect.  Abdomen  Normoactive bowel sounds, abdomen soft, non-tende. Surgical  sites intact without evidence of hernia or tenderness, no ascites or masses Genito Urinary  Nl EGBUS, no pelvic or cul de sac masses,  RECTAL: Good tone, no masses no cul de sac nodularity.  Extremities  No bilateral cyanosis, clubbing or edema.   Back:  No CVAT

## 2017-03-15 NOTE — Patient Instructions (Signed)
Plan on having a CA 125 today and we will release the results in Mychart to you.  Plan to follow up in three months or sooner if needed.  Please call for any questions or concerns.

## 2017-03-16 ENCOUNTER — Telehealth: Payer: Self-pay | Admitting: Gynecologic Oncology

## 2017-03-16 LAB — CA 125: Cancer Antigen (CA) 125: 26.5 U/mL (ref 0.0–38.1)

## 2017-03-16 NOTE — Telephone Encounter (Signed)
Left message with CA 125.

## 2017-03-18 ENCOUNTER — Other Ambulatory Visit: Payer: Self-pay | Admitting: Internal Medicine

## 2017-03-21 ENCOUNTER — Telehealth: Payer: Self-pay | Admitting: Gynecologic Oncology

## 2017-03-21 NOTE — Telephone Encounter (Signed)
Left message with CA 125 and advised to call for any needs.

## 2017-04-05 ENCOUNTER — Other Ambulatory Visit: Payer: Self-pay | Admitting: Emergency Medicine

## 2017-04-05 MED ORDER — LEVOTHYROXINE SODIUM 25 MCG PO TABS: 25.0000 ug | ORAL_TABLET | Freq: Every day | ORAL | 0 refills | Status: DC

## 2017-04-09 ENCOUNTER — Ambulatory Visit (INDEPENDENT_AMBULATORY_CARE_PROVIDER_SITE_OTHER): Payer: BLUE CROSS/BLUE SHIELD

## 2017-04-09 DIAGNOSIS — Z23 Encounter for immunization: Secondary | ICD-10-CM | POA: Diagnosis not present

## 2017-04-23 ENCOUNTER — Ambulatory Visit: Payer: BLUE CROSS/BLUE SHIELD | Admitting: Internal Medicine

## 2017-06-04 DIAGNOSIS — H04123 Dry eye syndrome of bilateral lacrimal glands: Secondary | ICD-10-CM | POA: Diagnosis not present

## 2017-06-04 DIAGNOSIS — H5201 Hypermetropia, right eye: Secondary | ICD-10-CM | POA: Diagnosis not present

## 2017-06-04 DIAGNOSIS — H5212 Myopia, left eye: Secondary | ICD-10-CM | POA: Diagnosis not present

## 2017-06-13 ENCOUNTER — Telehealth: Payer: Self-pay | Admitting: *Deleted

## 2017-06-13 NOTE — Telephone Encounter (Signed)
Returned the patient's call and left a message with the date/time of the appt for March 26th

## 2017-06-19 ENCOUNTER — Encounter: Payer: Self-pay | Admitting: Gynecologic Oncology

## 2017-06-19 ENCOUNTER — Other Ambulatory Visit (HOSPITAL_COMMUNITY)
Admission: RE | Admit: 2017-06-19 | Discharge: 2017-06-19 | Disposition: A | Discharge status: Home / Self Care | Payer: BLUE CROSS/BLUE SHIELD | Source: Ambulatory Visit | Attending: Gynecologic Oncology | Admitting: Gynecologic Oncology

## 2017-06-19 ENCOUNTER — Inpatient Hospital Stay (HOSPITAL_BASED_OUTPATIENT_CLINIC_OR_DEPARTMENT_OTHER): Payer: BLUE CROSS/BLUE SHIELD | Admitting: Gynecologic Oncology

## 2017-06-19 ENCOUNTER — Inpatient Hospital Stay: Payer: BLUE CROSS/BLUE SHIELD | Attending: Gynecologic Oncology

## 2017-06-19 VITALS — BP 128/68 | HR 72 | Temp 98.0°F | Resp 18 | Ht 67.0 in | Wt 178.9 lb

## 2017-06-19 DIAGNOSIS — Z9221 Personal history of antineoplastic chemotherapy: Secondary | ICD-10-CM

## 2017-06-19 DIAGNOSIS — Z90710 Acquired absence of both cervix and uterus: Secondary | ICD-10-CM | POA: Diagnosis not present

## 2017-06-19 DIAGNOSIS — C561 Malignant neoplasm of right ovary: Secondary | ICD-10-CM | POA: Insufficient documentation

## 2017-06-19 DIAGNOSIS — Z90722 Acquired absence of ovaries, bilateral: Secondary | ICD-10-CM | POA: Insufficient documentation

## 2017-06-19 DIAGNOSIS — C569 Malignant neoplasm of unspecified ovary: Secondary | ICD-10-CM | POA: Diagnosis not present

## 2017-06-19 NOTE — Progress Notes (Signed)
BRCA testing ordered on tumor from surgical specimen from 03/2012.  Ordered with Suanne Marker at [facility] path per Dr. Skeet Latch.

## 2017-06-19 NOTE — Patient Instructions (Signed)
Plan on having a CA 125 drawn today. We will notify you of the results. Plan to follow up in three months or sooner if needed.

## 2017-06-19 NOTE — Progress Notes (Signed)
Office Visit Note: Gyn-Onc  Keiko Myricks 62 y.o. female  CC:  Ovarian cancer  surveillance  Assessment/Plan:  62 y.o. who underwent optimal debulking on 04/09/2012. 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 She completed six cycles dose dense.  TaxolCarboplatin therapy 08/2012. 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  Treated with splenectomy taxol/carboplatin completed in March 2017   Close follow-up with CA125.   Follow-up in 3 months with GYN oncology No evidence of germline mutations.  Will request tumor testing for BRCA 1/2 mutations  HPI:  Oncology History   62 y.o. G3 P3 LNMP in early 69's. In October 2013 Winona Sochacki noted fatigue, and fecal urge incontinence. Also noted increasing abdominal girth. No change in appetite, no nausea or vomiting. CT Chest/Abd/Pelvis 04/01/2012 notable for ascites so patient was admitted for evaluation. Paracentesis performed 04/01/2012. Results were negative for malignancy.  CA -125 at the time of presentation was 2418.        Ovarian cancer on right [facility])   04/08/2012 Initial Diagnosis    Ovarian cancer on right      04/09/2012 Surgery    exploratory laparotomy total abdominal hysterectomy bilateral salpingo-oophorectomy omentectomy radical debulking to optimal disease on 04/09/2012.  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 The only gross disease present was a 7 mm lesion in the posterior most aspect of the liver.   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 The genes tested were ATM, BARD1, BRCA1, BRCA2, BRIP1, CDH1, CHEK2, EPCAM, MLH1, MRE11A, MSH2, MSH6, MUTYH, NBN, NF1, PALB2, PMS2, PTEN, RAD50, RAD51C,  RAD51D, SMARCA4, STK11, and TP5      11/2014 Relapse/Recurrence    Rising CA 125 noted in 2016. CT CAP 01-11-15 found mass at anterior aspect of spleen 11.5 x 8.2 x 13 cm, with stable unremarkable chest, no ascites, no adenopathy, no pelvic masses. She had splenectomy by Dr Barry Dienes 02-02-15, pathology [id number]) high grade serous carcinoma involving splenic capsule and parenchyma, 9 cm. There was no visualized abdominal carcinomatosis; tail of pancreas was adherent to mass and taken with spleen      12/2014 - 05/2015 Chemotherapy     5  Cycles dense carbo taxol on 05/2015.       10/2015 Imaging    PET 10/2015  IMPRESSION: 1. No definite recurrent metastatic disease in the abdomen or pelvis. 2. Solitary mildly enlarged right paracaval lymph node is stable in size since 07/14/2015 PET-CT, where it appeared non hypermetabolic, suggesting a benign node. Recommend continued attention to this node on follow-up CT or PET-CT.       12/18/2016 Imaging    CT Chest/Abd/Pelvis IMPRESSION: 1. Status post hysterectomy and bilateral salpingo-oophorectomy and splenectomy. 2. No CT findings to suggest metastatic disease involving the chest, abdomen and pelvis. 3. Stable 4 mm right lower lobe pulmonary nodule. No new pulmonary lesions.      03/23/2017 Miscellaneous    CA 125 26.5        Interval Hx:  no changes in bowel or bladder habits. No bloating or changes in appetite   ROS:  No change in bowel or bladder habits, no nausea or emesis, no vaginal bleeding, no SOB  no chest pain, reports hair loss,  otherwise 10 point ROS negative  Social Hx:  Resides with her significant other, works full time, overall happy.    Past Surgical Hx:  Past Surgical History:  Procedure Laterality Date  . ABDOMINAL HYSTERECTOMY  04/09/2012   Procedure: HYSTERECTOMY ABDOMINAL;  Surgeon: [name], MD PHD;  Location: [facility];  Service: Gynecology;  Laterality: N/A;  . AUGMENTATION MAMMAPLASTY Bilateral  2001  . BREAST ENHANCEMENT SURGERY  2000  . LAPAROTOMY  04/09/2012   Procedure: EXPLORATORY LAPAROTOMY;  Surgeon: [name], MD PHD;  Location: [facility];  Service: Gynecology;  Laterality: N/A;  EXPLORATORY LAPAROTOMY, TOTAL ABDOMINAL HYESTERCTOMY, BILATERAL SALPINGO OOPHERECTOMY  . mohs procedure to right side of base of nose       Past Medical Hx:  Past Medical History:  Diagnosis Date  . Ascites 03/2012   on CT a/p; s/p paracentesis  . Family history of ovarian cancer   . Headache   . Hyperthyroidism   . Ovarian cancer ([place]) 03/2012 dx   s/p debulk = stage III C poor diff, ov serous ca  . 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 Squamous cell carcinoma of right hand 2006   s/p excision  Genetic testing was normal and did not reveal a mutation in these genes. The genes tested were ATM, BARD1, BRCA1, BRCA2, BRIP1, CDH1, CHEK2, EPCAM, MLH1, MRE11A, MSH2, MSH6, MUTYH, NBN, NF1, PALB2, PMS2, PTEN, RAD50, RAD51C, RAD51D, SMARCA4, STK11, and TP53.   Family Hx:  Family History  Problem Relation Age of Onset  . Melanoma Mother 66       on her back; TAH/BSO in ~30 d/t bleeding  . Rheum arthritis Mother   . Macular degeneration Mother   . Skin cancer Brother 56       squamous cell  . Thyroid cancer Brother 57       s/p thyroidectomy  . Ovarian cancer Maternal Aunt 68       "abd ca" possible ovarian; deceased 80s   PHYSICAL EXAMINATION Vitals BP 128/68 (BP Location: Left Arm, Patient Position: Sitting)   Pulse 72   Temp 98 F (36.7 C) (Oral)   Resp 18   Ht _0  (1.702 m)   Wt 178 lb 14.4 oz (81.1 kg)   SpO2 99%   BMI 28.02 kg/m   Wt Readings from Last 3 Encounters:  06/19/17 178 lb 14.4 oz (81.1 kg)  03/15/17 178 lb (80.7 kg)  12/21/16 175 lb 4.8 oz (79.5 kg)   Neck  Supple without any enlargements.  Cardiovascular  Pulse normal rate, regularity and rhythm.  Lungs  Clear to auscultation bilaterally  Psychiatry  Alert and oriented appropriate  mood and affect.  Abdomen  Normoactive bowel sounds, abdomen soft, non-tende. Surgical  sites intact without evidence of hernia or tenderness, no ascites or masses Genito Urinary  Nl EGBUS, no pelvic or cul de sac masses,  RECTAL: Good tone, no masses no cul de sac nodularity.  Extremities  No bilateral cyanosis, clubbing or edema.   Back:  No CVAT LN:  NO cervical supraclavicular or inguinal adenopathy

## 2017-06-20 ENCOUNTER — Telehealth: Payer: Self-pay

## 2017-06-20 LAB — CA 125: CANCER ANTIGEN (CA) 125: 22.2 U/mL (ref 0.0–38.1)

## 2017-06-20 NOTE — Telephone Encounter (Signed)
LM stating that her CA-125 from 06-19-17 is stable at 22.2 per Joylene John, NP.  She can call the office at [phone] if she has any questions or concerns.

## 2017-06-28 ENCOUNTER — Encounter (HOSPITAL_COMMUNITY): Payer: Self-pay | Admitting: Gynecologic Oncology

## 2017-07-18 ENCOUNTER — Other Ambulatory Visit: Payer: Self-pay | Admitting: Internal Medicine

## 2017-07-18 ENCOUNTER — Other Ambulatory Visit: Payer: Self-pay | Admitting: Hematology and Oncology

## 2017-07-18 DIAGNOSIS — Z1231 Encounter for screening mammogram for malignant neoplasm of breast: Secondary | ICD-10-CM

## 2017-08-11 NOTE — Progress Notes (Signed)
Subjective:    Patient ID: Shelley Sanchez, female    DOB: 08-07-1955, 62 y.o.   MRN: [id number]  HPI The patient is here for an acute visit.   Migraines/ headaches:  She has never been diagnosed with migraines.  She has had headaches or migraines since she was in her late 20's.  She denies aura.  She has associated fatigue, nausea, has difficulty functioning.  The headache always used to be behind her eyes, but now it is on top of her head.  Advil and aleve do not help.  One trigger she has identified is weather changes.  Scents can also trigger headaches.  She has headaches once a week to every other week. Sometimes they last a couple of days. She needs to be out of work when she has these headaches.    Her son has headaches, her other son has migraines.    Hair loss:  She is taking her thyroid medication daily.  She has been to derm and was advised to take biotin 5000 daily and she just started doing that.  Her hair is falling out all over.  She denies excessive stress.  Her energy level is good.  She is having difficulty losing weight.  She eats too much when she has nothing to do.  She exercises intermittent.     Medications and allergies reviewed with patient and updated if appropriate.  Patient Active Problem List   Diagnosis Date Noted  . Migraine headache without aura 08/13/2017  . Hair loss 08/13/2017  . 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 International Federation of Gynecology and Obstetrics (FIGO) stage IVB epithelial ovarian cancer ([place]) 03/11/2015  . Multiple pulmonary nodules 01/06/2014  . Squamous cell carcinoma of right hand   . Ovarian cancer on right ([place]) 05/21/2012  . Hypothyroid 03/07/2012    Current Outpatient Medications on File Prior to Visit  Medication Sig Dispense Refill  . Cholecalciferol (VITAMIN D PO) Take 1,000 Units by mouth daily.    Marland Kitchen ibuprofen (ADVIL,MOTRIN) 200 MG tablet Take  400 mg by mouth every 6 (six) hours as needed for headache. Reported on 07/01/2015    . levothyroxine (SYNTHROID, LEVOTHROID) 25 MCG tablet Take 1 tablet (25 mcg total) by mouth daily before breakfast. 90 tablet 0  . loratadine (CLARITIN) 10 MG tablet Take 10 mg by mouth daily. Reported on 09/30/2015    . polyethylene glycol (MIRALAX / GLYCOLAX) packet Take 17 g by mouth daily as needed. Reported on 09/30/2015     No current facility-administered medications on file prior to visit.     Past Medical History:  Diagnosis Date  . Ascites 03/2012   on CT a/p; s/p paracentesis  . Family history of ovarian cancer   . Headache   . Hyperthyroidism   . Ovarian cancer ([place]) 03/2012 dx   s/p debulk = stage III C poor diff, ov serous ca  . 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 Squamous cell carcinoma of right hand 2006   s/p excision    Past Surgical History:  Procedure Laterality Date  . ABDOMINAL HYSTERECTOMY  04/09/2012   Procedure: HYSTERECTOMY ABDOMINAL;  Surgeon: [name], MD PHD;  Location: [facility];  Service: Gynecology;  Laterality: N/A;  . AUGMENTATION MAMMAPLASTY Bilateral 2001  . BREAST ENHANCEMENT SURGERY  2000  . LAPAROTOMY  04/09/2012   Procedure: EXPLORATORY LAPAROTOMY;  Surgeon: [name], MD PHD;  Location: [facility];  Service: Gynecology;  Laterality: N/A;  EXPLORATORY LAPAROTOMY, TOTAL ABDOMINAL HYESTERCTOMY, BILATERAL SALPINGO OOPHERECTOMY  . mohs procedure to right side of base of nose       Social History   Socioeconomic History  . Marital status: Widowed    Spouse name: Not on file  . Number of children: 3  . Years of education: Not on file  . Highest education level: Not on file  Occupational History  . Not on file  Social Needs  . Financial resource strain: Not on file  . Food insecurity:    Worry: Not on file    Inability: Not on file  . Transportation needs:    Medical: Not on file    Non-medical: Not on file  Tobacco Use  .  Smoking status: Never Smoker  . Smokeless tobacco: Never Used  Substance and Sexual Activity  . Alcohol use: No    Alcohol/week: 1.0 oz    Types: 2 Standard drinks or equivalent per week  . Drug use: No  . Sexual activity: Yes  Lifestyle  . Physical activity:    Days per week: Not on file    Minutes per session: Not on file  . Stress: Not on file  Relationships  . Social connections:    Talks on phone: Not on file    Gets together: Not on file    Attends religious service: Not on file    Active member of club or organization: Not on file    Attends meetings of clubs or organizations: Not on file    Relationship status: Not on file  Other Topics Concern  . Not on file  Social History Narrative   Lives with boyfriend   Moved to 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 Melanoma Mother 87       on her back; TAH/BSO in ~30 d/t bleeding  . Rheum arthritis Mother   . Macular degeneration Mother   . Skin cancer Brother 56       squamous cell  . Thyroid cancer Brother 27       s/p thyroidectomy  . Ovarian cancer Maternal Aunt 57       "abd ca" possible ovarian; deceased 32s  . Migraines Son     Review of Systems  Constitutional: Positive for fatigue (only with migraines).  Respiratory: Positive for cough (dry and clears throat). Negative for shortness of breath and wheezing.   Cardiovascular: Negative for chest pain, palpitations and leg swelling.  Gastrointestinal: Positive for nausea (with migraines).  Skin:       Hair thinning  Neurological: Positive for headaches. Negative for light-headedness.       Objective:   Vitals:   08/13/17 0954  BP: 110/70  Pulse: 81  Resp: 16  Temp: 98.2 F (36.8 C)  SpO2: 97%   BP Readings from Last 3 Encounters:  08/13/17 110/70  06/19/17 128/68  03/15/17 106/65   Wt Readings from Last 3 Encounters:    08/13/17 176 lb (79.8 kg)  06/19/17 178 lb 14.4 oz (81.1 kg)  03/15/17 178 lb (80.7 kg)   Body mass index is 27.57 kg/m.   Physical Exam    Constitutional: Appears well-developed and well-nourished. No distress.  HENT:  Head: Normocephalic and atraumatic.  Neck: Neck supple. No tracheal deviation present. No thyromegaly present.  No cervical  lymphadenopathy Cardiovascular: Normal rate, regular rhythm and normal heart sounds.   No murmur heard. No carotid bruit .  No edema Pulmonary/Chest: Effort normal and breath sounds normal. No respiratory distress. No has no wheezes. No rales.  Skin: Skin is warm and dry. Not diaphoretic.  Psychiatric: Normal mood and affect. Behavior is normal.      Assessment & Plan:    See Problem List for Assessment and Plan of chronic medical problems.

## 2017-08-13 ENCOUNTER — Ambulatory Visit
Admission: RE | Admit: 2017-08-13 | Discharge: 2017-08-13 | Disposition: A | Discharge status: Home / Self Care | Payer: BLUE CROSS/BLUE SHIELD | Source: Ambulatory Visit | Attending: Internal Medicine | Admitting: Internal Medicine

## 2017-08-13 ENCOUNTER — Ambulatory Visit (INDEPENDENT_AMBULATORY_CARE_PROVIDER_SITE_OTHER): Payer: BLUE CROSS/BLUE SHIELD | Admitting: Internal Medicine

## 2017-08-13 ENCOUNTER — Other Ambulatory Visit (INDEPENDENT_AMBULATORY_CARE_PROVIDER_SITE_OTHER): Payer: BLUE CROSS/BLUE SHIELD

## 2017-08-13 ENCOUNTER — Encounter: Payer: Self-pay | Admitting: Internal Medicine

## 2017-08-13 VITALS — BP 110/70 | HR 81 | Temp 98.2°F | Resp 16 | Wt 176.0 lb

## 2017-08-13 DIAGNOSIS — L659 Nonscarring hair loss, unspecified: Secondary | ICD-10-CM | POA: Insufficient documentation

## 2017-08-13 DIAGNOSIS — Z1231 Encounter for screening mammogram for malignant neoplasm of breast: Secondary | ICD-10-CM

## 2017-08-13 DIAGNOSIS — G43009 Migraine without aura, not intractable, without status migrainosus: Secondary | ICD-10-CM

## 2017-08-13 LAB — COMPREHENSIVE METABOLIC PANEL
ALBUMIN: 4.3 g/dL (ref 3.5–5.2)
ALT: 19 U/L (ref 0–35)
AST: 22 U/L (ref 0–37)
Alkaline Phosphatase: 65 U/L (ref 39–117)
BUN: 17 mg/dL (ref 6–23)
CHLORIDE: 103 meq/L (ref 96–112)
CO2: 29 meq/L (ref 19–32)
CREATININE: 0.78 mg/dL (ref 0.40–1.20)
Calcium: 9.7 mg/dL (ref 8.4–10.5)
GFR: 79.38 mL/min (ref >60.00)
Glucose, Bld: 101 mg/dL — ABNORMAL HIGH (ref 70–99)
Potassium: 4.7 mEq/L (ref 3.5–5.1)
SODIUM: 139 meq/L (ref 135–145)
Total Bilirubin: 0.5 mg/dL (ref 0.2–1.2)
Total Protein: 7.9 g/dL (ref 6.0–8.3)

## 2017-08-13 LAB — IRON: IRON: 106 ug/dL (ref 42–145)

## 2017-08-13 LAB — CBC WITH DIFFERENTIAL/PLATELET
BASOS ABS: 0.1 10*3/uL (ref 0.0–0.1)
Basophils Relative: 0.9 % (ref 0.0–3.0)
Eosinophils Absolute: 0.3 10*3/uL (ref 0.0–0.7)
Eosinophils Relative: 4.1 % (ref 0.0–5.0)
HCT: 41.7 % (ref 36.0–46.0)
HEMOGLOBIN: 14.1 g/dL (ref 12.0–15.0)
LYMPHS ABS: 1.8 10*3/uL (ref 0.7–4.0)
Lymphocytes Relative: 28.5 % (ref 12.0–46.0)
MCHC: 33.9 g/dL (ref 30.0–36.0)
MCV: 95.7 fl (ref 78.0–100.0)
MONOS PCT: 11.7 % (ref 3.0–12.0)
Monocytes Absolute: 0.8 10*3/uL (ref 0.1–1.0)
NEUTROS PCT: 54.8 % (ref 43.0–77.0)
Neutro Abs: 3.6 10*3/uL (ref 1.4–7.7)
Platelets: 317 10*3/uL (ref 150.0–400.0)
RBC: 4.36 Mil/uL (ref 3.87–5.11)
RDW: 13.9 % (ref 11.5–15.5)
WBC: 6.5 10*3/uL (ref 4.0–10.5)

## 2017-08-13 LAB — FERRITIN: FERRITIN: 213.2 ng/mL (ref 10.0–291.0)

## 2017-08-13 LAB — IBC PANEL
IRON: 106 ug/dL (ref 42–145)
SATURATION RATIOS: 34.3 % (ref 20.0–50.0)
TRANSFERRIN: 221 mg/dL (ref 212.0–360.0)

## 2017-08-13 LAB — TSH: TSH: 3.56 u[IU]/mL (ref 0.35–4.50)

## 2017-08-13 MED ORDER — RIZATRIPTAN BENZOATE 5 MG PO TABS: 5.0000 mg | ORAL_TABLET | ORAL | 5 refills | Status: DC | PRN

## 2017-08-13 NOTE — Patient Instructions (Addendum)
Test(s) ordered today. Your results will be released to [facility] (or called to you) after review, usually within 72hours after test completion. If any changes need to be made, you will be notified at that same time.   Medications reviewed and updated.  Changes include maxalt for migraines.   Your prescription(s) have been submitted to your pharmacy. Please take as directed and contact our office if you believe you are having problem(s) with the medication(s).   Please followup in 2 months    Migraine Headache A migraine headache is an intense, throbbing pain on one side or both sides of the head. Migraines may also cause other symptoms, such as nausea, vomiting, and sensitivity to light and noise. What are the causes? Doing or taking certain things may also trigger migraines, such as:  Alcohol.  Smoking.  Medicines, such as: ? Medicine used to treat chest pain (nitroglycerine). ? Birth control pills. ? Estrogen pills. ? Certain blood pressure medicines.  Aged cheeses, chocolate, or caffeine.  Foods or drinks that contain nitrates, glutamate, aspartame, or tyramine.  Physical activity.  Other things that may trigger a migraine include:  Menstruation.  Pregnancy.  Hunger.  Stress, lack of sleep, too much sleep, or fatigue.  Weather changes.  What increases the risk? The following factors may make you more likely to experience migraine headaches:  Age. Risk increases with age.  Family history of migraine headaches.  Being Caucasian.  Depression and anxiety.  Obesity.  Being a woman.  Having a hole in the heart (patent foramen ovale) or other heart problems.  What are the signs or symptoms? The main symptom of this condition is pulsating or throbbing pain. Pain may:  Happen in any area of the head, such as on one side or both sides.  Interfere with daily activities.  Get worse with physical activity.  Get worse with exposure to bright lights or loud  noises.  Other symptoms may include:  Nausea.  Vomiting.  Dizziness.  General sensitivity to bright lights, loud noises, or smells.  Before you get a migraine, you may get warning signs that a migraine is developing (aura). An aura may include:  Seeing flashing lights or having blind spots.  Seeing bright spots, halos, or zigzag lines.  Having tunnel vision or blurred vision.  Having numbness or a tingling feeling.  Having trouble talking.  Having muscle weakness.  How is this diagnosed? A migraine headache can be diagnosed based on:  Your symptoms.  A physical exam.  Tests, such as CT scan or MRI of the head. These imaging tests can help rule out other causes of headaches.  Taking fluid from the spine (lumbar puncture) and analyzing it (cerebrospinal fluid analysis, or CSF analysis).  How is this treated? A migraine headache is usually treated with medicines that:  Relieve pain.  Relieve nausea.  Prevent migraines from coming back.  Treatment may also include:  Acupuncture.  Lifestyle changes like avoiding foods that trigger migraines.  Follow these instructions at home: Medicines  Take over-the-counter and prescription medicines only as told by your health care provider.  Do not drive or use heavy machinery while taking prescription pain medicine.  To prevent or treat constipation while you are taking prescription pain medicine, your health care provider may recommend that you: ? Drink enough fluid to keep your urine clear or pale yellow. ? Take over-the-counter or prescription medicines. ? Eat foods that are high in fiber, such as fresh fruits and vegetables, whole grains, and beans. ?  Limit foods that are high in fat and processed sugars, such as fried and sweet foods. Lifestyle  Avoid alcohol use.  Do not use any products that contain nicotine or tobacco, such as cigarettes and e-cigarettes. If you need help quitting, ask your health care  provider.  Get at least 8 hours of sleep every night.  Limit your stress. General instructions   Keep a journal to find out what may trigger your migraine headaches. For example, write down: ? What you eat and drink. ? How much sleep you get. ? Any change to your diet or medicines.  If you have a migraine: ? Avoid things that make your symptoms worse, such as bright lights. ? It may help to lie down in a dark, quiet room. ? Do not drive or use heavy machinery. ? Ask your health care provider what activities are safe for you while you are experiencing symptoms.  Keep all follow-up visits as told by your health care provider. This is important. Contact a health care provider if:  You develop symptoms that are different or more severe than your usual migraine symptoms. Get help right away if:  Your migraine becomes severe.  You have a fever.  You have a stiff neck.  You have vision loss.  Your muscles feel weak or like you cannot control them.  You start to lose your balance often.  You develop trouble walking.  You faint. This information is not intended to replace advice given to you by your health care provider. Make sure you discuss any questions you have with your health care provider. Document Released: 03/13/2005 Document Revised: 10/01/2015 Document Reviewed: 08/30/2015 Elsevier Interactive Patient Education  2017 [employer].

## 2017-08-13 NOTE — Assessment & Plan Note (Signed)
Check tsh - has started biotin which may affect thyroid med, but her hair loss started prior to this Will check cmp, cbc, iron panel Has already seen derm

## 2017-08-13 NOTE — Assessment & Plan Note (Signed)
Has had migraines for 30 + years - never officially diagnosed Two sons with migraines otc meds not effective Will try maxalt - discussed possible side effects Will try to avoid preventative medication at this time Discussed possible triggers and trying to identify them -- will try keeping a headache log F/u in 2 months sooner if needed

## 2017-08-15 ENCOUNTER — Encounter: Payer: Self-pay | Admitting: Internal Medicine

## 2017-09-13 ENCOUNTER — Inpatient Hospital Stay: Payer: BLUE CROSS/BLUE SHIELD

## 2017-09-13 ENCOUNTER — Encounter: Payer: Self-pay | Admitting: Internal Medicine

## 2017-09-13 ENCOUNTER — Inpatient Hospital Stay: Payer: BLUE CROSS/BLUE SHIELD | Attending: Gynecologic Oncology | Admitting: Gynecologic Oncology

## 2017-09-13 ENCOUNTER — Encounter: Payer: Self-pay | Admitting: Gynecologic Oncology

## 2017-09-13 VITALS — BP 125/58 | HR 96 | Temp 98.3°F | Resp 20 | Ht 66.5 in | Wt 173.1 lb

## 2017-09-13 DIAGNOSIS — Z90710 Acquired absence of both cervix and uterus: Secondary | ICD-10-CM | POA: Diagnosis not present

## 2017-09-13 DIAGNOSIS — Z9221 Personal history of antineoplastic chemotherapy: Secondary | ICD-10-CM | POA: Diagnosis not present

## 2017-09-13 DIAGNOSIS — Z90722 Acquired absence of ovaries, bilateral: Secondary | ICD-10-CM | POA: Diagnosis not present

## 2017-09-13 DIAGNOSIS — C561 Malignant neoplasm of right ovary: Secondary | ICD-10-CM | POA: Diagnosis not present

## 2017-09-13 NOTE — Progress Notes (Signed)
Office Visit Note: Gyn-Onc  Shelley Sanchez 62 y.o. female  CC:  Ovarian cancer  surveillance  Assessment/Plan:  62 y.o. who underwent optimal debulking on 04/09/2012. 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 She completed six cycles dose dense.  TaxolCarboplatin therapy 08/2012. 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  Treated with splenectomy taxol/carboplatin completed in March 2017  Close follow-up with CA125.   Follow-up in 3 months with GYN oncology No evidence of germline mutations.     HPI:  Oncology History   62 y.o. G3 P3 LNMP in early 77's. In October 2013 Shelley Sanchez noted fatigue, and fecal urge incontinence. Also noted increasing abdominal girth. No change in appetite, no nausea or vomiting. CT Chest/Abd/Pelvis 04/01/2012 notable for ascites so patient was admitted for evaluation. Paracentesis performed 04/01/2012. Results were negative for malignancy.  CA -125 at the time of presentation was 2418.        Ovarian cancer on right [facility])   04/08/2012 Initial Diagnosis    Ovarian cancer on right      04/09/2012 Surgery    exploratory laparotomy total abdominal hysterectomy bilateral salpingo-oophorectomy omentectomy radical debulking to optimal disease on 04/09/2012.  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 The only gross disease present was a 7 mm lesion in the posterior most aspect of the liver.   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 The genes tested were ATM, BARD1, BRCA1, BRCA2, BRIP1, CDH1, CHEK2, EPCAM, MLH1, MRE11A, MSH2, MSH6, MUTYH, NBN, NF1, PALB2, PMS2, PTEN, RAD50, RAD51C, RAD51D, SMARCA4, STK11, and TP5      11/2014  Relapse/Recurrence    Rising CA 125 noted in 2016. CT CAP 01-11-15 found mass at anterior aspect of spleen 11.5 x 8.2 x 13 cm, with stable unremarkable chest, no ascites, no adenopathy, no pelvic masses. She had splenectomy by Dr Barry Dienes 02-02-15, pathology [id number]) high grade serous carcinoma involving splenic capsule and parenchyma, 9 cm. There was no visualized abdominal carcinomatosis; tail of pancreas was adherent to mass and taken with spleen      12/2014 - 05/2015 Chemotherapy     5  Cycles dense carbo taxol on 05/2015.       10/2015 Imaging    PET 10/2015  IMPRESSION: 1. No definite recurrent metastatic disease in the abdomen or pelvis. 2. Solitary mildly enlarged right paracaval lymph node is stable in size since 07/14/2015 PET-CT, where it appeared non hypermetabolic, suggesting a benign node. Recommend continued attention to this node on follow-up CT or PET-CT.       12/18/2016 Imaging    CT Chest/Abd/Pelvis IMPRESSION: 1. Status post hysterectomy and bilateral salpingo-oophorectomy and splenectomy. 2. No CT findings to suggest metastatic disease involving the chest, abdomen and pelvis. 3. Stable 4 mm right lower lobe pulmonary nodule. No new pulmonary lesions.      03/23/2017 Miscellaneous    02/2017 CA 125 26.5 05/2017  CA 125  22.2        Interval Hx:  no changes in bowel or bladder habits. No bloating or changes in appetite.   ROS:  No change in bowel or bladder habits, no nausea or emesis, no vaginal bleeding, no SOB  no chest pain,  otherwise 10 point ROS negative  Social Hx:  Resides with her significant other, works full time, overall happy.  Has spent time in the sun recently   Past Surgical Hx:  Past Surgical History:  Procedure Laterality Date  . ABDOMINAL HYSTERECTOMY  04/09/2012   Procedure: HYSTERECTOMY ABDOMINAL;  Surgeon: [name], MD PHD;  Location: [facility];  Service: Gynecology;  Laterality: N/A;  . AUGMENTATION MAMMAPLASTY Bilateral  2001  . BREAST ENHANCEMENT SURGERY  2000  . LAPAROTOMY  04/09/2012   Procedure: EXPLORATORY LAPAROTOMY;  Surgeon: [name], MD PHD;  Location: [facility];  Service: Gynecology;  Laterality: N/A;  EXPLORATORY LAPAROTOMY, TOTAL ABDOMINAL HYESTERCTOMY, BILATERAL SALPINGO OOPHERECTOMY  . mohs procedure to right side of base of nose       Past Medical Hx:  Past Medical History:  Diagnosis Date  . Ascites 03/2012   on CT a/p; s/p paracentesis  . Family history of ovarian cancer   . Headache   . Hyperthyroidism   . Ovarian cancer ([place]) 03/2012 dx   s/p debulk = stage III C poor diff, ov serous ca  . 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 Squamous cell carcinoma of right hand 2006   s/p excision  Genetic testing was normal and did not reveal a mutation in these genes. The genes tested were ATM, BARD1, BRCA1, BRCA2, BRIP1, CDH1, CHEK2, EPCAM, MLH1, MRE11A, MSH2, MSH6, MUTYH, NBN, NF1, PALB2, PMS2, PTEN, RAD50, RAD51C, RAD51D, SMARCA4, STK11, and TP53.   Family Hx:  Family History  Problem Relation Age of Onset  . Melanoma Mother 45       on her back; TAH/BSO in ~30 d/t bleeding  . Rheum arthritis Mother   . Macular degeneration Mother   . Skin cancer Brother 56       squamous cell  . Thyroid cancer Brother 10       s/p thyroidectomy  . Ovarian cancer Maternal Aunt 68       "abd ca" possible ovarian; deceased 47s  . Migraines Son    PHYSICAL EXAMINATION Vitals BP (!) 125/58 (BP Location: Right Arm, Patient Position: Sitting)   Pulse 96   Temp 98.3 F (36.8 C) (Oral)   Resp 20   Ht 5' 6.5" (1.689 m)   Wt 173 lb 1.6 oz (78.5 kg)   SpO2 99%   BMI 27.52 kg/m   Wt Readings from Last 3 Encounters:  09/13/17 173 lb 1.6 oz (78.5 kg)  08/13/17 176 lb (79.8 kg)  06/19/17 178 lb 14.4 oz (81.1 kg)  Rosacea flare appreciated on the face  Neck  Supple without any enlargements.  Cardiovascular  Pulse normal rate, regularity and rhythm.  Lungs  Clear to  auscultation bilaterally  Psychiatry  Alert and oriented appropriate mood and affect.  Abdomen  Normoactive bowel sounds, abdomen soft, non-tender. Surgical  sites intact without evidence of hernia or tenderness, no ascites or masses Genito Urinary  Nl EGBUS, no pelvic or cul de sac masses,  RECTAL: Good tone, no masses no cul de sac nodularity.  Extremities  No bilateral cyanosis, clubbing or edema.   Back:  No CVAT LN:  NO cervical supraclavicular or inguinal adenopathy

## 2017-09-13 NOTE — Patient Instructions (Signed)
We will obtain a CA 125 today and notify you of the results.  Plan to follow up in three months or sooner if needed.  Please call for any questions, concerns, or new symptoms.

## 2017-09-14 ENCOUNTER — Telehealth: Payer: Self-pay

## 2017-09-14 LAB — CA 125: Cancer Antigen (CA) 125: 23 U/mL (ref 0.0–38.1)

## 2017-09-14 NOTE — Telephone Encounter (Signed)
Outgoing call to patient per Joylene John NP regarding CA 125 results as 'stable and within normal limits'. Pt voiced understanding and no other needs per pt at this time.

## 2017-09-19 NOTE — Telephone Encounter (Signed)
Please advise if you are okay with this? And what frequencies you are fine with. Thank you.

## 2017-09-20 NOTE — Telephone Encounter (Signed)
Yes, ok with it.  Lets try 2 days a month -- (1 day each episodes twice a month)   Thanks.

## 2017-09-21 DIAGNOSIS — Z0279 Encounter for issue of other medical certificate: Secondary | ICD-10-CM

## 2017-09-21 NOTE — Telephone Encounter (Signed)
Forms have been completed & given to provider to sign.  °

## 2017-09-21 NOTE — Telephone Encounter (Signed)
Forms have been signed, faxed to [facility] @[phone], Copy sent to scan & charged for. 6/28 bo  LVM to inform patient and sent a mychart message -- Original is ready to be picked up.

## 2017-09-26 NOTE — Telephone Encounter (Signed)
Forms mailed to patient per her request through [facility].

## 2017-10-07 ENCOUNTER — Other Ambulatory Visit: Payer: Self-pay | Admitting: Internal Medicine

## 2017-10-07 NOTE — Progress Notes (Signed)
Subjective:    Patient ID: Shelley Sanchez, female    DOB: 10-17-1955, 62 y.o.   MRN: [id number]  HPI The patient is here for follow up.  Migraine headaches:  She takes maxalt as needed.  This was started two months ago.  The medication is effective.  She denies major side effects.  She has had 8 migraines in the past 2 months.      Medications and allergies reviewed with patient and updated if appropriate.  Patient Active Problem List   Diagnosis Date Noted  . Migraine headache without aura 08/13/2017  . Hair loss 08/13/2017  . 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 International Federation of Gynecology and Obstetrics (FIGO) stage IVB epithelial ovarian cancer ([place]) 03/11/2015  . Multiple pulmonary nodules 01/06/2014  . Squamous cell carcinoma of right hand   . Ovarian cancer on right ([place]) 05/21/2012  . Hypothyroid 03/07/2012    Current Outpatient Medications on File Prior to Visit  Medication Sig Dispense Refill  . Cholecalciferol (VITAMIN D PO) Take 1,000 Units by mouth daily.    Marland Kitchen ibuprofen (ADVIL,MOTRIN) 200 MG tablet Take 400 mg by mouth every 6 (six) hours as needed for headache. Reported on 07/01/2015    . levothyroxine (SYNTHROID, LEVOTHROID) 25 MCG tablet Take 1 tablet (25 mcg total) by mouth daily before breakfast. 90 tablet 0  . loratadine (CLARITIN) 10 MG tablet Take 10 mg by mouth daily. Reported on 09/30/2015    . polyethylene glycol (MIRALAX / GLYCOLAX) packet Take 17 g by mouth daily as needed. Reported on 09/30/2015     No current facility-administered medications on file prior to visit.     Past Medical History:  Diagnosis Date  . Ascites 03/2012   on CT a/p; s/p paracentesis  . Family history of ovarian cancer   . Headache   . Hyperthyroidism   . Ovarian cancer ([place]) 03/2012 dx   s/p debulk = stage III C poor diff, ov serous ca  . Retinal detachment of right eye with giant retinal tear 2010    . Squamous cell carcinoma of right hand 2006   s/p excision    Past Surgical History:  Procedure Laterality Date  . ABDOMINAL HYSTERECTOMY  04/09/2012   Procedure: HYSTERECTOMY ABDOMINAL;  Surgeon: [name], MD PHD;  Location: [facility];  Service: Gynecology;  Laterality: N/A;  . AUGMENTATION MAMMAPLASTY Bilateral 2001  . BREAST ENHANCEMENT SURGERY  2000  . LAPAROTOMY  04/09/2012   Procedure: EXPLORATORY LAPAROTOMY;  Surgeon: [name], MD PHD;  [facility]: [facility];  Service: Gynecology;  Laterality: N/A;  EXPLORATORY LAPAROTOMY, TOTAL ABDOMINAL HYESTERCTOMY, BILATERAL SALPINGO OOPHERECTOMY  . mohs procedure to right side of base of nose       Social History   Socioeconomic History  . Marital status: Widowed    Spouse name: Not on file  . Number of children: 3  . Years of education: Not on file  . Highest education level: Not on file  Occupational History  . Not on file  Social Needs  . Financial resource strain: Not on file  . Food insecurity:    Worry: Not on file    Inability: Not on file  . Transportation needs:    Medical: Not on file    Non-medical: Not on file  Tobacco Use  . Smoking status: Never Smoker  . Smokeless tobacco: Never Used  Substance and Sexual Activity  . Alcohol use: No    Alcohol/week:  1.2 oz    Types: 2 Standard drinks or equivalent per week  . Drug use: No  . Sexual activity: Yes  Lifestyle  . Physical activity:    Days per week: Not on file    Minutes per session: Not on file  . Stress: Not on file  Relationships  . Social connections:    Talks on phone: Not on file    Gets together: Not on file    Attends religious service: Not on file    Active member of club or organization: Not on file    Attends meetings of clubs or organizations: Not on file    Relationship status: Not on file  Other Topics Concern  . 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 Melanoma Mother 16       on her back; TAH/BSO in ~30 d/t bleeding  . Rheum arthritis Mother   . Macular degeneration Mother   . Skin cancer Brother 56       squamous cell  . Thyroid cancer Brother 34       s/p thyroidectomy  . Diabetes Brother   . Ovarian cancer Maternal Aunt 47       "abd ca" possible ovarian; deceased 40s  . Migraines Son     Review of Systems  Eyes: Negative for visual disturbance.  Respiratory: Negative for shortness of breath.   Cardiovascular: Negative for chest pain and palpitations.  Gastrointestinal: Negative for nausea.  Neurological: Positive for dizziness (once with medication) and headaches (migraines).       Objective:   Vitals:   10/08/17 1426  BP: (!) 94/58  Pulse: (!) 105  Resp: 16  Temp: 98.5 F (36.9 C)  SpO2: 97%   BP Readings from Last 3 Encounters:  10/08/17 (!) 94/58  09/13/17 (!) 125/58  08/13/17 110/70   Wt Readings from Last 3 Encounters:  10/08/17 177 lb (80.3 kg)  09/13/17 173 lb 1.6 oz (78.5 kg)  08/13/17 176 lb (79.8 kg)   Body mass index is 27.72 kg/m.   Physical Exam    Constitutional: Appears well-developed and well-nourished. No distress.  HENT:  Head: Normocephalic and atraumatic.  Cardiovascular: Normal rate, regular rhythm and normal heart sounds.   No murmur heard. No carotid bruit .  No edema Pulmonary/Chest: Effort normal and breath sounds normal. No respiratory distress. No has no wheezes. No rales.  Skin: Skin is warm and dry. Not diaphoretic.  Psychiatric: Normal mood and affect. Behavior is normal.      Assessment & Plan:    See Problem List for Assessment and Plan of chronic medical problems.

## 2017-10-08 ENCOUNTER — Ambulatory Visit (INDEPENDENT_AMBULATORY_CARE_PROVIDER_SITE_OTHER): Payer: BLUE CROSS/BLUE SHIELD | Admitting: Internal Medicine

## 2017-10-08 ENCOUNTER — Encounter: Payer: Self-pay | Admitting: Internal Medicine

## 2017-10-08 VITALS — BP 94/58 | HR 105 | Temp 98.5°F | Resp 16 | Ht 67.0 in | Wt 177.0 lb

## 2017-10-08 DIAGNOSIS — G43009 Migraine without aura, not intractable, without status migrainosus: Secondary | ICD-10-CM | POA: Diagnosis not present

## 2017-10-08 MED ORDER — RIZATRIPTAN BENZOATE 5 MG PO TABS: 5.0000 mg | ORAL_TABLET | ORAL | 8 refills | Status: DC | PRN

## 2017-10-08 NOTE — Patient Instructions (Signed)
  Medications reviewed and updated.  No changes recommended at this time.  Your prescription(s) have been submitted to your pharmacy. Please take as directed and contact our office if you believe you are having problem(s) with the medication(s).    Please followup in 6 months   

## 2017-10-08 NOTE — Assessment & Plan Note (Signed)
Taking maxalt as prescribed - it is effective Has had about 8 migraines in the past 2 months Will continue maxalt at current dose --- discussed options in case maxalt stops working

## 2017-12-13 ENCOUNTER — Encounter: Payer: Self-pay | Admitting: Gynecologic Oncology

## 2017-12-13 ENCOUNTER — Inpatient Hospital Stay: Payer: BLUE CROSS/BLUE SHIELD | Attending: Gynecologic Oncology | Admitting: Gynecologic Oncology

## 2017-12-13 ENCOUNTER — Inpatient Hospital Stay: Payer: BLUE CROSS/BLUE SHIELD

## 2017-12-13 VITALS — BP 127/68 | HR 72 | Temp 97.7°F | Resp 18 | Ht 68.0 in | Wt 179.0 lb

## 2017-12-13 DIAGNOSIS — Z90710 Acquired absence of both cervix and uterus: Secondary | ICD-10-CM | POA: Diagnosis not present

## 2017-12-13 DIAGNOSIS — C569 Malignant neoplasm of unspecified ovary: Secondary | ICD-10-CM | POA: Insufficient documentation

## 2017-12-13 DIAGNOSIS — C561 Malignant neoplasm of right ovary: Secondary | ICD-10-CM

## 2017-12-13 DIAGNOSIS — Z9221 Personal history of antineoplastic chemotherapy: Secondary | ICD-10-CM | POA: Diagnosis not present

## 2017-12-13 DIAGNOSIS — Z90722 Acquired absence of ovaries, bilateral: Secondary | ICD-10-CM | POA: Diagnosis not present

## 2017-12-13 NOTE — Progress Notes (Signed)
Office Visit Note: Gyn-Onc  Shelley Sanchez 62 y.o. female  CC:  Ovarian cancer  surveillance  Assessment/Plan:  62 y.o. who underwent optimal debulking on 04/09/2012. 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 She completed six cycles dose dense.  TaxolCarboplatin therapy 08/2012. 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  Treated with splenectomy taxol/carboplatin completed in March 2017 No evidence of disease  Close follow-up with CA125.   Follow-up in 3 months with GYN oncology until March 2020 and then every 6 months. No evidence of germline mutations.     HPI:  Oncology History   62 y.o. G3 P3 LNMP in early 63's. In October 2013 Shelley Sanchez noted fatigue, and fecal urge incontinence. Also noted increasing abdominal girth. No change in appetite, no nausea or vomiting. CT Chest/Abd/Pelvis 04/01/2012 notable for ascites so patient was admitted for evaluation. Paracentesis performed 04/01/2012. Results were negative for malignancy.  CA -125 at the time of presentation was 2418.        Ovarian cancer on right [facility])   04/08/2012 Initial Diagnosis    Ovarian cancer on right    04/09/2012 Surgery    exploratory laparotomy total abdominal hysterectomy bilateral salpingo-oophorectomy omentectomy radical debulking to optimal disease on 04/09/2012.  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 The only gross disease present was a 7 mm lesion in the posterior most aspect of the liver.   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 The genes tested were ATM, BARD1, BRCA1, BRCA2, BRIP1, CDH1, CHEK2, EPCAM, MLH1, MRE11A, MSH2, MSH6, MUTYH, NBN, NF1, PALB2, PMS2, PTEN, RAD50,  RAD51C, RAD51D, SMARCA4, STK11, and TP5    11/2014 Relapse/Recurrence    Rising CA 125 noted in 2016. CT CAP 01-11-15 found mass at anterior aspect of spleen 11.5 x 8.2 x 13 cm, with stable unremarkable chest, no ascites, no adenopathy, no pelvic masses. She had splenectomy by Dr Barry Dienes 02-02-15, pathology [id number]) high grade serous carcinoma involving splenic capsule and parenchyma, 9 cm. There was no visualized abdominal carcinomatosis; tail of pancreas was adherent to mass and taken with spleen    12/2014 - 05/2015 Chemotherapy     5  Cycles dense carbo taxol on 05/2015.     10/2015 Imaging    PET 10/2015  IMPRESSION: 1. No definite recurrent metastatic disease in the abdomen or pelvis. 2. Solitary mildly enlarged right paracaval lymph node is stable in size since 07/14/2015 PET-CT, where it appeared non hypermetabolic, suggesting a benign node. Recommend continued attention to this node on follow-up CT or PET-CT.     12/18/2016 Imaging    CT Chest/Abd/Pelvis IMPRESSION: 1. Status post hysterectomy and bilateral salpingo-oophorectomy and splenectomy. 2. No CT findings to suggest metastatic disease involving the chest, abdomen and pelvis. 3. Stable 4 mm right lower lobe pulmonary nodule. No new pulmonary lesions.    03/23/2017 Miscellaneous    02/2017 CA 125 26.5 05/2017  CA 125  22.2 08/2017  CA 125 23    11/2017 Imaging    CT C/A/P IMPRESSION: 1. Status post hysterectomy and bilateral salpingo-oophorectomy and splenectomy. 2. No CT findings to suggest metastatic disease involving the chest, abdomen and pelvis. 3. Stable 4 mm right lower lobe pulmonary  nodule. No new pulmonary lesions.      Interval Hx:  no changes in bowel or bladder habits. No bloating or changes in appetite.   ROS:  No change in bowel or bladder habits, no nausea or emesis, no vaginal bleeding, no SOB no chest pain,  otherwise 10 point ROS negative  Social Hx:  Resides with her significant other,  works full time, overall happy.  Has spent time in the sun recently   Past Surgical Hx:  Past Surgical History:  Procedure Laterality Date  . ABDOMINAL HYSTERECTOMY  04/09/2012   Procedure: HYSTERECTOMY ABDOMINAL;  Surgeon: [name], MD PHD;  [facility];  Service: Gynecology;  Laterality: N/A;  . AUGMENTATION MAMMAPLASTY Bilateral 2001  . BREAST ENHANCEMENT SURGERY  2000  . LAPAROTOMY  04/09/2012   Procedure: EXPLORATORY LAPAROTOMY;  Surgeon: [name], MD PHD;  [facility];  Service: Gynecology;  Laterality: N/A;  EXPLORATORY LAPAROTOMY, TOTAL ABDOMINAL HYESTERCTOMY, BILATERAL SALPINGO OOPHERECTOMY  . mohs procedure to right side of base of nose       Past Medical Hx:  Past Medical History:  Diagnosis Date  . Ascites 03/2012   on CT a/p; s/p paracentesis  . Family history of ovarian cancer   . Headache   . Hyperthyroidism   . Ovarian cancer ([place]) 03/2012 dx   s/p debulk = stage III C poor diff, ov serous ca  . 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 Squamous cell carcinoma of right hand 2006   s/p excision  Genetic testing was normal and did not reveal a mutation in these genes. The genes tested were ATM, BARD1, BRCA1, BRCA2, BRIP1, CDH1, CHEK2, EPCAM, MLH1, MRE11A, MSH2, MSH6, MUTYH, NBN, NF1, PALB2, PMS2, PTEN, RAD50, RAD51C, RAD51D, SMARCA4, STK11, and TP53.   Family Hx:  Family History  Problem Relation Age of Onset  . Melanoma Mother 54       on her back; TAH/BSO in ~30 d/t bleeding  . Rheum arthritis Mother   . Macular degeneration Mother   . Skin cancer Brother 56       squamous cell  . Thyroid cancer Brother 110       s/p thyroidectomy  . Diabetes Brother   . Ovarian cancer Maternal Aunt 77       "abd ca" possible ovarian; deceased 44s  . Migraines Son    PHYSICAL EXAMINATION Vitals BP 127/68 (BP Location: Left Arm, Patient Position: Sitting)   Pulse 72   Temp 97.7 F (36.5 C) (Oral)   Resp 18   Ht '5\' 8"'  (1.727 m)    Wt 179 lb (81.2 kg)   SpO2 99%   BMI 27.22 kg/m   Wt Readings from Last 3 Encounters:  10/08/17 177 lb (80.3 kg)  09/13/17 173 lb 1.6 oz (78.5 kg)  08/13/17 176 lb (79.8 kg)  Rosacea  on the face  Neck  Supple without any enlargements.  Cardiovascular  Pulse normal rate, regularity and rhythm.  Lungs  Clear to auscultation bilaterally  Psychiatry  Alert and oriented appropriate mood and affect.  Abdomen  Normoactive bowel sounds, abdomen soft, non-tender. Surgical  sites intact without evidence of hernia or tenderness, no ascites or masses, no masses, no fluid wave Genito Urinary  Nl EGBUS, no pelvic or cul de sac masses,  RECTAL: Good tone, no masses no cul de sac nodularity.  Extremities  No bilateral cyanosis, clubbing or edema.   Back:  No CVAT LN:  No cervical  supraclavicular or inguinal adenopathy

## 2017-12-13 NOTE — Patient Instructions (Signed)
You will have a lab appointment today and they will collect a CA 125- we will call you with results.  You will have your next appt in 3 months and a lab appointment prior for a CA 125 so we can discuss results.

## 2017-12-14 ENCOUNTER — Telehealth: Payer: Self-pay | Admitting: *Deleted

## 2017-12-14 LAB — CA 125: CANCER ANTIGEN (CA) 125: 18.6 U/mL (ref 0.0–38.1)

## 2017-12-14 NOTE — Telephone Encounter (Signed)
Called to tell Shelley Sanchez her results for her CA 125 , but the patient did not answer the phone, I left a message for the patient to call our office back.

## 2017-12-17 NOTE — Telephone Encounter (Signed)
Told Shelley Sanchez that her CA-125 from 12-13-17 was WNL at 55.6.

## 2018-01-14 ENCOUNTER — Encounter: Payer: Self-pay | Admitting: Internal Medicine

## 2018-01-17 ENCOUNTER — Ambulatory Visit (INDEPENDENT_AMBULATORY_CARE_PROVIDER_SITE_OTHER): Payer: BLUE CROSS/BLUE SHIELD

## 2018-01-17 DIAGNOSIS — Z23 Encounter for immunization: Secondary | ICD-10-CM | POA: Diagnosis not present

## 2018-01-17 DIAGNOSIS — Z299 Encounter for prophylactic measures, unspecified: Secondary | ICD-10-CM

## 2018-02-18 ENCOUNTER — Encounter: Payer: Self-pay | Admitting: Internal Medicine

## 2018-02-27 DIAGNOSIS — Z0279 Encounter for issue of other medical certificate: Secondary | ICD-10-CM

## 2018-02-27 NOTE — Telephone Encounter (Signed)
Forms have been completed & signed, faxed to [facility] @ [phone], Copy sent to scan &charged for.   LVM to inform patient. Original is ready to picked up or I can mail it to the patient if she would like.

## 2018-02-27 NOTE — Telephone Encounter (Signed)
Patient came by the office to drop off a renewal of FMLA. She did states her employer would not expect the letter for the extra day she took in November. She would have to add that to her FMLA. But patient wanted to know if we could add that day in general to her renewal.   Which would be 3 days a month. Please advise. Thank you.

## 2018-03-12 ENCOUNTER — Inpatient Hospital Stay: Payer: BLUE CROSS/BLUE SHIELD | Attending: Gynecologic Oncology

## 2018-03-12 DIAGNOSIS — C569 Malignant neoplasm of unspecified ovary: Secondary | ICD-10-CM | POA: Diagnosis not present

## 2018-03-12 DIAGNOSIS — C561 Malignant neoplasm of right ovary: Secondary | ICD-10-CM

## 2018-03-13 LAB — CA 125: CANCER ANTIGEN (CA) 125: 20.4 U/mL (ref 0.0–38.1)

## 2018-03-13 NOTE — Progress Notes (Deleted)
Office Visit Note: Gyn-Onc  Shelley Sanchez 62 y.o. female  CC:  Ovarian cancer  surveillance  Assessment/Plan:  62 y.o. who underwent optimal debulking on 04/09/2012. 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 She completed six cycles dose dense.  TaxolCarboplatin therapy 08/2012. 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  Treated with splenectomy taxol/carboplatin completed in March 2017 No evidence of disease  Close follow-up with CA125.   Follow-up in 3 months with GYN oncology until March 2020 and then every 6 months. No evidence of germline mutations.     HPI:  Oncology History   62 y.o. G3 P3 LNMP in early 50's. In October 2013 Shelley Sanchez noted fatigue, and fecal urge incontinence. Also noted increasing abdominal girth. No change in appetite, no nausea or vomiting. CT Chest/Abd/Pelvis 04/01/2012 notable for ascites so patient was admitted for evaluation. Paracentesis performed 04/01/2012. Results were negative for malignancy.  CA -125 at the time of presentation was 2418.        Ovarian cancer on right (HCC)   04/08/2012 Initial Diagnosis    Ovarian cancer on right    04/09/2012 Surgery    exploratory laparotomy total abdominal hysterectomy bilateral salpingo-oophorectomy omentectomy radical debulking to optimal disease on 04/09/2012.  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 The only gross disease present was a 7 mm lesion in the posterior most aspect of the liver.   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 The genes tested were ATM, BARD1, BRCA1, BRCA2, BRIP1, CDH1, CHEK2, EPCAM, MLH1, MRE11A, MSH2, MSH6, MUTYH, NBN, NF1, PALB2, PMS2, PTEN, RAD50,  RAD51C, RAD51D, SMARCA4, STK11, and TP5    11/2014 Relapse/Recurrence    Rising CA 125 noted in 2016. CT CAP 01-11-15 found mass at anterior aspect of spleen 11.5 x 8.2 x 13 cm, with stable unremarkable chest, no ascites, no adenopathy, no pelvic masses. She had splenectomy by Shelley Sanchez 02-02-15, pathology ([id number]) high grade serous carcinoma involving splenic capsule and parenchyma, 9 cm. There was no visualized abdominal carcinomatosis; tail of pancreas was adherent to mass and taken with spleen    12/2014 - 05/2015 Chemotherapy     5  Cycles dense carbo taxol on 05/2015.     10/2015 Imaging    PET 10/2015  IMPRESSION: 1. No definite recurrent metastatic disease in the abdomen or pelvis. 2. Solitary mildly enlarged right paracaval lymph node is stable in size since 07/14/2015 PET-CT, where it appeared non hypermetabolic, suggesting a benign node. Recommend continued attention to this node on follow-up CT or PET-CT.     12/18/2016 Imaging    CT Chest/Abd/Pelvis IMPRESSION: 1. Status post hysterectomy and bilateral salpingo-oophorectomy and splenectomy. 2. No CT findings to suggest metastatic disease involving the chest, abdomen and pelvis. 3. Stable 4 mm right lower lobe pulmonary nodule. No new pulmonary lesions.    03/23/2017 Miscellaneous    02/2017 CA 125 26.5 05/2017  CA 125  22.2 08/2017  CA 125 23      Interval Hx:  no changes in bowel or bladder habits. No bloating or changes in appetite.   ROS:  No change in bowel or bladder habits, no nausea or emesis, no vaginal bleeding, no SOB no   chest pain,  otherwise 10 point ROS negative  Social Hx:  Resides with her significant other, works full time, overall happy.  Has spent time in the sun recently   Past Surgical Hx:  Past Surgical History:  Procedure Laterality Date  . ABDOMINAL HYSTERECTOMY  04/09/2012   Procedure: HYSTERECTOMY ABDOMINAL;  Surgeon: Shelley Mclear, MD PHD;  Location: [facility];  Service: Gynecology;   Laterality: N/A;  . AUGMENTATION MAMMAPLASTY Bilateral 2001  . BREAST ENHANCEMENT SURGERY  2000  . LAPAROTOMY  04/09/2012   Procedure: EXPLORATORY LAPAROTOMY;  Surgeon: Shelley Givhan, MD PHD;  Location: [facility];  Service: Gynecology;  Laterality: N/A;  EXPLORATORY LAPAROTOMY, TOTAL ABDOMINAL HYESTERCTOMY, BILATERAL SALPINGO OOPHERECTOMY  . mohs procedure to right side of base of nose       Past Medical Hx:  Past Medical History:  Diagnosis Date  . Ascites 03/2012   on CT a/p; s/p paracentesis  . Family history of ovarian cancer   . Headache   . Hyperthyroidism   . Ovarian cancer (HCC) 03/2012 dx   s/p debulk = stage III C poor diff, ov serous ca  . 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 Squamous cell carcinoma of right hand 2006   s/p excision  Genetic testing was normal and did not reveal a mutation in these genes. The genes tested were ATM, BARD1, BRCA1, BRCA2, BRIP1, CDH1, CHEK2, EPCAM, MLH1, MRE11A, MSH2, MSH6, MUTYH, NBN, NF1, PALB2, PMS2, PTEN, RAD50, RAD51C, RAD51D, SMARCA4, STK11, and TP53.   Family Hx:  Family History  Problem Relation Age of Onset  . Melanoma Mother 70       on her back; TAH/BSO in ~30 d/t bleeding  . Rheum arthritis Mother   . Macular degeneration Mother   . Skin cancer Brother 56       squamous cell  . Thyroid cancer Brother 58       s/p thyroidectomy  . Diabetes Brother   . Ovarian cancer Maternal Aunt 50       "abd ca" possible ovarian; deceased 50s  . Migraines Son    PHYSICAL EXAMINATION Vitals There were no vitals taken for this visit.  Wt Readings from Last 3 Encounters:  12/13/17 179 lb (81.2 kg)  10/08/17 177 lb (80.3 kg)  09/13/17 173 lb 1.6 oz (78.5 kg)  Rosacea  on the face  Neck  Supple without any enlargements.  Cardiovascular  Pulse normal rate, regularity and rhythm.  Lungs  Clear to auscultation bilaterally  Psychiatry  Alert and oriented appropriate mood and affect.  Abdomen  Normoactive bowel  sounds, abdomen soft, non-tender. Surgical  sites intact without evidence of hernia or tenderness, no ascites or masses, no masses, no fluid wave Genito Urinary  Nl EGBUS, no pelvic or cul de sac masses,  RECTAL: Good tone, no masses no cul de sac nodularity.  Extremities  No bilateral cyanosis, clubbing or edema.   Back:  No CVAT LN:  No cervical supraclavicular or inguinal adenopathy   

## 2018-03-14 ENCOUNTER — Inpatient Hospital Stay: Payer: BLUE CROSS/BLUE SHIELD | Admitting: Gynecologic Oncology

## 2018-03-14 ENCOUNTER — Telehealth: Payer: Self-pay | Admitting: *Deleted

## 2018-03-14 NOTE — Telephone Encounter (Signed)
Called and moved the patient's appt from today to January

## 2018-03-18 ENCOUNTER — Telehealth: Payer: Self-pay

## 2018-03-18 NOTE — Telephone Encounter (Signed)
Outgoing call to pt per Shelley John NP regarding CA 125 results as "20.4, still stable"  -Pt voiced understanding. No other needs per pt at this time.

## 2018-04-03 NOTE — Progress Notes (Deleted)
Office Visit Note: Gyn-Onc  Nkenge Gaffin 63 y.o. female  CC:  Ovarian cancer  surveillance  Assessment/Plan:  63 y.o. who underwent optimal debulking on 04/09/2012. 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 She completed six cycles dose dense.  TaxolCarboplatin therapy 08/2012. 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  Treated with splenectomy taxol/carboplatin completed in March 2017 No evidence of disease  Close follow-up with CA125.   Follow-up in 3 months with GYN oncology until March 2020 and then every 6 months. No evidence of germline mutations.     HPI:  Oncology History   63 y.o. G3 P3 LNMP in early 50's. In October 2013 Tasnim Portman noted fatigue, and fecal urge incontinence. Also noted increasing abdominal girth. No change in appetite, no nausea or vomiting. CT Chest/Abd/Pelvis 04/01/2012 notable for ascites so patient was admitted for evaluation. Paracentesis performed 04/01/2012. Results were negative for malignancy.  CA -125 at the time of presentation was 2418.        Ovarian cancer on right (HCC)   04/08/2012 Initial Diagnosis    Ovarian cancer on right    04/09/2012 Surgery    exploratory laparotomy total abdominal hysterectomy bilateral salpingo-oophorectomy omentectomy radical debulking to optimal disease on 04/09/2012.  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 The only gross disease present was a 7 mm lesion in the posterior most aspect of the liver.   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 The genes tested were ATM, BARD1, BRCA1, BRCA2, BRIP1, CDH1, CHEK2, EPCAM, MLH1, MRE11A, MSH2, MSH6, MUTYH, NBN, NF1, PALB2, PMS2, PTEN, RAD50,  RAD51C, RAD51D, SMARCA4, STK11, and TP5    11/2014 Relapse/Recurrence    Rising CA 125 noted in 2016. CT CAP 01-11-15 found mass at anterior aspect of spleen 11.5 x 8.2 x 13 cm, with stable unremarkable chest, no ascites, no adenopathy, no pelvic masses. She had splenectomy by Dr Byerly 02-02-15, pathology ([id number]) high grade serous carcinoma involving splenic capsule and parenchyma, 9 cm. There was no visualized abdominal carcinomatosis; tail of pancreas was adherent to mass and taken with spleen    12/2014 - 05/2015 Chemotherapy     5  Cycles dense carbo taxol on 05/2015.     10/2015 Imaging    PET 10/2015  IMPRESSION: 1. No definite recurrent metastatic disease in the abdomen or pelvis. 2. Solitary mildly enlarged right paracaval lymph node is stable in size since 07/14/2015 PET-CT, where it appeared non hypermetabolic, suggesting a benign node. Recommend continued attention to this node on follow-up CT or PET-CT.     12/18/2016 Imaging    CT Chest/Abd/Pelvis IMPRESSION: 1. Status post hysterectomy and bilateral salpingo-oophorectomy and splenectomy. 2. No CT findings to suggest metastatic disease involving the chest, abdomen and pelvis. 3. Stable 4 mm right lower lobe pulmonary nodule. No new pulmonary lesions.    03/23/2017 Miscellaneous    02/2017 CA 125 26.5 05/2017  CA 125  22.2 08/2017  CA 125 23      Interval Hx:  no changes in bowel or bladder habits. No bloating or changes in appetite.   ROS:  No change in bowel or bladder habits, no nausea or emesis, no vaginal bleeding, no SOB no   chest pain,  otherwise 10 point ROS negative  Social Hx:  Resides with her significant other, works full time, overall happy.  Has spent time in the sun recently   Past Surgical Hx:  Past Surgical History:  Procedure Laterality Date  . ABDOMINAL HYSTERECTOMY  04/09/2012   Procedure: HYSTERECTOMY ABDOMINAL;  Surgeon: Wendy Brewster, MD PHD;  Location: [facility];  Service: Gynecology;   Laterality: N/A;  . AUGMENTATION MAMMAPLASTY Bilateral 2001  . BREAST ENHANCEMENT SURGERY  2000  . LAPAROTOMY  04/09/2012   Procedure: EXPLORATORY LAPAROTOMY;  Surgeon: Wendy Brewster, MD PHD;  Location: [facility];  Service: Gynecology;  Laterality: N/A;  EXPLORATORY LAPAROTOMY, TOTAL ABDOMINAL HYESTERCTOMY, BILATERAL SALPINGO OOPHERECTOMY  . mohs procedure to right side of base of nose       Past Medical Hx:  Past Medical History:  Diagnosis Date  . Ascites 03/2012   on CT a/p; s/p paracentesis  . Family history of ovarian cancer   . Headache   . Hyperthyroidism   . Ovarian cancer (HCC) 03/2012 dx   s/p debulk = stage III C poor diff, ov serous ca  . 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 Squamous cell carcinoma of right hand 2006   s/p excision  Genetic testing was normal and did not reveal a mutation in these genes. The genes tested were ATM, BARD1, BRCA1, BRCA2, BRIP1, CDH1, CHEK2, EPCAM, MLH1, MRE11A, MSH2, MSH6, MUTYH, NBN, NF1, PALB2, PMS2, PTEN, RAD50, RAD51C, RAD51D, SMARCA4, STK11, and TP53.   Family Hx:  Family History  Problem Relation Age of Onset  . Melanoma Mother 70       on her back; TAH/BSO in ~30 d/t bleeding  . Rheum arthritis Mother   . Macular degeneration Mother   . Skin cancer Brother 56       squamous cell  . Thyroid cancer Brother 58       s/p thyroidectomy  . Diabetes Brother   . Ovarian cancer Maternal Aunt 50       "abd ca" possible ovarian; deceased 50s  . Migraines Son    PHYSICAL EXAMINATION Vitals There were no vitals taken for this visit.  Wt Readings from Last 3 Encounters:  12/13/17 179 lb (81.2 kg)  10/08/17 177 lb (80.3 kg)  09/13/17 173 lb 1.6 oz (78.5 kg)  Rosacea  on the face  Neck  Supple without any enlargements.  Cardiovascular  Pulse normal rate, regularity and rhythm.  Lungs  Clear to auscultation bilaterally  Psychiatry  Alert and oriented appropriate mood and affect.  Abdomen  Normoactive bowel  sounds, abdomen soft, non-tender. Surgical  sites intact without evidence of hernia or tenderness, no ascites or masses, no masses, no fluid wave Genito Urinary  Nl EGBUS, no pelvic or cul de sac masses,  RECTAL: Good tone, no masses no cul de sac nodularity.  Extremities  No bilateral cyanosis, clubbing or edema.   Back:  No CVAT LN:  No cervical supraclavicular or inguinal adenopathy   

## 2018-04-04 ENCOUNTER — Inpatient Hospital Stay: Payer: BLUE CROSS/BLUE SHIELD | Admitting: Gynecologic Oncology

## 2018-04-04 ENCOUNTER — Telehealth: Payer: Self-pay | Admitting: *Deleted

## 2018-04-04 NOTE — Telephone Encounter (Signed)
Patient called and rescheduled her appt from today to next week. Patient is sick with a headache

## 2018-04-06 ENCOUNTER — Other Ambulatory Visit: Payer: Self-pay | Admitting: Internal Medicine

## 2018-04-09 NOTE — Progress Notes (Signed)
Subjective:    Patient ID: Shelley Sanchez, female    DOB: [birth year], 63 y.o.   MRN: [id number]  HPI The patient is here for follow up.  Migraine headaches:  She takes maxalt as needed.    Hypothyroidism:  She is taking her medication daily.  She does feel tired, but is not getting enough sleep - she is unsure if it is her thyroid or not.    Sleep difficulties:  She works 2:30 pm - 11pm.  She often goes to bed at 3:30 am - 9: 30 am.  She is unsure if she had sleep issues prior to working these hours.  She has worked these hours for years.    She also eats at 11:30 pm and eats too much.  Eating makes herself sleepy so she can sleep.    Medications and allergies reviewed with patient and updated if appropriate.  Patient Active Problem List   Diagnosis Date Noted  . Migraine headache without aura 08/13/2017  . Hair loss 08/13/2017  . 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 [employer] Federation of Gyne[employer] and Obstetrics (FIGO) stage IVB epithelial ovarian cancer ([place]) 03/11/2015  . Multiple pulmonary nodules 01/06/2014  . Squamous cell carcinoma of right hand   . Ovarian cancer on right ([place]) 05/21/2012  . Hypothyroid 03/07/2012    Current Outpatient Medications on File Prior to Visit  Medication Sig Dispense Refill  . Cholecalciferol (VITAMIN D PO) Take 1,000 Units by mouth daily.    Marland Kitchen ibuprofen (ADVIL,MOTRIN) 200 MG tablet Take 400 mg by mouth every 6 (six) hours as needed for headache. Reported on 07/01/2015    . levothyroxine (SYNTHROID, LEVOTHROID) 25 MCG tablet TAKE 1 TABLET (25 MCG TOTAL) BY MOUTH DAILY BEFORE BREAKFAST. 90 tablet 0  . loratadine (CLARITIN) 10 MG tablet Take 10 mg by mouth daily. Reported on 09/30/2015    . polyethylene glycol (MIRALAX / GLYCOLAX) packet Take 17 g by mouth daily as needed. Reported on 09/30/2015    . rizatriptan (MAXALT) 5 MG tablet Take 1 tablet (5 mg total) by mouth as needed  for migraine. May repeat in 2 hours if needed 10 tablet 8   No current facility-administered medications on file prior to visit.     Past Medical History:  Diagnosis Date  . Ascites 03/2012   on CT a/p; s/p paracentesis  . Family history of ovarian cancer   . Headache   . Hyperthyroidism   . Ovarian cancer ([place]) 03/2012 dx   s/p debulk = stage III C poor diff, ov serous ca  . 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 Squamous cell carcinoma of right hand 2006   s/p excision    Past Surgical History:  Procedure Laterality Date  . ABDOMINAL HYSTERECTOMY  04/09/2012   Procedure: HYSTERECTOMY ABDOMINAL;  Surgeon: [name], MD PHD;  Location: [facility];  Service: Gynecology;  Laterality: N/A;  . AUGMENTATION MAMMAPLASTY Bilateral 2001  . BREAST ENHANCEMENT SURGERY  2000  . LAPAROTOMY  04/09/2012   Procedure: EXPLORATORY LAPAROTOMY;  Surgeon: [name], MD PHD;  Location: [facility];  Service: Gynecology;  Laterality: N/A;  EXPLORATORY LAPAROTOMY, TOTAL ABDOMINAL HYESTERCTOMY, BILATERAL SALPINGO OOPHERECTOMY  . mohs procedure to right side of base of nose       Social History   Socioeconomic History  . Marital status: Widowed    Spouse name: Not on file  . Number of children: 3  .  Years of education: Not on file  . Highest education level: Not on file  Occupational History  . Not on file  Social Needs  . Financial resource strain: Not on file  . Food insecurity:    Worry: Not on file    Inability: Not on file  . Transportation needs:    Medical: Not on file    Non-medical: Not on file  Tobacco Use  . Smoking status: Never Smoker  . Smokeless tobacco: Never Used  Substance and Sexual Activity  . Alcohol use: No    Alcohol/week: 2.0 standard drinks    Types: 2 Standard drinks or equivalent per week  . Drug use: No  . Sexual activity: Yes  Lifestyle  . Physical activity:    Days per week: Not on file    Minutes per session: Not on file  .  Stress: Not on file  Relationships  . Social connections:    Talks on phone: Not on file    Gets together: Not on file    Attends religious service: Not on file    Active member of club or organization: Not on file    Attends meetings of clubs or organizations: Not on file    Relationship status: Not on file  Other Topics Concern  . 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 Melanoma Mother 43       on her back; TAH/BSO in ~30 d/t bleeding  . Rheum arthritis Mother   . Macular degeneration Mother   . Skin cancer Brother 56       squamous cell  . Thyroid cancer Brother 27       s/p thyroidectomy  . Diabetes Brother   . Ovarian cancer Maternal Aunt 56       "abd ca" possible ovarian; deceased 99s  . Migraines Son     Review of Systems  Constitutional: Negative for chills and fever.  HENT: Positive for postnasal drip.   Respiratory: Positive for cough (allergy related). Negative for shortness of breath and wheezing.   Cardiovascular: Negative for chest pain, palpitations and leg swelling.  Skin:       Hair thin  Neurological: Positive for headaches. Negative for light-headedness.       Objective:   Vitals:   04/10/18 1336  BP: 134/70  Pulse: 80  Resp: 16  Temp: 98 F (36.7 C)  SpO2: 96%   BP Readings from Last 3 Encounters:  04/10/18 134/70  12/13/17 127/68  10/08/17 (!) 94/58   Wt Readings from Last 3 Encounters:  04/10/18 185 lb (83.9 kg)  12/13/17 179 lb (81.2 kg)  10/08/17 177 lb (80.3 kg)   Body mass index is 28.13 kg/m.   Physical Exam    Constitutional: Appears well-developed and well-nourished. No distress.  HENT:  Head: Normocephalic and atraumatic.  Neck: Neck supple. No tracheal deviation present. No thyromegaly present.  No cervical  lymphadenopathy Cardiovascular: Normal rate, regular rhythm and normal heart sounds.   No murmur heard. No carotid bruit .  No edema Pulmonary/Chest: Effort normal and breath sounds normal. No respiratory distress. No has no wheezes. No rales.  Skin: Skin is warm and dry. Not diaphoretic.  Psychiatric: Normal mood and affect. Behavior is normal.  Assessment & Plan:    See Problem List for Assessment and Plan of chronic medical problems.

## 2018-04-10 ENCOUNTER — Encounter: Payer: Self-pay | Admitting: Internal Medicine

## 2018-04-10 ENCOUNTER — Other Ambulatory Visit (INDEPENDENT_AMBULATORY_CARE_PROVIDER_SITE_OTHER): Payer: BLUE CROSS/BLUE SHIELD

## 2018-04-10 ENCOUNTER — Ambulatory Visit (INDEPENDENT_AMBULATORY_CARE_PROVIDER_SITE_OTHER): Payer: BLUE CROSS/BLUE SHIELD | Admitting: Internal Medicine

## 2018-04-10 ENCOUNTER — Other Ambulatory Visit: Payer: Self-pay

## 2018-04-10 VITALS — BP 134/70 | HR 80 | Temp 98.0°F | Resp 16 | Ht 68.0 in | Wt 185.0 lb

## 2018-04-10 DIAGNOSIS — R7303 Prediabetes: Secondary | ICD-10-CM | POA: Insufficient documentation

## 2018-04-10 DIAGNOSIS — E038 Other specified hypothyroidism: Secondary | ICD-10-CM

## 2018-04-10 DIAGNOSIS — G43009 Migraine without aura, not intractable, without status migrainosus: Secondary | ICD-10-CM | POA: Diagnosis not present

## 2018-04-10 DIAGNOSIS — E063 Autoimmune thyroiditis: Secondary | ICD-10-CM

## 2018-04-10 DIAGNOSIS — R739 Hyperglycemia, unspecified: Secondary | ICD-10-CM | POA: Diagnosis not present

## 2018-04-10 DIAGNOSIS — G479 Sleep disorder, unspecified: Secondary | ICD-10-CM | POA: Diagnosis not present

## 2018-04-10 LAB — COMPREHENSIVE METABOLIC PANEL
ALT: 18 U/L (ref 0–35)
AST: 18 U/L (ref 0–37)
Albumin: 4.3 g/dL (ref 3.5–5.2)
Alkaline Phosphatase: 67 U/L (ref 39–117)
BILIRUBIN TOTAL: 0.4 mg/dL (ref 0.2–1.2)
BUN: 18 mg/dL (ref 6–23)
CHLORIDE: 102 meq/L (ref 96–112)
CO2: 29 meq/L (ref 19–32)
Calcium: 9.8 mg/dL (ref 8.4–10.5)
Creatinine, Ser: 0.93 mg/dL (ref 0.40–1.20)
GFR: 64.66 mL/min (ref >60.00)
GLUCOSE: 96 mg/dL (ref 70–99)
Potassium: 4.3 mEq/L (ref 3.5–5.1)
Sodium: 138 mEq/L (ref 135–145)
Total Protein: 7.8 g/dL (ref 6.0–8.3)

## 2018-04-10 LAB — HEMOGLOBIN A1C: Hgb A1c MFr Bld: 5.8 % (ref 4.6–6.5)

## 2018-04-10 LAB — TSH: TSH: 3.48 u[IU]/mL (ref 0.35–4.50)

## 2018-04-10 NOTE — Patient Instructions (Addendum)
  Tests ordered today. Your results will be released to [facility] (or called to you) after review, usually within 72hours after test completion. If any changes need to be made, you will be notified at that same time.   Medications reviewed and updated.  Changes include :   none    Please followup in one year for a physical

## 2018-04-10 NOTE — Assessment & Plan Note (Signed)
a1c

## 2018-04-10 NOTE — Assessment & Plan Note (Signed)
She takes maxalt as needed Often needs 2 doses  - will try taking 10 mg initially to see if that is more effective and let me know

## 2018-04-10 NOTE — Assessment & Plan Note (Signed)
She feels tired Check tsh  Titrate med dose if needed

## 2018-04-10 NOTE — Assessment & Plan Note (Addendum)
She works 2:30 - 11 pm and often does not get to bed until 3:30 or 4 - she may sleep until 9:30 She does feel tired Often she eats at night and that makes her sleepy but she is eating too much and needs to stop She wonders about a sleep med - discussed other ways of relaxing - avoid eating  Try to relax as soon as she gets home and try to get to sleep earlier -   She will try this and we both feel avoiding medication is best

## 2018-04-11 ENCOUNTER — Encounter: Payer: Self-pay | Admitting: Gynecologic Oncology

## 2018-04-11 ENCOUNTER — Inpatient Hospital Stay: Payer: BLUE CROSS/BLUE SHIELD | Attending: Gynecologic Oncology | Admitting: Gynecologic Oncology

## 2018-04-11 VITALS — BP 115/88 | HR 82 | Temp 98.9°F | Resp 20 | Ht 67.0 in | Wt 184.9 lb

## 2018-04-11 DIAGNOSIS — C569 Malignant neoplasm of unspecified ovary: Secondary | ICD-10-CM | POA: Insufficient documentation

## 2018-04-11 DIAGNOSIS — Z90722 Acquired absence of ovaries, bilateral: Secondary | ICD-10-CM | POA: Diagnosis not present

## 2018-04-11 DIAGNOSIS — C561 Malignant neoplasm of right ovary: Secondary | ICD-10-CM

## 2018-04-11 DIAGNOSIS — Z90710 Acquired absence of both cervix and uterus: Secondary | ICD-10-CM | POA: Diagnosis not present

## 2018-04-11 DIAGNOSIS — Z9221 Personal history of antineoplastic chemotherapy: Secondary | ICD-10-CM | POA: Insufficient documentation

## 2018-04-11 NOTE — Patient Instructions (Signed)
Please call our office in May or June of 2020 to schedule appointment for July 2020.

## 2018-04-11 NOTE — Progress Notes (Signed)
Office Visit Note: Gyn-Onc  Shelley Sanchez 63 y.o. female  CC:  Ovarian cancer  surveillance  Assessment/Plan:  63 y.o. who underwent optimal debulking on 04/09/2012. 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 She completed six cycles dose dense.  TaxolCarboplatin therapy 08/2012. 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  Treated with splenectomy taxol/carboplatin completed in March 2017 No evidence of disease  Close follow-up with CA125.   Follow-up in 6 months with GYN oncology     HPI:  Oncology History   63 y.o. G3 P3 LNMP in early 53's. In October 2013 Shelley Sanchez noted fatigue, and fecal urge incontinence. Also noted increasing abdominal girth. No change in appetite, no nausea or vomiting. CT Chest/Abd/Pelvis 04/01/2012 notable for ascites so patient was admitted for evaluation. Paracentesis performed 04/01/2012. Results were negative for malignancy.  CA -125 at the time of presentation was 2418.        Ovarian cancer on right [facility])   04/08/2012 Initial Diagnosis    Ovarian cancer on right    04/09/2012 Surgery    exploratory laparotomy total abdominal hysterectomy bilateral salpingo-oophorectomy omentectomy radical debulking to optimal disease on 04/09/2012.  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 The only gross disease present was a 7 mm lesion in the posterior most aspect of the liver.   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 The genes tested were ATM, BARD1, BRCA1, BRCA2, BRIP1, CDH1, CHEK2, EPCAM, MLH1, MRE11A, MSH2, MSH6, MUTYH, NBN, NF1, PALB2, PMS2, PTEN, RAD50, RAD51C, RAD51D, SMARCA4, STK11, and TP5  Somatic BRCA1 evaluation  NO loss of  expression    11/2014 Relapse/Recurrence    Rising CA 125 noted in 2016. CT CAP 01-11-15 found mass at anterior aspect of spleen 11.5 x 8.2 x 13 cm, with stable unremarkable chest, no ascites, no adenopathy, no pelvic masses. She had splenectomy by Dr Barry Dienes 02-02-15, pathology [id number]) high grade serous carcinoma involving splenic capsule and parenchyma, 9 cm. There was no visualized abdominal carcinomatosis; tail of pancreas was adherent to mass and taken with spleen    12/2014 - 05/2015 Chemotherapy     5  Cycles dense carbo taxol on 05/2015.     10/2015 Imaging    PET 10/2015  IMPRESSION: 1. No definite recurrent metastatic disease in the abdomen or pelvis. 2. Solitary mildly enlarged right paracaval lymph node is stable in size since 07/14/2015 PET-CT, where it appeared non hypermetabolic, suggesting a benign node. Recommend continued attention to this node on follow-up CT or PET-CT.     12/18/2016 Imaging    CT Chest/Abd/Pelvis IMPRESSION: 1. Status post hysterectomy and bilateral salpingo-oophorectomy and splenectomy. 2. No CT findings to suggest metastatic disease involving the chest, abdomen and pelvis. 3. Stable 4 mm right lower lobe pulmonary nodule. No new pulmonary lesions.    03/23/2017 Miscellaneous    02/2017 CA 125     26.5 05/2017  CA 125      22.2 08/2017  CA 125      23 02/2018 CA 125     20.4      Interval Hx:  no changes in bowel or bladder habits. No bloating or changes in appetite.   ROS:  No change in bowel or bladder habits, no nausea or emesis, no vaginal bleeding, no SOB no chest pain,  otherwise 10 point ROS negative  Social Hx:  Resides with her significant other, works full time, overall happy.  Has spent time in the sun recently   Past Surgical Hx:  Past Surgical History:  Procedure Laterality Date  . ABDOMINAL HYSTERECTOMY  04/09/2012   Procedure: HYSTERECTOMY ABDOMINAL;  Surgeon: [name], MD PHD;  Location: [facility];  Service:  Gynecology;  Laterality: N/A;  . AUGMENTATION MAMMAPLASTY Bilateral 2001  . BREAST ENHANCEMENT SURGERY  2000  . LAPAROTOMY  04/09/2012   Procedure: EXPLORATORY LAPAROTOMY;  Surgeon: [name], MD PHD;  Location: [facility];  Service: Gynecology;  Laterality: N/A;  EXPLORATORY LAPAROTOMY, TOTAL ABDOMINAL HYESTERCTOMY, BILATERAL SALPINGO OOPHERECTOMY  . mohs procedure to right side of base of nose       Past Medical Hx:  Past Medical History:  Diagnosis Date  . Ascites 03/2012   on CT a/p; s/p paracentesis  . Family history of ovarian cancer   . Headache   . Hyperthyroidism   . Ovarian cancer ([place]) 03/2012 dx   s/p debulk = stage III C poor diff, ov serous ca  . 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 Squamous cell carcinoma of right hand 2006   s/p excision    Family Hx:  Family History  Problem Relation Age of Onset  . Melanoma Mother 38       on her back; TAH/BSO in ~30 d/t bleeding  . Rheum arthritis Mother   . Macular degeneration Mother   . Skin cancer Brother 56       squamous cell  . Thyroid cancer Brother 26       s/p thyroidectomy  . Diabetes Brother   . Ovarian cancer Maternal Aunt 63       "abd ca" possible ovarian; deceased 91s  . Migraines Son    PHYSICAL EXAMINATION Vitals BP 115/88 (BP Location: Left Arm, Patient Position: Sitting)   Pulse 82   Temp 98.9 F (37.2 C) (Oral)   Resp 20   Ht '5\' 7"'  (1.702 m)   Wt 184 lb 14.4 oz (83.9 kg)   SpO2 97%   BMI 28.96 kg/m   Wt Readings from Last 3 Encounters:  04/11/18 184 lb 14.4 oz (83.9 kg)  04/10/18 185 lb (83.9 kg)  12/13/17 179 lb (81.2 kg)  Rosacea  on the face  Neck  Supple without any enlargements.  Cardiovascular  Pulse normal rate, regularity and rhythm.  Lungs  Clear to auscultation bilaterally  Psychiatry  Alert and oriented appropriate mood and affect.  Abdomen  Normoactive bowel sounds, abdomen soft, non-tender. Surgical  sites intact without evidence of hernia or  tenderness, no ascites or masses, no masses, no fluid wave Genito Urinary  Nl EGBUS, no pelvic or cul de sac masses,  RECTAL: Good tone, no masses no cul de sac nodularity.  Extremities  No bilateral cyanosis, clubbing or edema.   Back:  No CVAT LN:  No cervical supraclavicular or inguinal adenopathy

## 2018-06-10 DIAGNOSIS — D3132 Benign neoplasm of left choroid: Secondary | ICD-10-CM | POA: Diagnosis not present

## 2018-06-10 DIAGNOSIS — H04123 Dry eye syndrome of bilateral lacrimal glands: Secondary | ICD-10-CM | POA: Diagnosis not present

## 2018-06-10 DIAGNOSIS — H2513 Age-related nuclear cataract, bilateral: Secondary | ICD-10-CM | POA: Diagnosis not present

## 2018-06-10 DIAGNOSIS — H5212 Myopia, left eye: Secondary | ICD-10-CM | POA: Diagnosis not present

## 2018-07-10 ENCOUNTER — Other Ambulatory Visit: Payer: Self-pay | Admitting: Internal Medicine

## 2018-08-06 ENCOUNTER — Other Ambulatory Visit: Payer: Self-pay | Admitting: Internal Medicine

## 2018-08-13 DIAGNOSIS — H2513 Age-related nuclear cataract, bilateral: Secondary | ICD-10-CM | POA: Diagnosis not present

## 2018-08-13 DIAGNOSIS — H04123 Dry eye syndrome of bilateral lacrimal glands: Secondary | ICD-10-CM | POA: Diagnosis not present

## 2018-08-21 ENCOUNTER — Other Ambulatory Visit: Payer: Self-pay | Admitting: Internal Medicine

## 2018-08-21 ENCOUNTER — Encounter: Payer: Self-pay | Admitting: Internal Medicine

## 2018-08-21 DIAGNOSIS — Z1231 Encounter for screening mammogram for malignant neoplasm of breast: Secondary | ICD-10-CM

## 2018-08-21 DIAGNOSIS — Z1211 Encounter for screening for malignant neoplasm of colon: Secondary | ICD-10-CM

## 2018-08-30 ENCOUNTER — Encounter: Payer: Self-pay | Admitting: Gastroenterology

## 2018-09-04 ENCOUNTER — Encounter: Payer: Self-pay | Admitting: Internal Medicine

## 2018-09-11 ENCOUNTER — Inpatient Hospital Stay: Payer: BC Managed Care – PPO | Attending: Gynecologic Oncology

## 2018-09-11 ENCOUNTER — Other Ambulatory Visit: Payer: Self-pay

## 2018-09-11 DIAGNOSIS — C561 Malignant neoplasm of right ovary: Secondary | ICD-10-CM | POA: Insufficient documentation

## 2018-09-12 LAB — CA 125: Cancer Antigen (CA) 125: 24.9 U/mL (ref 0.0–38.1)

## 2018-09-13 ENCOUNTER — Encounter: Payer: Self-pay | Admitting: Internal Medicine

## 2018-09-17 DIAGNOSIS — Z0279 Encounter for issue of other medical certificate: Secondary | ICD-10-CM

## 2018-09-17 NOTE — Telephone Encounter (Signed)
Forms signed, Faxed to [place] @[phone], Copy sent to scan&charged for.

## 2018-10-02 ENCOUNTER — Other Ambulatory Visit: Payer: Self-pay

## 2018-10-02 ENCOUNTER — Ambulatory Visit (AMBULATORY_SURGERY_CENTER): Payer: BC Managed Care – PPO | Admitting: *Deleted

## 2018-10-02 VITALS — Ht 67.0 in | Wt 175.0 lb

## 2018-10-02 DIAGNOSIS — Z1211 Encounter for screening for malignant neoplasm of colon: Secondary | ICD-10-CM

## 2018-10-02 MED ORDER — SUPREP BOWEL PREP KIT 17.5-3.13-1.6 GM/177ML PO SOLN: 1.0000 | Freq: Once | ORAL | 0 refills | Status: AC

## 2018-10-02 NOTE — Progress Notes (Signed)
No egg or soy allergy known to patient  No issues with past sedation with any surgeries  or procedures, no intubation problems  No diet pills per patient No home 02 use per patient  No blood thinners per patient  Pt denies issues with constipation  No A fib or A flutter  EMMI video sent to pt's e mail   Pt verified name, DOB, address and insurance during PV today. Pt mailed instruction packet to included paper to complete and mail back to [facility] with addressed and stamped envelope, Emmi video, copy of consent form to read and not return, and instructions. Suprep $15  coupon mailed in packet. PV completed over the phone. Pt encouraged to call with questions or issues   Pt is aware that care partner will wait in the car during proceudre; if they feel like they will be too hot to wait in the car; they may wait in the lobby.  We want them to wear a mask (we do not have any that we can provide them), practice social distancing, and we will check their temperatures when they get here.  I did remind patient that their care partner needs to stay in the parking lot the entire time. Pt will wear mask into building.

## 2018-10-05 ENCOUNTER — Other Ambulatory Visit: Payer: Self-pay | Admitting: Internal Medicine

## 2018-10-09 ENCOUNTER — Ambulatory Visit
Admission: RE | Admit: 2018-10-09 | Discharge: 2018-10-09 | Disposition: A | Discharge status: Home / Self Care | Payer: BC Managed Care – PPO | Source: Ambulatory Visit | Attending: Internal Medicine | Admitting: Internal Medicine

## 2018-10-09 DIAGNOSIS — Z1231 Encounter for screening mammogram for malignant neoplasm of breast: Secondary | ICD-10-CM | POA: Diagnosis not present

## 2018-10-15 ENCOUNTER — Telehealth: Payer: Self-pay | Admitting: Gastroenterology

## 2018-10-15 NOTE — Telephone Encounter (Signed)

## 2018-10-16 ENCOUNTER — Encounter: Payer: Self-pay | Admitting: Gastroenterology

## 2018-10-16 ENCOUNTER — Other Ambulatory Visit: Payer: Self-pay

## 2018-10-16 ENCOUNTER — Ambulatory Visit (AMBULATORY_SURGERY_CENTER): Payer: BC Managed Care – PPO | Admitting: Gastroenterology

## 2018-10-16 VITALS — BP 108/71 | HR 63 | Temp 97.5°F | Resp 11 | Ht 67.0 in | Wt 175.0 lb

## 2018-10-16 DIAGNOSIS — D124 Benign neoplasm of descending colon: Secondary | ICD-10-CM

## 2018-10-16 DIAGNOSIS — D125 Benign neoplasm of sigmoid colon: Secondary | ICD-10-CM

## 2018-10-16 DIAGNOSIS — Z800 Family history of malignant neoplasm of digestive organs: Secondary | ICD-10-CM | POA: Diagnosis not present

## 2018-10-16 DIAGNOSIS — K635 Polyp of colon: Secondary | ICD-10-CM | POA: Diagnosis not present

## 2018-10-16 DIAGNOSIS — Z1211 Encounter for screening for malignant neoplasm of colon: Secondary | ICD-10-CM | POA: Diagnosis not present

## 2018-10-16 DIAGNOSIS — D123 Benign neoplasm of transverse colon: Secondary | ICD-10-CM

## 2018-10-16 MED ORDER — SODIUM CHLORIDE 0.9 % IV SOLN: 500.0000 mL | Freq: Once | INTRAVENOUS | Status: DC

## 2018-10-16 NOTE — Patient Instructions (Signed)
YOU HAD AN ENDOSCOPIC PROCEDURE TODAY AT [facility]:   Refer to the procedure report that was given to you for any specific questions about what was found during the examination.  If the procedure report does not answer your questions, please call your gastroenterologist to clarify.  If you requested that your care partner not be given the details of your procedure findings, then the procedure report has been included in a sealed envelope for you to review at your convenience later.  YOU SHOULD EXPECT: Some feelings of bloating in the abdomen. Passage of more gas than usual.  Walking can help get rid of the air that was put into your GI tract during the procedure and reduce the bloating. If you had a lower endoscopy (such as a colonoscopy or flexible sigmoidoscopy) you may notice spotting of blood in your stool or on the toilet paper. If you underwent a bowel prep for your procedure, you may not have a normal bowel movement for a few days.  Please Note:  You might notice some irritation and congestion in your nose or some drainage.  This is from the oxygen used during your procedure.  There is no need for concern and it should clear up in a day or so.  SYMPTOMS TO REPORT IMMEDIATELY:   Following lower endoscopy (colonoscopy or flexible sigmoidoscopy):  Excessive amounts of blood in the stool  Significant tenderness or worsening of abdominal pains  Swelling of the abdomen that is new, acute  Fever of 100F or higher  Please see handouts given to you on Polyps and Diverticulosis.  For urgent or emergent issues, a gastroenterologist can be reached at any hour by calling [phone].   DIET:  We do recommend a small meal at first, but then you may proceed to your regular diet.  Drink plenty of fluids but you should avoid alcoholic beverages for 24 hours.  ACTIVITY:  You should plan to take it easy for the rest of today and you should NOT DRIVE or use heavy machinery until  tomorrow (because of the sedation medicines used during the test).    FOLLOW UP: Our staff will call the number listed on your records 48-72 hours following your procedure to check on you and address any questions or concerns that you may have regarding the information given to you following your procedure. If we do not reach you, we will leave a message.  We will attempt to reach you two times.  During this call, we will ask if you have developed any symptoms of COVID 19. If you develop any symptoms (ie: fever, flu-like symptoms, shortness of breath, cough etc.) before then, please call [phone].  If you test positive for Covid 19 in the 2 weeks post procedure, please call and report this information to [place].    If any biopsies were taken you will be contacted by phone or by letter within the next 1-3 weeks.  Please call us at [phone] if you have not heard about the biopsies in 3 weeks.    SIGNATURES/CONFIDENTIALITY: You and/or your care partner have signed paperwork which will be entered into your electronic medical record.  These signatures attest to the fact that that the information above on your After Visit Summary has been reviewed and is understood.  Full responsibility of the confidentiality of this discharge information lies with you and/or your care-partner.   Thank you for letting us take care of your healthcare needs today.

## 2018-10-16 NOTE — Op Note (Signed)
[facility] Patient Name: Shelley Sanchez Procedure Date: 10/16/2018 8:18 AM MRN: [id number] Endoscopist: Thornton Park MD, MD Age: 63 Referring MD:  Date of Birth: Apr 03, 1955 Gender: Female Account #: [id number] Procedure:                Colonoscopy Indications:              Screening patient at increased risk: Family history                            of 1st-degree relative with colorectal cancer at                            age 57 years (or older), This is the patient's                            first colonoscopy                           Mother with colon cancer at age 27. No other known                            family history of colon cancer or polyps.                           No baseline GI symptoms. Medicines:                See the Anesthesia note for documentation of the                            administered medications Procedure:                Pre-Anesthesia Assessment:                           - Prior to the procedure, a History and Physical                            was performed, and patient medications and                            allergies were reviewed. The patient's tolerance of                            previous anesthesia was also reviewed. The risks                            and benefits of the procedure and the sedation                            options and risks were discussed with the patient.                            All questions were answered, and informed consent  was obtained. Prior Anticoagulants: The patient has                            taken no previous anticoagulant or antiplatelet                            agents. ASA Grade Assessment: II - A patient with                            mild systemic disease. After reviewing the risks                            and benefits, the patient was deemed in                            satisfactory condition to undergo the procedure.   After obtaining informed consent, the colonoscope                            was passed under direct vision. Throughout the                            procedure, the patient's blood pressure, pulse, and                            oxygen saturations were monitored continuously. The                            Colonoscope was introduced through the anus and                            advanced to the the terminal ileum, with                            identification of the appendiceal orifice and IC                            valve. A second forward view of the right colon was                            performed. The colonoscopy was performed with                            moderate difficulty due to a redundant colon,                            significant looping and a tortuous colon.                            Successful completion of the procedure was aided by                            applying abdominal pressure. The patient tolerated  the procedure well. The quality of the bowel                            preparation was excellent. The terminal ileum,                            ileocecal valve, appendiceal orifice, and rectum                            were photographed. Scope In: 8:29:02 AM Scope Out: 8:49:46 AM Scope Withdrawal Time: 0 hours 14 minutes 20 seconds  Total Procedure Duration: 0 hours 20 minutes 44 seconds  Findings:                 The perianal and digital rectal examinations were                            normal.                           A few small-mouthed diverticula were found in the                            sigmoid colon.                           A 3 mm polyp was found in the transverse colon. The                            polyp was flat. The top of the polyp was removed                            with a cold snare. It was too flat to remove the                            base with a snare. The base was then removed with                             forceps in two pieces. Resection and retrieval were                            complete. Estimated blood loss was minimal.                           A 1 mm polyp was found in the descending colon. The                            polyp was sessile. The polyp was removed with a                            cold biopsy forceps. Resection and retrieval were                            complete. Estimated  blood loss was minimal.                           A 2 mm polyp was found in the distal descending                            colon. The polyp was hyperplastic. The polyp was                            removed with a cold snare. Resection and retrieval                            were complete. Estimated blood loss was minimal.                           An 8-9 mm polyp was found in the sigmoid colon. The                            polyp was semi-pedunculated. The polyp was removed                            with a cold snare. Resection and retrieval were                            complete. Estimated blood loss was minimal.                           The exam was otherwise without abnormality on                            direct and retroflexion views. Complications:            No immediate complications. Estimated blood loss:                            Minimal. Estimated Blood Loss:     Estimated blood loss was minimal. Impression:               - Diverticulosis in the sigmoid colon.                           - One 3 mm polyp in the transverse colon, removed                            with a cold snare. Resected and retrieved.                           - One 1 mm polyp in the descending colon, removed                            with a cold biopsy forceps. Resected and retrieved.                           - One 2 mm polyp in the  distal descending colon,                            removed with a cold snare. Resected and retrieved.                           - One 8-9 mm polyp in the  sigmoid colon, removed                            with a cold snare. Resected and retrieved.                           - The examination was otherwise normal on direct                            and retroflexion views. Recommendation:           - Patient has a contact number available for                            emergencies. The signs and symptoms of potential                            delayed complications were discussed with the                            patient. Return to normal activities tomorrow.                            Written discharge instructions were provided to the                            patient.                           - High fiber diet today.                           - Continue present medications.                           - Await pathology results.                           - Repeat colonoscopy in 3 years for surveillance if                            at least 3 polyps are adenomas. Otherwise, repeat                            colonoscopy in 5 years. Thornton Park MD, MD 10/16/2018 8:59:09 AM This report has been signed electronically.

## 2018-10-16 NOTE — Progress Notes (Signed)
Called to room to assist during endoscopic procedure.  Patient ID and intended procedure confirmed with present staff. Received instructions for my participation in the procedure from the performing physician.  

## 2018-10-16 NOTE — Progress Notes (Signed)
Pt's states no medical or surgical changes since previsit or office visit.  Temp taken by CW VS taken by JB 

## 2018-10-16 NOTE — Progress Notes (Signed)
To PACU, VSS. Report to [email or web address] 

## 2018-10-18 ENCOUNTER — Telehealth: Payer: Self-pay | Admitting: *Deleted

## 2018-10-18 NOTE — Telephone Encounter (Signed)
  Follow up Call-  Call back number 10/16/2018  Post procedure Call Back phone  # [phone]  Permission to leave phone message Yes  Some recent data might be hidden     Patient questions:  Do you have a fever, pain , or abdominal swelling? No. Pain Score  0 *  Have you tolerated food without any problems? Yes.    Have you been able to return to your normal activities? Yes.    Do you have any questions about your discharge instructions: Diet   No. Medications  No. Follow up visit  No.  Do you have questions or concerns about your Care? No.  Actions: * If pain score is 4 or above: No action needed, pain <4.  1. Have you developed a fever since your procedure? NO  2.   Have you had an respiratory symptoms (SOB or cough) since your procedure? NO  3.   Have you tested positive for COVID 19 since your procedure NO  4.   Have you had any family members/close contacts diagnosed with the COVID 19 since your procedure?  NO   If yes to any of these questions please route to Joylene John, RN and Alphonsa Gin, RN.

## 2018-10-21 ENCOUNTER — Telehealth: Payer: Self-pay | Admitting: *Deleted

## 2018-10-21 NOTE — Telephone Encounter (Signed)
Called and scheduled the patient for a follow up appt on 8/20

## 2018-10-24 ENCOUNTER — Encounter: Payer: Self-pay | Admitting: Gastroenterology

## 2018-11-13 NOTE — Progress Notes (Signed)
Office Visit Note: Gyn-Onc  Shelley Sanchez 63 y.o. female  CC:  Ovarian cancer  surveillance  Assessment/Plan:  63 y.o. who underwent optimal debulking on 04/09/2012. 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 She completed six cycles dose dense.  TaxolCarboplatin therapy 08/2012. 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  Treated with splenectomy taxol/carboplatin completed in March 2017 Mild elevation in Ca1 25 from 18.6 in September 2000 19-24.9 in June 2020.  This may just be a fluctuation because on review of her labs there was a value as high as 26.5 since completion of chemotherapy  Will perform CT of chest abdomen and pelvis Repeat Ca125 Follow-up in 2 months    HPI:  Oncology History Overview Note  63 y.o. G3 P3 LNMP in early 70's. In October 2013 Shelley Sanchez noted fatigue, and fecal urge incontinence. Also noted increasing abdominal girth. No change in appetite, no nausea or vomiting. CT Chest/Abd/Pelvis 04/01/2012 notable for ascites so patient was admitted for evaluation. Paracentesis performed 04/01/2012. Results were negative for malignancy.  CA -125 at the time of presentation was 2418.      Ovarian cancer on right [facility])  04/08/2012 Initial Diagnosis   Ovarian cancer on right   04/09/2012 Surgery   exploratory laparotomy total abdominal hysterectomy bilateral salpingo-oophorectomy omentectomy radical debulking to optimal disease on 04/09/2012.  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 The only gross disease present was a 7 mm lesion in the posterior most aspect of the liver.   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 The genes tested were ATM,  BARD1, BRCA1, BRCA2, BRIP1, CDH1, CHEK2, EPCAM, MLH1, MRE11A, MSH2, MSH6, MUTYH, NBN, NF1, PALB2, PMS2, PTEN, RAD50, RAD51C, RAD51D, SMARCA4, STK11, and TP5  Somatic BRCA1 evaluation  NO loss of expression   11/2014 Relapse/Recurrence   Rising CA 125 noted in 2016. CT CAP 01-11-15 found mass at anterior aspect of spleen 11.5 x 8.2 x 13 cm, with stable unremarkable chest, no ascites, no adenopathy, no pelvic masses. She had splenectomy by Dr Barry Dienes 02-02-15, pathology [phone]) high grade serous carcinoma involving splenic capsule and parenchyma, 9 cm. There was no visualized abdominal carcinomatosis; tail of pancreas was adherent to mass and taken with spleen   12/2014 - 05/2015 Chemotherapy    5  Cycles dense carbo taxol on 05/2015.    10/2015 Imaging   PET 10/2015  IMPRESSION: 1. No definite recurrent metastatic disease in the abdomen or pelvis. 2. Solitary mildly enlarged right paracaval lymph node is stable in size since 07/14/2015 PET-CT, where it appeared non hypermetabolic, suggesting a benign node. Recommend continued attention to this node on follow-up CT or PET-CT.    12/18/2016 Imaging   CT Chest/Abd/Pelvis IMPRESSION: 1. Status post hysterectomy and bilateral salpingo-oophorectomy and splenectomy. 2. No CT findings to suggest metastatic disease involving the chest, abdomen and pelvis. 3. Stable 4 mm right lower lobe pulmonary nodule. No new pulmonary lesions.   03/23/2017 Miscellaneous   02/2017 CA 125     26.5 05/2017  CA 125      22.2 08/2017  CA 125      23 02/2018 CA 125     20.4      Interval Hx:  no changes in bowel or bladder habits. No bloating or changes in appetite.   ROS: Review of Systems  Constitutional: Negative for appetite change and unexpected weight change.  HENT:  Negative.   Eyes: Negative.   Respiratory: Negative.   Cardiovascular: Negative.   Gastrointestinal: Negative for abdominal distention, rectal pain and vomiting.  Genitourinary:  Negative.    Musculoskeletal: Negative.   Skin: Negative.   Neurological: Negative.   Psychiatric/Behavioral: Negative.    Social Hx:  Resides with her significant other, works full time, overall happy.  Has spent time in the sun recently   Past Surgical Hx:  Past Surgical History:  Procedure Laterality Date  . ABDOMINAL HYSTERECTOMY  04/09/2012   Procedure: HYSTERECTOMY ABDOMINAL;  Surgeon: Shelley Morning, MD PHD;  Location: [facility];  Service: Gynecology;  Laterality: N/A;  . BREAST ENHANCEMENT SURGERY  2000  . LAPAROTOMY  04/09/2012   Procedure: EXPLORATORY LAPAROTOMY;  Surgeon: Shelley Morning, MD PHD;  Location: [facility];  Service: Gynecology;  Laterality: N/A;  EXPLORATORY LAPAROTOMY, TOTAL ABDOMINAL HYESTERCTOMY, BILATERAL SALPINGO OOPHERECTOMY  . mohs procedure to right side of base of nose       Past Medical Hx:  Past Medical History:  Diagnosis Date  . Allergy   . Ascites 03/2012   on CT a/p; s/p paracentesis  . Blood transfusion without reported diagnosis   . Cataract    removed right eye with lens   . Family history of ovarian cancer   . Headache   . Hyperthyroidism   . Migraines   . Ovarian cancer ([place]) 03/2012 dx   s/p debulk = stage III C poor diff, ov serous ca  . 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 Squamous cell carcinoma of right hand 2006   s/p excision    Family Hx:  Family History  Problem Relation Age of Onset  . Melanoma Mother 92       on her back; TAH/BSO in ~30 d/t bleeding  . Rheum arthritis Mother   . Macular degeneration Mother   . Colon cancer Mother 52  . Skin cancer Brother 56       squamous cell  . Thyroid cancer Brother 46       s/p thyroidectomy  . Diabetes Brother   . Ovarian cancer Maternal Aunt 48       "abd ca" possible ovarian; deceased 9s  . Migraines Son   . Colon polyps Neg Hx   . Esophageal cancer Neg Hx   . Stomach cancer Neg Hx   . Rectal cancer Neg Hx    PHYSICAL EXAMINATION Vitals BP (!)  120/48 (BP Location: Left Arm, Patient Position: Sitting)   Pulse 74   Temp 98.3 F (36.8 C) (Oral)   Resp 18   Ht '5\' 7"'  (1.702 m)   Wt 189 lb 9.6 oz (86 kg)   SpO2 97%   BMI 29.70 kg/m   Wt Readings from Last 3 Encounters:  11/14/18 189 lb 9.6 oz (86 kg)  10/16/18 175 lb (79.4 kg)  10/02/18 175 lb (79.4 kg)  Rosacea  on the face  Neck  Supple without any enlargements.  Cardiovascular  Pulse normal rate, regularity and rhythm.  Lungs  Clear to auscultation bilaterally  Psychiatry  Alert and oriented appropriate mood and affect.  Abdomen  Normoactive bowel sounds, abdomen soft, non-tender. Surgical  sites intact without evidence of hernia or tenderness, no ascites or masses, no masses, no fluid wave Genito Urinary  Nl EGBUS, no pelvic or cul de sac masses,  RECTAL: Good tone, no masses no cul de sac nodularity.  Extremities  No bilateral cyanosis, clubbing or edema.   Back:  No CVAT LN:  No cervical supraclavicular or inguinal adenopathy

## 2018-11-14 ENCOUNTER — Inpatient Hospital Stay: Payer: BC Managed Care – PPO | Attending: Gynecologic Oncology | Admitting: Gynecologic Oncology

## 2018-11-14 ENCOUNTER — Other Ambulatory Visit: Payer: Self-pay

## 2018-11-14 ENCOUNTER — Inpatient Hospital Stay: Payer: BC Managed Care – PPO

## 2018-11-14 VITALS — BP 120/48 | HR 74 | Temp 98.3°F | Resp 18 | Ht 67.0 in | Wt 189.6 lb

## 2018-11-14 DIAGNOSIS — Z90722 Acquired absence of ovaries, bilateral: Secondary | ICD-10-CM | POA: Diagnosis not present

## 2018-11-14 DIAGNOSIS — C561 Malignant neoplasm of right ovary: Secondary | ICD-10-CM

## 2018-11-14 DIAGNOSIS — Z90710 Acquired absence of both cervix and uterus: Secondary | ICD-10-CM

## 2018-11-14 DIAGNOSIS — Z9221 Personal history of antineoplastic chemotherapy: Secondary | ICD-10-CM | POA: Diagnosis not present

## 2018-11-14 DIAGNOSIS — R971 Elevated cancer antigen 125 [CA 125]: Secondary | ICD-10-CM | POA: Diagnosis not present

## 2018-11-14 DIAGNOSIS — C569 Malignant neoplasm of unspecified ovary: Secondary | ICD-10-CM

## 2018-11-14 DIAGNOSIS — Z8543 Personal history of malignant neoplasm of ovary: Secondary | ICD-10-CM | POA: Insufficient documentation

## 2018-11-14 LAB — COMPREHENSIVE METABOLIC PANEL
ALT: 19 U/L (ref 0–44)
AST: 23 U/L (ref 15–41)
Albumin: 4.5 g/dL (ref 3.5–5.0)
Alkaline Phosphatase: 89 U/L (ref 38–126)
Anion gap: 11 (ref 5–15)
BUN: 13 mg/dL (ref 8–23)
CO2: 26 mmol/L (ref 22–32)
Calcium: 9.5 mg/dL (ref 8.9–10.3)
Chloride: 103 mmol/L (ref 98–111)
Creatinine, Ser: 0.88 mg/dL (ref 0.44–1.00)
GFR calc Af Amer: >60 mL/min (ref >60)
GFR calc non Af Amer: >60 mL/min (ref >60)
Glucose, Bld: 108 mg/dL — ABNORMAL HIGH (ref 70–99)
Potassium: 4.5 mmol/L (ref 3.5–5.1)
Sodium: 140 mmol/L (ref 135–145)
Total Bilirubin: 0.5 mg/dL (ref 0.3–1.2)
Total Protein: 8.5 g/dL — ABNORMAL HIGH (ref 6.5–8.1)

## 2018-11-14 NOTE — Patient Instructions (Signed)
CT chest abd and pelvis Will check CA 125 F/U in 6 months  Thank you very much Ms. Kloee Ballew Mecca for allowing me to provide care for you today.  I appreciate your confidence in choosing our Gynecologic Oncology team.  If you have any questions about your visit today please call our office and we will get back to you as soon as possible.  Please consider using the website Medlineplus.gov as an Geneticist, molecular.   Francetta Found. Sabel Hornbeck MD., PhD Gynecologic Oncology

## 2018-11-15 ENCOUNTER — Telehealth: Payer: Self-pay

## 2018-11-15 LAB — CA 125: Cancer Antigen (CA) 125: 25 U/mL (ref 0.0–38.1)

## 2018-11-15 NOTE — Telephone Encounter (Signed)
I spoke with Ms Pinkhasov this am . I told her that her ca 125 level is 25.0 and that is stable.  Pt verbalized understanding.

## 2018-11-18 ENCOUNTER — Telehealth: Payer: Self-pay | Admitting: *Deleted

## 2018-11-18 NOTE — Telephone Encounter (Signed)
Called and scheduled an app for a CT scan. Called and left the patient a message to call the office back.

## 2018-11-18 NOTE — Telephone Encounter (Signed)
Patient called back and given instructions for the scan, along with date/time

## 2018-11-25 ENCOUNTER — Other Ambulatory Visit: Payer: Self-pay

## 2018-11-25 ENCOUNTER — Telehealth: Payer: Self-pay

## 2018-11-25 ENCOUNTER — Ambulatory Visit (HOSPITAL_COMMUNITY)
Admission: RE | Admit: 2018-11-25 | Discharge: 2018-11-25 | Disposition: A | Discharge status: Home / Self Care | Payer: BC Managed Care – PPO | Source: Ambulatory Visit | Attending: Gynecologic Oncology | Admitting: Gynecologic Oncology

## 2018-11-25 DIAGNOSIS — C561 Malignant neoplasm of right ovary: Secondary | ICD-10-CM | POA: Diagnosis not present

## 2018-11-25 DIAGNOSIS — R971 Elevated cancer antigen 125 [CA 125]: Secondary | ICD-10-CM | POA: Diagnosis not present

## 2018-11-25 MED ORDER — SODIUM CHLORIDE (PF) 0.9 % IJ SOLN
INTRAMUSCULAR | Status: AC
  Filled 2018-11-25: qty 50

## 2018-11-25 MED ORDER — IOHEXOL 300 MG/ML  SOLN
100.0000 mL | Freq: Once | INTRAMUSCULAR | Status: AC | PRN
  Administered 2018-11-25: 100 mL via INTRAVENOUS

## 2018-11-25 NOTE — Telephone Encounter (Signed)
Told Ms Contino that the CT showed no evidence of recurrent or metastatic carcinoma within the chest, abdomen, or pelvis per Joylene John, NP.

## 2018-12-03 ENCOUNTER — Telehealth: Payer: Self-pay | Admitting: *Deleted

## 2018-12-03 NOTE — Telephone Encounter (Signed)
Called and set up the patient to see Dr. Skeet Latch at [facility]. Called and gave the patient the appt date/time, also gave the address and phone number

## 2018-12-10 ENCOUNTER — Encounter: Payer: Self-pay | Admitting: Internal Medicine

## 2018-12-18 ENCOUNTER — Ambulatory Visit (INDEPENDENT_AMBULATORY_CARE_PROVIDER_SITE_OTHER): Payer: BC Managed Care – PPO

## 2018-12-18 ENCOUNTER — Other Ambulatory Visit: Payer: Self-pay

## 2018-12-18 DIAGNOSIS — D225 Melanocytic nevi of trunk: Secondary | ICD-10-CM | POA: Diagnosis not present

## 2018-12-18 DIAGNOSIS — D485 Neoplasm of uncertain behavior of skin: Secondary | ICD-10-CM | POA: Diagnosis not present

## 2018-12-18 DIAGNOSIS — Z23 Encounter for immunization: Secondary | ICD-10-CM

## 2018-12-18 DIAGNOSIS — C44519 Basal cell carcinoma of skin of other part of trunk: Secondary | ICD-10-CM | POA: Diagnosis not present

## 2018-12-18 DIAGNOSIS — D1801 Hemangioma of skin and subcutaneous tissue: Secondary | ICD-10-CM | POA: Diagnosis not present

## 2018-12-18 DIAGNOSIS — L578 Other skin changes due to chronic exposure to nonionizing radiation: Secondary | ICD-10-CM | POA: Diagnosis not present

## 2018-12-18 DIAGNOSIS — L570 Actinic keratosis: Secondary | ICD-10-CM | POA: Diagnosis not present

## 2018-12-18 DIAGNOSIS — L821 Other seborrheic keratosis: Secondary | ICD-10-CM | POA: Diagnosis not present

## 2018-12-24 DIAGNOSIS — L570 Actinic keratosis: Secondary | ICD-10-CM | POA: Diagnosis not present

## 2019-01-22 DIAGNOSIS — L570 Actinic keratosis: Secondary | ICD-10-CM | POA: Diagnosis not present

## 2019-01-29 DIAGNOSIS — C44519 Basal cell carcinoma of skin of other part of trunk: Secondary | ICD-10-CM | POA: Diagnosis not present

## 2019-04-01 DIAGNOSIS — L680 Hirsutism: Secondary | ICD-10-CM | POA: Diagnosis not present

## 2019-04-01 DIAGNOSIS — C4441 Basal cell carcinoma of skin of scalp and neck: Secondary | ICD-10-CM | POA: Diagnosis not present

## 2019-04-01 DIAGNOSIS — L578 Other skin changes due to chronic exposure to nonionizing radiation: Secondary | ICD-10-CM | POA: Diagnosis not present

## 2019-04-07 ENCOUNTER — Other Ambulatory Visit: Payer: Self-pay | Admitting: Internal Medicine

## 2019-04-11 ENCOUNTER — Encounter: Payer: Self-pay | Admitting: Internal Medicine

## 2019-04-12 NOTE — Progress Notes (Signed)
Subjective:    Patient ID: Shelley Sanchez, female    DOB: 1955-11-14, 64 y.o.   MRN: [id number]  HPI The patient is here for an acute visit for abdominal pain.  Her symptoms started over 6 months ago.  She has been having a soreness in her upper abdomen region.  It was initially intermittent, but for the past 2 weeks it has been daily.  She points to the lower rib cage, especially in the left medial aspect of her lower rib cage as the area of most tenderness.  It is tender when she pushes on it.  The soreness is more noticeable with movement, bending and coughing.  There is no relation to eating.  She is not taking anything for it so is unsure if anything helps it.  The soreness does seem to be better with being still or laying down.  She works 8 hours a day and has been sitting all day.  Recently she just changed from 8 hours a day to 4 hours a day.  This was recent and she is unsure if it will help.  She is also felt and heard a gurgling in her upper abdomen.  When she hears this sometimes she will belch.  She has not had any nausea or heartburn.   She is a history of ovarian cancer.  She saw her surgeon in August and had a CT of her chest, abdomen and pelvis, which did not show any evidence of disease recurrence.  She is a follow-up next month with her surgeon again.      Medications and allergies reviewed with patient and updated if appropriate.  Patient Active Problem List   Diagnosis Date Noted  . Cancer antigen 125 (CA 125) elevation 11/14/2018  . Sleep difficulties 04/10/2018  . Hyperglycemia 04/10/2018  . Prediabetes 04/10/2018  . Migraine headache without aura 08/13/2017  . Hair loss 08/13/2017  . 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 International Federation of Gynecology and Obstetrics (FIGO) stage IVB epithelial ovarian cancer ([place]) 03/11/2015  . Multiple pulmonary nodules 01/06/2014  . Squamous cell  carcinoma of right hand   . Ovarian cancer on right ([place]) 05/21/2012  . Hypothyroid 03/07/2012    Current Outpatient Medications on File Prior to Visit  Medication Sig Dispense Refill  . Cholecalciferol (VITAMIN D PO) Take 1,000 Units by mouth daily.    Marland Kitchen ibuprofen (ADVIL,MOTRIN) 200 MG tablet Take 400 mg by mouth every 6 (six) hours as needed for headache. Reported on 07/01/2015    . levothyroxine (SYNTHROID) 25 MCG tablet Take 1 tablet (25 mcg total) by mouth daily before breakfast. **OFFICE VISIT IS DUE** 90 tablet 0  . loratadine (CLARITIN) 10 MG tablet Take 10 mg by mouth as needed. Reported on 09/30/2015    . rizatriptan (MAXALT) 5 MG tablet TAKE 1 TABLET (5 MG TOTAL) BY MOUTH AS NEEDED FOR MIGRAINE. MAY REPEAT IN 2 HOURS IF NEEDED 10 tablet 8   No current facility-administered medications on file prior to visit.    Past Medical History:  Diagnosis Date  . Allergy   . Ascites 03/2012   on CT a/p; s/p paracentesis  . Blood transfusion without reported diagnosis   . Cataract    removed right eye with lens   . Family history of ovarian cancer   . Headache   . Hyperthyroidism   . Migraines   . Ovarian cancer ([place]) 03/2012 dx   s/p debulk =  stage III C poor diff, ov serous ca  . 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 Squamous cell carcinoma of right hand 2006   s/p excision    Past Surgical History:  Procedure Laterality Date  . ABDOMINAL HYSTERECTOMY  04/09/2012   Procedure: HYSTERECTOMY ABDOMINAL;  Surgeon: [name], MD PHD;  Location: [facility];  Service: Gynecology;  Laterality: N/A;  . BREAST ENHANCEMENT SURGERY  2000  . LAPAROTOMY  04/09/2012   Procedure: EXPLORATORY LAPAROTOMY;  Surgeon: [name], MD PHD;  Location: [facility];  Service: Gynecology;  Laterality: N/A;  EXPLORATORY LAPAROTOMY, TOTAL ABDOMINAL HYESTERCTOMY, BILATERAL SALPINGO OOPHERECTOMY  . mohs procedure to right side of base of nose       Social History   Socioeconomic  History  . Marital status: Widowed    Spouse name: Not on file  . Number of children: 3  . Years of education: Not on file  . Highest education level: Not on file  Occupational History  . Not on file  Tobacco Use  . Smoking status: Never Smoker  . Smokeless tobacco: Never Used  Substance and Sexual Activity  . Alcohol use: Yes    Alcohol/week: 2.0 standard drinks    Types: 2 Standard drinks or equivalent per week    Comment: occasional to rare - beer   . Drug use: No  . Sexual activity: Yes  Other Topics Concern  . 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 Difficulty of Paying Living Expenses: Not on file  Food Insecurity:   . Worried About Charity fundraiser in the Last Year: Not on file  . Ran Out of Food in the Last Year: Not on file  Transportation Needs:   . Lack of Transportation (Medical): Not on file  . Lack of Transportation (Non-Medical): Not on file  Physical Activity:   . Days of Exercise per Week: Not on file  . Minutes of Exercise per Session: Not on file  Stress:   . Feeling of Stress : Not on file  Social Connections:   . Frequency of Communication with Friends and Family: Not on file  . Frequency of Social Gatherings with Friends and Family: Not on file  . Attends Religious Services: Not on file  . Active Member of Clubs or Organizations: Not on file  . Attends Archivist Meetings: Not on file  . Marital Status: Not on file    Family History  Problem Relation Age of Onset  . Melanoma Mother 61       on her back; TAH/BSO in ~30 d/t bleeding  . Rheum arthritis Mother   . Macular degeneration Mother   . Colon cancer Mother 81  . Skin cancer Brother 56       squamous cell  . Thyroid cancer Brother 60       s/p thyroidectomy  . Diabetes  Brother   . Ovarian cancer Maternal Aunt 26       "abd ca" possible ovarian; deceased 54s  . Migraines Son   . Colon polyps Neg Hx   . Esophageal cancer Neg Hx   . Stomach cancer Neg Hx   . Rectal cancer Neg Hx  Review of Systems  Constitutional: Negative for appetite change, chills and fever.  Gastrointestinal: Positive for abdominal pain. Negative for blood in stool, constipation, diarrhea and nausea.       Gurgling intermittently, increased belching.  No GERD  Genitourinary: Negative for dysuria and frequency.       Objective:   Vitals:   04/14/19 1055  BP: 122/80  Pulse: 75  Resp: 16  Temp: 98 F (36.7 C)  SpO2: 97%   BP Readings from Last 3 Encounters:  04/14/19 122/80  11/14/18 (!) 120/48  10/16/18 108/71   Wt Readings from Last 3 Encounters:  04/14/19 191 lb (86.6 kg)  11/14/18 189 lb 9.6 oz (86 kg)  10/16/18 175 lb (79.4 kg)   Body mass index is 29.91 kg/m.   Physical Exam    Constitutional: Appears well-developed and well-nourished. No distress.  Head: Normocephalic and atraumatic.  Cardiovascular: Normal rate, regular rhythm and normal heart sounds.  No murmur heard. No carotid bruit .  No edema Pulmonary/Chest: Xiphoid process and left lower medial rib tender to palpation.  Effort normal and breath sounds normal. No respiratory distress. No has no wheezes. No rales. Abdomen: Soft, nontender, nondistended Skin: Skin is warm and dry. Not diaphoretic.  Psychiatric: Normal mood and affect. Behavior is normal.       Assessment & Plan:    See Problem List for Assessment and Plan of chronic medical problems.    This visit occurred during the SARS-CoV-2 public health emergency.  Safety protocols were in place, including screening questions prior to the visit, additional usage of staff PPE, and extensive cleaning of exam room while observing appropriate contact time as indicated for disinfecting solutions.

## 2019-04-14 ENCOUNTER — Other Ambulatory Visit: Payer: Self-pay

## 2019-04-14 ENCOUNTER — Ambulatory Visit (INDEPENDENT_AMBULATORY_CARE_PROVIDER_SITE_OTHER): Payer: BC Managed Care – PPO | Admitting: Internal Medicine

## 2019-04-14 ENCOUNTER — Encounter: Payer: Self-pay | Admitting: Internal Medicine

## 2019-04-14 VITALS — BP 122/80 | HR 75 | Temp 98.0°F | Resp 16 | Ht 67.0 in | Wt 191.0 lb

## 2019-04-14 DIAGNOSIS — K219 Gastro-esophageal reflux disease without esophagitis: Secondary | ICD-10-CM

## 2019-04-14 DIAGNOSIS — R0781 Pleurodynia: Secondary | ICD-10-CM

## 2019-04-14 DIAGNOSIS — R0789 Other chest pain: Secondary | ICD-10-CM | POA: Insufficient documentation

## 2019-04-14 MED ORDER — MELOXICAM 15 MG PO TABS: 15.0000 mg | ORAL_TABLET | Freq: Every day | ORAL | 0 refills | Status: DC

## 2019-04-14 MED ORDER — FAMOTIDINE 40 MG PO TABS: 40.0000 mg | ORAL_TABLET | Freq: Every day | ORAL | 1 refills | Status: DC

## 2019-04-14 NOTE — Patient Instructions (Addendum)
Take pepcid 40 mg daily for 2 weeks and then stop.  This should help the gurgling and belching.     Take meloxicam daily to 2 weeks - take with food.  This will hopefully help with the inflammation in your rib cage.    Have an xray today or tomorrow at [facility].    Update with your symptoms in a week or two.

## 2019-04-14 NOTE — Assessment & Plan Note (Signed)
She has been experiencing increased gurgling and belching, but no nausea or reflux Likely atypical GERD Start Pepcid 40 mg daily for 2 weeks and then stop medication Discussed food, NSAIDs, stress they can increase the risk of GERD-advised lifestyle She will update me in the next week or 2 regarding her symptoms

## 2019-04-14 NOTE — Assessment & Plan Note (Signed)
Soreness seems to be more musculoskeletal in nature-worse with movement, coughing and bending Likely costochondritis CT of her chest, abdomen and pelvis in August 2020 showed no concerning findings-history of ovarian cancer with splenectomy Will obtain x-ray of bilateral ribs Trial of meloxicam-we will only use short-term given possible GERD.  Advised to take with food.  Do not take any other NSAIDs while taking She will update me in a week or 2 with her symptoms so we can decide if further evaluation is necessary

## 2019-04-15 ENCOUNTER — Ambulatory Visit (INDEPENDENT_AMBULATORY_CARE_PROVIDER_SITE_OTHER)
Admission: RE | Admit: 2019-04-15 | Discharge: 2019-04-15 | Disposition: A | Discharge status: Home / Self Care | Payer: BC Managed Care – PPO | Source: Ambulatory Visit | Attending: Internal Medicine | Admitting: Internal Medicine

## 2019-04-15 ENCOUNTER — Encounter: Payer: Self-pay | Admitting: Internal Medicine

## 2019-04-15 DIAGNOSIS — R0781 Pleurodynia: Secondary | ICD-10-CM | POA: Diagnosis not present

## 2019-04-17 DIAGNOSIS — L570 Actinic keratosis: Secondary | ICD-10-CM | POA: Diagnosis not present

## 2019-05-06 ENCOUNTER — Other Ambulatory Visit: Payer: Self-pay | Admitting: Internal Medicine

## 2019-05-09 ENCOUNTER — Other Ambulatory Visit: Payer: Self-pay | Admitting: Gynecologic Oncology

## 2019-05-09 DIAGNOSIS — C561 Malignant neoplasm of right ovary: Secondary | ICD-10-CM

## 2019-05-09 DIAGNOSIS — Z9221 Personal history of antineoplastic chemotherapy: Secondary | ICD-10-CM | POA: Diagnosis not present

## 2019-05-09 DIAGNOSIS — Z808 Family history of malignant neoplasm of other organs or systems: Secondary | ICD-10-CM | POA: Diagnosis not present

## 2019-05-09 DIAGNOSIS — R599 Enlarged lymph nodes, unspecified: Secondary | ICD-10-CM | POA: Diagnosis not present

## 2019-05-09 DIAGNOSIS — K769 Liver disease, unspecified: Secondary | ICD-10-CM | POA: Diagnosis not present

## 2019-05-09 DIAGNOSIS — C569 Malignant neoplasm of unspecified ovary: Secondary | ICD-10-CM | POA: Diagnosis not present

## 2019-05-09 DIAGNOSIS — Z90721 Acquired absence of ovaries, unilateral: Secondary | ICD-10-CM | POA: Diagnosis not present

## 2019-05-09 DIAGNOSIS — Z8041 Family history of malignant neoplasm of ovary: Secondary | ICD-10-CM | POA: Diagnosis not present

## 2019-05-09 DIAGNOSIS — Z800 Family history of malignant neoplasm of digestive organs: Secondary | ICD-10-CM | POA: Diagnosis not present

## 2019-05-09 DIAGNOSIS — Z90710 Acquired absence of both cervix and uterus: Secondary | ICD-10-CM | POA: Diagnosis not present

## 2019-05-12 ENCOUNTER — Encounter: Payer: Self-pay | Admitting: Internal Medicine

## 2019-05-13 ENCOUNTER — Other Ambulatory Visit: Payer: Self-pay

## 2019-05-13 ENCOUNTER — Inpatient Hospital Stay: Payer: BC Managed Care – PPO | Attending: Gynecologic Oncology

## 2019-05-13 ENCOUNTER — Encounter: Payer: Self-pay | Admitting: Gynecologic Oncology

## 2019-05-13 DIAGNOSIS — Z90710 Acquired absence of both cervix and uterus: Secondary | ICD-10-CM | POA: Diagnosis not present

## 2019-05-13 DIAGNOSIS — R971 Elevated cancer antigen 125 [CA 125]: Secondary | ICD-10-CM | POA: Insufficient documentation

## 2019-05-13 DIAGNOSIS — Z9221 Personal history of antineoplastic chemotherapy: Secondary | ICD-10-CM | POA: Insufficient documentation

## 2019-05-13 DIAGNOSIS — C561 Malignant neoplasm of right ovary: Secondary | ICD-10-CM | POA: Diagnosis not present

## 2019-05-13 DIAGNOSIS — Z90722 Acquired absence of ovaries, bilateral: Secondary | ICD-10-CM | POA: Insufficient documentation

## 2019-05-13 LAB — BASIC METABOLIC PANEL
Anion gap: 8 (ref 5–15)
BUN: 15 mg/dL (ref 8–23)
CO2: 27 mmol/L (ref 22–32)
Calcium: 9.1 mg/dL (ref 8.9–10.3)
Chloride: 105 mmol/L (ref 98–111)
Creatinine, Ser: 0.85 mg/dL (ref 0.44–1.00)
GFR calc Af Amer: >60 mL/min (ref >60)
GFR calc non Af Amer: >60 mL/min (ref >60)
Glucose, Bld: 105 mg/dL — ABNORMAL HIGH (ref 70–99)
Potassium: 4.3 mmol/L (ref 3.5–5.1)
Sodium: 140 mmol/L (ref 135–145)

## 2019-05-14 LAB — CA 125: Cancer Antigen (CA) 125: 21.6 U/mL (ref 0.0–38.1)

## 2019-05-19 ENCOUNTER — Other Ambulatory Visit: Payer: Self-pay

## 2019-05-19 ENCOUNTER — Ambulatory Visit (HOSPITAL_COMMUNITY)
Admission: RE | Admit: 2019-05-19 | Discharge: 2019-05-19 | Disposition: A | Discharge status: Home / Self Care | Payer: BC Managed Care – PPO | Source: Ambulatory Visit | Attending: Gynecologic Oncology | Admitting: Gynecologic Oncology

## 2019-05-19 DIAGNOSIS — C561 Malignant neoplasm of right ovary: Secondary | ICD-10-CM | POA: Diagnosis not present

## 2019-05-19 MED ORDER — SODIUM CHLORIDE (PF) 0.9 % IJ SOLN
INTRAMUSCULAR | Status: AC
  Filled 2019-05-19: qty 50

## 2019-05-19 MED ORDER — IOHEXOL 300 MG/ML  SOLN
100.0000 mL | Freq: Once | INTRAMUSCULAR | Status: AC | PRN
  Administered 2019-05-19: 15:00:00 100 mL via INTRAVENOUS

## 2019-05-20 ENCOUNTER — Telehealth: Payer: Self-pay

## 2019-05-20 DIAGNOSIS — L570 Actinic keratosis: Secondary | ICD-10-CM | POA: Diagnosis not present

## 2019-05-20 NOTE — Telephone Encounter (Signed)
Told Shelley Sanchez that her CA-125 was good at 21.6 and that her CT scan showed no change in the lymph nodes and no evidence of recurrence. Will send Dr. Skeet Latch a copy of her results via e-mail. Pt verbalized understanding.

## 2019-06-10 ENCOUNTER — Encounter: Payer: Self-pay | Admitting: Internal Medicine

## 2019-06-13 ENCOUNTER — Ambulatory Visit: Payer: BC Managed Care – PPO | Attending: Internal Medicine

## 2019-06-13 DIAGNOSIS — Z23 Encounter for immunization: Secondary | ICD-10-CM

## 2019-06-13 NOTE — Progress Notes (Signed)
   Covid-19 Vaccination Clinic  Name:  Shelley Sanchez    MRN: [id number] DOB: March 04, 1956  06/13/2019  [name] was observed post Covid-19 immunization for 15 minutes without incident. She was provided with Vaccine Information Sheet and instruction to access the V-Safe system.   [name] was instructed to call 911 with any severe reactions post vaccine: Marland Kitchen Difficulty breathing  . Swelling of face and throat  . A fast heartbeat  . A bad rash all over body  . Dizziness and weakness   Immunizations Administered    Name Date Dose VIS Date Route   Pfizer COVID-19 Vaccine 06/13/2019  1:28 PM 0.3 mL 03/07/2019 Intramuscular   Manufacturer: [place]   Lot: [id number]   [place]: [id number]

## 2019-07-04 ENCOUNTER — Other Ambulatory Visit: Payer: Self-pay | Admitting: Internal Medicine

## 2019-07-08 ENCOUNTER — Ambulatory Visit: Payer: BC Managed Care – PPO | Attending: Internal Medicine

## 2019-07-08 ENCOUNTER — Ambulatory Visit: Payer: BC Managed Care – PPO

## 2019-07-08 DIAGNOSIS — Z23 Encounter for immunization: Secondary | ICD-10-CM

## 2019-07-08 NOTE — Progress Notes (Signed)
   Covid-19 Vaccination Clinic  Name:  Shelley Sanchez    MRN: [id number] DOB: Sep 06, 1955  07/08/2019  [name] was observed post Covid-19 immunization for 15 minutes without incident. She was provided with Vaccine Information Sheet and instruction to access the V-Safe system.   [name] was instructed to call 911 with any severe reactions post vaccine: Marland Kitchen Difficulty breathing  . Swelling of face and throat  . A fast heartbeat  . A bad rash all over body  . Dizziness and weakness   Immunizations Administered    Name Date Dose VIS Date Route   Pfizer COVID-19 Vaccine 07/08/2019 12:34 PM 0.3 mL 03/07/2019 Intramuscular   Manufacturer: [place]   Lot: [id number]   [place]: [id number]

## 2019-08-14 DIAGNOSIS — H524 Presbyopia: Secondary | ICD-10-CM | POA: Diagnosis not present

## 2019-08-14 DIAGNOSIS — H25012 Cortical age-related cataract, left eye: Secondary | ICD-10-CM | POA: Diagnosis not present

## 2019-10-03 ENCOUNTER — Other Ambulatory Visit: Payer: Self-pay | Admitting: Internal Medicine

## 2019-10-06 ENCOUNTER — Other Ambulatory Visit: Payer: Self-pay | Admitting: Internal Medicine

## 2019-10-06 DIAGNOSIS — Z1231 Encounter for screening mammogram for malignant neoplasm of breast: Secondary | ICD-10-CM

## 2019-10-10 ENCOUNTER — Other Ambulatory Visit: Payer: Self-pay | Admitting: Internal Medicine

## 2019-10-10 ENCOUNTER — Other Ambulatory Visit: Payer: Self-pay

## 2019-10-10 ENCOUNTER — Ambulatory Visit
Admission: RE | Admit: 2019-10-10 | Discharge: 2019-10-10 | Disposition: A | Discharge status: Home / Self Care | Payer: BC Managed Care – PPO | Source: Ambulatory Visit | Attending: Internal Medicine | Admitting: Internal Medicine

## 2019-10-10 DIAGNOSIS — Z1231 Encounter for screening mammogram for malignant neoplasm of breast: Secondary | ICD-10-CM

## 2019-11-02 ENCOUNTER — Other Ambulatory Visit: Payer: Self-pay | Admitting: Internal Medicine

## 2019-11-06 DIAGNOSIS — K769 Liver disease, unspecified: Secondary | ICD-10-CM | POA: Diagnosis not present

## 2019-11-06 DIAGNOSIS — C569 Malignant neoplasm of unspecified ovary: Secondary | ICD-10-CM | POA: Diagnosis not present

## 2019-11-06 DIAGNOSIS — Z90721 Acquired absence of ovaries, unilateral: Secondary | ICD-10-CM | POA: Diagnosis not present

## 2019-11-06 DIAGNOSIS — R599 Enlarged lymph nodes, unspecified: Secondary | ICD-10-CM | POA: Diagnosis not present

## 2019-11-06 DIAGNOSIS — Z808 Family history of malignant neoplasm of other organs or systems: Secondary | ICD-10-CM | POA: Diagnosis not present

## 2019-11-06 DIAGNOSIS — Z800 Family history of malignant neoplasm of digestive organs: Secondary | ICD-10-CM | POA: Diagnosis not present

## 2019-11-06 DIAGNOSIS — Z8041 Family history of malignant neoplasm of ovary: Secondary | ICD-10-CM | POA: Diagnosis not present

## 2019-11-06 DIAGNOSIS — Z90710 Acquired absence of both cervix and uterus: Secondary | ICD-10-CM | POA: Diagnosis not present

## 2019-11-06 DIAGNOSIS — Z9221 Personal history of antineoplastic chemotherapy: Secondary | ICD-10-CM | POA: Diagnosis not present

## 2019-11-10 ENCOUNTER — Telehealth: Payer: Self-pay | Admitting: *Deleted

## 2019-11-10 ENCOUNTER — Other Ambulatory Visit: Payer: Self-pay | Admitting: Gynecologic Oncology

## 2019-11-10 ENCOUNTER — Encounter: Payer: Self-pay | Admitting: Gynecologic Oncology

## 2019-11-10 DIAGNOSIS — C561 Malignant neoplasm of right ovary: Secondary | ICD-10-CM

## 2019-11-10 NOTE — Telephone Encounter (Signed)
Called and left the patient a message to call the office back. The patient needs to be scheduled for a lab appt

## 2019-11-10 NOTE — Telephone Encounter (Signed)
Scheduled Ms Traxler for a lab appointment for 1200 tomorrow 11-11-19 for CA-125.

## 2019-11-11 ENCOUNTER — Other Ambulatory Visit: Payer: Self-pay

## 2019-11-11 ENCOUNTER — Inpatient Hospital Stay: Payer: BC Managed Care – PPO | Attending: Gynecologic Oncology

## 2019-11-11 DIAGNOSIS — C561 Malignant neoplasm of right ovary: Secondary | ICD-10-CM

## 2019-11-12 ENCOUNTER — Ambulatory Visit: Payer: BC Managed Care – PPO | Admitting: Internal Medicine

## 2019-11-12 LAB — CA 125: Cancer Antigen (CA) 125: 22.1 U/mL (ref 0.0–38.1)

## 2019-11-17 NOTE — Progress Notes (Signed)
Subjective:    Patient ID: Shelley Sanchez, female    DOB: 1955-08-25, 64 y.o.   MRN: [id number]  HPI The patient is here for follow up of their chronic medical problems, including hypothyroidism, migraine headaches, sleep difficulties, gerd, prediabetes  She is taking all of her medications as prescribed.    She is exercising a little - wants to do more.     Medications and allergies reviewed with patient and updated if appropriate.  Patient Active Problem List   Diagnosis Date Noted  . GERD (gastroesophageal reflux disease) 04/14/2019  . Rib pain 04/14/2019  . Cancer antigen 125 (CA 125) elevation 11/14/2018  . Sleep difficulties 04/10/2018  . Prediabetes 04/10/2018  . Migraine headache without aura 08/13/2017  . Hair loss 08/13/2017  . Chemotherapy-induced peripheral neuropathy ([place]) 10/02/2015  . 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 International Federation of Gynecology and Obstetrics (FIGO) stage IVB epithelial ovarian cancer ([place]) 03/11/2015  . Multiple pulmonary nodules 01/06/2014  . Squamous cell carcinoma of right hand   . Ovarian cancer on right ([place]) 05/21/2012  . Hypothyroid 03/07/2012    Current Outpatient Medications on File Prior to Visit  Medication Sig Dispense Refill  . famotidine (PEPCID) 40 MG tablet TAKE 1 TABLET BY MOUTH EVERY DAY 30 tablet 5  . ibuprofen (ADVIL,MOTRIN) 200 MG tablet Take 400 mg by mouth every 6 (six) hours as needed for headache. Reported on 07/01/2015    . loratadine (CLARITIN) 10 MG tablet Take 10 mg by mouth as needed. Reported on 09/30/2015     No current facility-administered medications on file prior to visit.    Past Medical History:  Diagnosis Date  . Allergy   . Ascites 03/2012   on CT a/p; s/p paracentesis  . Blood transfusion without reported diagnosis   . Cataract    removed right eye with lens   . Family history of ovarian cancer   . Headache   . Hyperthyroidism   . Migraines   . Ovarian cancer ([place]) 03/2012 dx     s/p debulk = stage III C poor diff, ov serous ca  . 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 Squamous cell carcinoma of right hand 2006   s/p excision    Past Surgical History:  Procedure Laterality Date  . ABDOMINAL HYSTERECTOMY  04/09/2012   Procedure: HYSTERECTOMY ABDOMINAL;  Surgeon: [name], MD PHD;  Location: [facility];  Service: Gynecology;  Laterality: N/A;  . BREAST ENHANCEMENT SURGERY  2000  . LAPAROTOMY  04/09/2012   Procedure: EXPLORATORY LAPAROTOMY;  Surgeon: [name], MD PHD;  Location: [facility];  Service: Gynecology;  Laterality: N/A;  EXPLORATORY LAPAROTOMY, TOTAL ABDOMINAL HYESTERCTOMY, BILATERAL SALPINGO OOPHERECTOMY  . mohs procedure to right side of base of nose       Social History   Socioeconomic History  . Marital status: Widowed    Spouse name: Not on file  . Number of children: 3  . Years of education: Not on file  . Highest education level: Not on file  Occupational History  . Not on file  Tobacco Use  . Smoking status: Never Smoker  . Smokeless tobacco: Never Used  Vaping Use  . Vaping Use: Never used  Substance and Sexual Activity  . Alcohol use: Yes    Alcohol/week: 2.0 standard drinks    Types: 2 Standard drinks or equivalent per week    Comment: occasional to rare - beer   . Drug use:  No  . Sexual activity: Yes  Other Topics Concern  . 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 Difficulty of Paying Living Expenses: Not on file  Food Insecurity:   . Worried About Charity fundraiser in the Last Year: Not on file  . Ran Out of Food in the Last Year: Not on file  Transportation Needs:   . Lack of Transportation (Medical): Not on file  . Lack of Transportation (Non-Medical): Not on file  Physical Activity:    . Days of Exercise per Week: Not on file  . Minutes of Exercise per Session: Not on file  Stress:   . Feeling of Stress : Not on file  Social Connections:   . Frequency of Communication with Friends and Family: Not on file  . Frequency of Social Gatherings with Friends and Family: Not on file  . Attends Religious Services: Not on file  . Active Member of Clubs or Organizations: Not on file  . Attends Archivist Meetings: Not on file  . Marital Status: Not on file    Family History  Problem Relation Age of Onset  . Melanoma Mother 55       on her back; TAH/BSO in ~30 d/t bleeding  . Rheum arthritis Mother   . Macular degeneration Mother   . Colon cancer Mother 63  . Skin cancer Brother 56       squamous cell  . Thyroid cancer Brother 19       s/p thyroidectomy  . Diabetes Brother   . Ovarian cancer Maternal Aunt 56       "abd ca" possible ovarian; deceased 59s  . Migraines Son   . Colon polyps Neg Hx   . Esophageal cancer Neg Hx   . Stomach cancer Neg Hx   . Rectal cancer Neg Hx     Review of Systems  Constitutional: Negative for chills and fever.  Respiratory: Positive for cough (alllergies/sinus). Negative for shortness of breath and wheezing.   Cardiovascular: Negative for chest pain, palpitations and leg swelling.  Neurological: Positive for dizziness (occ, related to eyes) and headaches. Negative for light-headedness.       Objective:   Vitals:   11/18/19 1451  BP: 126/82  Pulse: (!) 104  Temp: 97.9 F (36.6 C)  SpO2: 95%   BP Readings from Last 3 Encounters:  11/18/19 126/82  04/14/19 122/80  11/14/18 (!) 120/48   Wt Readings from Last 3 Encounters:  11/18/19 191 lb (86.6 kg)  04/14/19 191 lb (86.6 kg)  11/14/18 189 lb 9.6 oz (86 kg)   Body mass index is 29.91 kg/m.   Physical Exam    Constitutional: Appears well-developed and well-nourished. No distress.  HENT:  Head: Normocephalic and atraumatic.  Neck: Neck supple. No tracheal  deviation present. No thyromegaly present.  No cervical lymphadenopathy Cardiovascular: Normal rate, regular rhythm and normal heart sounds.   No murmur heard. No carotid bruit .  No edema Pulmonary/Chest: Effort normal and breath sounds normal. No respiratory distress. No has no wheezes. No rales.  Skin: Skin is warm and dry. Not diaphoretic.  Psychiatric: Normal mood and affect. Behavior is normal.      Assessment & Plan:    See  Problem List for Assessment and Plan of chronic medical problems.    This visit occurred during the SARS-CoV-2 public health emergency.  Safety protocols were in place, including screening questions prior to the visit, additional usage of staff PPE, and extensive cleaning of exam room while observing appropriate contact time as indicated for disinfecting solutions.

## 2019-11-17 NOTE — Patient Instructions (Addendum)
  Blood work was ordered.     Medications reviewed and updated.  Changes include :   none  Your prescription(s) have been submitted to your pharmacy. Please take as directed and contact our office if you believe you are having problem(s) with the medication(s).     Please followup in 1 year  

## 2019-11-18 ENCOUNTER — Other Ambulatory Visit: Payer: Self-pay

## 2019-11-18 ENCOUNTER — Encounter: Payer: Self-pay | Admitting: Internal Medicine

## 2019-11-18 ENCOUNTER — Ambulatory Visit (INDEPENDENT_AMBULATORY_CARE_PROVIDER_SITE_OTHER): Payer: BC Managed Care – PPO | Admitting: Internal Medicine

## 2019-11-18 VITALS — BP 126/82 | HR 104 | Temp 97.9°F | Ht 67.0 in | Wt 191.0 lb

## 2019-11-18 DIAGNOSIS — R7303 Prediabetes: Secondary | ICD-10-CM

## 2019-11-18 DIAGNOSIS — K219 Gastro-esophageal reflux disease without esophagitis: Secondary | ICD-10-CM

## 2019-11-18 DIAGNOSIS — E063 Autoimmune thyroiditis: Secondary | ICD-10-CM

## 2019-11-18 DIAGNOSIS — G43009 Migraine without aura, not intractable, without status migrainosus: Secondary | ICD-10-CM | POA: Diagnosis not present

## 2019-11-18 DIAGNOSIS — E038 Other specified hypothyroidism: Secondary | ICD-10-CM

## 2019-11-18 MED ORDER — LEVOTHYROXINE SODIUM 25 MCG PO TABS: 25.0000 ug | ORAL_TABLET | Freq: Every day | ORAL | 3 refills | Status: DC

## 2019-11-18 NOTE — Assessment & Plan Note (Addendum)
Chronic GERD controlled Continue prn medication pepcid daily prn

## 2019-11-18 NOTE — Assessment & Plan Note (Signed)
Chronic  Clinically euthyroid Check tsh  Titrate med dose if needed  

## 2019-11-18 NOTE — Assessment & Plan Note (Signed)
Chronic occasional migraines Takes advil as needed

## 2019-11-18 NOTE — Assessment & Plan Note (Signed)
Chronic Check a1c Low sugar / carb diet Stressed regular exercise  

## 2019-11-19 LAB — COMPLETE METABOLIC PANEL WITH GFR
AG Ratio: 1.3 (calc) (ref 1.0–2.5)
ALT: 21 U/L (ref 6–29)
AST: 21 U/L (ref 10–35)
Albumin: 4.3 g/dL (ref 3.6–5.1)
Alkaline phosphatase (APISO): 77 U/L (ref 37–153)
BUN: 18 mg/dL (ref 7–25)
CO2: 25 mmol/L (ref 20–32)
Calcium: 9.6 mg/dL (ref 8.6–10.4)
Chloride: 102 mmol/L (ref 98–110)
Creat: 0.93 mg/dL (ref 0.50–0.99)
GFR, Est African American: 75 mL/min/{1.73_m2} (ref >=60)
GFR, Est Non African American: 65 mL/min/{1.73_m2} (ref >=60)
Globulin: 3.2 g/dL (calc) (ref 1.9–3.7)
Glucose, Bld: 85 mg/dL (ref 65–99)
Potassium: 4.6 mmol/L (ref 3.5–5.3)
Sodium: 138 mmol/L (ref 135–146)
Total Bilirubin: 0.6 mg/dL (ref 0.2–1.2)
Total Protein: 7.5 g/dL (ref 6.1–8.1)

## 2019-11-19 LAB — HEMOGLOBIN A1C
Hgb A1c MFr Bld: 5.7 % of total Hgb — ABNORMAL HIGH (ref <5.7)
Mean Plasma Glucose: 117 (calc)
eAG (mmol/L): 6.5 (calc)

## 2019-11-19 LAB — TSH: TSH: 3.7 mIU/L (ref 0.40–4.50)

## 2019-12-18 DIAGNOSIS — L820 Inflamed seborrheic keratosis: Secondary | ICD-10-CM | POA: Diagnosis not present

## 2019-12-18 DIAGNOSIS — L821 Other seborrheic keratosis: Secondary | ICD-10-CM | POA: Diagnosis not present

## 2019-12-18 DIAGNOSIS — D225 Melanocytic nevi of trunk: Secondary | ICD-10-CM | POA: Diagnosis not present

## 2019-12-18 DIAGNOSIS — L570 Actinic keratosis: Secondary | ICD-10-CM | POA: Diagnosis not present

## 2019-12-18 DIAGNOSIS — L814 Other melanin hyperpigmentation: Secondary | ICD-10-CM | POA: Diagnosis not present

## 2019-12-18 DIAGNOSIS — D1801 Hemangioma of skin and subcutaneous tissue: Secondary | ICD-10-CM | POA: Diagnosis not present

## 2020-02-02 ENCOUNTER — Encounter: Payer: Self-pay | Admitting: Internal Medicine

## 2020-02-13 ENCOUNTER — Other Ambulatory Visit: Payer: Self-pay

## 2020-02-13 ENCOUNTER — Ambulatory Visit (INDEPENDENT_AMBULATORY_CARE_PROVIDER_SITE_OTHER): Payer: Medicare Other | Admitting: *Deleted

## 2020-02-13 DIAGNOSIS — Z23 Encounter for immunization: Secondary | ICD-10-CM

## 2020-04-03 ENCOUNTER — Ambulatory Visit: Payer: Medicare Other | Attending: Internal Medicine

## 2020-04-03 DIAGNOSIS — Z23 Encounter for immunization: Secondary | ICD-10-CM

## 2020-04-03 NOTE — Progress Notes (Signed)
   Covid-19 Vaccination Clinic  Name:  Shelley Sanchez    MRN: [id number] DOB: 16-Jun-1955  04/03/2020  Shelley Sanchez was observed post Covid-19 immunization for 15 minutes without incident. She was provided with Vaccine Information Sheet and instruction to access the V-Safe system.   Shelley Sanchez was instructed to call 911 with any severe reactions post vaccine: Marland Kitchen Difficulty breathing  . Swelling of face and throat  . A fast heartbeat  . A bad rash all over body  . Dizziness and weakness   Immunizations Administered    Name Date Dose VIS Date Route   Pfizer COVID-19 Vaccine 04/03/2020 12:39 PM 0.3 mL 01/14/2020 Intramuscular   Manufacturer: [place]   Lot: [id number]   [place]: [id number]

## 2020-04-07 ENCOUNTER — Other Ambulatory Visit: Payer: Self-pay | Admitting: Internal Medicine

## 2020-04-07 DIAGNOSIS — Z13820 Encounter for screening for osteoporosis: Secondary | ICD-10-CM

## 2020-04-07 DIAGNOSIS — E2839 Other primary ovarian failure: Secondary | ICD-10-CM

## 2020-05-18 ENCOUNTER — Ambulatory Visit (INDEPENDENT_AMBULATORY_CARE_PROVIDER_SITE_OTHER)
Admission: RE | Admit: 2020-05-18 | Discharge: 2020-05-18 | Disposition: A | Discharge status: Home / Self Care | Payer: Medicare Other | Source: Ambulatory Visit | Attending: Internal Medicine | Admitting: Internal Medicine

## 2020-05-18 ENCOUNTER — Other Ambulatory Visit: Payer: Self-pay

## 2020-05-18 DIAGNOSIS — E2839 Other primary ovarian failure: Secondary | ICD-10-CM | POA: Diagnosis not present

## 2020-05-18 DIAGNOSIS — Z13820 Encounter for screening for osteoporosis: Secondary | ICD-10-CM

## 2020-05-21 ENCOUNTER — Encounter: Payer: Self-pay | Admitting: Internal Medicine

## 2020-05-21 DIAGNOSIS — M8580 Other specified disorders of bone density and structure, unspecified site: Secondary | ICD-10-CM | POA: Insufficient documentation

## 2020-06-25 ENCOUNTER — Other Ambulatory Visit: Payer: Self-pay | Admitting: Internal Medicine

## 2020-06-25 DIAGNOSIS — Z1231 Encounter for screening mammogram for malignant neoplasm of breast: Secondary | ICD-10-CM

## 2020-10-07 ENCOUNTER — Other Ambulatory Visit: Payer: Self-pay

## 2020-10-07 ENCOUNTER — Ambulatory Visit (INDEPENDENT_AMBULATORY_CARE_PROVIDER_SITE_OTHER): Payer: Medicare Other

## 2020-10-07 DIAGNOSIS — Z23 Encounter for immunization: Secondary | ICD-10-CM

## 2020-10-07 NOTE — Progress Notes (Signed)
Pt received her 1st zoster vaccine a/w her pneumococcal 20 vaccine as well. Pt tolerated well w/o any complications.  Pt has been scheduled to come back on 12/09/2020 @ 3pm for her 2nd Zoster vaccine.

## 2020-10-11 ENCOUNTER — Other Ambulatory Visit: Payer: Self-pay

## 2020-10-11 ENCOUNTER — Ambulatory Visit
Admission: RE | Admit: 2020-10-11 | Discharge: 2020-10-11 | Disposition: A | Discharge status: Home / Self Care | Payer: Medicare Other | Source: Ambulatory Visit | Attending: Internal Medicine | Admitting: Internal Medicine

## 2020-10-11 DIAGNOSIS — Z1231 Encounter for screening mammogram for malignant neoplasm of breast: Secondary | ICD-10-CM

## 2020-11-30 ENCOUNTER — Other Ambulatory Visit: Payer: Self-pay | Admitting: Internal Medicine

## 2020-12-09 ENCOUNTER — Other Ambulatory Visit: Payer: Self-pay

## 2020-12-09 ENCOUNTER — Ambulatory Visit (INDEPENDENT_AMBULATORY_CARE_PROVIDER_SITE_OTHER): Payer: Medicare Other

## 2020-12-09 DIAGNOSIS — Z23 Encounter for immunization: Secondary | ICD-10-CM

## 2020-12-09 NOTE — Progress Notes (Signed)
Zoster vacc given w/o any complications. High Dose flu vacc given w/o any complications.

## 2021-02-03 ENCOUNTER — Ambulatory Visit: Payer: Medicare Other

## 2021-03-03 ENCOUNTER — Other Ambulatory Visit: Payer: Self-pay | Admitting: Internal Medicine

## 2021-04-17 ENCOUNTER — Encounter: Payer: Self-pay | Admitting: Internal Medicine

## 2021-04-17 NOTE — Progress Notes (Deleted)
Here for a subsequent medicare wellness exam.   I have personally reviewed and have noted 1.         The patient's medical and social history 2.         Their use of alcohol, tobacco or illicit drugs 3.         Their current medications and supplements 4.         The patient's functional ability including ADL's, fall risks, home                 safety risk and hearing or visual impairment. 5.         Diet and physical activities 6.         Evidence for depression or mood disorders 7.         Care team reviewed  - Dr [name] Gyn Onc, Dr [facility],   She is also here for routine follow up of her chronic medical problems including GERD, hypothyroidism, prediabetes, h/o ovarian ca.   Are there smokers in your home (other than you)? No  Risk Factors Exercise:  Dietary issues discussed:   Vitamin and supplement use:    Cardiac risk factors: advanced age, prediabetes  Depression Screen  Have you felt down, depressed or hopeless? No  Have you felt little interest or pleasure in doing things?  No  Activities of Daily Living In your present state of health, do you have any difficulty performing the following activities?:  Driving? No Managing money?  No Feeding yourself? No Getting from bed to chair? No Climbing a flight of stairs? No Preparing food and eating?: No Bathing or showering? No Getting dressed: No Getting to/using the toilet? No Moving around from place to place: No In the past year have you fallen or had a near fall?: No   Are you sexually active?  No  Do you have more than one partner?  N/A  Hearing Difficulties: No Do you often ask people to speak up or repeat themselves? No Do you experience ringing or noises in your ears? No Do you have difficulty understanding soft or whispered voices? No Vision:              Any change in vision:             Up to date with eye exam:   Memory:  Do you feel that you have a problem with memory? No  Do  you often misplace items? No  Do you feel safe at home?  Yes  Cognitive Testing  Alert, Orientated? Yes  Normal Appearance? Yes  Recall of three objects?  Yes  Can perform simple calculations? Yes  Displays appropriate judgment? Yes  Can read the correct time from a watch face? Yes   Advanced Directives have been discussed with the patient? Yes

## 2021-04-17 NOTE — Progress Notes (Signed)
Subjective:    Patient ID: Shelley Sanchez, female    DOB: January 16, 1956, 66 y.o.   MRN: [id number]  This visit occurred during the SARS-CoV-2 public health emergency.  Safety protocols were in place, including screening questions prior to the visit, additional usage of staff PPE, and extensive cleaning of exam room while observing appropriate contact time as indicated for disinfecting solutions.     HPI Here for a subsequent medicare wellness exam and follow up of her chronic medical problems including GERD, hypothyroidism, prediabetes, h/o ovarian ca. .   I have personally reviewed and have noted 1.         The patient's medical and social history 2.         Their use of alcohol, tobacco or illicit drugs 3.         Their current medications and supplements 4.         The patient's functional ability including ADL's, fall risks,               home safety risk and hearing or visual impairment. 5.         Diet and physical activities 6.         Evidence for depression or mood disorders 7.         Care team reviewed  - Dr [name] Gyn Onc, Dr [facility], eye doctor    Are there smokers in your home (other than you)? No  Risk Factors Exercise:  regular  Dietary issues discussed:  trying to improve  - has less discipline than when she was older-discussed the importance of lots of vegetables, fruits, lean protein-keep to minimum carbohydrates and sugars  Vitamin and supplement use:  senior centrum MVI    Cardiac risk factors: advanced age, prediabetes  Depression Screen  Have you felt down, depressed or hopeless? No  Have you felt little interest or pleasure in doing things?  No  Activities of Daily Living In your present state of health, do you have any difficulty performing the following activities?:  Driving? No Managing money?  No Feeding yourself? No Getting from bed to chair? No Climbing a flight of stairs? No Preparing food and eating?: No Bathing  or showering? No Getting dressed: No Getting to/using the toilet? No Moving around from place to place: No In the past year have you fallen or had a near fall?: No   Are you sexually active?  No  Do you have more than one partner?  No  Hearing Difficulties: No Do you often ask people to speak up or repeat themselves? No Do you experience ringing or noises in your ears? No Do you have difficulty understanding soft or whispered voices? No Vision:              Any change in vision:  no - does have cataracts             Up to date with eye exam:  yes  Memory:  Do you feel that you have a problem with memory? No  Do you often misplace items? No  Do you feel safe at home?  Yes  Cognitive Testing  Alert, Orientated? Yes  Normal Appearance? Yes  Recall of three objects?  Yes  Can perform simple calculations? 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 Yes   Advanced Directives have been discussed with the patient? Roberto Scales  does not have this in place and will work on that    Medications and allergies reviewed with patient and updated if appropriate.  Patient Active Problem List   Diagnosis Date Noted   Osteopenia 05/21/2020   GERD (gastroesophageal reflux disease) 04/14/2019   History of ovarian cancer 11/14/2018   Prediabetes 04/10/2018   Migraine headache without aura 08/13/2017   Chemotherapy-induced peripheral neuropathy ([place]) 10/02/2015   Status post splenectomy 03/13/2015   International Federation of Gynecology and Obstetrics (FIGO) stage IVB epithelial ovarian cancer ([place]) 03/11/2015   Multiple pulmonary nodules 01/06/2014   Squamous cell carcinoma of right hand    Ovarian cancer on right ([place]) 05/21/2012   Hypothyroid 03/07/2012    Current Outpatient Medications on File Prior to Visit  Medication Sig Dispense Refill   famotidine (PEPCID) 40 MG tablet TAKE 1 TABLET BY MOUTH EVERY DAY 30 tablet 5    ibuprofen (ADVIL,MOTRIN) 200 MG tablet Take 400 mg by mouth every 6 (six) hours as needed for headache. Reported on 07/01/2015     levothyroxine (SYNTHROID) 25 MCG tablet TAKE 1 TABLET BY MOUTH DAILY BEFORE BREAKFAST. 90 tablet 0   loratadine (CLARITIN) 10 MG tablet Take 10 mg by mouth as needed. Reported on 09/30/2015     No current facility-administered medications on file prior to visit.    Past Medical History:  Diagnosis Date   Allergy    Ascites 03/2012   on CT a/p; s/p paracentesis   Blood transfusion without reported diagnosis    Cataract    removed right eye with lens    Family history of ovarian cancer    Headache    Hyperthyroidism    Migraines    Ovarian cancer ([place]) 03/2012 dx   s/p debulk = stage III C poor diff, ov serous ca   Retinal detachment of right eye with giant retinal tear 2010   Squamous cell carcinoma of right hand 2006   s/p excision    Past Surgical History:  Procedure Laterality Date   ABDOMINAL HYSTERECTOMY  04/09/2012   Procedure: HYSTERECTOMY ABDOMINAL;  Surgeon: [name], MD PHD;  Location: [facility];  Service: Gynecology;  Laterality: N/A;   BREAST ENHANCEMENT SURGERY  2000   LAPAROTOMY  04/09/2012   Procedure: EXPLORATORY LAPAROTOMY;  Surgeon: [name], MD PHD;  Location: [facility];  Service: Gynecology;  Laterality: N/A;  EXPLORATORY LAPAROTOMY, TOTAL ABDOMINAL HYESTERCTOMY, BILATERAL SALPINGO OOPHERECTOMY   mohs procedure to right side of base of nose       Social History   Socioeconomic History   Marital status: Widowed    Spouse name: Not on file   Number of children: 3   Years of education: Not on file   Highest education level: Not on file  Occupational History   Not on file  Tobacco Use   Smoking status: Never   Smokeless tobacco: Never  Vaping Use   Vaping Use: Never used  Substance and Sexual Activity   Alcohol use: Yes    Alcohol/week: 2.0 standard drinks    Types: 2 Standard drinks or equivalent per week    Comment:  occasional to rare - beer    Drug use: No   Sexual activity: Yes  Other Topics Concern   Not on file  Social History Narrative   Lives with boyfriend   Moved to [place] from [place] psyc from [employer]   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, work from home   Social Determinants of  Health   Financial Resource Strain: Not on file  Food Insecurity: Not on file  Transportation Needs: Not on file  Physical Activity: Not on file  Stress: Not on file  Social Connections: Not on file    Family History  Problem Relation Age of Onset   Melanoma Mother 49       on her back; TAH/BSO in ~30 d/t bleeding   Rheum arthritis Mother    Macular degeneration Mother    Colon cancer Mother 58   Skin cancer Brother 52       squamous cell   Thyroid cancer Brother 9       s/p thyroidectomy   Diabetes Brother    Ovarian cancer Maternal Aunt 36       "abd ca" possible ovarian; deceased 56s   Migraines Son    Colon polyps Neg Hx    Esophageal cancer Neg Hx    Stomach cancer Neg Hx    Rectal cancer Neg Hx     Review of Systems  Constitutional:  Negative for chills and fever.  HENT:  Positive for postnasal drip. Negative for hearing loss and tinnitus.   Eyes:  Negative for visual disturbance.  Respiratory:  Positive for cough (dry, chronic). Negative for shortness of breath and wheezing.   Cardiovascular:  Negative for chest pain, palpitations and leg swelling.  Gastrointestinal:        Occ gerd  Neurological:  Positive for headaches (migraines). Negative for light-headedness.      Objective:   Vitals:   04/18/21 1335  BP: 110/80  Pulse: 81  Temp: 98 F (36.7 C)  SpO2: 97%   BP Readings from Last 3 Encounters:  04/18/21 110/80  11/18/19 126/82  04/14/19 122/80   Wt Readings from Last 3 Encounters:  04/18/21 194 lb (88 kg)  11/18/19 191 lb (86.6 kg)  04/14/19 191 lb (86.6 kg)   Body mass index is 30.38 kg/m.   Physical Exam    Constitutional: Appears  well-developed and well-nourished. No distress.  HENT:  Head: Normocephalic and atraumatic.  Neck: Neck supple. No tracheal deviation present. No thyromegaly present.  No cervical lymphadenopathy Cardiovascular: Normal rate, regular rhythm and normal heart sounds.   No murmur heard. No carotid bruit .  No edema Pulmonary/Chest: Effort normal and breath sounds normal. No respiratory distress. No has no wheezes. No rales.  Abdomen: soft, NT, ND Skin: Skin is warm and dry. Not diaphoretic.  Psychiatric: Normal mood and affect. Behavior is normal.      Assessment & Plan:    Health Maintenance  Topic Date Due   COVID-19 Vaccine (4 - Booster for Pfizer series) 05/04/2021 (Originally 05/29/2020)   MAMMOGRAM  10/12/2022   DEXA SCAN  05/19/2023   COLONOSCOPY (Pts 45-38yrs Insurance coverage will need to be confirmed)  10/16/2023   TETANUS/TDAP  01/18/2028   Pneumonia Vaccine 27+ Years old  Completed   INFLUENZA VACCINE  Completed   Hepatitis C Screening  Completed   Zoster Vaccines- Shingrix  Completed   HPV VACCINES  Aged Out     Wellness Exam: Immunizations-we will get last COVID booster.  Other immunizations up-to-date Colonoscopy   up-to-date Mammogram   up-to-date Dexa   up-to-date Gyn-currently following with GYN-oncology-not following with a regular oncologist at this time Eye exam   up-to-date Hearing loss   none Memory concerns/difficulties     none-still working part-time Independent of ADLs    -fully independent Stressed the importance of regular exercise, healthy diet  including lots of fruits and vegetables, lean protein-advised low amount of sugar/carbohydrates   Patient received copy of preventative screening tests/immunizations recommended for the next 5-10 years.    See Problem List for Assessment and Plan of chronic medical problems.

## 2021-04-17 NOTE — Patient Instructions (Addendum)
°  Ms. Stouffer , Thank you for taking time to come for your Medicare Wellness Visit. I appreciate your ongoing commitment to your health goals. Please review the following plan we discussed and let me know if I can assist you in the future.   These are the goals we discussed:  Goals   None     This is a list of the screening recommended for you and due dates:  Health Maintenance  Topic Date Due   COVID-19 Vaccine (4 - Booster for Pfizer series) 05/04/2021*   Mammogram  10/12/2022   DEXA scan (bone density measurement)  05/19/2023   Colon Cancer Screening  10/16/2023   Tetanus Vaccine  01/18/2028   Pneumonia Vaccine  Completed   Flu Shot  Completed   Hepatitis C Screening: USPSTF Recommendation to screen - Ages 18-79 yo.  Completed   Zoster (Shingles) Vaccine  Completed   HPV Vaccine  Aged Out  *Topic was postponed. The date shown is not the original due date.     Blood work was ordered.     Medications changes include :   maxalt 5 mg daily as needed for migraines  Your prescription(s) have been submitted to your pharmacy. Please take as directed and contact our office if you believe you are having problem(s) with the medication(s).   Please followup in 1 year

## 2021-04-18 ENCOUNTER — Ambulatory Visit (INDEPENDENT_AMBULATORY_CARE_PROVIDER_SITE_OTHER): Payer: Medicare Other | Admitting: Internal Medicine

## 2021-04-18 ENCOUNTER — Other Ambulatory Visit: Payer: Self-pay | Admitting: Internal Medicine

## 2021-04-18 ENCOUNTER — Other Ambulatory Visit: Payer: Self-pay

## 2021-04-18 VITALS — BP 110/80 | HR 81 | Temp 98.0°F | Ht 67.0 in | Wt 194.0 lb

## 2021-04-18 DIAGNOSIS — T451X5A Adverse effect of antineoplastic and immunosuppressive drugs, initial encounter: Secondary | ICD-10-CM

## 2021-04-18 DIAGNOSIS — Z0000 Encounter for general adult medical examination without abnormal findings: Secondary | ICD-10-CM | POA: Diagnosis not present

## 2021-04-18 DIAGNOSIS — E038 Other specified hypothyroidism: Secondary | ICD-10-CM | POA: Diagnosis not present

## 2021-04-18 DIAGNOSIS — R7303 Prediabetes: Secondary | ICD-10-CM

## 2021-04-18 DIAGNOSIS — M85851 Other specified disorders of bone density and structure, right thigh: Secondary | ICD-10-CM

## 2021-04-18 DIAGNOSIS — K219 Gastro-esophageal reflux disease without esophagitis: Secondary | ICD-10-CM | POA: Diagnosis not present

## 2021-04-18 DIAGNOSIS — Z8543 Personal history of malignant neoplasm of ovary: Secondary | ICD-10-CM

## 2021-04-18 DIAGNOSIS — G43009 Migraine without aura, not intractable, without status migrainosus: Secondary | ICD-10-CM | POA: Diagnosis not present

## 2021-04-18 DIAGNOSIS — G620 Drug-induced polyneuropathy: Secondary | ICD-10-CM

## 2021-04-18 DIAGNOSIS — E063 Autoimmune thyroiditis: Secondary | ICD-10-CM

## 2021-04-18 DIAGNOSIS — M85852 Other specified disorders of bone density and structure, left thigh: Secondary | ICD-10-CM

## 2021-04-18 LAB — CBC WITH DIFFERENTIAL/PLATELET
Basophils Absolute: 0.1 10*3/uL (ref 0.0–0.1)
Basophils Relative: 0.7 % (ref 0.0–3.0)
Eosinophils Absolute: 0.2 10*3/uL (ref 0.0–0.7)
Eosinophils Relative: 2.5 % (ref 0.0–5.0)
HCT: 43.7 % (ref 36.0–46.0)
Hemoglobin: 14.6 g/dL (ref 12.0–15.0)
Lymphocytes Relative: 40.6 % (ref 12.0–46.0)
Lymphs Abs: 3.1 10*3/uL (ref 0.7–4.0)
MCHC: 33.5 g/dL (ref 30.0–36.0)
MCV: 95 fl (ref 78.0–100.0)
Monocytes Absolute: 0.6 10*3/uL (ref 0.1–1.0)
Monocytes Relative: 7.7 % (ref 3.0–12.0)
Neutro Abs: 3.7 10*3/uL (ref 1.4–7.7)
Neutrophils Relative %: 48.5 % (ref 43.0–77.0)
Platelets: 318 10*3/uL (ref 150.0–400.0)
RBC: 4.61 Mil/uL (ref 3.87–5.11)
RDW: 13.5 % (ref 11.5–15.5)
WBC: 7.7 10*3/uL (ref 4.0–10.5)

## 2021-04-18 LAB — COMPREHENSIVE METABOLIC PANEL
ALT: 16 U/L (ref 0–35)
AST: 19 U/L (ref 0–37)
Albumin: 4.4 g/dL (ref 3.5–5.2)
Alkaline Phosphatase: 66 U/L (ref 39–117)
BUN: 11 mg/dL (ref 6–23)
CO2: 29 mEq/L (ref 19–32)
Calcium: 9.7 mg/dL (ref 8.4–10.5)
Chloride: 101 mEq/L (ref 96–112)
Creatinine, Ser: 0.82 mg/dL (ref 0.40–1.20)
GFR: 74.61 mL/min (ref >60.00)
Glucose, Bld: 93 mg/dL (ref 70–99)
Potassium: 4.3 mEq/L (ref 3.5–5.1)
Sodium: 138 mEq/L (ref 135–145)
Total Bilirubin: 0.6 mg/dL (ref 0.2–1.2)
Total Protein: 8 g/dL (ref 6.0–8.3)

## 2021-04-18 LAB — HEMOGLOBIN A1C: Hgb A1c MFr Bld: 5.8 % (ref 4.6–6.5)

## 2021-04-18 LAB — TSH: TSH: 3.97 u[IU]/mL (ref 0.35–5.50)

## 2021-04-18 MED ORDER — RIZATRIPTAN BENZOATE 5 MG PO TABS: 5.0000 mg | ORAL_TABLET | ORAL | 5 refills | Status: DC | PRN

## 2021-04-18 NOTE — Assessment & Plan Note (Signed)
Chronic  Clinically euthyroid Currently taking levothyroxine 25 mcg Check tsh  Titrate med dose if needed

## 2021-04-18 NOTE — Assessment & Plan Note (Signed)
Chronic Check a1c Low sugar / carb diet Stressed regular exercise  

## 2021-04-18 NOTE — Assessment & Plan Note (Signed)
Chronic dexa up to date Exercising Taking centrum MVI

## 2021-04-18 NOTE — Assessment & Plan Note (Signed)
Chronic GERD controlled Continue pepcid 40 mg daily prn

## 2021-04-18 NOTE — Assessment & Plan Note (Addendum)
Chronic occ migraines Can take otc medications Was on maxalt in the past and it worked well  -often better than OTC medications  restart maxalt 5 mg daily prn

## 2021-04-18 NOTE — Assessment & Plan Note (Addendum)
Following with gynecology-oncology on a regular basis

## 2021-04-18 NOTE — Assessment & Plan Note (Signed)
Chronic At times she has a burning pain in the bottom of her feet-usually worse at night Discussed that there is medication that can help if needed

## 2021-04-19 ENCOUNTER — Encounter: Payer: Self-pay | Admitting: Internal Medicine

## 2021-07-15 ENCOUNTER — Encounter: Payer: Self-pay | Admitting: Internal Medicine

## 2021-07-15 ENCOUNTER — Other Ambulatory Visit: Payer: Self-pay | Admitting: Internal Medicine

## 2021-07-15 MED ORDER — LEVOTHYROXINE SODIUM 25 MCG PO TABS: 25.0000 ug | ORAL_TABLET | Freq: Every day | ORAL | 2 refills | Status: DC

## 2021-10-25 ENCOUNTER — Other Ambulatory Visit: Payer: Self-pay

## 2021-10-25 ENCOUNTER — Telehealth: Payer: Self-pay | Admitting: Internal Medicine

## 2021-10-25 MED ORDER — LEVOTHYROXINE SODIUM 25 MCG PO TABS: 25.0000 ug | ORAL_TABLET | Freq: Every day | ORAL | 2 refills | Status: DC

## 2021-10-25 NOTE — Telephone Encounter (Signed)
Caller & Relationship to patient: Shelley Sanchez  Call back number: [phone]  Date of last office visit: 04/18/21  Date of next office visit: 04/19/22  Medication(s) to be refilled:  levothyroxine (SYNTHROID) 25 MCG tablet   Preferred Pharmacy:  [id number] [place] Phone:  [phone] Fax:  [phone]

## 2021-10-25 NOTE — Telephone Encounter (Signed)
Sent in today 

## 2021-12-05 ENCOUNTER — Other Ambulatory Visit: Payer: Self-pay | Admitting: Internal Medicine

## 2021-12-05 DIAGNOSIS — Z1231 Encounter for screening mammogram for malignant neoplasm of breast: Secondary | ICD-10-CM

## 2022-01-03 ENCOUNTER — Ambulatory Visit
Admission: RE | Admit: 2022-01-03 | Discharge: 2022-01-03 | Disposition: A | Discharge status: Home / Self Care | Payer: Medicare Other | Source: Ambulatory Visit | Attending: Internal Medicine | Admitting: Internal Medicine

## 2022-01-03 DIAGNOSIS — Z1231 Encounter for screening mammogram for malignant neoplasm of breast: Secondary | ICD-10-CM

## 2022-02-28 ENCOUNTER — Ambulatory Visit (INDEPENDENT_AMBULATORY_CARE_PROVIDER_SITE_OTHER): Payer: Medicare Other | Admitting: *Deleted

## 2022-02-28 DIAGNOSIS — Z23 Encounter for immunization: Secondary | ICD-10-CM

## 2022-04-18 ENCOUNTER — Encounter: Payer: Self-pay | Admitting: Internal Medicine

## 2022-04-18 NOTE — Patient Instructions (Addendum)
      Blood work was ordered.   The lab is on the first floor.    Medications changes include :   none      Return in about 1 year (around 04/20/2023) for follow up, schedule wellness visit with nurse after.

## 2022-04-18 NOTE — Progress Notes (Unsigned)
Subjective:    Patient ID: Shelley Sanchez, female    DOB: 1955-04-13, 67 y.o.   MRN: [id number]     HPI Shelley Sanchez is here for follow up of her chronic medical problems, including migraines, GERD, hypothyroid, osteopenia, prediabetes  Not exercising.  Loves to snack.  Would love to lose weight.    Medications and allergies reviewed with patient and updated if appropriate.  Current Outpatient Medications on File Prior to Visit  Medication Sig Dispense Refill   famotidine (PEPCID) 40 MG tablet TAKE 1 TABLET BY MOUTH EVERY DAY 30 tablet 5   ibuprofen (ADVIL,MOTRIN) 200 MG tablet Take 400 mg by mouth every 6 (six) hours as needed for headache. Reported on 07/01/2015     levothyroxine (SYNTHROID) 25 MCG tablet Take 1 tablet (25 mcg total) by mouth daily before breakfast. 90 tablet 2   loratadine (CLARITIN) 10 MG tablet Take 10 mg by mouth as needed. Reported on 09/30/2015     rizatriptan (MAXALT) 5 MG tablet Take 1 tablet (5 mg total) by mouth as needed for migraine. May repeat in 2 hours if needed 5 tablet 5   No current facility-administered medications on file prior to visit.     Review of Systems  Constitutional:  Negative for fever.  Respiratory:  Positive for cough (? allergies). Negative for shortness of breath and wheezing.   Cardiovascular:  Negative for chest pain, palpitations and leg swelling.  Gastrointestinal:  Positive for abdominal distention. Negative for abdominal pain, blood in stool, constipation, diarrhea and nausea.  Neurological:  Positive for headaches. Negative for dizziness and light-headedness.  Psychiatric/Behavioral:  Negative for dysphoric mood. The patient is not nervous/anxious.        Objective:   Vitals:   04/19/22 1040  BP: 116/74  Pulse: 70  Temp: 98 F (36.7 C)  SpO2: 97%   BP Readings from Last 3 Encounters:  04/19/22 116/74  04/18/21 110/80  11/18/19 126/82   Wt Readings from Last 3 Encounters:  04/19/22 198 lb (89.8 kg)   04/18/21 194 lb (88 kg)  11/18/19 191 lb (86.6 kg)   Body mass index is 31.01 kg/m.    Physical Exam Constitutional:      General: She is not in acute distress.    Appearance: Normal appearance.  HENT:     Head: Normocephalic and atraumatic.  Eyes:     Conjunctiva/sclera: Conjunctivae normal.  Cardiovascular:     Rate and Rhythm: Normal rate and regular rhythm.     Heart sounds: Normal heart sounds. No murmur heard. Pulmonary:     Effort: Pulmonary effort is normal. No respiratory distress.     Breath sounds: Normal breath sounds. No wheezing.  Abdominal:     General: There is no distension.     Palpations: Abdomen is soft.     Tenderness: There is no abdominal tenderness.  Musculoskeletal:     Cervical back: Neck supple.     Right lower leg: No edema.     Left lower leg: No edema.  Lymphadenopathy:     Cervical: No cervical adenopathy.  Skin:    General: Skin is warm and dry.     Findings: No rash.  Neurological:     Mental Status: She is alert. Mental status is at baseline.  Psychiatric:        Mood and Affect: Mood normal.        Behavior: Behavior normal.        Lab Results  Component Value Date   WBC 7.7 04/18/2021   HGB 14.6 04/18/2021   HCT 43.7 04/18/2021   PLT 318.0 04/18/2021   GLUCOSE 93 04/18/2021   CHOL 191 07/11/2016   TRIG 71.0 07/11/2016   HDL 58.00 07/11/2016   LDLCALC 119 (H) 07/11/2016   ALT 16 04/18/2021   AST 19 04/18/2021   NA 138 04/18/2021   K 4.3 04/18/2021   CL 101 04/18/2021   CREATININE 0.82 04/18/2021   BUN 11 04/18/2021   CO2 29 04/18/2021   TSH 3.97 04/18/2021   INR 1.04 01/26/2015   HGBA1C 5.8 04/18/2021     Assessment & Plan:    See Problem List for Assessment and Plan of chronic medical problems.

## 2022-04-19 ENCOUNTER — Ambulatory Visit (INDEPENDENT_AMBULATORY_CARE_PROVIDER_SITE_OTHER): Payer: Medicare Other | Admitting: Internal Medicine

## 2022-04-19 VITALS — BP 116/74 | HR 70 | Temp 98.0°F | Ht 67.0 in | Wt 198.0 lb

## 2022-04-19 DIAGNOSIS — R7303 Prediabetes: Secondary | ICD-10-CM | POA: Diagnosis not present

## 2022-04-19 DIAGNOSIS — E559 Vitamin D deficiency, unspecified: Secondary | ICD-10-CM | POA: Diagnosis not present

## 2022-04-19 DIAGNOSIS — E038 Other specified hypothyroidism: Secondary | ICD-10-CM

## 2022-04-19 DIAGNOSIS — K219 Gastro-esophageal reflux disease without esophagitis: Secondary | ICD-10-CM | POA: Diagnosis not present

## 2022-04-19 DIAGNOSIS — M85851 Other specified disorders of bone density and structure, right thigh: Secondary | ICD-10-CM

## 2022-04-19 DIAGNOSIS — M85852 Other specified disorders of bone density and structure, left thigh: Secondary | ICD-10-CM

## 2022-04-19 DIAGNOSIS — E063 Autoimmune thyroiditis: Secondary | ICD-10-CM

## 2022-04-19 DIAGNOSIS — G43009 Migraine without aura, not intractable, without status migrainosus: Secondary | ICD-10-CM | POA: Diagnosis not present

## 2022-04-19 DIAGNOSIS — Z13220 Encounter for screening for lipoid disorders: Secondary | ICD-10-CM | POA: Diagnosis not present

## 2022-04-19 LAB — COMPREHENSIVE METABOLIC PANEL
ALT: 21 U/L (ref 0–35)
AST: 23 U/L (ref 0–37)
Albumin: 4.3 g/dL (ref 3.5–5.2)
Alkaline Phosphatase: 75 U/L (ref 39–117)
BUN: 21 mg/dL (ref 6–23)
CO2: 28 mEq/L (ref 19–32)
Calcium: 9.8 mg/dL (ref 8.4–10.5)
Chloride: 101 mEq/L (ref 96–112)
Creatinine, Ser: 0.78 mg/dL (ref 0.40–1.20)
GFR: 78.67 mL/min (ref >60.00)
Glucose, Bld: 95 mg/dL (ref 70–99)
Potassium: 4.7 mEq/L (ref 3.5–5.1)
Sodium: 137 mEq/L (ref 135–145)
Total Bilirubin: 0.4 mg/dL (ref 0.2–1.2)
Total Protein: 8.1 g/dL (ref 6.0–8.3)

## 2022-04-19 LAB — CBC WITH DIFFERENTIAL/PLATELET
Basophils Absolute: 0.1 10*3/uL (ref 0.0–0.1)
Basophils Relative: 1 % (ref 0.0–3.0)
Eosinophils Absolute: 0.2 10*3/uL (ref 0.0–0.7)
Eosinophils Relative: 3.8 % (ref 0.0–5.0)
HCT: 41.4 % (ref 36.0–46.0)
Hemoglobin: 14 g/dL (ref 12.0–15.0)
Lymphocytes Relative: 45.1 % (ref 12.0–46.0)
Lymphs Abs: 2.9 10*3/uL (ref 0.7–4.0)
MCHC: 33.8 g/dL (ref 30.0–36.0)
MCV: 96.1 fl (ref 78.0–100.0)
Monocytes Absolute: 0.7 10*3/uL (ref 0.1–1.0)
Monocytes Relative: 10.8 % (ref 3.0–12.0)
Neutro Abs: 2.6 10*3/uL (ref 1.4–7.7)
Neutrophils Relative %: 39.3 % — ABNORMAL LOW (ref 43.0–77.0)
Platelets: 343 10*3/uL (ref 150.0–400.0)
RBC: 4.31 Mil/uL (ref 3.87–5.11)
RDW: 13.6 % (ref 11.5–15.5)
WBC: 6.5 10*3/uL (ref 4.0–10.5)

## 2022-04-19 LAB — LIPID PANEL
Cholesterol: 189 mg/dL (ref 0–200)
HDL: 56.9 mg/dL (ref >39.00)
LDL Cholesterol: 110 mg/dL — ABNORMAL HIGH (ref 0–99)
NonHDL: 132.34
Total CHOL/HDL Ratio: 3
Triglycerides: 114 mg/dL (ref 0.0–149.0)
VLDL: 22.8 mg/dL (ref 0.0–40.0)

## 2022-04-19 LAB — VITAMIN D 25 HYDROXY (VIT D DEFICIENCY, FRACTURES): VITD: 15.9 ng/mL — ABNORMAL LOW (ref 30.00–100.00)

## 2022-04-19 LAB — HEMOGLOBIN A1C: Hgb A1c MFr Bld: 5.9 % (ref 4.6–6.5)

## 2022-04-19 LAB — TSH: TSH: 4.19 u[IU]/mL (ref 0.35–5.50)

## 2022-04-19 MED ORDER — RIZATRIPTAN BENZOATE 5 MG PO TABS: 5.0000 mg | ORAL_TABLET | ORAL | 11 refills | Status: DC | PRN

## 2022-04-19 NOTE — Assessment & Plan Note (Signed)
Chronic Check a1c Low sugar / carb diet Stressed regular exercise   

## 2022-04-19 NOTE — Assessment & Plan Note (Addendum)
Chronic GERD intermittent-typically she has it at night after late-night eating Discussed causes of GERD and risks of frequent GERD Not taking any medication now Can adjust lifestyle/eating Taking tums, pepcid 20 mg otc prn

## 2022-04-19 NOTE — Assessment & Plan Note (Addendum)
Chronic Frequent  migraines more recently due to weather changes Taking excedrin migraine as needed-has had some days where she takes multiple doses or will take it a few times a week.  Stressed that she cannot take this more than twice a week because of the risk of rebound headaches, damage to her stomach and kidneys Continue maxalt 5 mg prn-has not taken this in a while-new prescription sent to pharmacy and advised to take this if she think she is having a migraine

## 2022-04-19 NOTE — Assessment & Plan Note (Signed)
Chronic Taking vitamin d daily Check vitamin d level  

## 2022-04-19 NOTE — Assessment & Plan Note (Addendum)
Chronic Thyroid antibodies + in  2018 Clinically euthyroid Check tsh and will titrate med dose if needed Currently taking levothyroxine 25 mcg daily

## 2022-04-19 NOTE — Assessment & Plan Note (Signed)
Chronic Dexa up to date Stressed regular exercise Advised calcium and vitamin d daily

## 2022-04-20 ENCOUNTER — Ambulatory Visit (INDEPENDENT_AMBULATORY_CARE_PROVIDER_SITE_OTHER): Payer: Medicare Other | Admitting: Obstetrics & Gynecology

## 2022-04-20 ENCOUNTER — Encounter (HOSPITAL_BASED_OUTPATIENT_CLINIC_OR_DEPARTMENT_OTHER): Payer: Self-pay | Admitting: Obstetrics & Gynecology

## 2022-04-20 VITALS — BP 118/56 | HR 76 | Ht 67.0 in | Wt 197.0 lb

## 2022-04-20 DIAGNOSIS — Z8543 Personal history of malignant neoplasm of ovary: Secondary | ICD-10-CM | POA: Diagnosis not present

## 2022-04-20 DIAGNOSIS — C569 Malignant neoplasm of unspecified ovary: Secondary | ICD-10-CM

## 2022-04-20 DIAGNOSIS — G43009 Migraine without aura, not intractable, without status migrainosus: Secondary | ICD-10-CM | POA: Diagnosis not present

## 2022-04-20 DIAGNOSIS — E038 Other specified hypothyroidism: Secondary | ICD-10-CM | POA: Diagnosis not present

## 2022-04-20 DIAGNOSIS — E063 Autoimmune thyroiditis: Secondary | ICD-10-CM

## 2022-04-20 NOTE — Progress Notes (Signed)
67 y.o. G3P3 Widowed White or Caucasian here for new patient appointment.  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  Initial debulking surgery was 04/09/2022.  Treated with six cycles of taxol/carboplatin.  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  Had splenectomy and chemotherapy again with taxol and carboplatin.    No LMP recorded. Patient has had a hysterectomy.          Sexually active: occasionally  The current method of family planning is status post hysterectomy.    Smoker:  no  Health Maintenance: Pap:  not indicated History of abnormal Pap:  no MMG:  01/04/2022 Colonoscopy:  10/16/2018.  Follow up 5 years.  Dr. Tarri Glenn BMD:   04/2019, -1.7 Screening Labs: done yesterday with Dr. Quay Burow   reports that she has never smoked. She has never used smokeless tobacco. She reports current alcohol use of about 2.0 standard drinks of alcohol per week. She reports that she does not use drugs.  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   Squamous cell carcinoma of right hand 2006   s/p excision    Past Surgical History:  Procedure Laterality Date   ABDOMINAL HYSTERECTOMY  04/09/2012   Procedure: HYSTERECTOMY ABDOMINAL;  Surgeon: [name], MD PHD;  Location: [facility];  Service: Gynecology;  Laterality: N/A;   BREAST ENHANCEMENT SURGERY  2000   LAPAROTOMY  04/09/2012   Procedure: EXPLORATORY LAPAROTOMY;  Surgeon: [name], MD PHD;  Location: [facility];  Service: Gynecology;  Laterality: N/A;  EXPLORATORY LAPAROTOMY, TOTAL ABDOMINAL HYESTERCTOMY, BILATERAL SALPINGO OOPHERECTOMY   mohs  procedure to right side of base of nose       Current Outpatient Medications  Medication Sig Dispense Refill   ibuprofen (ADVIL,MOTRIN) 200 MG tablet Take 400 mg by mouth every 6 (six) hours as needed for headache. Reported on 07/01/2015     levothyroxine (SYNTHROID) 25 MCG tablet Take 1 tablet (25 mcg total) by mouth daily before breakfast. 90 tablet 2   loratadine (CLARITIN) 10 MG tablet Take 10 mg by mouth as needed. Reported on 09/30/2015     Multiple Vitamins-Minerals (CENTRUM VITAMINTS PO) Take 1 tablet by mouth daily.     rizatriptan (MAXALT) 5 MG tablet Take 1 tablet (5 mg total) by mouth as needed for migraine. May repeat in 2 hours if needed 10 tablet 11   No current facility-administered medications for this visit.    Family History  Problem Relation Age of Onset   Melanoma Mother 17       on her back; TAH/BSO in ~30 d/t bleeding   Rheum arthritis Mother    Macular degeneration Mother    Colon cancer Mother 25   Skin cancer Brother 19       squamous cell   Thyroid cancer Brother 51       s/p thyroidectomy   Diabetes Brother    Ovarian cancer Maternal Aunt 85       "abd ca" possible ovarian; deceased 39s  Migraines Son    Colon polyps Neg Hx    Esophageal cancer Neg Hx    Stomach cancer Neg Hx    Rectal cancer Neg Hx     ROS: Constitutional: negative Genitourinary:negative  Exam:   BP (!) 118/56 (BP Location: Left Arm, Patient Position: Sitting, Cuff Size: Large)   Pulse 76   Ht '5\' 7"'$  (1.702 m) Comment: Reported  Wt 197 lb (89.4 kg)   BMI 30.85 kg/m   Height: '5\' 7"'$  (170.2 cm) (Reported)  General appearance: alert, cooperative and appears stated age Head: Normocephalic, without obvious abnormality, atraumatic Neck: no adenopathy, supple, symmetrical, trachea midline and thyroid normal to inspection and palpation Lungs: clear to auscultation bilaterally Breasts: normal appearance, no masses or tenderness Heart: regular rate and rhythm Abdomen: soft,  non-tender; bowel sounds normal; no masses,  no organomegaly Extremities: extremities normal, atraumatic, no cyanosis or edema Skin: Skin color, texture, turgor normal. No rashes or lesions Lymph nodes: Cervical, supraclavicular, and axillary nodes normal. No abnormal inguinal nodes palpated Neurologic: Grossly normal   Pelvic: External genitalia:  no lesions              Urethra:  normal appearing urethra with no masses, tenderness or lesions              Bartholins and Skenes: normal                 Vagina: normal appearing vagina with normal color and no discharge, no lesions              Cervix: absent              Pap taken: No. Bimanual Exam:  Uterus:  uterus absent              Adnexa: no mass, fullness, tenderness               Rectovaginal: Confirms               Anus:  normal sphincter tone, no lesions  Chaperone, Octaviano Batty, CMA, was present for exam.  Assessment/Plan: 1. History of ovarian cancer - CA 125 - will recheck 6 months - will reach out to Dr. Berline Lopes for discussion about future CT scans.  Pt is comfortable with any plan as she knows she is past 5 years and that this is very uncommon and good.  2. [employer] (FIGO) stage IVB epithelial ovarian cancer ([place])   3. Hypothyroidism due to Hashimoto's thyroiditis - on levothyroxine  4. Migraine without aura and without status migrainosus, not intractable - on maxalt prn for headaches

## 2022-04-21 LAB — CA 125: Cancer Antigen (CA) 125: 22.5 U/mL (ref 0.0–38.1)

## 2022-10-31 ENCOUNTER — Other Ambulatory Visit: Payer: Self-pay | Admitting: Obstetrics & Gynecology

## 2022-10-31 ENCOUNTER — Other Ambulatory Visit (HOSPITAL_BASED_OUTPATIENT_CLINIC_OR_DEPARTMENT_OTHER): Payer: Medicare Other

## 2022-10-31 DIAGNOSIS — Z8543 Personal history of malignant neoplasm of ovary: Secondary | ICD-10-CM

## 2022-11-17 ENCOUNTER — Other Ambulatory Visit: Payer: Self-pay | Admitting: Internal Medicine

## 2023-03-01 ENCOUNTER — Other Ambulatory Visit: Payer: Self-pay | Admitting: Internal Medicine

## 2023-03-01 DIAGNOSIS — Z1231 Encounter for screening mammogram for malignant neoplasm of breast: Secondary | ICD-10-CM

## 2023-03-08 ENCOUNTER — Ambulatory Visit (INDEPENDENT_AMBULATORY_CARE_PROVIDER_SITE_OTHER): Payer: Medicare Other

## 2023-03-08 DIAGNOSIS — Z23 Encounter for immunization: Secondary | ICD-10-CM | POA: Diagnosis not present

## 2023-03-08 NOTE — Progress Notes (Signed)
PT visits today for their high dose flu vaccine. PT informed of what they had received and tolerated injection well. PT advised to reach out to office if needed.

## 2023-04-03 ENCOUNTER — Ambulatory Visit: Payer: Medicare Other

## 2023-04-10 ENCOUNTER — Ambulatory Visit
Admission: RE | Admit: 2023-04-10 | Discharge: 2023-04-10 | Disposition: A | Discharge status: Home / Self Care | Payer: Medicare Other | Source: Ambulatory Visit | Attending: Internal Medicine

## 2023-04-10 DIAGNOSIS — Z1231 Encounter for screening mammogram for malignant neoplasm of breast: Secondary | ICD-10-CM

## 2023-04-19 ENCOUNTER — Encounter: Payer: Self-pay | Admitting: Internal Medicine

## 2023-04-19 NOTE — Patient Instructions (Addendum)
      Blood work was ordered.       Medications changes include :   sertraline 50 mg daily      Return in about 2 months (around 06/18/2023) for follow up, Schedule DEXA-[place] in March or later.

## 2023-04-19 NOTE — Progress Notes (Signed)
Subjective:    Patient ID: Shelley Sanchez, female    DOB: Jun 27, 1955, 68 y.o.   MRN: [id number]     HPI Leena is here for follow up of her chronic medical problems.  Probably building for a while, but she is not sure why this started- starting to have some anxiety issues when driving in heavy traffic or driving fast.  Noticed this in the past year going to work.  She went to visit her son after christmas and went the long way to avoid higher traffic volumes- she had to stop a few times to get out and walk around and relax - once she did that she was ok and able to drive w/o difficulty.   She finds herself gripping the steering wheel very hard whenever she drives.  She feels more comfortable if there is someone slow in front of her and she can just stay behind them.    She denies previous accidents.  Took car to shop and was told about some unexpected issues with the car - extra costs and was waiting for car - sitting in the waiting room and had dizziness - room was spinning. Episode of vertigo.  ? Felt anxious at the time or before dizziness started.  She is not sure if that just happened for no reason or if it was anxiety.  Not exercising regularly.    Medications and allergies reviewed with patient and updated if appropriate.  Current Outpatient Medications on File Prior to Visit  Medication Sig Dispense Refill   ibuprofen (ADVIL,MOTRIN) 200 MG tablet Take 400 mg by mouth every 6 (six) hours as needed for headache. Reported on 07/01/2015     levothyroxine (SYNTHROID) 25 MCG tablet TAKE 1 TABLET BY MOUTH DAILY BEFORE BREAKFAST. 90 tablet 2   Multiple Vitamins-Minerals (CENTRUM VITAMINTS PO) Take 1 tablet by mouth daily.     rizatriptan (MAXALT) 5 MG tablet Take 1 tablet (5 mg total) by mouth as needed for migraine. May repeat in 2 hours if needed 10 tablet 11   No current facility-administered medications on file prior to visit.     Review of Systems  Constitutional:  Negative  for fever.  Respiratory:  Positive for cough (chronic - no change). Negative for shortness of breath and wheezing.   Cardiovascular:  Negative for chest pain, palpitations and leg swelling.  Gastrointestinal:  Negative for abdominal pain, blood in stool, constipation and diarrhea.  Neurological:  Positive for dizziness (once) and headaches (migraines). Negative for light-headedness.  Psychiatric/Behavioral:  Negative for dysphoric mood. The patient is nervous/anxious (with driving).        Objective:   Vitals:   04/20/23 1059  BP: 112/76  Pulse: 66  SpO2: 96%   BP Readings from Last 3 Encounters:  04/20/23 112/76  04/20/23 118/76  04/20/22 (!) 118/56   Wt Readings from Last 3 Encounters:  04/20/23 205 lb 9.6 oz (93.3 kg)  04/20/23 206 lb 9.6 oz (93.7 kg)  04/20/22 197 lb (89.4 kg)   Body mass index is 32.2 kg/m.    Physical Exam Constitutional:      General: She is not in acute distress.    Appearance: Normal appearance.  HENT:     Head: Normocephalic and atraumatic.  Eyes:     Conjunctiva/sclera: Conjunctivae normal.  Cardiovascular:     Rate and Rhythm: Normal rate and regular rhythm.     Heart sounds: Normal heart sounds.  Pulmonary:     Effort:  Pulmonary effort is normal. No respiratory distress.     Breath sounds: Normal breath sounds. No wheezing.  Abdominal:     General: There is no distension.     Palpations: Abdomen is soft.     Tenderness: There is no abdominal tenderness.  Musculoskeletal:     Cervical back: Neck supple.     Right lower leg: No edema.     Left lower leg: No edema.  Lymphadenopathy:     Cervical: No cervical adenopathy.  Skin:    General: Skin is warm and dry.     Findings: No rash.  Neurological:     Mental Status: She is alert. Mental status is at baseline.  Psychiatric:        Mood and Affect: Mood normal.        Behavior: Behavior normal.        Lab Results  Component Value Date   WBC 6.5 04/19/2022   HGB 14.0  04/19/2022   HCT 41.4 04/19/2022   PLT 343.0 04/19/2022   GLUCOSE 95 04/19/2022   CHOL 189 04/19/2022   TRIG 114.0 04/19/2022   HDL 56.90 04/19/2022   LDLCALC 110 (H) 04/19/2022   ALT 21 04/19/2022   AST 23 04/19/2022   NA 137 04/19/2022   K 4.7 04/19/2022   CL 101 04/19/2022   CREATININE 0.78 04/19/2022   BUN 21 04/19/2022   CO2 28 04/19/2022   TSH 4.19 04/19/2022   INR 1.04 01/26/2015   HGBA1C 5.9 04/19/2022     Assessment & Plan:    See Problem List for Assessment and Plan of chronic medical problems.

## 2023-04-20 ENCOUNTER — Ambulatory Visit (INDEPENDENT_AMBULATORY_CARE_PROVIDER_SITE_OTHER): Payer: Medicare Other

## 2023-04-20 ENCOUNTER — Ambulatory Visit (INDEPENDENT_AMBULATORY_CARE_PROVIDER_SITE_OTHER): Payer: Medicare Other | Admitting: Internal Medicine

## 2023-04-20 ENCOUNTER — Encounter: Payer: Self-pay | Admitting: Internal Medicine

## 2023-04-20 VITALS — BP 118/76 | HR 72 | Ht 67.0 in | Wt 206.6 lb

## 2023-04-20 VITALS — BP 112/76 | HR 66 | Ht 67.0 in | Wt 205.6 lb

## 2023-04-20 DIAGNOSIS — E2839 Other primary ovarian failure: Secondary | ICD-10-CM

## 2023-04-20 DIAGNOSIS — Z780 Asymptomatic menopausal state: Secondary | ICD-10-CM | POA: Diagnosis not present

## 2023-04-20 DIAGNOSIS — M85851 Other specified disorders of bone density and structure, right thigh: Secondary | ICD-10-CM

## 2023-04-20 DIAGNOSIS — E559 Vitamin D deficiency, unspecified: Secondary | ICD-10-CM | POA: Diagnosis not present

## 2023-04-20 DIAGNOSIS — G43009 Migraine without aura, not intractable, without status migrainosus: Secondary | ICD-10-CM | POA: Diagnosis not present

## 2023-04-20 DIAGNOSIS — F419 Anxiety disorder, unspecified: Secondary | ICD-10-CM | POA: Insufficient documentation

## 2023-04-20 DIAGNOSIS — Z0000 Encounter for general adult medical examination without abnormal findings: Secondary | ICD-10-CM

## 2023-04-20 DIAGNOSIS — E063 Autoimmune thyroiditis: Secondary | ICD-10-CM

## 2023-04-20 DIAGNOSIS — R7303 Prediabetes: Secondary | ICD-10-CM

## 2023-04-20 DIAGNOSIS — M85852 Other specified disorders of bone density and structure, left thigh: Secondary | ICD-10-CM

## 2023-04-20 LAB — COMPREHENSIVE METABOLIC PANEL
ALT: 20 U/L (ref 0–35)
AST: 22 U/L (ref 0–37)
Albumin: 4.7 g/dL (ref 3.5–5.2)
Alkaline Phosphatase: 79 U/L (ref 39–117)
BUN: 13 mg/dL (ref 6–23)
CO2: 28 meq/L (ref 19–32)
Calcium: 9.9 mg/dL (ref 8.4–10.5)
Chloride: 101 meq/L (ref 96–112)
Creatinine, Ser: 0.73 mg/dL (ref 0.40–1.20)
GFR: 84.58 mL/min (ref >60.00)
Glucose, Bld: 103 mg/dL — ABNORMAL HIGH (ref 70–99)
Potassium: 4.4 meq/L (ref 3.5–5.1)
Sodium: 140 meq/L (ref 135–145)
Total Bilirubin: 0.5 mg/dL (ref 0.2–1.2)
Total Protein: 8.6 g/dL — ABNORMAL HIGH (ref 6.0–8.3)

## 2023-04-20 LAB — CBC WITH DIFFERENTIAL/PLATELET
Basophils Absolute: 0.1 10*3/uL (ref 0.0–0.1)
Basophils Relative: 0.7 % (ref 0.0–3.0)
Eosinophils Absolute: 0.2 10*3/uL (ref 0.0–0.7)
Eosinophils Relative: 2.9 % (ref 0.0–5.0)
HCT: 43.3 % (ref 36.0–46.0)
Hemoglobin: 14.4 g/dL (ref 12.0–15.0)
Lymphocytes Relative: 45.6 % (ref 12.0–46.0)
Lymphs Abs: 3.7 10*3/uL (ref 0.7–4.0)
MCHC: 33.3 g/dL (ref 30.0–36.0)
MCV: 97.4 fL (ref 78.0–100.0)
Monocytes Absolute: 0.7 10*3/uL (ref 0.1–1.0)
Monocytes Relative: 8.7 % (ref 3.0–12.0)
Neutro Abs: 3.4 10*3/uL (ref 1.4–7.7)
Neutrophils Relative %: 42.1 % — ABNORMAL LOW (ref 43.0–77.0)
Platelets: 342 10*3/uL (ref 150.0–400.0)
RBC: 4.45 Mil/uL (ref 3.87–5.11)
RDW: 13.8 % (ref 11.5–15.5)
WBC: 8.1 10*3/uL (ref 4.0–10.5)

## 2023-04-20 LAB — VITAMIN D 25 HYDROXY (VIT D DEFICIENCY, FRACTURES): VITD: 26.98 ng/mL — ABNORMAL LOW (ref 30.00–100.00)

## 2023-04-20 LAB — TSH: TSH: 4.46 u[IU]/mL (ref 0.35–5.50)

## 2023-04-20 LAB — LIPID PANEL
Cholesterol: 214 mg/dL — ABNORMAL HIGH (ref 0–200)
HDL: 54.4 mg/dL (ref >39.00)
LDL Cholesterol: 132 mg/dL — ABNORMAL HIGH (ref 0–99)
NonHDL: 159.19
Total CHOL/HDL Ratio: 4
Triglycerides: 138 mg/dL (ref 0.0–149.0)
VLDL: 27.6 mg/dL (ref 0.0–40.0)

## 2023-04-20 LAB — HEMOGLOBIN A1C: Hgb A1c MFr Bld: 6.1 % (ref 4.6–6.5)

## 2023-04-20 MED ORDER — SERTRALINE HCL 50 MG PO TABS: 50.0000 mg | ORAL_TABLET | Freq: Every day | ORAL | 3 refills | Status: DC

## 2023-04-20 NOTE — Assessment & Plan Note (Signed)
Chronic Thyroid antibodies + in  2018 Clinically euthyroid Check tsh and will titrate med dose if needed Currently taking levothyroxine 25 mcg daily

## 2023-04-20 NOTE — Progress Notes (Signed)
Subjective:   Shelley Sanchez is a 68 y.o. female who presents for Medicare Annual (Subsequent) preventive examination.  Visit Complete: In person  Patient Medicare AWV questionnaire was completed by the patient on 04/16/2023; I have confirmed that all information answered by patient is correct and no changes since this date.  Cardiac Risk Factors include: advanced age (>10men, >69 women);Other (see comment), Risk factor comments: Ovarian cancer on right     Objective:    Today's Vitals   04/20/23 1017  BP: 118/76  Pulse: 72  SpO2: 100%  Weight: 206 lb 9.6 oz (93.7 kg)  Height: 5\' 7"  (1.702 m)   Body mass index is 32.36 kg/m.     04/20/2023   10:22 AM 04/11/2018   11:53 AM 12/13/2017   11:14 AM 09/13/2017   11:44 AM 06/19/2017   12:04 PM 03/15/2017   12:09 PM 12/21/2016   11:02 AM  Advanced Directives  Does Patient Have a Medical Advance Directive? No No No No No No No  Would patient like information on creating a medical advance directive? Yes (MAU/Ambulatory/Procedural Areas - Information given) No - Patient declined Yes (MAU/Ambulatory/Procedural Areas - Information given) No - Patient declined Yes (MAU/Ambulatory/Procedural Areas - Information given) No - Patient declined Yes (MAU/Ambulatory/Procedural Areas - Information given)    Current Medications (verified) Outpatient Encounter Medications as of 04/20/2023  Medication Sig   ibuprofen (ADVIL,MOTRIN) 200 MG tablet Take 400 mg by mouth every 6 (six) hours as needed for headache. Reported on 07/01/2015   levothyroxine (SYNTHROID) 25 MCG tablet TAKE 1 TABLET BY MOUTH DAILY BEFORE BREAKFAST.   Multiple Vitamins-Minerals (CENTRUM VITAMINTS PO) Take 1 tablet by mouth daily.   rizatriptan (MAXALT) 5 MG tablet Take 1 tablet (5 mg total) by mouth as needed for migraine. May repeat in 2 hours if needed   No facility-administered encounter medications on file as of 04/20/2023.    Allergies (verified) Patient has no known  allergies.   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   Squamous cell carcinoma of right hand 2006   s/p excision   Past Surgical History:  Procedure Laterality Date   ABDOMINAL HYSTERECTOMY  04/09/2012   Procedure: HYSTERECTOMY ABDOMINAL;  Surgeon: Laurette Schimke, MD PHD;  Location: [facility];  Service: Gynecology;  Laterality: N/A;   BREAST ENHANCEMENT SURGERY  2000   LAPAROTOMY  04/09/2012   Procedure: EXPLORATORY LAPAROTOMY;  Surgeon: Laurette Schimke, MD PHD;  Location: [facility];  Service: Gynecology;  Laterality: N/A;  EXPLORATORY LAPAROTOMY, TOTAL ABDOMINAL HYESTERCTOMY, BILATERAL SALPINGO OOPHERECTOMY   mohs procedure to right side of base of nose      Family History  Problem Relation Age of Onset   Melanoma Mother 70       on her back; TAH/BSO in ~30 d/t bleeding   Rheum arthritis Mother    Macular degeneration Mother    Colon cancer Mother 30   Skin cancer Brother 65       squamous cell  Thyroid cancer Brother 73       s/p thyroidectomy   Diabetes Brother    Ovarian cancer Maternal Aunt 50       "abd ca" possible ovarian; deceased 45s   Migraines Son    Colon polyps Neg Hx    Esophageal cancer Neg Hx    Stomach cancer Neg Hx    Rectal cancer Neg Hx    Social History   Socioeconomic History   Marital status: Widowed    Spouse name: Not on file   Number of children: 3   Years of education: Not on file   Highest education level: Not on file  Occupational History   Occupation: RETIRED/ Part time at [employer]  Tobacco Use   Smoking status: Never   Smokeless tobacco: Never  Vaping Use    Vaping status: Never Used  Substance and Sexual Activity   Alcohol use: Yes    Alcohol/week: 2.0 standard drinks of alcohol    Types: 2 Standard drinks or equivalent per week    Comment: occasional to rare - beer    Drug use: No   Sexual activity: Yes  Other Topics Concern   Not on file  Social History Narrative   Lives with boyfriend   Moved to [place] from [place]   BA psyc from [employer]   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, work from home   Social Drivers of Health   Financial Resource Strain: Low Risk  (04/20/2023)   Overall Financial Resource Strain (CARDIA)    Difficulty of Paying Living Expenses: Not hard at all  Food Insecurity: No Food Insecurity (04/20/2023)   Hunger Vital Sign    Worried About Running Out of Food in the Last Year: Never true    Ran Out of Food in the Last Year: Never true  Transportation Needs: No Transportation Needs (04/20/2023)   PRAPARE - Administrator, Civil Service (Medical): No    Lack of Transportation (Non-Medical): No  Physical Activity: Inactive (04/20/2023)   Exercise Vital Sign    Days of Exercise per Week: 0 days    Minutes of Exercise per Session: 0 min  Stress: Stress Concern Present (04/20/2023)   [employer] of Occupational Health - Occupational Stress Questionnaire    Feeling of Stress : Rather much  Social Connections: Moderately Isolated (04/20/2023)   Social Connection and Isolation Panel [NHANES]    Frequency of Communication with Friends and Family: More than three times a week    Frequency of Social Gatherings with Friends and Family: Twice a week    Attends Religious Services: Never    Database administrator or Organizations: No    Attends Engineer, structural: Never    Marital Status: Living with partner    Tobacco Counseling Counseling given: Not Answered   Clinical Intake:  Pre-visit preparation completed: Yes  Pain : No/denies pain     BMI - recorded: 32.36 Nutritional  Status: BMI > 30  Obese Nutritional Risks: None Diabetes: No  How often do you need to have someone help you when you read instructions, pamphlets, or other written materials from your doctor or pharmacy?: 1 - Never     Information entered by :: Jayona Mccaig, RMA   Activities of Daily Living    04/16/2023    2:23 PM  In your present state of health, do you have any difficulty performing the following activities:  Hearing? 0  Vision? 0  Difficulty concentrating  or making decisions? 0  Walking or climbing stairs? 0  Dressing or bathing? 0  Doing errands, shopping? 0  Preparing Food and eating ? N  Using the Toilet? N  In the past six months, have you accidently leaked urine? Y  Do you have problems with loss of bowel control? N  Managing your Medications? N  Managing your Finances? N  Housekeeping or managing your Housekeeping? N    Patient Care Team: Pincus Sanes, MD as PCP - General (Internal Medicine) Laurette Schimke, MD (Gynecologic Oncology) Reece Packer, MD (Oncology)  Indicate any recent Medical Services you may have received from other than [facility] providers in the past year (date may be approximate).     Assessment:   This is a routine wellness examination for [place].  Hearing/Vision screen Hearing Screening - Comments:: Denies hearing difficulties   Vision Screening - Comments:: Wears eyeglasses    Goals Addressed               This Visit's Progress     Patient Stated (pt-stated)        Would like to lose weight and exercise more      Depression Screen    04/20/2023   10:24 AM 04/20/2022    1:16 PM 04/18/2021    2:46 PM 04/14/2019   11:01 AM 04/10/2018    1:41 PM 08/13/2017   10:06 AM  PHQ 2/9 Scores  PHQ - 2 Score 0 0 0 0 0 0  PHQ- 9 Score 3  1  0 6    Fall Risk    04/16/2023    2:23 PM 04/19/2022   10:46 AM 04/18/2021    1:41 PM 04/14/2019   11:00 AM 04/06/2014   10:03 AM  Fall Risk   Falls in the past year? 0 0 0 1 No  Number  falls in past yr:  0 0 0   Injury with Fall?  0 0    Risk for fall due to :  No Fall Risks No Fall Risks    Follow up Falls evaluation completed;Falls prevention discussed Falls evaluation completed Falls evaluation completed      MEDICARE RISK AT HOME: Medicare Risk at Home Any stairs in or around the home?: (Patient-Rptd) Yes If so, are there any without handrails?: (Patient-Rptd) No Home free of loose throw rugs in walkways, pet beds, electrical cords, etc?: (Patient-Rptd) Yes Adequate lighting in your home to reduce risk of falls?: (Patient-Rptd) Yes Life alert?: (Patient-Rptd) No Use of a cane, walker or w/c?: (Patient-Rptd) No Grab bars in the bathroom?: (Patient-Rptd) No Shower chair or bench in shower?: (Patient-Rptd) Yes Elevated toilet seat or a handicapped toilet?: (Patient-Rptd) Yes  TIMED UP AND GO:  Was the test performed?  Yes  Length of time to ambulate 10 feet: 15 sec Gait steady and fast without use of assistive device    Cognitive Function:        04/20/2023    8:43 AM  6CIT Screen  What Year? 0 points  What month? 0 points  What time? 0 points  Count back from 20 0 points  Months in reverse 0 points  Repeat phrase 0 points  Total Score 0 points    Immunizations Immunization History  Administered Date(s) Administered   Fluad Quad(high Dose 65+) 02/13/2020, 12/09/2020, 02/28/2022   Fluad Trivalent(High Dose 65+) 03/08/2023   HIB (PRP-T) 01/14/2015   Influenza Split 04/30/2012   Influenza, Seasonal, Injecte, Preservative Fre 10/12/2015   Influenza,inj,Quad PF,6+  Mos 01/19/2015, 02/21/2016, 04/09/2017, 01/17/2018, 12/18/2018   Meningococcal B, OMV 10/12/2015, 11/16/2015   Meningococcal polysaccharide vaccine (MPSV4) 01/14/2015   PFIZER(Purple Top)SARS-COV-2 Vaccination 06/13/2019, 07/08/2019, 04/03/2020   PNEUMOCOCCAL CONJUGATE-20 10/07/2020   Pneumococcal Conjugate-13 01/18/2016   Pneumococcal Polysaccharide-23 01/14/2015, 03/21/2016   Tdap  01/17/2018   Zoster Recombinant(Shingrix) 10/07/2020, 12/09/2020    TDAP status: Up to date  Flu Vaccine status: Up to date  Pneumococcal vaccine status: Up to date  Covid-19 vaccine status: Declined, Education has been provided regarding the importance of this vaccine but patient still declined. Advised may receive this vaccine at local pharmacy or Health Dept.or vaccine clinic. Aware to provide a copy of the vaccination record if obtained from local pharmacy or Health Dept. Verbalized acceptance and understanding.  Qualifies for Shingles Vaccine? Yes   Zostavax completed Yes   Shingrix Completed?: Yes  Screening Tests Health Maintenance  Topic Date Due   COVID-19 Vaccine (4 - 2024-25 season) 11/26/2022   DEXA SCAN  05/19/2023   Colonoscopy  10/16/2023   Medicare Annual Wellness (AWV)  04/19/2024   MAMMOGRAM  04/09/2025   DTaP/Tdap/Td (2 - Td or Tdap) 01/18/2028   Pneumonia Vaccine 17+ Years old  Completed   INFLUENZA VACCINE  Completed   Hepatitis C Screening  Completed   Zoster Vaccines- Shingrix  Completed   HPV VACCINES  Aged Out    Health Maintenance  Health Maintenance Due  Topic Date Due   COVID-19 Vaccine (4 - 2024-25 season) 11/26/2022    Colorectal cancer screening: Type of screening: Colonoscopy. Completed 10/16/2018. Repeat every 5 years  Mammogram status: Completed 01/14/*2025. Repeat every year  Bone Density status: Completed 05/18/2020. Results reflect: Bone density results: OSTEOPENIA. Repeat every 2 years.  Lung Cancer Screening: (Low Dose CT Chest recommended if Age 75-80 years, 20 pack-year currently smoking OR have quit w/in 15years.) does not qualify.   Lung Cancer Screening Referral: N/A  Additional Screening:  Hepatitis C Screening: does qualify; Completed 01/11/2015  Vision Screening: Recommended annual ophthalmology exams for early detection of glaucoma and other disorders of the eye. Is the patient up to date with their annual eye exam?   Yes  Who is the provider or what is the name of the office in which the patient attends annual eye exams? Dr. Cathey Endow If pt is not established with a provider, would they like to be referred to a provider to establish care? No .   Dental Screening: Recommended annual dental exams for proper oral hygiene   Community Resource Referral / Chronic Care Management: CRR required this visit?  No   CCM required this visit?  No     Plan:     I have personally reviewed and noted the following in the patient's chart:   Medical and social history Use of alcohol, tobacco or illicit drugs  Current medications and supplements including opioid prescriptions. Patient is not currently taking opioid prescriptions. Functional ability and status Nutritional status Physical activity Advanced directives List of other physicians Hospitalizations, surgeries, and ER visits in previous 12 months Vitals Screenings to include cognitive, depression, and falls Referrals and appointments  In addition, I have reviewed and discussed with patient certain preventive protocols, quality metrics, and best practice recommendations. A written personalized care plan for preventive services as well as general preventive health recommendations were provided to patient.     Shelley Sanchez, CMA   04/20/2023   After Visit Summary: (MyChart) Due to this being a telephonic visit, the after visit summary with  patients personalized plan was offered to patient via MyChart   Nurse Notes: Patient is due for a DEXA and order has been placed.  She has a concern about feeling anxious when driving, noticing it getting stronger since the new year.  Patient would like to discuss further during her office visit with Dr. Lawerance Bach today.  She had no other concerns to address today.

## 2023-04-20 NOTE — Assessment & Plan Note (Addendum)
Chronic Often triggered by weather Continue maxalt 5 mg prn Taking excedrin migraine as needed

## 2023-04-20 NOTE — Assessment & Plan Note (Signed)
Chronic Taking vitamin d daily Check vitamin d level

## 2023-04-20 NOTE — Assessment & Plan Note (Signed)
Chronic Lab Results  Component Value Date   HGBA1C 5.9 04/19/2022   Check a1c Low sugar / carb diet Stressed regular exercise

## 2023-04-20 NOTE — Patient Instructions (Addendum)
Shelley Sanchez , Thank you for taking time to come for your Medicare Wellness Visit. I appreciate your ongoing commitment to your health goals. Please review the following plan we discussed and let me know if I can assist you in the future.   Referrals/Orders/Follow-Ups/Clinician Recommendations: It was nice to meet you today.  Aim for 30 minutes of exercise or brisk walking, 6-8 glasses of water, and 5 servings of fruits and vegetables each day. You have an order for:   [x]   Bone Density     Please call for appointment:  [facility] [address] [place], [place] [address] [phone]    Make sure to wear two-piece clothing.  No lotions, powders, or deodorants the day of the appointment. Make sure to bring picture ID and insurance card.  Bring list of medications you are currently taking including any supplements.   Schedule your [place] screening mammogram through MyChart!   Log into your MyChart account.  Go to 'Visit' (or 'Appointments' if on mobile App) --> Schedule an Appointment  Under 'Select a Reason for Visit' choose the Mammogram Screening option.  Complete the pre-visit questions and select the time and place that best fits your schedule.     This is a list of the screening recommended for you and due dates:  Health Maintenance  Topic Date Due   COVID-19 Vaccine (4 - 2024-25 season) 11/26/2022   DEXA scan (bone density measurement)  05/19/2023   Colon Cancer Screening  10/16/2023   Medicare Annual Wellness Visit  04/19/2024   Mammogram  04/09/2025   DTaP/Tdap/Td vaccine (2 - Td or Tdap) 01/18/2028   Pneumonia Vaccine  Completed   Flu Shot  Completed   Hepatitis C Screening  Completed   Zoster (Shingles) Vaccine  Completed   HPV Vaccine  Aged Out    Advanced directives: (Copy Requested) Please bring a copy of your health care power of attorney and living will to the office to be added to your chart at your convenience.  Next  Medicare Annual Wellness Visit scheduled for next year: Yes

## 2023-04-20 NOTE — Assessment & Plan Note (Signed)
Chronic Dexa due soon-ordered Stressed regular exercise Advised calcium and vitamin d daily advised to try to get most of her calcium through diet

## 2023-04-20 NOTE — Assessment & Plan Note (Signed)
New Over the past year she has found herself starting to get anxious with driving-whenever she is going fast or there is a lot of traffic She is not sure why this started if it was different events that caused it, but there is no specific cause She denies generalized anxiety She is concerned that this might affect her ability to drive which she does want to continue doing Discussed options She deferred therapy at this time, but we could consider this in the near future Will start sertraline 50 mg daily and have her follow-up in 2 months Discussed I do not think would be a good idea to take an as needed medication or short acting medication because of the possibility of addiction and drowsiness

## 2023-04-21 ENCOUNTER — Encounter: Payer: Self-pay | Admitting: Internal Medicine

## 2023-04-25 ENCOUNTER — Encounter: Payer: Self-pay | Admitting: Internal Medicine

## 2023-05-12 ENCOUNTER — Other Ambulatory Visit: Payer: Self-pay | Admitting: Internal Medicine

## 2023-05-19 ENCOUNTER — Encounter: Payer: Self-pay | Admitting: Internal Medicine

## 2023-06-17 NOTE — Progress Notes (Unsigned)
 Subjective:    Patient ID: Shelley Sanchez, female    DOB: 31-Oct-1955, 68 y.o.   MRN: [id number]     HPI Shelley Sanchez is here for follow up of her chronic medical problems.  Follow up of anxiety - started on sertraline two months ago.  Anxiety with driving fast on in a lot of traffic.      Sertraline has helped.  She does feel she was having panic attacks  -- she still feels mild anxiety when driving in certain circumstances - she will start talking loudly and it helps.  When she for started medication she did have a little nausea and maybe headaches, but those resolved.  She a little more energy and does not watch as much TV as she used to.  She wonders if she was a little depressed and did not realize it.     Medications and allergies reviewed with patient and updated if appropriate.  Current Outpatient Medications on File Prior to Visit  Medication Sig Dispense Refill   ibuprofen (ADVIL,MOTRIN) 200 MG tablet Take 400 mg by mouth every 6 (six) hours as needed for headache. Reported on 07/01/2015     levothyroxine (SYNTHROID) 25 MCG tablet TAKE 1 TABLET BY MOUTH DAILY BEFORE BREAKFAST. 90 tablet 2   Multiple Vitamins-Minerals (CENTRUM VITAMINTS PO) Take 1 tablet by mouth daily.     rizatriptan (MAXALT) 5 MG tablet Take 1 tablet (5 mg total) by mouth as needed for migraine. May repeat in 2 hours if needed 10 tablet 11   sertraline (ZOLOFT) 50 MG tablet TAKE 1 TABLET BY MOUTH EVERY DAY 90 tablet 2   No current facility-administered medications on file prior to visit.     Review of Systems     Objective:   Vitals:   06/21/23 1054  BP: 126/74  Pulse: 79  Resp: 16  SpO2: 98%   BP Readings from Last 3 Encounters:  06/21/23 126/74  04/20/23 112/76  04/20/23 118/76   Wt Readings from Last 3 Encounters:  06/21/23 199 lb (90.3 kg)  04/20/23 205 lb 9.6 oz (93.3 kg)  04/20/23 206 lb 9.6 oz (93.7 kg)   Body mass index is 31.17 kg/m.    Physical Exam Constitutional:       General: She is not in acute distress.    Appearance: Normal appearance. She is not ill-appearing.  HENT:     Head: Normocephalic and atraumatic.  Skin:    General: Skin is warm and dry.  Neurological:     Mental Status: She is alert. Mental status is at baseline.  Psychiatric:        Mood and Affect: Mood normal.        Behavior: Behavior normal.        Thought Content: Thought content normal.        Judgment: Judgment normal.        Lab Results  Component Value Date   WBC 8.1 04/20/2023   HGB 14.4 04/20/2023   HCT 43.3 04/20/2023   PLT 342.0 04/20/2023   GLUCOSE 103 (H) 04/20/2023   CHOL 214 (H) 04/20/2023   TRIG 138.0 04/20/2023   HDL 54.40 04/20/2023   LDLCALC 132 (H) 04/20/2023   ALT 20 04/20/2023   AST 22 04/20/2023   NA 140 04/20/2023   K 4.4 04/20/2023   CL 101 04/20/2023   CREATININE 0.73 04/20/2023   BUN 13 04/20/2023   CO2 28 04/20/2023   TSH 4.46 04/20/2023  INR 1.04 01/26/2015   HGBA1C 6.1 04/20/2023     Assessment & Plan:    See Problem List for Assessment and Plan of chronic medical problems.

## 2023-06-17 NOTE — Patient Instructions (Addendum)
      Blood work was ordered.       Medications changes include :   None    A referral was ordered and someone will call you to schedule an appointment.     Return in about 6 months (around 12/22/2023) for follow up.

## 2023-06-21 ENCOUNTER — Ambulatory Visit: Payer: Medicare Other | Admitting: Internal Medicine

## 2023-06-21 ENCOUNTER — Encounter: Payer: Self-pay | Admitting: Internal Medicine

## 2023-06-21 VITALS — BP 126/74 | HR 79 | Resp 16 | Ht 67.0 in | Wt 199.0 lb

## 2023-06-21 DIAGNOSIS — F419 Anxiety disorder, unspecified: Secondary | ICD-10-CM

## 2023-06-21 MED ORDER — SERTRALINE HCL 100 MG PO TABS: 100.0000 mg | ORAL_TABLET | Freq: Every day | ORAL | 5 refills | Status: DC

## 2023-06-21 NOTE — Assessment & Plan Note (Addendum)
 Improved Was getting very anxious when driving on the highway or when there was a lot of traffic 50 mg of sertraline daily has helped She still gets a little bit anxious and usually can deal with this by talking mildly while she is driving until the anxiety passes little bit Will try increasing sertraline to 100 mg daily to see if that is more effective

## 2023-07-18 ENCOUNTER — Other Ambulatory Visit: Payer: Self-pay | Admitting: Internal Medicine

## 2023-08-20 ENCOUNTER — Other Ambulatory Visit: Payer: Self-pay | Admitting: Internal Medicine

## 2023-12-05 ENCOUNTER — Other Ambulatory Visit (HOSPITAL_BASED_OUTPATIENT_CLINIC_OR_DEPARTMENT_OTHER)

## 2023-12-17 ENCOUNTER — Other Ambulatory Visit: Payer: Medicare Other

## 2024-02-25 ENCOUNTER — Telehealth: Payer: Self-pay

## 2024-02-25 ENCOUNTER — Other Ambulatory Visit: Payer: Self-pay

## 2024-02-25 ENCOUNTER — Other Ambulatory Visit: Payer: Self-pay | Admitting: Internal Medicine

## 2024-02-25 DIAGNOSIS — Z1211 Encounter for screening for malignant neoplasm of colon: Secondary | ICD-10-CM

## 2024-02-25 DIAGNOSIS — Z1231 Encounter for screening mammogram for malignant neoplasm of breast: Secondary | ICD-10-CM

## 2024-02-25 NOTE — Telephone Encounter (Signed)
 Copied from CRM #[id number]. Topic: Referral - Request for Referral >> Feb 25, 2024  1:29 PM Zebedee SAUNDERS wrote: Did the patient discuss referral with their provider in the last year? Yes (If No - schedule appointment) (If Yes - send message)  Appointment offered? Yes  Type of order/referral and detailed reason for visit: Colonoscopy  Preference of office, provider, location: Pt's area  If referral order, have you been seen by this specialty before? No (If Yes, this issue or another issue? When? Where?  Can we respond through MyChart? Yes

## 2024-02-25 NOTE — Telephone Encounter (Signed)
 Referral placed today.

## 2024-02-27 NOTE — Telephone Encounter (Unsigned)
 Copied from CRM [id number]. Topic: Appointments - Scheduling Inquiry for Clinic >> Feb 27, 2024  3:14 PM Mercedes MATSU wrote: Reason for CRM: Patient is requesting a call back to assist with scheduling her colonoscopy. Patient can be reached at [phone]

## 2024-02-28 ENCOUNTER — Ambulatory Visit

## 2024-02-28 DIAGNOSIS — Z23 Encounter for immunization: Secondary | ICD-10-CM

## 2024-02-28 NOTE — Progress Notes (Signed)
 Pt was given HD flu vaccine w/o any complications at this time.

## 2024-03-05 ENCOUNTER — Encounter: Payer: Self-pay | Admitting: Internal Medicine

## 2024-03-13 ENCOUNTER — Encounter

## 2024-03-13 ENCOUNTER — Ambulatory Visit

## 2024-03-13 VITALS — Ht 67.0 in | Wt 200.0 lb

## 2024-03-13 DIAGNOSIS — Z860100 Personal history of colon polyps, unspecified: Secondary | ICD-10-CM

## 2024-03-13 NOTE — Progress Notes (Signed)

## 2024-04-03 ENCOUNTER — Encounter: Payer: Self-pay | Admitting: Internal Medicine

## 2024-04-03 ENCOUNTER — Ambulatory Visit (AMBULATORY_SURGERY_CENTER): Admitting: Internal Medicine

## 2024-04-03 VITALS — BP 102/56 | HR 70 | Temp 97.3°F | Resp 20 | Ht 67.0 in | Wt 200.0 lb

## 2024-04-03 DIAGNOSIS — K648 Other hemorrhoids: Secondary | ICD-10-CM | POA: Diagnosis not present

## 2024-04-03 DIAGNOSIS — Z1211 Encounter for screening for malignant neoplasm of colon: Secondary | ICD-10-CM | POA: Diagnosis not present

## 2024-04-03 DIAGNOSIS — K5730 Diverticulosis of large intestine without perforation or abscess without bleeding: Secondary | ICD-10-CM

## 2024-04-03 DIAGNOSIS — Z800 Family history of malignant neoplasm of digestive organs: Secondary | ICD-10-CM | POA: Diagnosis not present

## 2024-04-03 DIAGNOSIS — Z860100 Personal history of colon polyps, unspecified: Secondary | ICD-10-CM

## 2024-04-03 MED ORDER — SODIUM CHLORIDE 0.9 % IV SOLN: 500.0000 mL | Freq: Once | INTRAVENOUS | Status: DC

## 2024-04-03 NOTE — Progress Notes (Signed)
 Report given to PACU, vss

## 2024-04-03 NOTE — Progress Notes (Signed)
 Pt's states no medical or surgical changes since previsit or office visit.

## 2024-04-03 NOTE — Op Note (Signed)
 [facility] Patient Name: Shelley Sanchez Procedure Date: 04/03/2024 9:27 AM MRN: [id number] Endoscopist: Rosario Estefana Kidney , , [id number] Age: 69 Referring MD:  Date of Birth: Jul 11, 1955 Gender: Female Account #: [id number] Procedure:                Colonoscopy Indications:              High risk colon cancer surveillance: Personal                            history of colonic polyps Medicines:                Monitored Anesthesia Care Procedure:                Pre-Anesthesia Assessment:                           - Prior to the procedure, a History and Physical                            was performed, and patient medications and                            allergies were reviewed. The patient's tolerance of                            previous anesthesia was also reviewed. The risks                            and benefits of the procedure and the sedation                            options and risks were discussed with the patient.                            All questions were answered, and informed consent                            was obtained. Prior Anticoagulants: The patient has                            taken no anticoagulant or antiplatelet agents. ASA                            Grade Assessment: II - A patient with mild systemic                            disease. After reviewing the risks and benefits,                            the patient was deemed in satisfactory condition to                            undergo the procedure.  After obtaining informed consent, the colonoscope                            was passed under direct vision. Throughout the                            procedure, the patient's blood pressure, pulse, and                            oxygen saturations were monitored continuously. The                            Olympus Scope SN: [id number] was introduced through                            the anus and advanced to the the  terminal ileum.                            The colonoscopy was performed without difficulty.                            The patient tolerated the procedure well. The                            quality of the bowel preparation was excellent. The                            terminal ileum, ileocecal valve, appendiceal                            orifice, and rectum were photographed. Scope In: 9:50:45 AM Scope Out: 10:07:01 AM Scope Withdrawal Time: 0 hours 10 minutes 41 seconds  Total Procedure Duration: 0 hours 16 minutes 16 seconds  Findings:                 The terminal ileum appeared normal.                           Multiple diverticula were found in the sigmoid                            colon.                           Non-bleeding internal hemorrhoids were found during                            retroflexion. Complications:            No immediate complications. Estimated Blood Loss:     Estimated blood loss: none. Impression:               - The examined portion of the ileum was normal.                           - Diverticulosis in the sigmoid colon.                           -  Non-bleeding internal hemorrhoids.                           - No specimens collected. Recommendation:           - Discharge patient to home (with escort).                           - Repeat colonoscopy in 5 years for screening                            purposes.                           - The findings and recommendations were discussed                            with the patient. Dr Estefana Federico Rosario Estefana Federico,  04/03/2024 10:10:08 AM

## 2024-04-03 NOTE — Progress Notes (Signed)
 "   GASTROENTEROLOGY PROCEDURE H&P NOTE   Primary Care Physician: Geofm Glade PARAS, MD    Reason for Procedure:   History of colon polyps, family history of colon cancer  Plan:    Colonoscopy  Patient is appropriate for endoscopic procedure(s) in the ambulatory ([facility]) setting.  The nature of the procedure, as well as the risks, benefits, and alternatives were carefully and thoroughly reviewed with the patient. Ample time for discussion and questions allowed. The patient understood, was satisfied, and agreed to proceed.     HPI: Shelley Sanchez is a 69 y.o. female who presents for colonoscopy for history of colon polyps. Has had scant amounts of rectal bleeding just recently. Denies changes in bowel habits or unintentional weight loss. Mother with colon cancer at age 75.  Colonoscopy 10/16/18: - Diverticulosis in the sigmoid colon. - One 3 mm polyp in the transverse colon, removed with a cold snare. Resected and retrieved. - One 1 mm polyp in the descending colon, removed with a cold biopsy forceps. Resected and retrieved. - One 2 mm polyp in the distal descending colon, removed with a cold snare. Resected and retrieved. - One 8- 9 mm polyp in the sigmoid colon, removed with a cold snare. Resected and retrieved. - The examination was otherwise normal on direct and retroflexion views. Path: One tubular adenoma. Rest were hyperplastic.  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   Squamous cell carcinoma of right hand 2006   s/p excision    Past Surgical History:  Procedure Laterality Date   ABDOMINAL HYSTERECTOMY   04/09/2012   Procedure: HYSTERECTOMY ABDOMINAL;  Surgeon: Sari Bachelor, MD PHD;  Location: [facility];  Service: Gynecology;  Laterality: N/A;   BREAST ENHANCEMENT SURGERY  2000   LAPAROTOMY  04/09/2012   Procedure: EXPLORATORY LAPAROTOMY;  Surgeon: Sari Bachelor, MD PHD;  Location: [facility];  Service: Gynecology;  Laterality: N/A;  EXPLORATORY LAPAROTOMY, TOTAL ABDOMINAL HYESTERCTOMY, BILATERAL SALPINGO OOPHERECTOMY   mohs procedure to right side of base of nose       Prior to Admission medications  Medication Sig Start Date End Date Taking? Authorizing Provider  Cyanocobalamin (VITAMIN B 12 PO) Take 1 capsule by mouth daily.   Yes [provider]  fluorouracil (EFUDEX) 5 % cream Apply 1 Application topically 2 (two) times daily. 02/26/24  Yes [provider]  levothyroxine  (SYNTHROID ) 25 MCG tablet TAKE 1 TABLET BY MOUTH DAILY BEFORE BREAKFAST. 08/21/23  Yes Burns, Glade PARAS, MD  magnesium 30 MG tablet Take 30 mg by mouth daily.   Yes [provider]  Multiple Vitamins-Minerals (CENTRUM VITAMINTS PO) Take 1 tablet by mouth daily.   Yes [provider]  sertraline  (ZOLOFT ) 100 MG tablet TAKE 1 TABLET BY MOUTH EVERY DAY 07/18/23  Yes Burns, Glade PARAS, MD  VITAMIN D , CHOLECALCIFEROL, PO Take 1 capsule by mouth daily. 09/11/11  Yes [provider]  aspirin -acetaminophen -caffeine  (EXCEDRIN  MIGRAINE) 250-250-65 MG tablet Take 2 tablets by mouth every 6 (six) hours as  needed for headache.    [provider]  DHA-EPA-Vitamin E (OMEGA-3 COMPLEX PO) Take 2 capsules by mouth daily.    [provider]  ibuprofen  (ADVIL ,MOTRIN ) 200 MG tablet Take 400 mg by mouth every 6 (six) hours as needed for headache. Reported on 07/01/2015    [provider]  Multiple Vitamins-Minerals (HAIR SKIN & NAILS PO) Take 2 tablets by mouth daily.    [provider]  rizatriptan  (MAXALT ) 5 MG tablet Take 1 tablet (5 mg total) by mouth as needed for migraine. May  repeat in 2 hours if needed 04/19/22   Geofm Glade PARAS, MD    Current Outpatient Medications  Medication Sig Dispense Refill   Cyanocobalamin (VITAMIN B 12 PO) Take 1 capsule by mouth daily.     fluorouracil (EFUDEX) 5 % cream Apply 1 Application topically 2 (two) times daily.     levothyroxine  (SYNTHROID ) 25 MCG tablet TAKE 1 TABLET BY MOUTH DAILY BEFORE BREAKFAST. 90 tablet 2   magnesium 30 MG tablet Take 30 mg by mouth daily.     Multiple Vitamins-Minerals (CENTRUM VITAMINTS PO) Take 1 tablet by mouth daily.     sertraline  (ZOLOFT ) 100 MG tablet TAKE 1 TABLET BY MOUTH EVERY DAY 90 tablet 2   VITAMIN D , CHOLECALCIFEROL, PO Take 1 capsule by mouth daily.     aspirin -acetaminophen -caffeine  (EXCEDRIN  MIGRAINE) 250-250-65 MG tablet Take 2 tablets by mouth every 6 (six) hours as needed for headache.     DHA-EPA-Vitamin E (OMEGA-3 COMPLEX PO) Take 2 capsules by mouth daily.     ibuprofen  (ADVIL ,MOTRIN ) 200 MG tablet Take 400 mg by mouth every 6 (six) hours as needed for headache. Reported on 07/01/2015     Multiple Vitamins-Minerals (HAIR SKIN & NAILS PO) Take 2 tablets by mouth daily.     rizatriptan  (MAXALT ) 5 MG tablet Take 1 tablet (5 mg total) by mouth as needed for migraine. May repeat in 2 hours if needed 10 tablet 11   Current Facility-Administered Medications  Medication Dose Route Frequency Provider Last Rate Last Admin   0.9 %  sodium chloride  infusion  500 mL Intravenous Once Carmel Garfield C, MD        Allergies as of 04/03/2024   (No Known Allergies)    Family History  Problem Relation Age of Onset   Melanoma Mother 29       on her back; TAH/BSO in ~30 d/t bleeding   Rheum arthritis Mother    Macular degeneration Mother    Colon cancer Mother 62   Skin cancer Brother 30       squamous cell   Thyroid  cancer Brother 31       s/p thyroidectomy   Diabetes Brother    Ovarian cancer Maternal Aunt 50       abd ca possible ovarian; deceased 59s   Migraines Son    Colon polyps  Neg Hx    Esophageal cancer Neg Hx    Stomach cancer Neg Hx    Rectal cancer Neg Hx     Social History   Socioeconomic History   Marital status: Widowed    Spouse name: Not on file   Number of children: 3   Years of education: Not on file   Highest education level: Not on file  Occupational History   Occupation: RETIRED/ Part time at [employer]  Tobacco Use   Smoking status: Never   Smokeless tobacco: Never  Vaping Use   Vaping status: Never Used  Substance  and Sexual Activity   Alcohol use: Not Currently    Alcohol/week: 2.0 standard drinks of alcohol    Types: 2 Standard drinks or equivalent per week   Drug use: No   Sexual activity: Yes  Other Topics Concern   Not on file  Social History Narrative   Lives with boyfriend   Moved to [place] from [place]   BA psyc from [employer]   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, work from home   Social Drivers of Health   Tobacco Use: Low Risk (04/03/2024)   Patient History    Smoking Tobacco Use: Never    Smokeless Tobacco Use: Never    Passive Exposure: Not on file  Financial Resource Strain: Low Risk (04/20/2023)   Overall Financial Resource Strain (CARDIA)    Difficulty of Paying Living Expenses: Not hard at all  Food Insecurity: No Food Insecurity (04/20/2023)   Hunger Vital Sign    Worried About Running Out of Food in the Last Year: Never true    Ran Out of Food in the Last Year: Never true  Transportation Needs: No Transportation Needs (04/20/2023)   PRAPARE - Administrator, Civil Service (Medical): No    Lack of Transportation (Non-Medical): No  Physical Activity: Inactive (04/20/2023)   Exercise Vital Sign    Days of Exercise per Week: 0 days    Minutes of Exercise per Session: 0 min  Stress: Stress Concern Present (04/20/2023)   [employer] of Occupational Health - Occupational Stress Questionnaire    Feeling of Stress : Rather much  Social Connections: Moderately Isolated (04/20/2023)   Social  Connection and Isolation Panel    Frequency of Communication with Friends and Family: More than three times a week    Frequency of Social Gatherings with Friends and Family: Twice a week    Attends Religious Services: Never    Database Administrator or Organizations: No    Attends Banker Meetings: Never    Marital Status: Living with partner  Intimate Partner Violence: Not At Risk (04/20/2023)   Humiliation, Afraid, Rape, and Kick questionnaire    Fear of Current or Ex-Partner: No    Emotionally Abused: No    Physically Abused: No    Sexually Abused: No  Depression (PHQ2-9): Low Risk (04/20/2023)   Depression (PHQ2-9)    PHQ-2 Score: 3  Alcohol Screen: Low Risk (04/20/2023)   Alcohol Screen    Last Alcohol Screening Score (AUDIT): 0  Housing: Unknown (04/20/2023)   Housing Stability Vital Sign    Unable to Pay for Housing in the Last Year: No    Number of Times Moved in the Last Year: Not on file    Homeless in the Last Year: No  Utilities: Not At Risk (04/20/2023)   AHC Utilities    Threatened with loss of utilities: No  Health Literacy: Adequate Health Literacy (04/20/2023)   B1300 Health Literacy    Frequency of need for help with medical instructions: Never    Physical Exam: Vital signs in last 24 hours: BP 123/72   Pulse 80   Temp (!) 97.3 F (36.3 C)   Ht 5' 7 (1.702 m)   Wt 200 lb (90.7 kg)   SpO2 96%   BMI 31.32 kg/m  GEN: NAD EYE: Sclerae anicteric ENT: MMM CV: Non-tachycardic Pulm: No increased work of breathing GI: Soft, NT/ND NEURO:  Alert & Oriented   Estefana Kidney, MD [facility]  04/03/2024 9:28 AM  "

## 2024-04-03 NOTE — Patient Instructions (Signed)
 YOU HAD AN ENDOSCOPIC PROCEDURE TODAY AT THE [facility]:   Refer to the procedure report that was given to you for any specific questions about what was found during the examination.  If the procedure report does not answer your questions, please call your gastroenterologist to clarify.  If you requested that your care partner not be given the details of your procedure findings, then the procedure report has been included in a sealed envelope for you to review at your convenience later.  Thank you for letting us  take care of your healthcare needs today. Please see handouts given to you on Diverticulosis.    YOU SHOULD EXPECT: Some feelings of bloating in the abdomen. Passage of more gas than usual.  Walking can help get rid of the air that was put into your GI tract during the procedure and reduce the bloating. If you had a lower endoscopy (such as a colonoscopy or flexible sigmoidoscopy) you may notice spotting of blood in your stool or on the toilet paper. If you underwent a bowel prep for your procedure, you may not have a normal bowel movement for a few days.  Please Note:  You might notice some irritation and congestion in your nose or some drainage.  This is from the oxygen used during your procedure.  There is no need for concern and it should clear up in a day or so.  SYMPTOMS TO REPORT IMMEDIATELY:  Following lower endoscopy (colonoscopy or flexible sigmoidoscopy):  Excessive amounts of blood in the stool  Significant tenderness or worsening of abdominal pains  Swelling of the abdomen that is new, acute  Fever of 100F or higher   For urgent or emergent issues, a gastroenterologist can be reached at any hour by calling [phone]. Do not use MyChart messaging for urgent concerns.    DIET:  We do recommend a small meal at first, but then you may proceed to your regular diet.  Drink plenty of fluids but you should avoid alcoholic beverages for 24 hours.  ACTIVITY:   You should plan to take it easy for the rest of today and you should NOT DRIVE or use heavy machinery until tomorrow (because of the sedation medicines used during the test).    FOLLOW UP: Our staff will call the number listed on your records the next business day following your procedure.  We will call around 7:15- 8:00 am to check on you and address any questions or concerns that you may have regarding the information given to you following your procedure. If we do not reach you, we will leave a message.     If any biopsies were taken you will be contacted by phone or by letter within the next 1-3 weeks.  Please call us  at [phone] if you have not heard about the biopsies in 3 weeks.    SIGNATURES/CONFIDENTIALITY: You and/or your care partner have signed paperwork which will be entered into your electronic medical record.  These signatures attest to the fact that that the information above on your After Visit Summary has been reviewed and is understood.  Full responsibility of the confidentiality of this discharge information lies with you and/or your care-partner.

## 2024-04-04 ENCOUNTER — Telehealth: Payer: Self-pay

## 2024-04-04 NOTE — Telephone Encounter (Signed)
" °  Follow up Call-     04/03/2024    8:49 AM  Call back number  Post procedure Call Back phone  # [phone]  Permission to leave phone message Yes     Patient questions:  Do you have a fever, pain , or abdominal swelling? No. Pain Score  0 *  Have you tolerated food without any problems? Yes.    Have you been able to return to your normal activities? Yes.    Do you have any questions about your discharge instructions: Diet   No. Medications  No. Follow up visit  No.  Do you have questions or concerns about your Care? No.  Actions: * If pain score is 4 or above: No action needed, pain <4.   "

## 2024-04-06 ENCOUNTER — Encounter: Payer: Self-pay | Admitting: Internal Medicine

## 2024-04-06 NOTE — Progress Notes (Unsigned)
 "     Subjective:    Patient ID: Shelley Sanchez, female    DOB: 02/26/1956, 69 y.o.   MRN: [id number]     HPI Shelley Sanchez is here for follow up of her chronic medical problems.  No concerns.    Started at the gym.  She is also starting to eat more healthy.  Medications and allergies reviewed with patient and updated if appropriate.  Medications Ordered Prior to Encounter[1]   Review of Systems  Constitutional:  Negative for fever.  Respiratory:  Positive for cough (chronic). Negative for shortness of breath and wheezing.   Cardiovascular:  Negative for chest pain, palpitations and leg swelling.  Gastrointestinal:  Positive for constipation (occ). Negative for abdominal pain and diarrhea.       No gerd  Neurological:  Positive for headaches. Negative for light-headedness.  Psychiatric/Behavioral:  Negative for dysphoric mood and sleep disturbance. The patient is not nervous/anxious.        Objective:   Vitals:   04/07/24 1414  BP: 108/78  Pulse: 85  Temp: 98.1 F (36.7 C)  SpO2: 90%   BP Readings from Last 3 Encounters:  04/07/24 108/78  04/03/24 (!) 102/56  06/21/23 126/74   Wt Readings from Last 3 Encounters:  04/07/24 203 lb (92.1 kg)  04/03/24 200 lb (90.7 kg)  03/13/24 200 lb (90.7 kg)   Body mass index is 31.79 kg/m.    Physical Exam Constitutional:      General: She is not in acute distress.    Appearance: Normal appearance.  HENT:     Head: Normocephalic and atraumatic.  Eyes:     Conjunctiva/sclera: Conjunctivae normal.  Cardiovascular:     Rate and Rhythm: Normal rate and regular rhythm.     Heart sounds: Normal heart sounds.  Pulmonary:     Effort: Pulmonary effort is normal. No respiratory distress.     Breath sounds: Normal breath sounds. No wheezing.  Abdominal:     General: There is no distension.     Palpations: Abdomen is soft.     Tenderness: There is no abdominal tenderness.  Musculoskeletal:     Cervical back: Neck supple.      Right lower leg: No edema.     Left lower leg: No edema.  Lymphadenopathy:     Cervical: No cervical adenopathy.  Skin:    General: Skin is warm and dry.     Findings: No rash.  Neurological:     Mental Status: She is alert. Mental status is at baseline.  Psychiatric:        Mood and Affect: Mood normal.        Behavior: Behavior normal.        Lab Results  Component Value Date   WBC 8.1 04/20/2023   HGB 14.4 04/20/2023   HCT 43.3 04/20/2023   PLT 342.0 04/20/2023   GLUCOSE 103 (H) 04/20/2023   CHOL 214 (H) 04/20/2023   TRIG 138.0 04/20/2023   HDL 54.40 04/20/2023   LDLCALC 132 (H) 04/20/2023   ALT 20 04/20/2023   AST 22 04/20/2023   NA 140 04/20/2023   K 4.4 04/20/2023   CL 101 04/20/2023   CREATININE 0.73 04/20/2023   BUN 13 04/20/2023   CO2 28 04/20/2023   TSH 4.46 04/20/2023   INR 1.04 01/26/2015   HGBA1C 6.1 04/20/2023     Assessment & Plan:    See Problem List for Assessment and Plan of chronic medical problems.        [  1]  Current Outpatient Medications on File Prior to Visit  Medication Sig Dispense Refill   aspirin -acetaminophen -caffeine  (EXCEDRIN  MIGRAINE) 250-250-65 MG tablet Take 2 tablets by mouth every 6 (six) hours as needed for headache.     Cyanocobalamin (VITAMIN B 12 PO) Take 1 capsule by mouth daily.     DHA-EPA-Vitamin E (OMEGA-3 COMPLEX PO) Take 2 capsules by mouth daily.     fluorouracil (EFUDEX) 5 % cream Apply 1 Application topically 2 (two) times daily.     ibuprofen  (ADVIL ,MOTRIN ) 200 MG tablet Take 400 mg by mouth every 6 (six) hours as needed for headache. Reported on 07/01/2015     levothyroxine  (SYNTHROID ) 25 MCG tablet TAKE 1 TABLET BY MOUTH DAILY BEFORE BREAKFAST. 90 tablet 2   magnesium 30 MG tablet Take 30 mg by mouth daily.     Multiple Vitamins-Minerals (CENTRUM VITAMINTS PO) Take 1 tablet by mouth daily.     Multiple Vitamins-Minerals (HAIR SKIN & NAILS PO) Take 2 tablets by mouth daily.     rizatriptan  (MAXALT ) 5 MG  tablet Take 1 tablet (5 mg total) by mouth as needed for migraine. May repeat in 2 hours if needed 10 tablet 11   sertraline  (ZOLOFT ) 100 MG tablet TAKE 1 TABLET BY MOUTH EVERY DAY 90 tablet 2   VITAMIN D , CHOLECALCIFEROL, PO Take 1 capsule by mouth daily.     No current facility-administered medications on file prior to visit.   "

## 2024-04-06 NOTE — Patient Instructions (Addendum)
 "  Get both meningitis vaccines.     Blood work was ordered.       Medications changes include :   None      Return in about 6 months (around 10/05/2024) for follow up.    Health Maintenance, Female Adopting a healthy lifestyle and getting preventive care are important in promoting health and wellness. Ask your health care provider about: The right schedule for you to have regular tests and exams. Things you can do on your own to prevent diseases and keep yourself healthy. What should I know about diet, weight, and exercise? Eat a healthy diet  Eat a diet that includes plenty of vegetables, fruits, low-fat dairy products, and lean protein. Do not eat a lot of foods that are high in solid fats, added sugars, or sodium. Maintain a healthy weight Body mass index (BMI) is used to identify weight problems. It estimates body fat based on height and weight. Your health care provider can help determine your BMI and help you achieve or maintain a healthy weight. Get regular exercise Get regular exercise. This is one of the most important things you can do for your health. Most adults should: Exercise for at least 150 minutes each week. The exercise should increase your heart rate and make you sweat (moderate-intensity exercise). Do strengthening exercises at least twice a week. This is in addition to the moderate-intensity exercise. Spend less time sitting. Even light physical activity can be beneficial. Watch cholesterol and blood lipids Have your blood tested for lipids and cholesterol at 70 years of age, then have this test every 5 years. Have your cholesterol levels checked more often if: Your lipid or cholesterol levels are high. You are older than 70 years of age. You are at high risk for heart disease. What should I know about cancer screening? Depending on your health history and family history, you may need to have cancer screening at various ages. This may include screening  for: Breast cancer. Cervical cancer. Colorectal cancer. Skin cancer. Lung cancer. What should I know about heart disease, diabetes, and high blood pressure? Blood pressure and heart disease High blood pressure causes heart disease and increases the risk of stroke. This is more likely to develop in people who have high blood pressure readings or are overweight. Have your blood pressure checked: Every 3-5 years if you are 40-34 years of age. Every year if you are 69 years old or older. Diabetes Have regular diabetes screenings. This checks your fasting blood sugar level. Have the screening done: Once every three years after age 71 if you are at a normal weight and have a low risk for diabetes. More often and at a younger age if you are overweight or have a high risk for diabetes. What should I know about preventing infection? Hepatitis B If you have a higher risk for hepatitis B, you should be screened for this virus. Talk with your health care provider to find out if you are at risk for hepatitis B infection. Hepatitis C Testing is recommended for: Everyone born from [birth year] through 1966. Anyone with known risk factors for hepatitis C. Sexually transmitted infections (STIs) Get screened for STIs, including gonorrhea and chlamydia, if: You are sexually active and are younger than 70 years of age. You are older than 70 years of age and your health care provider tells you that you are at risk for this type of infection. Your sexual activity has changed since you were last screened, and you  are at increased risk for chlamydia or gonorrhea. Ask your health care provider if you are at risk. Ask your health care provider about whether you are at high risk for HIV. Your health care provider may recommend a prescription medicine to help prevent HIV infection. If you choose to take medicine to prevent HIV, you should first get tested for HIV. You should then be tested every 3 months for as long as you  are taking the medicine. Pregnancy If you are about to stop having your period (premenopausal) and you may become pregnant, seek counseling before you get pregnant. Take 400 to 800 micrograms (mcg) of folic acid every day if you become pregnant. Ask for birth control (contraception) if you want to prevent pregnancy. Osteoporosis and menopause Osteoporosis is a disease in which the bones lose minerals and strength with aging. This can result in bone fractures. If you are 8 years old or older, or if you are at risk for osteoporosis and fractures, ask your health care provider if you should: Be screened for bone loss. Take a calcium or vitamin D  supplement to lower your risk of fractures. Be given hormone replacement therapy (HRT) to treat symptoms of menopause. Follow these instructions at home: Alcohol use Do not drink alcohol if: Your health care provider tells you not to drink. You are pregnant, may be pregnant, or are planning to become pregnant. If you drink alcohol: Limit how much you have to: 0-1 drink a day. Know how much alcohol is in your drink. In the [place] one drink equals one 12 oz bottle of beer (355 mL), one 5 oz glass of wine (148 mL), or one 1 oz glass of hard liquor (44 mL). Lifestyle Do not use any products that contain nicotine or tobacco. These products include cigarettes, chewing tobacco, and vaping devices, such as e-cigarettes. If you need help quitting, ask your health care provider. Do not use street drugs. Do not share needles. Ask your health care provider for help if you need support or information about quitting drugs. General instructions Schedule regular health, dental, and eye exams. Stay current with your vaccines. Tell your health care provider if: You often feel depressed. You have ever been abused or do not feel safe at home. Summary Adopting a healthy lifestyle and getting preventive care are important in promoting health and wellness. Follow your  health care provider's instructions about healthy diet, exercising, and getting tested or screened for diseases. Follow your health care provider's instructions on monitoring your cholesterol and blood pressure. This information is not intended to replace advice given to you by your health care provider. Make sure you discuss any questions you have with your health care provider. Document Revised: 08/02/2020 Document Reviewed: 08/02/2020 Elsevier Patient Education  2024 [employer]. "

## 2024-04-07 ENCOUNTER — Ambulatory Visit: Admitting: Internal Medicine

## 2024-04-07 VITALS — BP 108/78 | HR 85 | Temp 98.1°F | Ht 67.0 in | Wt 203.0 lb

## 2024-04-07 DIAGNOSIS — Z8543 Personal history of malignant neoplasm of ovary: Secondary | ICD-10-CM | POA: Diagnosis not present

## 2024-04-07 DIAGNOSIS — F419 Anxiety disorder, unspecified: Secondary | ICD-10-CM | POA: Diagnosis not present

## 2024-04-07 DIAGNOSIS — Z9081 Acquired absence of spleen: Secondary | ICD-10-CM

## 2024-04-07 DIAGNOSIS — E063 Autoimmune thyroiditis: Secondary | ICD-10-CM | POA: Diagnosis not present

## 2024-04-07 DIAGNOSIS — E559 Vitamin D deficiency, unspecified: Secondary | ICD-10-CM

## 2024-04-07 DIAGNOSIS — R7303 Prediabetes: Secondary | ICD-10-CM

## 2024-04-07 DIAGNOSIS — G43009 Migraine without aura, not intractable, without status migrainosus: Secondary | ICD-10-CM | POA: Diagnosis not present

## 2024-04-07 MED ORDER — RIZATRIPTAN BENZOATE 5 MG PO TABS: 5.0000 mg | ORAL_TABLET | ORAL | 11 refills | Status: AC | PRN

## 2024-04-07 NOTE — Assessment & Plan Note (Signed)
 Chronic Lab Results  Component Value Date   HGBA1C 6.1 04/20/2023   Check a1c Low sugar / carb diet Stressed regular exercise

## 2024-04-07 NOTE — Assessment & Plan Note (Signed)
 Chronic Taking vitamin d daily Check vitamin d level

## 2024-04-07 NOTE — Assessment & Plan Note (Addendum)
 History of splenectomy Overdue for meningitis vaccines-Will need meningitis A, B vaccines She will have to go to one of the [facility] health pharmacies since she is on Medicare-vaccines ordered and sent to [facility] Due for pneumonia booster next year

## 2024-04-07 NOTE — Assessment & Plan Note (Addendum)
 Chronic Thyroid  antibodies positive in  2018 Clinically euthyroid Check tsh and will titrate med dose if needed Continue levothyroxine  25 mcg daily

## 2024-04-07 NOTE — Assessment & Plan Note (Addendum)
 Improved Was getting very anxious when driving on the highway or when there was a lot of traffic Doing great on sertraline  100 mg daily Anxiety very well-controlled Continue sertraline  100 mg daily

## 2024-04-07 NOTE — Assessment & Plan Note (Signed)
 Following with gynecology-oncology on a regular basis No evidence of recurrence

## 2024-04-07 NOTE — Assessment & Plan Note (Addendum)
 Chronic Controlled Often triggered by weather Continue maxalt  5 mg prn Taking excedrin  migraine as needed

## 2024-04-08 ENCOUNTER — Other Ambulatory Visit

## 2024-04-08 DIAGNOSIS — R7303 Prediabetes: Secondary | ICD-10-CM | POA: Diagnosis not present

## 2024-04-08 DIAGNOSIS — E063 Autoimmune thyroiditis: Secondary | ICD-10-CM | POA: Diagnosis not present

## 2024-04-08 DIAGNOSIS — E559 Vitamin D deficiency, unspecified: Secondary | ICD-10-CM

## 2024-04-08 LAB — TSH: TSH: 3.32 u[IU]/mL (ref 0.35–5.50)

## 2024-04-08 LAB — CBC
HCT: 42.4 % (ref 36.0–46.0)
Hemoglobin: 14.3 g/dL (ref 12.0–15.0)
MCHC: 33.8 g/dL (ref 30.0–36.0)
MCV: 94.4 fl (ref 78.0–100.0)
Platelets: 316 K/uL (ref 150.0–400.0)
RBC: 4.49 Mil/uL (ref 3.87–5.11)
RDW: 13.6 % (ref 11.5–15.5)
WBC: 7.3 K/uL (ref 4.0–10.5)

## 2024-04-08 LAB — COMPREHENSIVE METABOLIC PANEL WITH GFR
ALT: 33 U/L (ref 3–35)
AST: 28 U/L (ref 5–37)
Albumin: 4.3 g/dL (ref 3.5–5.2)
Alkaline Phosphatase: 74 U/L (ref 39–117)
BUN: 14 mg/dL (ref 6–23)
CO2: 26 meq/L (ref 19–32)
Calcium: 9.2 mg/dL (ref 8.4–10.5)
Chloride: 100 meq/L (ref 96–112)
Creatinine, Ser: 0.78 mg/dL (ref 0.40–1.20)
GFR: 77.59 mL/min
Glucose, Bld: 91 mg/dL (ref 70–99)
Potassium: 4.2 meq/L (ref 3.5–5.1)
Sodium: 135 meq/L (ref 135–145)
Total Bilirubin: 0.4 mg/dL (ref 0.2–1.2)
Total Protein: 8.2 g/dL (ref 6.0–8.3)

## 2024-04-08 LAB — LIPID PANEL
Cholesterol: 198 mg/dL (ref 28–200)
HDL: 49.3 mg/dL
LDL Cholesterol: 125 mg/dL — ABNORMAL HIGH (ref 10–99)
NonHDL: 148.45
Total CHOL/HDL Ratio: 4
Triglycerides: 117 mg/dL (ref 10.0–149.0)
VLDL: 23.4 mg/dL (ref 0.0–40.0)

## 2024-04-08 LAB — VITAMIN D 25 HYDROXY (VIT D DEFICIENCY, FRACTURES): VITD: 24.29 ng/mL — ABNORMAL LOW (ref 30.00–100.00)

## 2024-04-08 LAB — HEMOGLOBIN A1C: Hgb A1c MFr Bld: 5.9 % (ref 4.6–6.5)

## 2024-04-10 ENCOUNTER — Other Ambulatory Visit: Payer: Self-pay | Admitting: Internal Medicine

## 2024-04-10 ENCOUNTER — Ambulatory Visit: Payer: Self-pay | Admitting: Internal Medicine

## 2024-04-10 ENCOUNTER — Inpatient Hospital Stay: Admission: RE | Admit: 2024-04-10 | Discharge: 2024-04-10 | Attending: Internal Medicine

## 2024-04-10 DIAGNOSIS — Z1231 Encounter for screening mammogram for malignant neoplasm of breast: Secondary | ICD-10-CM

## 2024-04-21 ENCOUNTER — Ambulatory Visit: Payer: Medicare Other

## 2024-04-21 ENCOUNTER — Ambulatory Visit (HOSPITAL_BASED_OUTPATIENT_CLINIC_OR_DEPARTMENT_OTHER): Payer: Medicare Other | Admitting: Obstetrics & Gynecology

## 2024-04-24 ENCOUNTER — Encounter (HOSPITAL_BASED_OUTPATIENT_CLINIC_OR_DEPARTMENT_OTHER): Payer: Self-pay | Admitting: Obstetrics & Gynecology

## 2024-04-24 ENCOUNTER — Ambulatory Visit (INDEPENDENT_AMBULATORY_CARE_PROVIDER_SITE_OTHER): Admitting: Obstetrics & Gynecology

## 2024-04-24 VITALS — BP 128/70 | HR 70 | Ht 67.5 in | Wt 200.4 lb

## 2024-04-24 DIAGNOSIS — Z9189 Other specified personal risk factors, not elsewhere classified: Secondary | ICD-10-CM | POA: Diagnosis not present

## 2024-04-24 DIAGNOSIS — M85851 Other specified disorders of bone density and structure, right thigh: Secondary | ICD-10-CM | POA: Diagnosis not present

## 2024-04-24 DIAGNOSIS — M85852 Other specified disorders of bone density and structure, left thigh: Secondary | ICD-10-CM

## 2024-04-24 DIAGNOSIS — Z8543 Personal history of malignant neoplasm of ovary: Secondary | ICD-10-CM | POA: Diagnosis not present

## 2024-04-24 DIAGNOSIS — Z9081 Acquired absence of spleen: Secondary | ICD-10-CM

## 2024-04-24 DIAGNOSIS — Z90710 Acquired absence of both cervix and uterus: Secondary | ICD-10-CM | POA: Diagnosis not present

## 2024-04-24 NOTE — Patient Instructions (Signed)
 Pentavalent meningitis vaccination

## 2024-04-24 NOTE — Progress Notes (Signed)
 "  Breast and Pelvic Exam Patient name: Shelley Sanchez MRN [id number]  Date of birth: 06-21-1955 Chief Complaint:   Gynecologic Exam  History of Present Illness:   Shelley Sanchez is a 69 y.o. G39P3000 Caucasian female being seen today for breast and pelvic exam.  Denies vaginal bleeding.  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    Discussed vaccines with pt due to h/o splenectomy.  She needs meningitis vaccine updated.  Order has been sent to [facility].    No LMP recorded. Patient has had a hysterectomy.  Last pap: Hysterectomy. H/O abnormal pap: no Last mammogram: 04/10/2024. Results were: normal. Family h/o breast cancer: no Last colonoscopy: 04/03/2024. Results were: normal. Family h/o colorectal cancer: yes mother.  Follow up 5 years.   DEXA:  scheduled 05/27/2024     04/07/2024    2:19 PM 04/20/2023   10:24 AM 04/20/2022    1:16 PM 04/18/2021    2:46 PM 04/14/2019   11:01 AM  Depression screen PHQ 2/9  Decreased Interest 0 0 0 0 0  Down, Depressed, Hopeless 0 0 0 0 0  PHQ - 2 Score 0 0 0 0 0  Altered sleeping 0 1  1   Tired, decreased energy 0 0  0   Change in appetite 0 2  0   Feeling bad or failure about yourself  0 0  0   Trouble concentrating 0 0  0   Moving slowly or fidgety/restless 0 0  0   Suicidal thoughts 0 0  0   PHQ-9 Score 0 3   1    Difficult doing work/chores Not difficult at all Not difficult at all  Not difficult at all      Data saved with a previous flowsheet row definition        04/18/2021    2:47 PM  GAD 7 : Generalized Anxiety Score  Nervous, Anxious, on Edge 1   Control/stop worrying 0   Worry too much - different things 0   Trouble relaxing 0   Restless 0   Easily annoyed or irritable 0   Afraid - awful might happen 0   Total GAD 7 Score 1  Anxiety Difficulty Not difficult at all     Data saved  with a previous flowsheet row definition     Review of Systems:   Pertinent items are noted in HPI Denies any urinary or bowel changes.  Denies pelvic pain.   Pertinent History Reviewed:  Reviewed past medical,surgical, social and family history.  Reviewed problem list, medications and allergies. Physical Assessment:   Vitals:   04/24/24 0953  BP: 128/70  Pulse: 70  SpO2: 95%  Weight: 200 lb 6.4 oz (90.9 kg)  Height: 5' 7.5 (1.715 m)  Body mass index is 30.92 kg/m.        Physical Examination:   General appearance - well appearing, and in no distress  Mental status - alert, oriented to person, place, and time  Psych:  She has a normal mood and affect  Skin - warm and dry, normal color, no suspicious lesions noted  Chest - effort normal, all lung fields clear to auscultation bilaterally  Heart - normal rate and regular rhythm  Neck:  midline trachea, no thyromegaly or nodules  Breasts - breasts appear normal, no suspicious masses, no skin or nipple changes or  axillary nodes  Abdomen - soft, nontender, nondistended, no masses or organomegaly  Pelvic - VULVA: normal appearing vulva with no masses, tenderness or lesions   VAGINA: vaginal atrophic changes, no lesions CERVIX: surgically absent  Thin prep pap is not indicated today  UTERUS: surgically absent  ADNEXA: No adnexal masses or tenderness noted.  Rectal - normal rectal, good sphincter tone, no masses felt  Extremities:  No swelling or varicosities noted  Chaperone present for exam  No results found for this or any previous visit (from the past 24 hours).  Assessment & Plan:  1. GYN exam for high-risk Medicare patient (Primary) - Pap smear not indicated.  Normal pelvic exam today. - Mammogram 04/15/2024 - Colonoscopy 04/03/2024 - Bone mineral density scheduled 05/27/2024 - lab work done with PCP, Dr. Geofm - vaccines reviewed/updated  2. History of ovarian cancer - CA 125  3. Status post splenectomy - reviewed  vaccines.  She needs meningitis vaccination.  Information given.    4. Osteopenia of necks of both femurs - Dexa scheduled.  Taking Vit D 1000IU weekly.  5. H/O: hysterectomy   Orders Placed This Encounter  Procedures   CA 125    Meds: No orders of the defined types were placed in this encounter.   Follow-up: No follow-ups on file.  Ronal GORMAN Pinal, MD 04/24/2024 10:58 AM "

## 2024-04-25 LAB — CA 125: Cancer Antigen (CA) 125: 33.2 U/mL (ref 0.0–38.1)

## 2024-04-27 ENCOUNTER — Ambulatory Visit (HOSPITAL_BASED_OUTPATIENT_CLINIC_OR_DEPARTMENT_OTHER): Payer: Self-pay | Admitting: Obstetrics & Gynecology

## 2024-05-27 ENCOUNTER — Other Ambulatory Visit (HOSPITAL_BASED_OUTPATIENT_CLINIC_OR_DEPARTMENT_OTHER)

## 2024-10-06 ENCOUNTER — Ambulatory Visit: Admitting: Internal Medicine
